# Patient Record
Sex: Male | Born: 1935 | Race: White | Hispanic: No | Smoking: Former smoker
Health system: Southern US, Community
[De-identification: ages and names within clinical notes are randomized; demographics above are authoritative.]

## PROBLEM LIST (undated history)

## (undated) DIAGNOSIS — G43909 Migraine, unspecified, not intractable, without status migrainosus: Secondary | ICD-10-CM

## (undated) DIAGNOSIS — I209 Angina pectoris, unspecified: Secondary | ICD-10-CM

## (undated) DIAGNOSIS — G2581 Restless legs syndrome: Secondary | ICD-10-CM

## (undated) DIAGNOSIS — H8109 Meniere's disease, unspecified ear: Secondary | ICD-10-CM

## (undated) DIAGNOSIS — I10 Essential (primary) hypertension: Secondary | ICD-10-CM

## (undated) DIAGNOSIS — I4819 Other persistent atrial fibrillation: Secondary | ICD-10-CM

## (undated) DIAGNOSIS — E119 Type 2 diabetes mellitus without complications: Secondary | ICD-10-CM

## (undated) DIAGNOSIS — M545 Low back pain, unspecified: Secondary | ICD-10-CM

## (undated) DIAGNOSIS — K279 Peptic ulcer, site unspecified, unspecified as acute or chronic, without hemorrhage or perforation: Secondary | ICD-10-CM

## (undated) DIAGNOSIS — I2510 Atherosclerotic heart disease of native coronary artery without angina pectoris: Secondary | ICD-10-CM

## (undated) DIAGNOSIS — I517 Cardiomegaly: Secondary | ICD-10-CM

## (undated) DIAGNOSIS — E669 Obesity, unspecified: Secondary | ICD-10-CM

## (undated) DIAGNOSIS — Z8701 Personal history of pneumonia (recurrent): Secondary | ICD-10-CM

## (undated) DIAGNOSIS — J45909 Unspecified asthma, uncomplicated: Secondary | ICD-10-CM

## (undated) DIAGNOSIS — G4733 Obstructive sleep apnea (adult) (pediatric): Secondary | ICD-10-CM

## (undated) DIAGNOSIS — E7800 Pure hypercholesterolemia, unspecified: Secondary | ICD-10-CM

## (undated) DIAGNOSIS — K219 Gastro-esophageal reflux disease without esophagitis: Secondary | ICD-10-CM

## (undated) DIAGNOSIS — I509 Heart failure, unspecified: Secondary | ICD-10-CM

## (undated) DIAGNOSIS — G8929 Other chronic pain: Secondary | ICD-10-CM

## (undated) DIAGNOSIS — Z8719 Personal history of other diseases of the digestive system: Secondary | ICD-10-CM

## (undated) DIAGNOSIS — N200 Calculus of kidney: Secondary | ICD-10-CM

## (undated) DIAGNOSIS — M1990 Unspecified osteoarthritis, unspecified site: Secondary | ICD-10-CM

## (undated) HISTORY — DX: Other persistent atrial fibrillation: I48.19

## (undated) HISTORY — DX: Cardiomegaly: I51.7

## (undated) HISTORY — PX: HEMORRHOID SURGERY: SHX153

## (undated) HISTORY — PX: INGUINAL HERNIA REPAIR: SUR1180

## (undated) HISTORY — DX: Obstructive sleep apnea (adult) (pediatric): G47.33

## (undated) HISTORY — DX: Restless legs syndrome: G25.81

## (undated) HISTORY — PX: ABLATION OF DYSRHYTHMIC FOCUS: SHX254

## (undated) HISTORY — DX: Obesity, unspecified: E66.9

## (undated) HISTORY — DX: Migraine, unspecified, not intractable, without status migrainosus: G43.909

---
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
## 1972-01-14 HISTORY — PX: KNEE ARTHROSCOPY WITH MENISCAL REPAIR: SHX5653

## 1981-01-13 HISTORY — PX: NISSEN FUNDOPLICATION: SHX2091

## 1981-01-13 HISTORY — PX: HIATAL HERNIA REPAIR: SHX195

## 1990-01-13 HISTORY — PX: WRIST FRACTURE SURGERY: SHX121

## 1999-08-19 HISTORY — PX: TOTAL KNEE ARTHROPLASTY: SHX125

## 2003-08-30 ENCOUNTER — Ambulatory Visit (HOSPITAL_BASED_OUTPATIENT_CLINIC_OR_DEPARTMENT_OTHER): Admission: RE | Admit: 2003-08-30 | Discharge: 2003-08-30 | Payer: Self-pay | Admitting: Surgery

## 2003-08-30 ENCOUNTER — Ambulatory Visit (HOSPITAL_COMMUNITY): Admission: RE | Admit: 2003-08-30 | Discharge: 2003-08-30 | Payer: Self-pay | Admitting: Surgery

## 2004-01-14 HISTORY — PX: COLONOSCOPY W/ POLYPECTOMY: SHX1380

## 2005-02-21 ENCOUNTER — Inpatient Hospital Stay (HOSPITAL_COMMUNITY): Admission: EM | Admit: 2005-02-21 | Discharge: 2005-03-03 | Payer: Self-pay | Admitting: Emergency Medicine

## 2007-01-14 HISTORY — PX: CATARACT EXTRACTION W/ INTRAOCULAR LENS  IMPLANT, BILATERAL: SHX1307

## 2011-09-29 ENCOUNTER — Other Ambulatory Visit: Payer: Self-pay | Admitting: Interventional Cardiology

## 2011-10-02 ENCOUNTER — Encounter (HOSPITAL_COMMUNITY)
Admission: RE | Disposition: A | Discharge status: Home / Self Care | Payer: Self-pay | Source: Ambulatory Visit | Attending: Interventional Cardiology

## 2011-10-02 ENCOUNTER — Encounter (HOSPITAL_COMMUNITY): Payer: Self-pay

## 2011-10-02 ENCOUNTER — Encounter (HOSPITAL_COMMUNITY): Payer: Self-pay | Admitting: Anesthesiology

## 2011-10-02 ENCOUNTER — Ambulatory Visit (HOSPITAL_COMMUNITY): Payer: Medicare HMO | Admitting: Anesthesiology

## 2011-10-02 ENCOUNTER — Ambulatory Visit (HOSPITAL_COMMUNITY)
Admission: RE | Admit: 2011-10-02 | Discharge: 2011-10-02 | Disposition: A | Discharge status: Home / Self Care | Payer: Medicare HMO | Source: Ambulatory Visit | Attending: Interventional Cardiology | Admitting: Interventional Cardiology

## 2011-10-02 DIAGNOSIS — I4891 Unspecified atrial fibrillation: Secondary | ICD-10-CM | POA: Insufficient documentation

## 2011-10-02 HISTORY — PX: CARDIOVERSION: SHX1299

## 2011-10-02 HISTORY — DX: Essential (primary) hypertension: I10

## 2011-10-02 SURGERY — CARDIOVERSION
Anesthesia: Monitor Anesthesia Care

## 2011-10-02 MED ORDER — SODIUM CHLORIDE 0.9 % IV SOLN
INTRAVENOUS | Status: DC
  Administered 2011-10-02: 11:00:00 via INTRAVENOUS

## 2011-10-02 MED ORDER — SODIUM CHLORIDE 0.9 % IV SOLN
INTRAVENOUS | Status: DC | PRN
  Administered 2011-10-02: 11:00:00 via INTRAVENOUS

## 2011-10-02 MED ORDER — PROPOFOL 10 MG/ML IV BOLUS
INTRAVENOUS | Status: DC | PRN
  Administered 2011-10-02: 40 mg via INTRAVENOUS

## 2011-10-02 MED ORDER — LIDOCAINE HCL (CARDIAC) 20 MG/ML IV SOLN
INTRAVENOUS | Status: DC | PRN
  Administered 2011-10-02: 50 mg via INTRAVENOUS

## 2011-10-02 NOTE — Transfer of Care (Signed)
Immediate Anesthesia Transfer of Care Note  Patient: Tim Short  Procedure(s) Performed: Procedure(s) (LRB) with comments: CARDIOVERSION (N/A) - h/p in file drawer  Patient Location: PACU  Anesthesia Type: General  Level of Consciousness: awake, alert  and patient cooperative  Airway & Oxygen Therapy: Patient Spontanous Breathing and Patient connected to face mask oxygen  Post-op Assessment: Report given to PACU RN, Post -op Vital signs reviewed and stable and Patient moving all extremities  Post vital signs: Reviewed and stable  Complications: No apparent anesthesia complications

## 2011-10-02 NOTE — CV Procedure (Signed)
Electrical Cardioversion Procedure Note Madden Garron [id number] 1934/06/16  Procedure: Electrical Cardioversion Indications:  Atrial Fibrillation  Time Out: Verified patient identification, verified procedure,medications/allergies/relevent history reviewed, required imaging and test results available.  Performed  Procedure Details  The patient was NPO after midnight. Anesthesia was administered at the beside  by Dr. Chaney Malling with 40mg  of propofol.  Cardioversion was done with synchronized biphasic defibrillation with AP pads with 120 J, and then 150 J.  The patient converted to normal sinus rhythm after the second shock. The patient tolerated the procedure well   IMPRESSION:  Successful cardioversion to NSR.  Continue Eliquis for at least a month and likely beyond.  Given sinus bradycardia, will hold propranolol post procedure.  Continue diltiazem.    VARANASI,JAYADEEP S. 10/02/2011, 12:31 PM

## 2011-10-02 NOTE — Anesthesia Preprocedure Evaluation (Addendum)
Anesthesia Evaluation  Patient identified by MRN, date of birth, ID band  Reviewed: Allergy & Precautions, H&P , NPO status , Patient's Chart, lab work & pertinent test results, reviewed documented beta blocker date and time   Airway Mallampati: II TM Distance: >3 FB Neck ROM: Full    Dental  (+) Partial Upper, Partial Lower and Missing   Pulmonary          Cardiovascular hypertension, Pt. on home beta blockers and Pt. on medications + dysrhythmias Atrial Fibrillation     Neuro/Psych    GI/Hepatic   Endo/Other    Renal/GU      Musculoskeletal   Abdominal   Peds  Hematology   Anesthesia Other Findings   Reproductive/Obstetrics                          Anesthesia Physical Anesthesia Plan  ASA: II  Anesthesia Plan: MAC   Post-op Pain Management:    Induction: Intravenous  Airway Management Planned: Mask  Additional Equipment:   Intra-op Plan:   Post-operative Plan:   Informed Consent: I have reviewed the patients History and Physical, chart, labs and discussed the procedure including the risks, benefits and alternatives for the proposed anesthesia with the patient or authorized representative who has indicated his/her understanding and acceptance.     Plan Discussed with: CRNA  Anesthesia Plan Comments:         Anesthesia Quick Evaluation

## 2011-10-02 NOTE — Preoperative (Signed)
Beta Blockers   Reason not to administer Beta Blockers:Inderal 9 a.m.today

## 2011-10-02 NOTE — H&P (Signed)
  Date of Initial H&P: 09/23/11  History reviewed, patient examined, no change in status, stable for cardioversion.

## 2011-10-03 ENCOUNTER — Encounter (HOSPITAL_COMMUNITY): Payer: Self-pay | Admitting: Interventional Cardiology

## 2011-10-03 NOTE — Anesthesia Postprocedure Evaluation (Signed)
Anesthesia Post Note  Patient: Tim Short  Procedure(s) Performed: Procedure(s) (LRB): CARDIOVERSION (N/A)  Anesthesia type: MAC  Patient location: PACU  Post pain: Pain level controlled and Adequate analgesia  Post assessment: Post-op Vital signs reviewed, Patient's Cardiovascular Status Stable and Respiratory Function Stable  Last Vitals:  Filed Vitals:   10/02/11 1410  BP: 103/60  Temp:   Resp: 16    Post vital signs: Reviewed and stable  Level of consciousness: awake, alert  and oriented  Complications: No apparent anesthesia complications

## 2011-11-05 ENCOUNTER — Other Ambulatory Visit: Payer: Self-pay | Admitting: Interventional Cardiology

## 2011-11-07 ENCOUNTER — Ambulatory Visit (HOSPITAL_COMMUNITY)
Admission: RE | Admit: 2011-11-07 | Discharge: 2011-11-07 | Disposition: A | Discharge status: Home / Self Care | Payer: Medicare HMO | Source: Ambulatory Visit | Attending: Interventional Cardiology | Admitting: Interventional Cardiology

## 2011-11-07 ENCOUNTER — Encounter (HOSPITAL_COMMUNITY): Payer: Self-pay | Admitting: *Deleted

## 2011-11-07 ENCOUNTER — Ambulatory Visit (HOSPITAL_COMMUNITY): Payer: Medicare HMO | Admitting: *Deleted

## 2011-11-07 ENCOUNTER — Encounter (HOSPITAL_COMMUNITY)
Admission: RE | Disposition: A | Discharge status: Home / Self Care | Payer: Self-pay | Source: Ambulatory Visit | Attending: Interventional Cardiology

## 2011-11-07 DIAGNOSIS — I10 Essential (primary) hypertension: Secondary | ICD-10-CM | POA: Insufficient documentation

## 2011-11-07 DIAGNOSIS — I4891 Unspecified atrial fibrillation: Secondary | ICD-10-CM

## 2011-11-07 HISTORY — DX: Pure hypercholesterolemia, unspecified: E78.00

## 2011-11-07 HISTORY — DX: Meniere's disease, unspecified ear: H81.09

## 2011-11-07 HISTORY — PX: CARDIOVERSION: SHX1299

## 2011-11-07 HISTORY — DX: Unspecified asthma, uncomplicated: J45.909

## 2011-11-07 SURGERY — CARDIOVERSION
Anesthesia: Monitor Anesthesia Care

## 2011-11-07 MED ORDER — AMIODARONE HCL 400 MG PO TABS: 400.0000 mg | ORAL_TABLET | Freq: Every day | ORAL | Status: DC

## 2011-11-07 MED ORDER — PROPOFOL 10 MG/ML IV BOLUS
INTRAVENOUS | Status: DC | PRN
  Administered 2011-11-07: 140 mg via INTRAVENOUS

## 2011-11-07 MED ORDER — SODIUM CHLORIDE 0.9 % IV SOLN
INTRAVENOUS | Status: DC | PRN
  Administered 2011-11-07: 11:00:00 via INTRAVENOUS

## 2011-11-07 MED ORDER — SODIUM CHLORIDE 0.9 % IV SOLN: INTRAVENOUS | Status: DC

## 2011-11-07 NOTE — Preoperative (Signed)
Beta Blockers   Reason not to administer Beta Blockers:Not Applicable 

## 2011-11-07 NOTE — Transfer of Care (Signed)
Immediate Anesthesia Transfer of Care Note  Patient: Tim Short  Procedure(s) Performed: Procedure(s) (LRB) with comments: CARDIOVERSION (N/A) - h/p from 10/22 in file drawer/dl  Patient Location: PACU and Endoscopy Unit  Anesthesia Type: General  Level of Consciousness: awake, alert  and oriented  Airway & Oxygen Therapy: Patient Spontanous Breathing and Patient connected to nasal cannula oxygen  Post-op Assessment: Report given to PACU RN  Post vital signs: Reviewed and stable  Complications: No apparent anesthesia complications

## 2011-11-07 NOTE — Anesthesia Preprocedure Evaluation (Addendum)
Anesthesia Evaluation  Patient identified by MRN, date of birth, ID band Patient awake    Reviewed: Allergy & Precautions, H&P , NPO status , Patient's Chart, lab work & pertinent test results  Airway Mallampati: II TM Distance: >3 FB Neck ROM: Full    Dental  (+) Teeth Intact,    Pulmonary asthma ,  breath sounds clear to auscultation        Cardiovascular hypertension, + dysrhythmias Atrial Fibrillation Rhythm:Irregular Rate:Abnormal  Pre op EKG reviewed--A fib @ 102; EKG 11/04/11   Neuro/Psych  Headaches, negative psych ROS   GI/Hepatic negative GI ROS, Neg liver ROS,   Endo/Other  negative endocrine ROS  Renal/GU negative Renal ROS     Musculoskeletal negative musculoskeletal ROS (+)   Abdominal Normal abdominal exam  (+)   Peds  Hematology negative hematology ROS (+)   Anesthesia Other Findings Pre op labs--CBC, PT/INR, BMET reviewed.  Samples drawn 11/04/11.  Reproductive/Obstetrics                          Anesthesia Physical Anesthesia Plan  ASA: III  Anesthesia Plan: General   Post-op Pain Management:    Induction: Intravenous  Airway Management Planned: Mask  Additional Equipment:   Intra-op Plan:   Post-operative Plan:   Informed Consent: I have reviewed the patients History and Physical, chart, labs and discussed the procedure including the risks, benefits and alternatives for the proposed anesthesia with the patient or authorized representative who has indicated his/her understanding and acceptance.   Dental advisory given  Plan Discussed with: CRNA, Anesthesiologist and Surgeon  Anesthesia Plan Comments:         Anesthesia Quick Evaluation

## 2011-11-07 NOTE — CV Procedure (Signed)
Electrical Cardioversion Procedure Note Tim Short [id number] 1934-10-22  Procedure: Electrical Cardioversion Indications:  Atrial Fibrillation  Time Out: Verified patient identification, verified procedure,medications/allergies/relevent history reviewed, required imaging and test results available.  Performed  Procedure Details  The patient was NPO after midnight. Anesthesia was administered at the beside  by Dr. Gelene Mink with 140 mg of propofol.  Cardioversion was done with synchronized biphasic defibrillation with AP pads with 120 Joules.  The patient converted to normal sinus rhythm. The patient tolerated the procedure well   IMPRESSION:  Successful cardioversion of atrial fibrillation.  Continue Eliquis for at least a month.    VARANASI,JAYADEEP S. 11/07/2011, 11:24 AM

## 2011-11-07 NOTE — H&P (Signed)
Date of Initial H&P: 11/04/11  History reviewed, patient examined, no change in status, stable for surgery.

## 2011-11-07 NOTE — Anesthesia Postprocedure Evaluation (Signed)
  Anesthesia Post-op Note  Patient: Tim Short  Procedure(s) Performed: Procedure(s) (LRB) with comments: CARDIOVERSION (N/A) - h/p from 10/22 in file drawer/dl  Patient Location: PACU  Anesthesia Type: General  Level of Consciousness: awake, alert  and oriented  Airway and Oxygen Therapy: Patient Spontanous Breathing and Patient connected to nasal cannula oxygen  Post-op Pain: none  Post-op Assessment: Post-op Vital signs reviewed  Post-op Vital Signs: Reviewed and stable  Complications: No apparent anesthesia complications

## 2011-11-07 NOTE — Progress Notes (Signed)
Cardioversion 150 joules at 11:22.  Converted to sinus rhythm.

## 2011-11-10 ENCOUNTER — Encounter (HOSPITAL_COMMUNITY): Payer: Self-pay | Admitting: Interventional Cardiology

## 2012-04-09 ENCOUNTER — Encounter: Payer: Self-pay | Admitting: Internal Medicine

## 2012-04-09 ENCOUNTER — Ambulatory Visit (INDEPENDENT_AMBULATORY_CARE_PROVIDER_SITE_OTHER): Payer: Medicare HMO | Admitting: Internal Medicine

## 2012-04-09 VITALS — BP 121/76 | HR 73 | Ht 70.0 in | Wt 241.2 lb

## 2012-04-09 DIAGNOSIS — G4733 Obstructive sleep apnea (adult) (pediatric): Secondary | ICD-10-CM

## 2012-04-09 DIAGNOSIS — I10 Essential (primary) hypertension: Secondary | ICD-10-CM

## 2012-04-09 DIAGNOSIS — I4891 Unspecified atrial fibrillation: Secondary | ICD-10-CM

## 2012-04-09 NOTE — Progress Notes (Signed)
Primary Care Physician: Daisy Floro, MD Referring Physician:  Dr Philis Kendall Onofre Tim Short is a 78 y.o. male with a h/o persistent atrial fibrillation who presents today for EP consultation.  He reports that he was initially diagnosed with atrial fibrillation in 2013 after presenting with progressive fatigue and weakness.  He underwent cardioversion 9/13.   He reports that after 3 days, he returned to atrial fibrillation.  He was placed on amiodarone and underwent repeat cardioversion 10/13.  He reports that he was back in afib within a week.  He has been on amiodarone since October but remains in afib.    Today, he denies symptoms of palpitations, chest pain, shortness of breath, orthopnea, PND,   presyncope, syncope, or neurologic sequela.  He has stable pedal edema.  He has dizziness which he attributes to meniere's disease.  The patient is tolerating medications without difficulties and is otherwise without complaint today.   Past Medical History  Diagnosis Date  . Hypertension   . Hypercholesteremia   . Asthma   . Meniere disease   . Persistent atrial fibrillation   . Obesity   . Biatrial enlargement     LA size 5.3cm  . Migraines   . Obstructive sleep apnea     uses CPAP   Past Surgical History  Procedure Laterality Date  . Wrist surgery  1992  . Knee surgery  2001  . Cataract extraction  2009  . Hernia repair  2000 and 2005  . Colonoscopy w/ polypectomy  2006  . Total knee arthroplasty  2001  . Cardioversion  10/02/2011    Procedure: CARDIOVERSION;  Surgeon: Corky Crafts, MD;  Location: [facility] ENDOSCOPY;  Service: Cardiovascular;  Laterality: N/A;  h/p in file drawer  . Cardioversion  11/07/2011    Procedure: CARDIOVERSION;  Surgeon: Corky Crafts, MD;  Location: [facility] ENDOSCOPY;  Service: Cardiovascular;  Laterality: N/A;  h/p from 10/22 in file drawer/dl    Current Outpatient Prescriptions  Medication Sig Dispense Refill  . acetaminophen (TYLENOL)  500 MG tablet Take 500 mg by mouth every 6 (six) hours as needed.      Marland Kitchen albuterol (PROVENTIL HFA;VENTOLIN HFA) 108 (90 BASE) MCG/ACT inhaler Inhale 2 puffs into the lungs every 6 (six) hours as needed.      Marland Kitchen amiodarone (PACERONE) 400 MG tablet Take 1 tablet (400 mg total) by mouth daily.      Marland Kitchen amitriptyline (ELAVIL) 50 MG tablet Take 50 mg by mouth at bedtime.      Marland Kitchen apixaban (ELIQUIS) 5 MG TABS tablet Take by mouth 2 (two) times daily.      Marland Kitchen atorvastatin (LIPITOR) 10 MG tablet Take 10 mg by mouth daily.      Marland Kitchen diltiazem (CARDIZEM CD) 180 MG 24 hr capsule Take 180 mg by mouth daily.      . meclizine (ANTIVERT) 25 MG tablet Take 25 mg by mouth 3 (three) times daily as needed.      . montelukast (SINGULAIR) 10 MG tablet Take 10 mg by mouth at bedtime.      . Potassium Chloride Crys CR (POTASSIUM CHLORIDE CRYS ER PO) Take by mouth.      Marland Kitchen rOPINIRole (REQUIP) 1 MG tablet Take 1 mg by mouth 2 times daily at 12 noon and 4 pm.      . losartan-hydrochlorothiazide (HYZAAR) 100-25 MG per tablet Take 1 tablet by mouth daily.       No current facility-administered medications for this  visit.    Allergies  Allergen Reactions  . Corticosteroids     Inhaled Corticosteroids--Hoarseness, dry mouth    History   Social History  . Marital Status: Divorced    Spouse Name: N/A    Number of Children: N/A  . Years of Education: N/A   Occupational History  . Not on file.   Social History Main Topics  . Smoking status: Former Smoker    Types: Pipe  . Smokeless tobacco: Former Neurosurgeon    Quit date: 10/16/1979  . Alcohol Use: No  . Drug Use: No  . Sexually Active:    Other Topics Concern  . Not on file   Social History Narrative   Lives in [place] alone.  Divorced.   Retired Oncologist in [place]    Family History  Problem Relation Age of Onset  . Congestive Heart Failure      ROS- All systems are reviewed and negative except as per the HPI above  Physical Exam: Filed  Vitals:   04/09/12 0857  BP: 121/76  Pulse: 73  Height: 5\' 10"  (1.778 m)  Weight: 241 lb 3.2 oz (109.408 kg)    GEN- The patient is well appearing, alert and oriented x 3 today.   Head- normocephalic, atraumatic Eyes-  Sclera clear, conjunctiva pink Ears- hearing intact Oropharynx- clear Neck- supple, no JVP Lymph- no cervical lymphadenopathy Lungs- Clear to ausculation bilaterally, normal work of breathing Heart- Regular rate and rhythm, no murmurs, rubs or gallops, PMI not laterally displaced GI- soft, NT, ND, + BS Extremities- no clubbing, cyanosis, or edema MS- no significant deformity or atrophy Skin- no rash or lesion Psych- euthymic mood, full affect Neuro- strength and sensation are intact  EKG today reveals afib, V rate 72 bpm, nonspecifc ST/ T changes Echo reveals LA size 53 mm  Assessment and Plan:

## 2012-04-09 NOTE — Patient Instructions (Addendum)
Will refer to [facility] to Dr Hurman Horn ----they will call you for your appointment

## 2012-04-10 DIAGNOSIS — G4733 Obstructive sleep apnea (adult) (pediatric): Secondary | ICD-10-CM | POA: Insufficient documentation

## 2012-04-10 DIAGNOSIS — I10 Essential (primary) hypertension: Secondary | ICD-10-CM | POA: Insufficient documentation

## 2012-04-10 NOTE — Assessment & Plan Note (Signed)
Compliance with CPAP is encouraged   

## 2012-04-10 NOTE — Assessment & Plan Note (Signed)
The patient has persistent atrial fibrillation with symptoms refractory to medical therapy.  He has severe LA enlargement.  Therapeutic strategies for afib including medicine and ablation were discussed in detail with the patient today. Given severe LA enlargement, I think that anticipated success rates from an endocardial ablation are low.  I would favor a more aggressive approach with either a surgical maze or convergent procedure.   After long discussion, he is willing to go to [facility] for consultation of the convergent procedure. I will therefore assist with this.

## 2012-04-10 NOTE — Assessment & Plan Note (Signed)
Stable No change required today  

## 2012-04-21 ENCOUNTER — Institutional Professional Consult (permissible substitution): Payer: Medicare HMO | Admitting: Internal Medicine

## 2012-05-13 ENCOUNTER — Telehealth: Payer: Self-pay | Admitting: Internal Medicine

## 2012-05-13 NOTE — Telephone Encounter (Signed)
lmom for T[facility] Endo to return my call

## 2012-05-13 NOTE — Telephone Encounter (Signed)
New Problem:    Called in requesting that Dr. Johney Frame perform a left heart Cath.  Please call back.

## 2012-05-13 NOTE — Telephone Encounter (Signed)
Dr Sidney Ace office will do the cath  [place] spoke with them

## 2012-05-17 ENCOUNTER — Other Ambulatory Visit: Payer: Self-pay | Admitting: Interventional Cardiology

## 2012-05-21 ENCOUNTER — Other Ambulatory Visit: Payer: Self-pay | Admitting: Cardiology

## 2012-05-21 NOTE — H&P (Signed)
JV/PRE CATH WORK-UP/SEE AMY.      HPI:     General:           Tim Tim Short is Tim Short 78 y/o male followed by Dr Eldridge Dace with Tim Short hx of two cardioversions , the second on Amiodarone with return of his Atrial fibrillation.He also has Tim Short hx of OSA and Hypertension. Dr Johney Frame has referred pt to Dr Prentice Docker at [facility]. Dr Hurman Horn plans at ths time to proceed with Tim Short transthoracic ablation and would like Dr Eldridge Dace to perform Tim Short cardiac catheterization prior to procedure. He states his Tim Short fib comes and goes and he does not have palpitations but can tell he is in the Tim Short fib by his pulse. Currently he feels regular..         Patient denies chest pain, palpitations, syncope, swelling, nor PND. He has chronic dizziness due to vertigo for many years. SOB occurs with exertion and any change in activity. He has hx of asthma that he feels is controlled currently,.     ROS:      as noted in HPI, no GI complaints, no signs of bleeding, no fever, chills nor congestion, no neurological changes.     Medical History: HTN, High Cholesterol, Migraines, severe OSA with AHI 108/hr now on CPAP at 12cm H2O.      Surgical History: R TKR 2001, Nissen 1983, Cataract surgery, B 2009, L hernia 2000, R hernia 2005, colonoscopy with polyp resection 12/2004.      Hospitalization/Major Diagnostic Procedure: nissen fundoplication 1983, left wrist fracture 1994, left inguinal herniorrhaphy 2000, right total knee replacement 2001, pneumonia 2002, right inguinal herniorrhaphy 2005, gastroenteritis with [place] virus 2007, Jan & Feb for cataract extractions 2009.      Family History:  Father: deceased, EmphysemaMother: deceasedBrother 1: CHF, CVABrother2: MI, CABG, CVABrother 3: StentSister 1: CHFSiblings: 5 brotherrs - one died of melanoma, one died of heart failure, 3 sisters - one died of breast cancer no early CAD. Brother with CAD. Negative GI Family History.     Social History:      General: History of smoking  cigarettes: Former smoker  Quit in 1983.  no Smoking, quit pipe . no Alcohol. no Recreational drug use. Diet: fruits and vegetables. Exercise: minimal, some walking. Occupation: retired. Marital Status: Divorced. Children: 1 son.     Gsonx1.     Medications: Taking Irbesartan 300 MG Tablet 1 tablet Once Tim Short day, Taking Diltiazem HCl CR 180 MG Capsule Extended Release 24 Hour 1 capsule every morning on an empty stomach Once Tim Short day, Taking Propranolol HCl CR 60 MG Capsule Extended Release 24 Hour 1 capsule Twice daily, Taking Tylenol Extra Strength 500 MG Tablet 1 tablet as needed every 6 hrs, Taking Ropinirole HCl 1 MG Tablet 1 tablet twice Tim Short day, Taking Atorvastatin Calcium 10 MG Tablet 1 tablet Once Tim Short day, Taking Albuterol 90 MCG/ACT Aerosol Solution 2 puffs every 4 hrs prn, Taking Meclizine HCl 25 MG Tablet 1 tablet three times Tim Short day as needed, Taking Amitriptyline HCl 50MG  Tablet 1 tablet at bedtime daily, Taking Klor-Con M20 20 MEQ Tablet Extended Release 1 tablet daily, Taking Furosemide 40 MG Tablet 1 tablet Once Tim Short day, Taking Eliquis 5 mg . Tablet 1 tablet twice Tim Short day, Taking Singulair 10 MG Tablet 1 tablet in the evening Once Tim Short day, Taking CPAP , Medication List reviewed and reconciled with the patient     Allergies: Inhaled Steroids: Hoarseness.        Vitals: Wt 245, Wt  change 5 lb, Ht 68.25, BMI 36.98, Pulse sitting 60, BP sitting 110/64.     Examination:     General Examination:         GENERAL APPEARANCE alert, oriented, NAD, pleasant. SKIN: normal, no rash. NECK: supple, no bruits, no JVD. LUNGS: clear to auscultation bilaterally, no wheezes, rhonchi, rales. HEART: regular, normal S1S2, 1/6 systolic murmur. ABDOMEN: soft and not tender, non-distended, + bowel sounds. EXTREMITIES: trace pretibial edema bilaterally. NEUROLOGIC EXAM: non-focal exam. PSYCH appropriate mood and affect .               Assessment:  1. Atrial fibrillation - 427.31 (Primary)   2. Essential hypertension, benign - 401.1   3. Obstructive  sleep apnea (adult) (pediatric) - 327.23       1. Atrial fibrillation  Continue Diltiazem HCl CR Capsule Extended Release 24 Hour, 180 MG, 1 capsule every morning on an empty stomach, Orally, Once Tim Short day ;  Continue Propranolol HCl CR Capsule Extended Release 24 Hour, 60 MG, 1 capsule, Orally, Twice daily ;  Continue Eliquis 5 mg Tablet, ., 1 tablet, orally, twice Tim Short day .         LAB: Basic Metabolic      LAB: CBC with Diff      LAB: PT and PTT ([id number]) Notes: with pt's plans for upcoming Atrial fibrillation ablation with Dr Hurman Horn, we will proceed with cardiac catheterization with Dr Eldridge Dace at the [facility] lab possilby by radial approach. Pt will hold his Eliquiss for 2 full days prior to procedure , Risks and benefits of cardiac catheterization have been reviewed including risk of stroke, heart attack, death, bleeding, renal impariment and arterial damage. There was ample oppurtuny to answer questions. Alternatives were discussed. Patient understands and wishes to proceed.       2. Essential hypertension, benign  Continue Klor-Con M20 Tablet Extended Release, 20 MEQ, 1 tablet, Orally, daily ;  Continue Furosemide Tablet, 40 MG, 1 tablet, Orally, Once Tim Short day .    Notes: with weight jup 5 lbs and slight increase swelling in ankels, for 1 day only pt will take an extra Lasix 40 mg in pm otherwise no change in therapy.          Procedures:      Venipuncture:          Venipuncture: Tim Short,Tim 05/20/2012 11:19:21 AM > , performed in right arm.             Labs:        Lab: Basic Metabolic       eGFR (AFRICAN AMERICAN) 73  >60 - calc       GLUCOSE 108 H 70-99 - mg/dL       BUN 22  0-45 - mg/dL       CREATININE 4.09  0.60-1.30 - mg/dl       eGFR (NON-AFRICAN AMERICAN) 61  >60 - calc       SODIUM 138  136-145 - mmol/L       POTASSIUM 4.2  3.5-5.5 - mmol/L       CHLORIDE 103  98-107 - mmol/L       C02 33 H 22-32 - mmol/L       ANION GAP 6.3  6.0-20.0 - mmol/L       CALCIUM 9.5  8.6-10.3 - mg/dL                  Tim Tim Short 05/20/2012 12:38:05 PM > ok for cath  Lab: CBC with Diff       WBC 10.1  4.0-11.0 - K/ul       RBC 4.47  4.20-5.80 - M/uL       HGB 14.1  13.0-17.0 - g/dL       HCT 16.1  09.6-04.5 - %       MCH 31.5  27.0-33.0 - pg       MPV 9.3  7.5-10.7 - fL       MCV 95.8 H 80.0-94.0 - fL       MCHC 32.9  32.0-36.0 - g/dL       RDW 40.9  81.1-91.4 - %       NRBC# 0.00  -        PLT 149 L 150-400 - K/uL       NEUT % 61.8  43.3-71.9 - %       NRBC% 0.00  - %       LYMPH% 24.8  16.8-43.5 - %       MONO % 9.8  4.6-12.4 - %       EOS % 2.6  0.0-7.8 - %       BASO % 1.0  0.0-1.0 - %       NEUT # 6.2  1.9-7.2 - K/uL       LYMPH# 2.50  1.10-2.70 - K/uL       MONO # 1.0 H 0.3-0.8 - K/uL       EOS # 0.3  0.0-0.6 - K/uL       BASO # 0.1  0.0-0.1 - K/uL                 Tim Tim Short 05/20/2012 10:39:48 AM > ok for cardiac cath               Lab: PT and PTT ([id number])       aPTT 28  24-33 - SEC       INR 1.1  0.8-1.2 -        Prothrombin Time 11.2  9.1-12.0 - SEC                 Tim Tim Short 05/21/2012 08:18:23 AM > ok           Procedure Codes: 21308 ECL BMP, 85025 ECL CBC PLATELET DIFF, 65784 BLOOD COLLECTION ROUTINE VENIPUNCTURE     Follow Up: JV pending procedure (Reason: S/P cardiac cath)           Provider: Michaell Cowing. Emelda Fear, NP  Patient: Tim, Tim Short  DOB: 1934/10/13  Date: 05/20/2012

## 2012-05-26 ENCOUNTER — Encounter (HOSPITAL_BASED_OUTPATIENT_CLINIC_OR_DEPARTMENT_OTHER): Payer: Self-pay | Admitting: Interventional Cardiology

## 2012-05-26 ENCOUNTER — Inpatient Hospital Stay (HOSPITAL_BASED_OUTPATIENT_CLINIC_OR_DEPARTMENT_OTHER)
Admission: RE | Admit: 2012-05-26 | Discharge: 2012-05-26 | Disposition: A | Discharge status: Home / Self Care | Payer: Medicare HMO | Source: Ambulatory Visit | Attending: Interventional Cardiology | Admitting: Interventional Cardiology

## 2012-05-26 ENCOUNTER — Encounter (HOSPITAL_BASED_OUTPATIENT_CLINIC_OR_DEPARTMENT_OTHER)
Admission: RE | Disposition: A | Discharge status: Home / Self Care | Payer: Self-pay | Source: Ambulatory Visit | Attending: Interventional Cardiology

## 2012-05-26 DIAGNOSIS — R0609 Other forms of dyspnea: Secondary | ICD-10-CM | POA: Insufficient documentation

## 2012-05-26 DIAGNOSIS — I2510 Atherosclerotic heart disease of native coronary artery without angina pectoris: Secondary | ICD-10-CM | POA: Insufficient documentation

## 2012-05-26 DIAGNOSIS — I4891 Unspecified atrial fibrillation: Secondary | ICD-10-CM

## 2012-05-26 DIAGNOSIS — R079 Chest pain, unspecified: Secondary | ICD-10-CM | POA: Insufficient documentation

## 2012-05-26 DIAGNOSIS — R0602 Shortness of breath: Secondary | ICD-10-CM

## 2012-05-26 DIAGNOSIS — R0989 Other specified symptoms and signs involving the circulatory and respiratory systems: Secondary | ICD-10-CM | POA: Insufficient documentation

## 2012-05-26 HISTORY — PX: CARDIAC CATHETERIZATION: SHX172

## 2012-05-26 SURGERY — JV LEFT HEART CATHETERIZATION WITH CORONARY ANGIOGRAM

## 2012-05-26 MED ORDER — SODIUM CHLORIDE 0.9 % IV SOLN: INTRAVENOUS | Status: DC

## 2012-05-26 MED ORDER — SODIUM CHLORIDE 0.9 % IV SOLN: 1.0000 mL/kg/h | INTRAVENOUS | Status: DC

## 2012-05-26 MED ORDER — APIXABAN 5 MG PO TABS: 5.0000 mg | ORAL_TABLET | Freq: Two times a day (BID) | ORAL | Status: DC

## 2012-05-26 MED ORDER — SODIUM CHLORIDE 0.9 % IJ SOLN: 3.0000 mL | INTRAMUSCULAR | Status: DC | PRN

## 2012-05-26 MED ORDER — ACETAMINOPHEN 325 MG PO TABS: 650.0000 mg | ORAL_TABLET | ORAL | Status: DC | PRN

## 2012-05-26 MED ORDER — SODIUM CHLORIDE 0.9 % IV SOLN: 250.0000 mL | INTRAVENOUS | Status: DC | PRN

## 2012-05-26 MED ORDER — ONDANSETRON HCL 4 MG/2ML IJ SOLN: 4.0000 mg | Freq: Four times a day (QID) | INTRAMUSCULAR | Status: DC | PRN

## 2012-05-26 MED ORDER — DIAZEPAM 5 MG PO TABS
5.0000 mg | ORAL_TABLET | ORAL | Status: AC
  Administered 2012-05-26: 5 mg via ORAL

## 2012-05-26 MED ORDER — ASPIRIN 81 MG PO CHEW
324.0000 mg | CHEWABLE_TABLET | ORAL | Status: AC
  Administered 2012-05-26: 324 mg via ORAL

## 2012-05-26 MED ORDER — SODIUM CHLORIDE 0.9 % IJ SOLN: 3.0000 mL | Freq: Two times a day (BID) | INTRAMUSCULAR | Status: DC

## 2012-05-26 NOTE — OR Nursing (Signed)
Meal served 

## 2012-05-26 NOTE — H&P (Signed)
Date of Initial H&P: 05/20/12  History reviewed, patient examined, no change in status, stable for surgery.

## 2012-05-26 NOTE — CV Procedure (Signed)
PROCEDURE:  Left heart catheterization with selective coronary angiography, left ventriculogram.  INDICATIONS:  Preprocedure for convergent procedure for atrial fibrillation, dyspnea on exertion, chest pain  The risks, benefits, and details of the procedure were explained to the patient.  The patient verbalized understanding and wanted to proceed.  Informed written consent was obtained.  PROCEDURE TECHNIQUE:  After Xylocaine anesthesia a 77F sheath was placed in the right radial artery with a single anterior needle wall stick.   Left coronary angiography was done using a Judkins L3.5 guide catheter.  Right coronary angiography was done using a Judkins R4 guide catheter.  Left ventriculography was done using a pigtail catheter.    CONTRAST:  Total of 80 cc.  COMPLICATIONS:  None.    HEMODYNAMICS:  Aortic pressure was 105/57; LV pressure was 102/12; LVEDP 16.  There was no gradient between the left ventricle and aorta.    ANGIOGRAPHIC DATA:   The left main coronary artery is widely patent.  The left anterior descending artery is a large vessel which reaches the apex.  The proximal to midportion, there is mild atherosclerosis.  There is a small first diagonal which is patent.  The left circumflex artery is a large vessel.  Just after the origin of the first marginal, there is a focal 80% stenosis.  There is an atrial branch which comes off in the middle of the stenosis.  A large first obtuse marginal has a moderate, ostial stenosis.  There is a second obtuse marginal which is small and patent.  The third obtuse marginal is large and appears free of disease.  The right coronary artery is a medium size, dominant vessel.  There is moderate atherosclerosis throughout the RCA.  The posterior lateral artery is small with mild diffuse disease.  The posterior descending artery is medium sized and patent.  LEFT VENTRICULOGRAM:  Left ventricular angiogram was done in the 30 RAO projection and revealed  normal left ventricular wall motion and systolic function with an estimated ejection fraction of 50 %.  LVEDP was 16 mmHg.  IMPRESSIONS:  1. Normal left main coronary artery. 2. No significant disease in the left anterior descending artery and its branches.  Mild mid vessel disease. 3. 80% lesion in the mid left circumflex artery, at the origin of the first obtuse marginal. 4. Widely patent right coronary artery. 5. Normal left ventricular systolic function.  LVEDP 16 mmHg.  Ejection fraction 50 %.  RECOMMENDATION:  Will discuss with the patient's electrophysiologist regarding intervention.  He was on two antianginal medications at the time.  Last night, he noted some chest discomfort.  We'll try to adjust medications to see if symptoms can be controlled.  Certainly his exertional shortness of breath, which has been a chronic problem, could be in part related to coronary artery disease.  In my prior conversation with Dr. Hurman Horn, he mentioned that a bare metal stent was preferable prior to convergent procedure, to avoid prolonged need for dual antiplatelet therapy.  I believe, the circumflex would be amenable to bare metal stenting.  It is relatively short lesion.  The vessel diameter appears to be more than 3.0 mm.

## 2012-05-26 NOTE — OR Nursing (Signed)
+  Allen's test right hand 

## 2012-05-26 NOTE — OR Nursing (Signed)
TR band removed, site level 0, pressure dressing and velcro splint applied, distal circulation intact, discharge instructions reviewed and signed, pt stated understanding, ambulated in hall without difficulty, transported to son's car via wheelchair

## 2012-06-02 ENCOUNTER — Other Ambulatory Visit: Payer: Self-pay | Admitting: Interventional Cardiology

## 2012-06-02 ENCOUNTER — Encounter (HOSPITAL_COMMUNITY): Payer: Self-pay | Admitting: Pharmacy Technician

## 2012-06-04 ENCOUNTER — Encounter (HOSPITAL_COMMUNITY)
Admission: RE | Disposition: A | Discharge status: Home / Self Care | Payer: Self-pay | Source: Ambulatory Visit | Attending: Interventional Cardiology

## 2012-06-04 ENCOUNTER — Ambulatory Visit (HOSPITAL_COMMUNITY)
Admission: RE | Admit: 2012-06-04 | Discharge: 2012-06-05 | Disposition: A | Discharge status: Home / Self Care | Payer: Medicare HMO | Source: Ambulatory Visit | Attending: Interventional Cardiology | Admitting: Interventional Cardiology

## 2012-06-04 ENCOUNTER — Encounter (HOSPITAL_COMMUNITY): Payer: Self-pay | Admitting: General Practice

## 2012-06-04 DIAGNOSIS — I2510 Atherosclerotic heart disease of native coronary artery without angina pectoris: Secondary | ICD-10-CM | POA: Insufficient documentation

## 2012-06-04 DIAGNOSIS — I209 Angina pectoris, unspecified: Secondary | ICD-10-CM | POA: Insufficient documentation

## 2012-06-04 DIAGNOSIS — R0602 Shortness of breath: Secondary | ICD-10-CM

## 2012-06-04 DIAGNOSIS — I10 Essential (primary) hypertension: Secondary | ICD-10-CM

## 2012-06-04 DIAGNOSIS — Z7901 Long term (current) use of anticoagulants: Secondary | ICD-10-CM | POA: Insufficient documentation

## 2012-06-04 DIAGNOSIS — I4891 Unspecified atrial fibrillation: Secondary | ICD-10-CM | POA: Insufficient documentation

## 2012-06-04 DIAGNOSIS — Z79899 Other long term (current) drug therapy: Secondary | ICD-10-CM | POA: Insufficient documentation

## 2012-06-04 HISTORY — PX: PERCUTANEOUS CORONARY STENT INTERVENTION (PCI-S): SHX5485

## 2012-06-04 HISTORY — DX: Low back pain, unspecified: M54.50

## 2012-06-04 HISTORY — DX: Gastro-esophageal reflux disease without esophagitis: K21.9

## 2012-06-04 HISTORY — DX: Angina pectoris, unspecified: I20.9

## 2012-06-04 HISTORY — DX: Unspecified osteoarthritis, unspecified site: M19.90

## 2012-06-04 HISTORY — DX: Other chronic pain: G89.29

## 2012-06-04 HISTORY — DX: Low back pain: M54.5

## 2012-06-04 HISTORY — PX: CORONARY ANGIOPLASTY WITH STENT PLACEMENT: SHX49

## 2012-06-04 HISTORY — DX: Personal history of other diseases of the digestive system: Z87.19

## 2012-06-04 HISTORY — DX: Peptic ulcer, site unspecified, unspecified as acute or chronic, without hemorrhage or perforation: K27.9

## 2012-06-04 SURGERY — PERCUTANEOUS CORONARY STENT INTERVENTION (PCI-S)
Anesthesia: LOCAL

## 2012-06-04 MED ORDER — LIDOCAINE HCL (PF) 1 % IJ SOLN
INTRAMUSCULAR | Status: AC
  Filled 2012-06-04: qty 30

## 2012-06-04 MED ORDER — MECLIZINE HCL 25 MG PO TABS
25.0000 mg | ORAL_TABLET | Freq: Three times a day (TID) | ORAL | Status: DC | PRN
  Filled 2012-06-04: qty 1

## 2012-06-04 MED ORDER — SODIUM CHLORIDE 0.9 % IV SOLN: 0.2500 mg/kg/h | INTRAVENOUS | Status: AC

## 2012-06-04 MED ORDER — ALBUTEROL SULFATE HFA 108 (90 BASE) MCG/ACT IN AERS
2.0000 | INHALATION_SPRAY | Freq: Four times a day (QID) | RESPIRATORY_TRACT | Status: DC | PRN
  Filled 2012-06-04: qty 6.7

## 2012-06-04 MED ORDER — AMITRIPTYLINE HCL 50 MG PO TABS
50.0000 mg | ORAL_TABLET | Freq: Every day | ORAL | Status: DC
  Administered 2012-06-04: 23:00:00 50 mg via ORAL
  Filled 2012-06-04 (×2): qty 1

## 2012-06-04 MED ORDER — ROPINIROLE HCL 1 MG PO TABS
1.0000 mg | ORAL_TABLET | Freq: Two times a day (BID) | ORAL | Status: DC
  Administered 2012-06-04 – 2012-06-05 (×2): 1 mg via ORAL
  Filled 2012-06-04 (×4): qty 1

## 2012-06-04 MED ORDER — TICAGRELOR 90 MG PO TABS
ORAL_TABLET | ORAL | Status: AC
  Filled 2012-06-04: qty 1

## 2012-06-04 MED ORDER — SODIUM CHLORIDE 0.9 % IJ SOLN: 3.0000 mL | INTRAMUSCULAR | Status: DC | PRN

## 2012-06-04 MED ORDER — SODIUM CHLORIDE 0.9 % IV SOLN
1.0000 mL/kg/h | INTRAVENOUS | Status: AC
  Administered 2012-06-04: 14:00:00 1 mL/kg/h via INTRAVENOUS

## 2012-06-04 MED ORDER — DIAZEPAM 5 MG PO TABS
5.0000 mg | ORAL_TABLET | ORAL | Status: AC
  Administered 2012-06-04: 5 mg via ORAL
  Filled 2012-06-04: qty 1

## 2012-06-04 MED ORDER — AMIODARONE HCL 200 MG PO TABS
400.0000 mg | ORAL_TABLET | Freq: Every day | ORAL | Status: DC
  Filled 2012-06-04: qty 2

## 2012-06-04 MED ORDER — FENTANYL CITRATE 0.05 MG/ML IJ SOLN
INTRAMUSCULAR | Status: AC
  Filled 2012-06-04: qty 2

## 2012-06-04 MED ORDER — DILTIAZEM HCL ER COATED BEADS 180 MG PO CP24
180.0000 mg | ORAL_CAPSULE | Freq: Every day | ORAL | Status: DC
  Administered 2012-06-05: 180 mg via ORAL
  Filled 2012-06-04: qty 1

## 2012-06-04 MED ORDER — VERAPAMIL HCL 2.5 MG/ML IV SOLN
INTRAVENOUS | Status: AC
  Filled 2012-06-04: qty 2

## 2012-06-04 MED ORDER — HEPARIN SODIUM (PORCINE) 1000 UNIT/ML IJ SOLN
INTRAMUSCULAR | Status: AC
  Filled 2012-06-04: qty 1

## 2012-06-04 MED ORDER — CLOPIDOGREL BISULFATE 75 MG PO TABS
75.0000 mg | ORAL_TABLET | Freq: Every day | ORAL | Status: DC
  Administered 2012-06-05: 10:00:00 75 mg via ORAL

## 2012-06-04 MED ORDER — MIDAZOLAM HCL 2 MG/2ML IJ SOLN
INTRAMUSCULAR | Status: AC
  Filled 2012-06-04: qty 2

## 2012-06-04 MED ORDER — SODIUM CHLORIDE 0.9 % IJ SOLN: 3.0000 mL | Freq: Two times a day (BID) | INTRAMUSCULAR | Status: DC

## 2012-06-04 MED ORDER — ACETAMINOPHEN 325 MG PO TABS: 650.0000 mg | ORAL_TABLET | ORAL | Status: DC | PRN

## 2012-06-04 MED ORDER — SODIUM CHLORIDE 0.9 % IV SOLN
INTRAVENOUS | Status: DC
  Administered 2012-06-04: 10:00:00 via INTRAVENOUS

## 2012-06-04 MED ORDER — MONTELUKAST SODIUM 10 MG PO TABS
10.0000 mg | ORAL_TABLET | Freq: Every day | ORAL | Status: DC
  Administered 2012-06-04: 10 mg via ORAL
  Filled 2012-06-04 (×2): qty 1

## 2012-06-04 MED ORDER — SODIUM CHLORIDE 0.9 % IV SOLN: 250.0000 mL | INTRAVENOUS | Status: DC | PRN

## 2012-06-04 MED ORDER — HEPARIN (PORCINE) IN NACL 2-0.9 UNIT/ML-% IJ SOLN
INTRAMUSCULAR | Status: AC
  Filled 2012-06-04: qty 1000

## 2012-06-04 MED ORDER — ONDANSETRON HCL 4 MG/2ML IJ SOLN: 4.0000 mg | Freq: Four times a day (QID) | INTRAMUSCULAR | Status: DC | PRN

## 2012-06-04 MED ORDER — PROPRANOLOL HCL 60 MG PO TABS
60.0000 mg | ORAL_TABLET | Freq: Two times a day (BID) | ORAL | Status: DC
  Administered 2012-06-04 – 2012-06-05 (×2): 60 mg via ORAL
  Filled 2012-06-04 (×4): qty 1

## 2012-06-04 MED ORDER — ASPIRIN 81 MG PO CHEW
324.0000 mg | CHEWABLE_TABLET | ORAL | Status: AC
  Administered 2012-06-04: 324 mg via ORAL
  Filled 2012-06-04: qty 4

## 2012-06-04 MED ORDER — ASPIRIN 81 MG PO CHEW
81.0000 mg | CHEWABLE_TABLET | Freq: Every day | ORAL | Status: DC
  Administered 2012-06-05: 10:00:00 81 mg via ORAL
  Filled 2012-06-04: qty 1

## 2012-06-04 MED ORDER — POTASSIUM CHLORIDE CRYS ER 20 MEQ PO TBCR
20.0000 meq | EXTENDED_RELEASE_TABLET | Freq: Every day | ORAL | Status: DC
  Administered 2012-06-04 – 2012-06-05 (×2): 20 meq via ORAL
  Filled 2012-06-04: qty 1

## 2012-06-04 MED ORDER — BIVALIRUDIN 250 MG IV SOLR
INTRAVENOUS | Status: AC
  Filled 2012-06-04: qty 250

## 2012-06-04 MED ORDER — SODIUM CHLORIDE 0.9 % IV SOLN
0.2500 mg/kg/h | INTRAVENOUS | Status: DC
  Filled 2012-06-04: qty 250

## 2012-06-04 MED ORDER — NITROGLYCERIN 0.4 MG SL SUBL: 0.4000 mg | SUBLINGUAL_TABLET | SUBLINGUAL | Status: DC | PRN

## 2012-06-04 MED ORDER — ATORVASTATIN CALCIUM 10 MG PO TABS
10.0000 mg | ORAL_TABLET | Freq: Every day | ORAL | Status: DC
  Administered 2012-06-04: 10 mg via ORAL
  Filled 2012-06-04 (×2): qty 1

## 2012-06-04 NOTE — Progress Notes (Signed)
TR BAND REMOVAL  LOCATION:    right radial  DEFLATED PER PROTOCOL:    yes  TIME BAND OFF / DRESSING APPLIED:    1900   SITE UPON ARRIVAL:    Level 0  SITE AFTER BAND REMOVAL:    Level 0  REVERSE ALLEN'S TEST:     positive  CIRCULATION SENSATION AND MOVEMENT:    Within Normal Limits   yes  COMMENTS:   Tolerated procedure procedure

## 2012-06-04 NOTE — Progress Notes (Signed)
Utilization Review Completed Rankin Coolman J. Lirio Bach, RN, BSN, NCM [phone]  

## 2012-06-04 NOTE — H&P (Signed)
Date of Initial H&P: 05/20/12  History reviewed, patient examined, no change in status, stable for PCI.  He has been on 2 antianginal meds and has continued to have chest tightness, with exertion and sometimes at rest.  In addition, he will be having a convergent procedure at [facility] for AFib.  Due to the risk of hypotension during that procedure, they would like him to have angioplasty of his circumflex to prevent ischemia during the hypotension.  Will plan bare metal stent so they can perform procedure in 6 weeks.  He and his son are aware of the plan.

## 2012-06-04 NOTE — CV Procedure (Signed)
PROCEDURE:  PCI Left circumflex  INDICATIONS:  Persistent angina despite 2  Antianginal meds  The risks, benefits, and details of the procedure were explained to the patient.  The patient verbalized understanding and wanted to proceed.  Informed written consent was obtained.  PROCEDURE TECHNIQUE:  After Xylocaine anesthesia a 35F sheath was placed in the right radial artery with a single anterior needle wall stick.   Left coronary angiography was done using a CLS 3.5 guiding catheter.  Angiomax was used for anticoagulation.  ACT was used to check that the Angiomax is therapeutic.  A TR band was placed for hemostasis.   CONTRAST:  Total of 150 cc.  COMPLICATIONS:  None.        ANGIOGRAPHIC DATA:   The left main coronary artery is widely patent.  The left circumflex artery has a focal 90% stenosis after the origin of a large OM1.Marland Kitchen  PCI NARRATIVE: A CLS 3.0 guiding catheter was initially used to engage the left main.  A pro-water wire was placed across the area of disease in the circumflex.  A 2.5 x 10 cutting balloon was used to predilate the disease in the circumflex.  There was some stenosis in the OM1 noted.  A BMW wire was placed into the OM1.  A 3.0 x 15 integrity, bare metal stent was deployed.  The BMW wire was then replaced into the OM through the stent struts.  Multiple attempts were made to get a 2.0 x 12 balloon into the OM.  This was unsuccessful.  The guide did not seem to be very supportive.  The guide was switched out for a CLS 3.5 guiding catheter.  A 2.0 x 12 balloon still would not advance across the stent struts.  The wire was replaced several times but there was no success getting the balloon to the ostium of the OM1.  Finally, a 1.5 x 12 sprinter balloon was successfully advanced.  This was inflated to 14 atmospheres.  A 2.0 x 12 lm was advanced into the circumflex stent.  The balloons were inflated simultaneously.  There is an excellent angiographic result.  There was TIMI-3  flow noted in both the OM 1 and circumflex.  Nitroglycerin was administered.   IMPRESSIONS:  1. Normal left main coronary artery. 2. Normal left anterior descending artery and its branches. 3. Successful PCI of the mid left circumflex artery and OM1.  3.0 x 15 Integrity stent post dilated to 3.5 mm in diameter.  1.5 balloon to the ostium of the jailed OM1.  Final kissing balloons.   RECOMMENDATION:  Dual antiplatelet therapy for at least a month.  Convergent procedure at [facility] in about 6 weeks.  Will start Coumadin in a few days. Will likely have him on Coumadin and Plavix without aspirin for the next few weeks.  He will resume Eliquis after coming off of Plavix.

## 2012-06-05 LAB — BASIC METABOLIC PANEL
BUN: 13 mg/dL (ref 6–23)
Calcium: 9.1 mg/dL (ref 8.4–10.5)
GFR calc Af Amer: >90 mL/min (ref >90)
GFR calc non Af Amer: 81 mL/min — ABNORMAL LOW (ref >90)
Glucose, Bld: 131 mg/dL — ABNORMAL HIGH (ref 70–99)
Potassium: 3.5 mEq/L (ref 3.5–5.1)
Sodium: 140 mEq/L (ref 135–145)

## 2012-06-05 LAB — CBC
HCT: 45.6 % (ref 39.0–52.0)
Hemoglobin: 15.1 g/dL (ref 13.0–17.0)
MCH: 31.9 pg (ref 26.0–34.0)
MCHC: 33.1 g/dL (ref 30.0–36.0)
RDW: 14.1 % (ref 11.5–15.5)

## 2012-06-05 MED ORDER — CLOPIDOGREL BISULFATE 75 MG PO TABS: 75.0000 mg | ORAL_TABLET | Freq: Every day | ORAL | Status: DC

## 2012-06-05 MED ORDER — WARFARIN SODIUM 5 MG PO TABS: 5.0000 mg | ORAL_TABLET | Freq: Every day | ORAL | Status: DC

## 2012-06-05 MED ORDER — WARFARIN - PHYSICIAN DOSING INPATIENT: Freq: Every day | Status: DC

## 2012-06-05 MED ORDER — ASPIRIN 81 MG PO CHEW: 81.0000 mg | CHEWABLE_TABLET | Freq: Every day | ORAL | Status: AC

## 2012-06-05 NOTE — Progress Notes (Signed)
CARDIAC REHAB PHASE I   PRE:  Rate/Rhythm: 73 afib  BP:  Supine: 124/91     SaO2: 96 2L  94 RA  MODE:  Ambulation: 500 ft   POST:  Rate/Rhythem: 91 afib  BP:  Sitting: 145/110   SaO2: 95 RA  Order received and appreciated.  Chart reviewed.  Pt ambulated 500 ft with assist x1.  Pt took one standing rest break and tolerated walk without complaint. Reviewed d/c education: stent/Plavix, chest pain, calling MD/911, restrictions, diet and exercise.  Also discussed Cardiac Rehab Phase II with pt request to have info sent here to [facility].  Pt voiced understanding. Fabio Pierce, MA, ACSM RCEP [phone]   Hazle Nordmann

## 2012-06-05 NOTE — Discharge Summary (Signed)
Patient ID: Tim Short MRN: [id number] DOB/AGE: 1934-02-10 78 y.o.  Admit date: 06/04/2012 Discharge date: 06/05/2012  Primary Discharge Diagnosis coronary artery disease Secondary Discharge Diagnosis atrial fibrillation  Significant Diagnostic Studies: angiography: Cardiac cath with a bare metal stent placement to the mid circumflex, 3.0 x 15 integrity, postdilated to 3.5 mm.  Consults: None  Hospital Course: 78 y/o man who had a successful angioplasty.  It involved a bifurcationa nd a final kissing balloon was performed.  He had no further chest pain.  He had some SHOB with Brilinta but this resolved.  Due to his AFib, he was to start COumadin and stop aspirin for stroke prevention.  IN the future, he will go back on Eliquis.  He will f/u at [facility] with Dr. Hurman Horn as well.  No bleeding problems.  He did well with walking  With cardiac rehab.   Discharge Exam: Blood pressure 138/79, pulse 84, temperature 97.9 F (36.6 C), temperature source Oral, resp. rate 18, height 5\' 10"  (1.778 m), weight 114.3 kg (251 lb 15.8 oz), SpO2 95.00%.   [place]/AT Irregulary irregular, rate controlled. Obese 2+ right radial pulse, no hematoma No edema Labs:   Lab Results  Component Value Date   WBC 11.3* 06/05/2012   HGB 15.1 06/05/2012   HCT 45.6 06/05/2012   MCV 96.2 06/05/2012   PLT 160 06/05/2012    Recent Labs Lab 06/05/12 0532  NA 140  K 3.5  CL 101  CO2 26  BUN 13  CREATININE 0.89  CALCIUM 9.1  GLUCOSE 131*   No results found for this basename: CKTOTAL, CKMB, CKMBINDEX, TROPONINI    No results found for this basename: CHOL   No results found for this basename: HDL   No results found for this basename: LDLCALC   No results found for this basename: TRIG   No results found for this basename: CHOLHDL   No results found for this basename: LDLDIRECT      Radiology: EKG: AFib, rate controlled, NSST  FOLLOW UP PLANS AND APPOINTMENTS    Medication List    STOP taking these  medications       amiodarone 400 MG tablet  Commonly known as:  PACERONE     apixaban 5 MG Tabs tablet  Commonly known as:  ELIQUIS      TAKE these medications       acetaminophen 500 MG tablet  Commonly known as:  TYLENOL  Take 500 mg by mouth every 6 (six) hours as needed for pain or fever.     albuterol 108 (90 BASE) MCG/ACT inhaler  Commonly known as:  PROVENTIL HFA;VENTOLIN HFA  Inhale 2 puffs into the lungs every 6 (six) hours as needed for wheezing or shortness of breath.     amitriptyline 50 MG tablet  Commonly known as:  ELAVIL  Take 50 mg by mouth at bedtime.     aspirin 81 MG chewable tablet  Chew 1 tablet (81 mg total) by mouth daily.     atorvastatin 10 MG tablet  Commonly known as:  LIPITOR  Take 10 mg by mouth daily.     clopidogrel 75 MG tablet  Commonly known as:  PLAVIX  Take 1 tablet (75 mg total) by mouth daily with breakfast.     diltiazem 180 MG 24 hr capsule  Commonly known as:  CARDIZEM CD  Take 180 mg by mouth daily.     furosemide 40 MG tablet  Commonly known as:  LASIX  Take 40 mg  by mouth daily.     meclizine 25 MG tablet  Commonly known as:  ANTIVERT  Take 25 mg by mouth 3 (three) times daily as needed for dizziness.     montelukast 10 MG tablet  Commonly known as:  SINGULAIR  Take 10 mg by mouth at bedtime.     nitroGLYCERIN 0.4 MG SL tablet  Commonly known as:  NITROSTAT  Place 0.4 mg under the tongue every 5 (five) minutes as needed for chest pain.     potassium chloride SA 20 MEQ tablet  Commonly known as:  K-DUR,KLOR-CON  Take 20 mEq by mouth daily.     propranolol 60 MG tablet  Commonly known as:  INDERAL  Take 60 mg by mouth 2 (two) times daily.     rOPINIRole 1 MG tablet  Commonly known as:  REQUIP  Take 1 mg by mouth 2 times daily at 12 noon and 4 pm.     warfarin 5 MG tablet  Commonly known as:  COUMADIN  Take 1 tablet (5 mg total) by mouth daily at 6 PM.  Start taking on:  06/06/2012           Follow-up  Information   Follow up with Corky Crafts., MD. (Office to call next Tuesday for coumadin check)    Contact information:   [address] [place] [address] [phone]       BRING ALL MEDICATIONS WITH YOU TO FOLLOW UP APPOINTMENTS  Time spent with patient to include physician time:25 minutes Signed: Brigg Cape S. 06/05/2012, 7:55 AM

## 2012-06-08 MED FILL — Sodium Chloride IV Soln 0.9%: INTRAVENOUS | Qty: 50 | Status: AC

## 2012-06-24 ENCOUNTER — Encounter (HOSPITAL_COMMUNITY)
Admission: RE | Admit: 2012-06-24 | Discharge: 2012-06-24 | Disposition: A | Discharge status: Home / Self Care | Payer: Medicare HMO | Source: Ambulatory Visit | Attending: Interventional Cardiology | Admitting: Interventional Cardiology

## 2012-06-24 DIAGNOSIS — I4891 Unspecified atrial fibrillation: Secondary | ICD-10-CM | POA: Insufficient documentation

## 2012-06-24 DIAGNOSIS — Z5189 Encounter for other specified aftercare: Secondary | ICD-10-CM | POA: Insufficient documentation

## 2012-06-24 DIAGNOSIS — I2510 Atherosclerotic heart disease of native coronary artery without angina pectoris: Secondary | ICD-10-CM | POA: Insufficient documentation

## 2012-06-24 DIAGNOSIS — Z7901 Long term (current) use of anticoagulants: Secondary | ICD-10-CM | POA: Insufficient documentation

## 2012-06-24 NOTE — Progress Notes (Signed)
Cardiac Rehab Medication Review by a Pharmacist  Does the patient  feel that his/her medications are working for him/her?  yes  Has the patient been experiencing any side effects to the medications prescribed?  no  Does the patient measure his/her own blood pressure or blood glucose at home?  yes , daily  Does the patient have any problems obtaining medications due to transportation or finances?   no  Understanding of regimen: good Understanding of indications: good Potential of compliance: good  Pharmacist comments: Patients medication list was reviewed for accuracy, indications, adverse effects, and compliance. Any questions were addressed at this time. No current issues per patient.   Abran Duke, PharmD Clinical Pharmacist Phone: [phone] Pager: [phone] 06/24/2012 8:54 AM

## 2012-06-28 ENCOUNTER — Encounter (HOSPITAL_COMMUNITY)
Admission: RE | Admit: 2012-06-28 | Discharge: 2012-06-28 | Disposition: A | Discharge status: Home / Self Care | Payer: Medicare HMO | Source: Ambulatory Visit | Attending: Interventional Cardiology | Admitting: Interventional Cardiology

## 2012-06-28 ENCOUNTER — Encounter (HOSPITAL_COMMUNITY): Payer: Self-pay

## 2012-06-28 NOTE — Progress Notes (Signed)
Pt started cardiac rehab today.  Pt tolerated light exercise without difficulty.  VSS, asymptomatic.  Atrial fibrillation.  Pt admits to frequent SL NTG use.  Pt states he felt this "anxious" this am and used NTG x1 with immediate relief.  Pt denies pain with light exercise today.  Dr. Eldridge Dace made aware of pt frequent NTG use.  Pt instructed when to call MD and when to call 911.  Pt oriented to exercise equipment and routine.  Understanding verbalized.  PHQ-zero.

## 2012-06-30 ENCOUNTER — Encounter (HOSPITAL_COMMUNITY)
Admission: RE | Admit: 2012-06-30 | Discharge: 2012-06-30 | Disposition: A | Discharge status: Home / Self Care | Payer: Medicare HMO | Source: Ambulatory Visit | Attending: Interventional Cardiology | Admitting: Interventional Cardiology

## 2012-07-02 ENCOUNTER — Encounter (HOSPITAL_COMMUNITY)
Admission: RE | Admit: 2012-07-02 | Discharge: 2012-07-02 | Disposition: A | Discharge status: Home / Self Care | Payer: Medicare HMO | Source: Ambulatory Visit | Attending: Interventional Cardiology | Admitting: Interventional Cardiology

## 2012-07-05 ENCOUNTER — Encounter (HOSPITAL_COMMUNITY)
Admission: RE | Admit: 2012-07-05 | Discharge: 2012-07-05 | Disposition: A | Discharge status: Home / Self Care | Payer: Medicare HMO | Source: Ambulatory Visit | Attending: Interventional Cardiology | Admitting: Interventional Cardiology

## 2012-07-05 NOTE — Progress Notes (Signed)
Reviewed home exercise with pt today.  Pt plans to walk and use stationary bike at home for exercise.  Reviewed THR, pulse, RPE, sign and symptoms, NTG use, and when to call 911 or MD.  Pt voiced understanding. [place] Welcome, MA, ACSM RCEP  

## 2012-07-07 ENCOUNTER — Encounter (HOSPITAL_COMMUNITY)
Admission: RE | Admit: 2012-07-07 | Discharge: 2012-07-07 | Disposition: A | Discharge status: Home / Self Care | Payer: Medicare HMO | Source: Ambulatory Visit | Attending: Interventional Cardiology | Admitting: Interventional Cardiology

## 2012-07-09 ENCOUNTER — Encounter (HOSPITAL_COMMUNITY)
Admission: RE | Admit: 2012-07-09 | Discharge: 2012-07-09 | Disposition: A | Discharge status: Home / Self Care | Payer: Medicare HMO | Source: Ambulatory Visit | Attending: Interventional Cardiology | Admitting: Interventional Cardiology

## 2012-07-09 NOTE — Progress Notes (Addendum)
Pt arrived at cardiac rehab c/o vertigo symptoms.  Pt states he awoke with symptoms today.  Pt describes as room spinning. BP-134/64. Pt did not exercise.   Pt states his current meclizine rx is expired.  Phone call to pt PCP Dr. Tenny Craw to request refill to [facility].  Pt  Called friend to drive him home.

## 2012-07-12 ENCOUNTER — Encounter (HOSPITAL_COMMUNITY)
Admission: RE | Admit: 2012-07-12 | Discharge: 2012-07-12 | Disposition: A | Discharge status: Home / Self Care | Payer: Medicare HMO | Source: Ambulatory Visit | Attending: Interventional Cardiology | Admitting: Interventional Cardiology

## 2012-07-14 ENCOUNTER — Encounter (HOSPITAL_COMMUNITY)
Admission: RE | Admit: 2012-07-14 | Discharge: 2012-07-14 | Disposition: A | Discharge status: Home / Self Care | Payer: Medicare HMO | Source: Ambulatory Visit | Attending: Interventional Cardiology | Admitting: Interventional Cardiology

## 2012-07-14 DIAGNOSIS — I2510 Atherosclerotic heart disease of native coronary artery without angina pectoris: Secondary | ICD-10-CM | POA: Insufficient documentation

## 2012-07-14 DIAGNOSIS — I4891 Unspecified atrial fibrillation: Secondary | ICD-10-CM | POA: Insufficient documentation

## 2012-07-14 DIAGNOSIS — Z7901 Long term (current) use of anticoagulants: Secondary | ICD-10-CM | POA: Insufficient documentation

## 2012-07-14 DIAGNOSIS — Z5189 Encounter for other specified aftercare: Secondary | ICD-10-CM | POA: Insufficient documentation

## 2012-07-16 ENCOUNTER — Encounter (HOSPITAL_COMMUNITY): Payer: Medicare HMO

## 2012-07-19 ENCOUNTER — Encounter (HOSPITAL_COMMUNITY)
Admission: RE | Admit: 2012-07-19 | Discharge: 2012-07-19 | Disposition: A | Discharge status: Home / Self Care | Payer: Medicare HMO | Source: Ambulatory Visit | Attending: Interventional Cardiology | Admitting: Interventional Cardiology

## 2012-07-21 ENCOUNTER — Encounter (HOSPITAL_COMMUNITY)
Admission: RE | Admit: 2012-07-21 | Discharge: 2012-07-21 | Disposition: A | Discharge status: Home / Self Care | Payer: Medicare HMO | Source: Ambulatory Visit | Attending: Interventional Cardiology | Admitting: Interventional Cardiology

## 2012-07-23 ENCOUNTER — Encounter (HOSPITAL_COMMUNITY)
Admission: RE | Admit: 2012-07-23 | Discharge: 2012-07-23 | Disposition: A | Discharge status: Home / Self Care | Payer: Medicare HMO | Source: Ambulatory Visit | Attending: Interventional Cardiology | Admitting: Interventional Cardiology

## 2012-07-26 ENCOUNTER — Encounter (HOSPITAL_COMMUNITY)
Admission: RE | Admit: 2012-07-26 | Discharge: 2012-07-26 | Disposition: A | Discharge status: Home / Self Care | Payer: Medicare HMO | Source: Ambulatory Visit | Attending: Interventional Cardiology | Admitting: Interventional Cardiology

## 2012-07-26 NOTE — Progress Notes (Signed)
Tim Short 78 y.o. male Nutrition Note Spoke with pt.  Nutrition Plan and Nutrition Survey goals reviewed with pt. Pt is following Step 1  of the Therapeutic Lifestyle Changes diet. Pt wants to lose wt. Pt has been trying to lose wt by "exercising at rehab and trying to eat less." Pt lives by himself and cooks most meals. Pt states, "I try not to make too much at meals because I don't eat leftovers." Wt loss tips reviewed. Pt reports he is off of Coumadin now. Per nutrition screen, pt said he had financial difficulty buying food. After discussion with pt, pt states, "I'm just tight (with my money)." Pt expressed understanding of the information reviewed. Pt aware of nutrition education classes offered and is unable to attend nutrition classes.  Nutrition Diagnosis   Food-and nutrition-related knowledge deficit related to lack of exposure to information as related to diagnosis of: ? CVD    Obesity related to excessive energy intake as evidenced by a BMI of 36.2 Nutrition RX/ Estimated Daily Nutrition Needs for: wt loss  1600-2100 Kcal, 40-55 gm fat, 10-14 gm sat fat, 1.5-2.1 gm trans-fat, <1500 mg sodium  Nutrition Intervention   Pt's individual nutrition plan including cholesterol goals reviewed with pt.   Benefits of adopting Therapeutic Lifestyle Changes discussed when Medficts reviewed.   Pt to attend the Portion Distortion class   Pt given handouts for: ? Nutrition I class ? Nutrition II class ? 5-day, 1800 kcal menu ideas   Continue client-centered nutrition education by RD, as part of interdisciplinary care.  Goal(s)   Pt to identify and limit food sources of saturated fat, trans fat, and cholesterol   Pt to identify food quantities necessary to achieve: ? wt loss to a goal wt of 220-238 lb (100.1-108.3 kg) at graduation from cardiac rehab.   Monitor and Evaluate progress toward nutrition goal with team. Nutrition Risk:  Low   [name], M.Ed, RD, LDN, CDE 07/26/2012 9:18 AM

## 2012-07-28 ENCOUNTER — Encounter (HOSPITAL_COMMUNITY)
Admission: RE | Admit: 2012-07-28 | Discharge: 2012-07-28 | Disposition: A | Discharge status: Home / Self Care | Payer: Medicare HMO | Source: Ambulatory Visit | Attending: Interventional Cardiology | Admitting: Interventional Cardiology

## 2012-07-30 ENCOUNTER — Encounter (HOSPITAL_COMMUNITY)
Admission: RE | Admit: 2012-07-30 | Discharge: 2012-07-30 | Disposition: A | Discharge status: Home / Self Care | Payer: Medicare HMO | Source: Ambulatory Visit | Attending: Interventional Cardiology | Admitting: Interventional Cardiology

## 2012-08-02 ENCOUNTER — Encounter (HOSPITAL_COMMUNITY)
Admission: RE | Admit: 2012-08-02 | Discharge: 2012-08-02 | Disposition: A | Discharge status: Home / Self Care | Payer: Medicare HMO | Source: Ambulatory Visit | Attending: Interventional Cardiology | Admitting: Interventional Cardiology

## 2012-08-04 ENCOUNTER — Encounter (HOSPITAL_COMMUNITY)
Admission: RE | Admit: 2012-08-04 | Discharge: 2012-08-04 | Disposition: A | Discharge status: Home / Self Care | Payer: Medicare HMO | Source: Ambulatory Visit | Attending: Interventional Cardiology | Admitting: Interventional Cardiology

## 2012-08-06 ENCOUNTER — Encounter (HOSPITAL_COMMUNITY)
Admission: RE | Admit: 2012-08-06 | Discharge: 2012-08-06 | Disposition: A | Discharge status: Home / Self Care | Payer: Medicare HMO | Source: Ambulatory Visit | Attending: Interventional Cardiology | Admitting: Interventional Cardiology

## 2012-08-09 ENCOUNTER — Encounter (HOSPITAL_COMMUNITY)
Admission: RE | Admit: 2012-08-09 | Discharge: 2012-08-09 | Disposition: A | Discharge status: Home / Self Care | Payer: Medicare HMO | Source: Ambulatory Visit | Attending: Interventional Cardiology | Admitting: Interventional Cardiology

## 2012-08-09 NOTE — Progress Notes (Signed)
Pt weight up at cardiac rehab 2.2 lb today at cardiac rehab. Pt reports mild shortness of breath usual for him, however denies edema.  Pt lungs clear, O2 sat-94% on room air. Pt reports he did not take diuretic this am. Pt advised to take diuretic upon arrival home. Will continue to monitor

## 2012-08-11 ENCOUNTER — Encounter (HOSPITAL_COMMUNITY)
Admission: RE | Admit: 2012-08-11 | Discharge: 2012-08-11 | Disposition: A | Discharge status: Home / Self Care | Payer: Medicare HMO | Source: Ambulatory Visit | Attending: Interventional Cardiology | Admitting: Interventional Cardiology

## 2012-08-13 ENCOUNTER — Encounter (HOSPITAL_COMMUNITY)
Admission: RE | Admit: 2012-08-13 | Discharge: 2012-08-13 | Disposition: A | Discharge status: Home / Self Care | Payer: Medicare HMO | Source: Ambulatory Visit | Attending: Interventional Cardiology | Admitting: Interventional Cardiology

## 2012-08-13 DIAGNOSIS — I2510 Atherosclerotic heart disease of native coronary artery without angina pectoris: Secondary | ICD-10-CM | POA: Insufficient documentation

## 2012-08-13 DIAGNOSIS — Z7901 Long term (current) use of anticoagulants: Secondary | ICD-10-CM | POA: Insufficient documentation

## 2012-08-13 DIAGNOSIS — I4891 Unspecified atrial fibrillation: Secondary | ICD-10-CM | POA: Insufficient documentation

## 2012-08-13 DIAGNOSIS — Z5189 Encounter for other specified aftercare: Secondary | ICD-10-CM | POA: Insufficient documentation

## 2012-08-16 ENCOUNTER — Encounter (HOSPITAL_COMMUNITY)
Admission: RE | Admit: 2012-08-16 | Discharge: 2012-08-16 | Disposition: A | Discharge status: Home / Self Care | Payer: Medicare HMO | Source: Ambulatory Visit | Attending: Interventional Cardiology | Admitting: Interventional Cardiology

## 2012-08-18 ENCOUNTER — Encounter (HOSPITAL_COMMUNITY)
Admission: RE | Admit: 2012-08-18 | Discharge: 2012-08-18 | Disposition: A | Discharge status: Home / Self Care | Payer: Medicare HMO | Source: Ambulatory Visit | Attending: Interventional Cardiology | Admitting: Interventional Cardiology

## 2012-08-20 ENCOUNTER — Encounter (HOSPITAL_COMMUNITY)
Admission: RE | Admit: 2012-08-20 | Discharge: 2012-08-20 | Disposition: A | Discharge status: Home / Self Care | Payer: Medicare HMO | Source: Ambulatory Visit | Attending: Interventional Cardiology | Admitting: Interventional Cardiology

## 2012-08-23 ENCOUNTER — Encounter (HOSPITAL_COMMUNITY)
Admission: RE | Admit: 2012-08-23 | Discharge: 2012-08-23 | Disposition: A | Discharge status: Home / Self Care | Payer: Medicare HMO | Source: Ambulatory Visit | Attending: Interventional Cardiology | Admitting: Interventional Cardiology

## 2012-08-25 ENCOUNTER — Encounter (HOSPITAL_COMMUNITY): Payer: Medicare HMO

## 2012-08-27 ENCOUNTER — Encounter (HOSPITAL_COMMUNITY)
Admission: RE | Admit: 2012-08-27 | Discharge: 2012-08-27 | Disposition: A | Discharge status: Home / Self Care | Payer: Medicare HMO | Source: Ambulatory Visit | Attending: Interventional Cardiology | Admitting: Interventional Cardiology

## 2012-08-30 ENCOUNTER — Encounter (HOSPITAL_COMMUNITY): Payer: Medicare HMO

## 2012-09-01 ENCOUNTER — Encounter (HOSPITAL_COMMUNITY)
Admission: RE | Admit: 2012-09-01 | Discharge: 2012-09-01 | Disposition: A | Discharge status: Home / Self Care | Payer: Medicare HMO | Source: Ambulatory Visit | Attending: Interventional Cardiology | Admitting: Interventional Cardiology

## 2012-09-03 ENCOUNTER — Encounter (HOSPITAL_COMMUNITY)
Admission: RE | Admit: 2012-09-03 | Discharge: 2012-09-03 | Disposition: A | Discharge status: Home / Self Care | Payer: Medicare HMO | Source: Ambulatory Visit | Attending: Interventional Cardiology | Admitting: Interventional Cardiology

## 2012-09-06 ENCOUNTER — Encounter (HOSPITAL_COMMUNITY)
Admission: RE | Admit: 2012-09-06 | Discharge: 2012-09-06 | Disposition: A | Discharge status: Home / Self Care | Payer: Medicare HMO | Source: Ambulatory Visit | Attending: Interventional Cardiology | Admitting: Interventional Cardiology

## 2012-09-08 ENCOUNTER — Encounter (HOSPITAL_COMMUNITY): Payer: Medicare HMO

## 2012-09-10 ENCOUNTER — Encounter (HOSPITAL_COMMUNITY): Payer: Medicare HMO

## 2012-09-13 ENCOUNTER — Encounter (HOSPITAL_COMMUNITY): Payer: Medicare HMO

## 2012-09-15 ENCOUNTER — Encounter (HOSPITAL_COMMUNITY): Payer: Medicare HMO

## 2012-09-15 ENCOUNTER — Encounter (HOSPITAL_COMMUNITY)
Admission: RE | Admit: 2012-09-15 | Discharge: 2012-09-15 | Disposition: A | Discharge status: Home / Self Care | Payer: Medicare HMO | Source: Ambulatory Visit | Attending: Interventional Cardiology | Admitting: Interventional Cardiology

## 2012-09-15 DIAGNOSIS — I2510 Atherosclerotic heart disease of native coronary artery without angina pectoris: Secondary | ICD-10-CM | POA: Insufficient documentation

## 2012-09-15 DIAGNOSIS — Z7901 Long term (current) use of anticoagulants: Secondary | ICD-10-CM | POA: Insufficient documentation

## 2012-09-15 DIAGNOSIS — I4891 Unspecified atrial fibrillation: Secondary | ICD-10-CM | POA: Insufficient documentation

## 2012-09-15 DIAGNOSIS — Z5189 Encounter for other specified aftercare: Secondary | ICD-10-CM | POA: Insufficient documentation

## 2012-09-15 NOTE — Progress Notes (Signed)
At cardiac rehab 8:15am class Patient reported he did not feel well. He said he had been feeling fine until he got out of car to walk into building. He shared that the heat bothers him.  Discussed not exercising today and he agreed.  BP 132/60 and HR 69 a-fib.

## 2012-09-17 ENCOUNTER — Encounter (HOSPITAL_COMMUNITY)
Admission: RE | Admit: 2012-09-17 | Discharge: 2012-09-17 | Disposition: A | Discharge status: Home / Self Care | Payer: Medicare HMO | Source: Ambulatory Visit | Attending: Interventional Cardiology | Admitting: Interventional Cardiology

## 2012-09-17 ENCOUNTER — Encounter (HOSPITAL_COMMUNITY): Payer: Medicare HMO

## 2012-09-20 ENCOUNTER — Encounter (HOSPITAL_COMMUNITY): Payer: Medicare HMO

## 2012-09-22 ENCOUNTER — Encounter (HOSPITAL_COMMUNITY)
Admission: RE | Admit: 2012-09-22 | Discharge: 2012-09-22 | Disposition: A | Discharge status: Home / Self Care | Payer: Medicare HMO | Source: Ambulatory Visit | Attending: Interventional Cardiology | Admitting: Interventional Cardiology

## 2012-09-24 ENCOUNTER — Encounter (HOSPITAL_COMMUNITY)
Admission: RE | Admit: 2012-09-24 | Discharge: 2012-09-24 | Disposition: A | Discharge status: Home / Self Care | Payer: Medicare HMO | Source: Ambulatory Visit | Attending: Interventional Cardiology | Admitting: Interventional Cardiology

## 2012-09-27 ENCOUNTER — Encounter (HOSPITAL_COMMUNITY)
Admission: RE | Admit: 2012-09-27 | Discharge: 2012-09-27 | Disposition: A | Discharge status: Home / Self Care | Payer: Medicare HMO | Source: Ambulatory Visit | Attending: Interventional Cardiology | Admitting: Interventional Cardiology

## 2012-09-29 ENCOUNTER — Encounter (HOSPITAL_COMMUNITY)
Admission: RE | Admit: 2012-09-29 | Discharge: 2012-09-29 | Disposition: A | Discharge status: Home / Self Care | Payer: Medicare HMO | Source: Ambulatory Visit | Attending: Interventional Cardiology | Admitting: Interventional Cardiology

## 2012-09-29 ENCOUNTER — Encounter (HOSPITAL_COMMUNITY): Payer: Self-pay

## 2012-09-29 NOTE — Progress Notes (Signed)
Pt graduated from cardiac rehab program today.  Medication list reconciled.  PHQ9 score- 0 .  Pt reports he has had significant increase in quality of life with his only concern at this time continued angina.  Pt reports he has discussed with cardiologist.  The angina does not occur with exercise. Pt reports he has used NTG SL 3-4 times over past 5 months. Pt states usually angina relieved with position change or rest.   Pt has made significant lifestyle changes and should be commended for his success. Pt plans to continue exercise in [employer] program.

## 2012-10-01 ENCOUNTER — Encounter (HOSPITAL_COMMUNITY): Payer: Medicare HMO

## 2012-10-04 ENCOUNTER — Encounter (HOSPITAL_COMMUNITY): Payer: Medicare HMO

## 2012-10-06 ENCOUNTER — Encounter (HOSPITAL_COMMUNITY): Payer: Medicare HMO

## 2012-10-08 ENCOUNTER — Encounter (HOSPITAL_COMMUNITY): Payer: Medicare HMO

## 2012-10-08 HISTORY — PX: COLON SURGERY: SHX602

## 2012-10-11 ENCOUNTER — Encounter (HOSPITAL_COMMUNITY): Payer: Medicare HMO

## 2012-10-13 ENCOUNTER — Encounter (HOSPITAL_COMMUNITY): Payer: Medicare HMO

## 2012-10-13 HISTORY — PX: TRACHEOSTOMY: SUR1362

## 2012-10-15 ENCOUNTER — Encounter (HOSPITAL_COMMUNITY): Payer: Medicare HMO

## 2012-10-18 ENCOUNTER — Encounter (HOSPITAL_COMMUNITY): Payer: Medicare HMO

## 2012-10-20 ENCOUNTER — Encounter (HOSPITAL_COMMUNITY): Payer: Medicare HMO

## 2012-10-22 ENCOUNTER — Encounter (HOSPITAL_COMMUNITY): Payer: Medicare HMO

## 2012-10-25 ENCOUNTER — Encounter (HOSPITAL_COMMUNITY): Payer: Medicare HMO

## 2012-10-27 ENCOUNTER — Encounter (HOSPITAL_COMMUNITY): Payer: Medicare HMO

## 2012-10-27 ENCOUNTER — Encounter: Payer: Self-pay | Admitting: *Deleted

## 2012-10-27 ENCOUNTER — Encounter: Payer: Self-pay | Admitting: Cardiology

## 2012-10-29 ENCOUNTER — Encounter (HOSPITAL_COMMUNITY): Payer: Medicare HMO

## 2012-10-31 ENCOUNTER — Encounter: Payer: Self-pay | Admitting: Cardiology

## 2012-10-31 DIAGNOSIS — I2510 Atherosclerotic heart disease of native coronary artery without angina pectoris: Secondary | ICD-10-CM | POA: Insufficient documentation

## 2012-10-31 DIAGNOSIS — E785 Hyperlipidemia, unspecified: Secondary | ICD-10-CM | POA: Insufficient documentation

## 2012-11-01 ENCOUNTER — Encounter: Payer: Medicare HMO | Admitting: Cardiology

## 2012-11-02 NOTE — Progress Notes (Signed)
This encounter was created in error - please disregard.

## 2012-12-07 ENCOUNTER — Ambulatory Visit: Payer: Medicare HMO | Admitting: Interventional Cardiology

## 2012-12-10 ENCOUNTER — Encounter: Payer: Self-pay | Admitting: Interventional Cardiology

## 2012-12-14 ENCOUNTER — Inpatient Hospital Stay
Admission: AD | Admit: 2012-12-14 | Discharge: 2013-02-02 | Disposition: A | Discharge status: Skilled Nursing Facility | Payer: Medicare HMO | Source: Ambulatory Visit | Attending: Internal Medicine | Admitting: Internal Medicine

## 2012-12-14 ENCOUNTER — Other Ambulatory Visit (HOSPITAL_COMMUNITY): Payer: Self-pay

## 2012-12-14 DIAGNOSIS — R633 Feeding difficulties, unspecified: Secondary | ICD-10-CM

## 2012-12-14 DIAGNOSIS — R079 Chest pain, unspecified: Secondary | ICD-10-CM

## 2012-12-14 DIAGNOSIS — Z930 Tracheostomy status: Secondary | ICD-10-CM

## 2012-12-14 DIAGNOSIS — E785 Hyperlipidemia, unspecified: Secondary | ICD-10-CM

## 2012-12-14 DIAGNOSIS — I4891 Unspecified atrial fibrillation: Secondary | ICD-10-CM

## 2012-12-14 DIAGNOSIS — G4733 Obstructive sleep apnea (adult) (pediatric): Secondary | ICD-10-CM

## 2012-12-14 DIAGNOSIS — J9620 Acute and chronic respiratory failure, unspecified whether with hypoxia or hypercapnia: Secondary | ICD-10-CM

## 2012-12-14 DIAGNOSIS — I2510 Atherosclerotic heart disease of native coronary artery without angina pectoris: Secondary | ICD-10-CM

## 2012-12-14 HISTORY — DX: Type 2 diabetes mellitus without complications: E11.9

## 2012-12-15 ENCOUNTER — Encounter: Payer: Self-pay | Admitting: Radiology

## 2012-12-15 ENCOUNTER — Institutional Professional Consult (permissible substitution) (HOSPITAL_COMMUNITY): Payer: Self-pay

## 2012-12-15 ENCOUNTER — Other Ambulatory Visit (HOSPITAL_COMMUNITY): Payer: Self-pay

## 2012-12-15 DIAGNOSIS — G4733 Obstructive sleep apnea (adult) (pediatric): Secondary | ICD-10-CM

## 2012-12-15 DIAGNOSIS — Z930 Tracheostomy status: Secondary | ICD-10-CM

## 2012-12-15 DIAGNOSIS — J9620 Acute and chronic respiratory failure, unspecified whether with hypoxia or hypercapnia: Secondary | ICD-10-CM

## 2012-12-15 LAB — BLOOD GAS, ARTERIAL
Acid-Base Excess: 11.6 mmol/L — ABNORMAL HIGH (ref 0.0–2.0)
TCO2: 37.5 mmol/L (ref 0–100)
pCO2 arterial: 50.1 mmHg — ABNORMAL HIGH (ref 35.0–45.0)

## 2012-12-15 LAB — CBC WITH DIFFERENTIAL/PLATELET
Basophils Relative: 1 % (ref 0–1)
Eosinophils Absolute: 0.1 10*3/uL (ref 0.0–0.7)
HCT: 28.8 % — ABNORMAL LOW (ref 39.0–52.0)
Hemoglobin: 8.6 g/dL — ABNORMAL LOW (ref 13.0–17.0)
MCH: 30.3 pg (ref 26.0–34.0)
MCHC: 29.9 g/dL — ABNORMAL LOW (ref 30.0–36.0)
Monocytes Absolute: 0.7 10*3/uL (ref 0.1–1.0)
Monocytes Relative: 7 % (ref 3–12)
Neutrophils Relative %: 64 % (ref 43–77)

## 2012-12-15 LAB — PREALBUMIN: Prealbumin: 10.6 mg/dL — ABNORMAL LOW (ref 17.0–34.0)

## 2012-12-15 LAB — COMPREHENSIVE METABOLIC PANEL
Albumin: 2.3 g/dL — ABNORMAL LOW (ref 3.5–5.2)
BUN: 27 mg/dL — ABNORMAL HIGH (ref 6–23)
Creatinine, Ser: 0.71 mg/dL (ref 0.50–1.35)
Total Protein: 5.6 g/dL — ABNORMAL LOW (ref 6.0–8.3)

## 2012-12-15 LAB — PROCALCITONIN: Procalcitonin: <0.1 ng/mL

## 2012-12-15 MED ORDER — IOHEXOL 300 MG/ML  SOLN: 25.0000 mL | INTRAMUSCULAR | Status: AC

## 2012-12-15 MED ORDER — IOHEXOL 300 MG/ML  SOLN
80.0000 mL | Freq: Once | INTRAMUSCULAR | Status: AC | PRN
  Administered 2012-12-15: 80 mL via INTRAVENOUS

## 2012-12-15 NOTE — Consult Note (Signed)
Patient seen and examined, agree with above note.  I dictated the care and orders written for this patient under my direction.  Wesam G Yacoub, MD [phone] 

## 2012-12-15 NOTE — Consult Note (Addendum)
PULMONARY  / CRITICAL CARE MEDICINE  Name: Tim Short MRN: [id number] DOB: [birth year]    ADMISSION DATE:  12/14/2012 CONSULTATION DATE:  12/2  REFERRING MD :  Sharyon Medicus PRIMARY SERVICE:  ssh  CHIEF COMPLAINT:  Vent weaning    BRIEF PATIENT DESCRIPTION:  This is a 78 year old male who was admitted to [facility] initially back in sept for elective, but unsuccessful MAZE procedure. 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 His respiratory status was complicated by MSSA PNA and recurrent hypercarbic respiratory failure (required re-intubation X 2) and eventually trach placement. He was transferred to [facility] for vent weaning.    SIGNIFICANT EVENTS / STUDIES:   LINES / TUBES: Trach 10/17>>> RUE picc (at [facility])>>>  CULTURES: MSSA sputum (at [facility])   ANTIBIOTICS: naf (started at [facility])>>>stop date planned for 12/13  HISTORY OF PRESENT ILLNESS: see abpve   PAST MEDICAL HISTORY :  Past Medical History  Diagnosis Date  . Hypertension   . Hypercholesteremia   . Asthma   . Meniere disease   . Persistent atrial fibrillation   . Obesity   . Biatrial enlargement     LA size 5.3cm  . Obstructive sleep apnea     AHI 108/hr now on CPAP at 12cm H2O  . Chest pain, unspecified     "just recently" (07-04-12)  . H/O hiatal hernia   . Shortness of breath     "associated w/asthma & AF; mostly w/exertion" (07-04-12)  . GERD (gastroesophageal reflux disease)     "associated w/hiatal hernia" (07-04-2012)  . Peptic ulcer 1950's  . Migraines     "haven't had one for about 15 years" (2012-07-04)  . Arthritis     "joints" (2012/07/04)  . Chronic lower back pain   . Kidney stones ~ 1960's    "passed on their own" (07-04-12)  . Pneumonia 2002; 2006    "spent 8 days in isolation; Norovirus" (04-Jul-2012)  . Other and unspecified angina pectoris   . RLS  (restless legs syndrome)    Past Surgical History  Procedure Laterality Date  . Wrist fracture surgery  1992    "repaired w/left hip bone graft" (2012-07-04)  . Total knee arthroplasty  08/19/1999  . Colonoscopy w/ polypectomy  2006  . Knee arthroscopy with meniscal repair Right 1974    "medial meniscus repaired" (07/04/2012)  . Cardioversion  10/02/2011    Procedure: CARDIOVERSION;  Surgeon: Corky Crafts, MD;  Location: [facility] ENDOSCOPY;  Service: Cardiovascular;  Laterality: N/A;  h/p in file drawer  . Cardioversion  11/07/2011    Procedure: CARDIOVERSION;  Surgeon: Corky Crafts, MD;  Location: [facility] ENDOSCOPY;  Service: Cardiovascular;  Laterality: N/A;  h/p from 10/22 in file drawer/dl  . Cardiac catheterization  05/26/2012  . Coronary angioplasty with stent placement  07/04/12    "1" (2012/07/04)  . Inguinal hernia repair Bilateral 2000 and 2005    "one done at a time" (2012-07-04)  . Cataract extraction w/ intraocular lens  implant, bilateral  2009  . Hemorrhoid surgery  ~ 2003  . Hiatal hernia repair  1983  . Nissen fundoplication  1983   Prior to Admission medications   Medication Sig Start Date End Date Taking? Authorizing Provider  acetaminophen (TYLENOL) 500 MG tablet Take 500 mg by mouth every 6 (six) hours as needed for pain or fever.     Historical Provider, MD  albuterol (PROVENTIL HFA;VENTOLIN HFA) 108 (90 BASE) MCG/ACT inhaler Inhale 2 puffs into the lungs every 6 (six) hours as needed for wheezing or shortness of breath.     Historical Provider, MD  amitriptyline (ELAVIL) 50 MG tablet Take 50 mg by mouth at bedtime.    Historical Provider, MD  apixaban (ELIQUIS) 5 MG TABS tablet Take 5 mg by mouth 2 (two) times daily.    Historical Provider, MD  atorvastatin (LIPITOR) 10 MG tablet Take 10 mg by mouth daily.    Historical Provider, MD  clopidogrel (PLAVIX) 75 MG tablet Take 1 tablet (75 mg total) by mouth daily with breakfast. 06/05/12   Everette Rank, MD  diltiazem  (CARDIZEM CD) 180 MG 24 hr capsule Take 180 mg by mouth daily.    Historical Provider, MD  furosemide (LASIX) 40 MG tablet Take 40 mg by mouth daily.    Historical Provider, MD  irbesartan (AVAPRO) 300 MG tablet Take 300 mg by mouth at bedtime.    Historical Provider, MD  meclizine (ANTIVERT) 25 MG tablet Take 25 mg by mouth 3 (three) times daily as needed for dizziness.     Historical Provider, MD  montelukast (SINGULAIR) 10 MG tablet Take 10 mg by mouth at bedtime.    Historical Provider, MD  nitroGLYCERIN (NITROSTAT) 0.4 MG SL tablet Place 0.4 mg under the tongue every 5 (five) minutes as needed for chest pain.    Historical Provider, MD  potassium chloride SA (K-DUR,KLOR-CON) 20 MEQ tablet Take 20 mEq by mouth daily.    Historical Provider, MD  propranolol (INDERAL) 60 MG tablet Take 60 mg by mouth 2 (two) times daily.    Historical Provider, MD  rOPINIRole (REQUIP) 1 MG tablet Take 1 mg by mouth every 12 (twelve) hours.     Historical Provider, MD   Allergies  Allergen Reactions  . Corticosteroids     Inhaled Corticosteroids--Hoarseness, dry mouth    FAMILY HISTORY:  Family History  Problem Relation Age of Onset  . Congestive Heart Failure    . COPD Father   . Heart failure Sister   . Heart failure Brother   . CVA Brother   . Heart attack Brother   . Heart disease Brother   . CVA Brother   . Heart disease Brother    SOCIAL HISTORY:  reports that he quit smoking about 31 years ago. His smoking use included Pipe. He has never used smokeless tobacco. He reports that he does not drink alcohol or use illicit drugs.  REVIEW OF SYSTEMS:   Unable   SUBJECTIVE:  Appears comfortable  VITAL SIGNS:    PHYSICAL EXAMINATION: General:  Awake, alert, follows commands Neuro:  No focal def  HEENT:  Trach unremarkable Cardiovascular:  rrr Lungs:  Clear, vt 400s on PSV of 12  Abdomen:  Soft, drain intact, ileostomy fistula draining.  Musculoskeletal:  Intact  Skin:  Mild  edema   Recent Labs Lab 12/15/12 0500  NA 144  K 3.1*  CL 101  CO2 36*  BUN 27*  CREATININE 0.71  GLUCOSE 134*    Recent Labs Lab 12/15/12 0500  HGB 8.6*  HCT 28.8*  WBC 10.5  PLT 217   Dg Chest Port 1 View  12/15/2012   CLINICAL DATA:  Respiratory failure  EXAM: PORTABLE CHEST - 1 VIEW  COMPARISON:  None.  FINDINGS: Tracheostomy is well seated. Central catheter tip is in the superior vena cava region. Nasogastric tube tip and side port extend below the diaphragm.  There is a tubular structure either in or overlying the left lung base. No pneumothorax.  There is cardiomegaly with normal pulmonary vascularity. No adenopathy. There are small pleural effusions bilaterally.  IMPRESSION: Tube and catheter positions as described without pneumothorax. Findings felt to represent a degree of congestive heart failure. No airspace consolidation appreciable.   Electronically Signed   By: Bretta Bang M.D.   On: 12/15/2012 07:57   Dg Abd Portable 1v  12/14/2012   CLINICAL DATA:  Nasogastric tube placement.  EXAM: PORTABLE ABDOMEN - 1 VIEW  COMPARISON:  Chest film of 02/23/2005  FINDINGS: Nasogastric terminates at the body of the stomach. Cardiomegaly with probable small bilateral pleural effusions. No gross bowel obstruction. Gas within colon and upper normal sized mid small bowel.  IMPRESSION: Nasogastric tube terminating at the body of the stomach.   Electronically Signed   By: Jeronimo Greaves M.D.   On: 12/14/2012 22:06    ASSESSMENT / PLAN:  Acute respiratory failure  Failure to wean Tracheostomy status Pulmonary edema.  MSSA PNA H/o OSA He looks comfortable on PSV of 12. Has element of volume overload. Not sure how much narcotics and decreased abd compliance playing a role here.   Recommendation -continue diuresis, would add lasix for a day or two to achieve negative fluid balance as BP and BUN/creatine allow -Begin weaning trials -limit narcotics to least effective dose.  - f/u  CXR 12/5 - oxacillin until 12/13  H/o paroxysmal  afib. s/p unsuccessful ablation/MAZE procedure .  He appears to be in NSR on amiodarone.  Plan: Cont current Eloquis and amiodarone per [facility] recs  S/p emergent exploratory lap w/ right hemicolectomy and appendectomy due to volvulus 9/29 Controlled colonic fistula.  S/p perc drain for persistent ileocolonic anastomotic leak  Plan: Cont tube feeds as tolerated Agree w/ primary service to be mindful of residuals given respiratory status Careful watch of K w/ risk of ileus   Mild anemia of critical illness Plan Per primary service   DM  Plan Per primary service   Alyson Reedy, M.D. Pulmonary and Critical Care Medicine [facility] Pager: [phone]  12/15/2012, 11:07 AM

## 2012-12-16 DIAGNOSIS — R633 Feeding difficulties, unspecified: Secondary | ICD-10-CM

## 2012-12-16 LAB — BASIC METABOLIC PANEL
BUN: 22 mg/dL (ref 6–23)
Calcium: 8.1 mg/dL — ABNORMAL LOW (ref 8.4–10.5)
Creatinine, Ser: 0.7 mg/dL (ref 0.50–1.35)
GFR calc non Af Amer: 89 mL/min — ABNORMAL LOW (ref >90)
Glucose, Bld: 152 mg/dL — ABNORMAL HIGH (ref 70–99)
Sodium: 140 mEq/L (ref 135–145)

## 2012-12-16 LAB — CBC
Hemoglobin: 8.6 g/dL — ABNORMAL LOW (ref 13.0–17.0)
MCH: 29.9 pg (ref 26.0–34.0)
MCHC: 30.2 g/dL (ref 30.0–36.0)
MCV: 99 fL (ref 78.0–100.0)
RBC: 2.88 MIL/uL — ABNORMAL LOW (ref 4.22–5.81)

## 2012-12-16 NOTE — Progress Notes (Signed)
Dr Sharyon Medicus seeking advice re; mgt of this pt's SB fistulas.   Pt's local GI MD is Dr Wandalee Ferdinand at [facility].  Unfortunately he does not have privileges at [facility]. PT had a colonoscopy with polypectomy 02/11/2010 by Dr Evette Cristal.  S/p Maze procedure "hybrid ablation" of A fib 9/22 at [facility].  This was not succesful.  He went back into A fib.  He is on Eliquis.  Pt s/p 9/26 right hemi-colectomy ( ileocolostomy with removal of terminal ileum) for cecal volvulus at [facility]. Post op developed multiple abcesses and enteric fistulas.   Per [facility] surgeons note: pt has "controlled colonic fistula with intrabdominal abscess. He underwent VIR drainage with subsequent readjustments. VIR (11/1) displayed a well drained abdominal fluid collection with no further drainable abscesses. CT abd/pelvis 11/19 with no new fluid collections, decrease in size of perihepatic fluid collection with percutaneous drainage catheter in place, Persistent ileocolonic anastomotic leak with perihepatic fistula". [facility] Surgeons defined tube feeding and residual gastric volume parameters:  "continue tube feeds with peptamen, advance to goal rate of 90cc/hr by 20cc/hr/day if residuals not > 500. Maintain colonic fistula drain to gravity"   Ultimately unable to wean from vent and trach was placed 10/17. Has a staph PNA. Transferred to [facility] for vent wean.   Per CT of 12/15/12 at [facility] there is "right upper quadrant small bowel fistulas. One appears to extend into the colon in the right upper quadrant. The other ends in a intraperitoneal collection which is being drained by the right upper quadrant catheter"  This patient has a surgical problem and questions as to management of the fistulas ought be directed to his surgeons.  They will certainly need to follow up this post operative complication. Per discharge summary, the surgeon is Roland Rack MD in the trauma clinic, [phone].  Recommendation is fro follow up in 2 to 3  weeks.  For now stay with the feeding program they outlined. Unfortunately [facility] does not maintain privileges at [facility].    His pre-albumin is low at 10.6, this can be followed by clinical dietician to assure that malnutrtion improves. Once he is able to take po's these could be started in addition to tube feeds.   Case was d/w Dr Juanda Chance.  In future, when outpt or admitted to the [facility] health system, he should be seen by Dr Evette Cristal for GI needs.   Jennye Moccasin, PA-C Attending MD note:   I have  reviewed the chart. I agree with the Advanced Practitioner's impression and recommendations.   Willa [facility] Pager # [phone]

## 2012-12-17 LAB — DIGOXIN LEVEL: Digoxin Level: 1.3 ng/mL (ref 0.8–2.0)

## 2012-12-17 NOTE — Progress Notes (Signed)
PULMONARY  / CRITICAL CARE MEDICINE  Name: MICCO BOURBEAU MRN: [id number] DOB: January 21, 1934    ADMISSION DATE:  12/14/2012 CONSULTATION DATE:  12/2  REFERRING MD :  Sharyon Medicus PRIMARY SERVICE:  ssh  CHIEF COMPLAINT:  Vent weaning    BRIEF PATIENT DESCRIPTION:  This is a 78 year old male who was admitted to [facility] initially back in sept for elective, but unsuccessful MAZE procedure. 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 His respiratory status was complicated by MSSA PNA and recurrent hypercarbic respiratory failure (required re-intubation X 2) and eventually trach placement. He was transferred to [facility] for vent weaning.    SIGNIFICANT EVENTS / STUDIES:   LINES / TUBES: Trach 10/17>>> RUE picc (at [facility])>>>  CULTURES: MSSA sputum (at [facility])   ANTIBIOTICS: naf (started at [facility])>>>stop date planned for 12/13  SUBJECTIVE:  Appears comfortable on PS wean.  VITAL SIGNS:  Reviewed.  PHYSICAL EXAMINATION: General:  Awake, alert, follows commands Neuro:  No focal def  HEENT:  Trach unremarkable Cardiovascular:  rrr Lungs:  Clear, vt 400s on PSV of 12  Abdomen:  Soft, drain intact, ileostomy fistula draining.  Musculoskeletal:  Intact  Skin:  Mild edema  Recent Labs Lab 12/15/12 0500 12/16/12 0500 12/17/12 0500  NA 144 140  --   K 3.1* 3.1* 4.4  CL 101 96  --   CO2 36* 38*  --   BUN 27* 22  --   CREATININE 0.71 0.70  --   GLUCOSE 134* 152*  --    Recent Labs Lab 12/15/12 0500 12/16/12 0500  HGB 8.6* 8.6*  HCT 28.8* 28.5*  WBC 10.5 8.9  PLT 217 193   Ct Abdomen Pelvis W Contrast  12/15/2012   CLINICAL DATA:  History of a cecal volvulus, right hemicolectomy and ileal colonic fistula.  EXAM: CT ABDOMEN AND PELVIS WITH CONTRAST  TECHNIQUE: Multidetector CT imaging of the abdomen and pelvis was performed using the  standard protocol following bolus administration of intravenous contrast.  CONTRAST:  80mL OMNIPAQUE IOHEXOL 300 MG/ML  SOLN  COMPARISON:  Not available.  FINDINGS: The lung bases demonstrate moderate-sized bilateral pleural effusions, right greater than left with significant bilateral lower lobe atelectasis and or infiltrates. There is not NG tube coursing down the esophagus and into the stomach.  The liver is unremarkable. No focal hepatic lesions or intrahepatic biliary dilatation. The gallbladder is distended with hyperdense material. This could be like tears extrusion of contrast, hemorrhage or sludge. No significant wall thickening or pericholecystic inflammation. Ultrasound may be helpful if there are any clinical findings suspicious for cholestasis.  The pancreas is unremarkable. The spleen is normal in size. Surgical clips are noted near the splenic hilum. The adrenal glands and kidneys are unremarkable except for a slightly hyperdense cyst associated with the left kidney and a small cyst associated with the right kidney. Probable scarring change involving the posterior aspect of the left kidney.  The stomach contains and NG tube. The duodenum, small bowel and colon are grossly normal. No findings for obstruction or acute inflammation. There are surgical changes related to a prior right hemicolectomy. There is a surgical drainage catheter in the right upper quadrant adjacent to the liver. There are 2 small bowel fistula with 1 extending to the colon in the right upper quadrant and the other ending to a collection of contrast and air adjacent to the drainage  catheter.  No mesenteric or retroperitoneal mass or adenopathy. Small scattered lymph nodes are noted. The aorta is normal in caliber. The major branch vessels are patent. There is a small amount of nonocclusive clot noted in a branch of the superior mesenteric vein. No portal vein thrombosis. The splenic vein is patent.  The bladder is unremarkable. The  prostate gland and seminal vesicles are normal. There is presacral edema and fluid but no discrete abscess. The rectum and sigmoid colon are unremarkable. No pelvic mass or adenopathy. No inguinal mass or adenopathy.  The bony structures are unremarkable. There is advanced degenerative disc disease at L5-S1 with bilateral pars defects noted at L5.  IMPRESSION: 1. Right upper quadrant small bowel fistulas. One appears to extend into the colon in the right upper quadrant. The other ends in a intraperitoneal collection which is being drained by the right upper quadrant catheter. 2. Moderate-sized bilateral pleural effusions, right greater than left with significant bilateral lower lobe atelectasis or infiltrates. 3. Distended gallbladder with hyperdense fluid. This may represent vicarious excretion of contrast, hemorrhage or sludge. No CT findings to suggest acute cholecystitis. 4. Nonocclusive clot in a branch of the SMV.   Electronically Signed   By: Loralie Champagne M.D.   On: 12/15/2012 14:07    ASSESSMENT / PLAN:  Acute respiratory failure  Failure to wean Tracheostomy status Pulmonary edema.  MSSA PNA H/o OSA - He looks comfortable on PSV of 12/5, goal is 6 hours today.  - Has element of volume overload, could benefit from gentle diureses.  - Not sure how much narcotics and decreased abd compliance playing a role here.   Recommendation - Continue diuresis as BP and BUN/creatine allow. - Continue weaning trials as progressing. - Limit narcotics to least effective dose.  - f/u CXR 12/5 - oxacillin until 12/13  H/o paroxysmal  afib. s/p unsuccessful ablation/MAZE procedure .  He appears to be in NSR on amiodarone.  Plan: - Cont current Eloquis and amiodarone per [employer].  S/p emergent exploratory lap w/ right hemicolectomy and appendectomy due to volvulus 9/29 Controlled colonic fistula.  S/p perc drain for persistent ileocolonic anastomotic leak  Plan: - Cont tube feeds as  tolerated. - Agree w/ primary service to be mindful of residuals given respiratory status. - Careful watch of K w/ risk of ileus.   Mild anemia of critical illness Plan - Per primary service.  DM  Plan - Per primary service.  Alyson Reedy, M.D. [facility] Pulmonary/Critical Care Medicine. Pager: [phone]. After hours pager: [phone].

## 2012-12-18 LAB — CBC
HCT: 28.7 % — ABNORMAL LOW (ref 39.0–52.0)
MCH: 29.4 pg (ref 26.0–34.0)
MCHC: 29.3 g/dL — ABNORMAL LOW (ref 30.0–36.0)
MCV: 100.3 fL — ABNORMAL HIGH (ref 78.0–100.0)
Platelets: 195 10*3/uL (ref 150–400)
RDW: 18.6 % — ABNORMAL HIGH (ref 11.5–15.5)
WBC: 9.4 10*3/uL (ref 4.0–10.5)

## 2012-12-18 LAB — BASIC METABOLIC PANEL
BUN: 19 mg/dL (ref 6–23)
Calcium: 8.1 mg/dL — ABNORMAL LOW (ref 8.4–10.5)
Creatinine, Ser: 0.73 mg/dL (ref 0.50–1.35)
GFR calc Af Amer: >90 mL/min (ref >90)
GFR calc non Af Amer: 88 mL/min — ABNORMAL LOW (ref >90)
Glucose, Bld: 103 mg/dL — ABNORMAL HIGH (ref 70–99)
Potassium: 3.9 mEq/L (ref 3.5–5.1)

## 2012-12-19 ENCOUNTER — Other Ambulatory Visit (HOSPITAL_COMMUNITY): Payer: Self-pay

## 2012-12-20 ENCOUNTER — Other Ambulatory Visit (HOSPITAL_COMMUNITY): Payer: Self-pay

## 2012-12-20 DIAGNOSIS — I4891 Unspecified atrial fibrillation: Secondary | ICD-10-CM

## 2012-12-20 DIAGNOSIS — R079 Chest pain, unspecified: Secondary | ICD-10-CM

## 2012-12-20 DIAGNOSIS — I2510 Atherosclerotic heart disease of native coronary artery without angina pectoris: Secondary | ICD-10-CM

## 2012-12-20 LAB — BASIC METABOLIC PANEL
Calcium: 8.3 mg/dL — ABNORMAL LOW (ref 8.4–10.5)
GFR calc Af Amer: >90 mL/min (ref >90)
GFR calc non Af Amer: 90 mL/min — ABNORMAL LOW (ref >90)
Potassium: 3.4 mEq/L — ABNORMAL LOW (ref 3.5–5.1)
Sodium: 136 mEq/L (ref 135–145)

## 2012-12-20 LAB — CBC
Hemoglobin: 9.1 g/dL — ABNORMAL LOW (ref 13.0–17.0)
MCH: 29.7 pg (ref 26.0–34.0)
MCHC: 30 g/dL (ref 30.0–36.0)
RDW: 18.3 % — ABNORMAL HIGH (ref 11.5–15.5)
WBC: 9 10*3/uL (ref 4.0–10.5)

## 2012-12-20 NOTE — Progress Notes (Signed)
PULMONARY  / CRITICAL CARE MEDICINE  Name: Tim Short MRN: [id number] DOB: [birth year]    ADMISSION DATE:  12/14/2012 CONSULTATION DATE:  12/2  REFERRING MD :  Sharyon Medicus PRIMARY SERVICE:  ssh  CHIEF COMPLAINT:  Vent weaning    BRIEF PATIENT DESCRIPTION:  78 year old male who was admitted to [facility] initially back in sept for elective, but unsuccessful MAZE procedure. 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 His respiratory status was complicated by MSSA PNA and recurrent hypercarbic respiratory failure (required re-intubation X 2) and eventually trach placement. He was transferred to [facility] for vent weaning.    SIGNIFICANT EVENTS / STUDIES:   LINES / TUBES: Trach 10/17>>> RUE picc (at [facility])>>>  CULTURES: MSSA sputum (at [facility])   ANTIBIOTICS: naf (started at [facility])>>>stop date planned for 12/13  SUBJECTIVE: Tolerating PSV wean.  Goal 8-12 hours  VITAL SIGNS: Reviewed at bedside.   PHYSICAL EXAMINATION: General:  Awake, alert, follows commands Neuro:  No focal def  HEENT:  Trach unremarkable Cardiovascular:  rrr Lungs:  Clear, vt 400s on PSV of 12  Abdomen:  Soft, drain intact, ileostomy fistula draining.  Musculoskeletal:  Intact  Skin:  Mild edema  Recent Labs Lab 12/16/12 0500 12/17/12 0500 12/18/12 0545 12/20/12 0528  NA 140  --  137 136  K 3.1* 4.4 3.9 3.4*  CL 96  --  98 97  CO2 38*  --  33* 32  BUN 22  --  19 10  CREATININE 0.70  --  0.73 0.69  GLUCOSE 152*  --  103* 102*    Recent Labs Lab 12/16/12 0500 12/18/12 0545 12/20/12 0528  HGB 8.6* 8.4* 9.1*  HCT 28.5* 28.7* 30.3*  WBC 8.9 9.4 9.0  PLT 193 195 203   Dg Chest Port 1 View  12/20/2012   CLINICAL DATA:  Respiratory failure  EXAM: PORTABLE CHEST - 1 VIEW  COMPARISON:  Portable exam 0527 hr compared to 12/15/2012  FINDINGS: Endotracheal tube,  nasogastric tube, and right arm PICC line unchanged.  Enlargement of cardiac silhouette.  Left atrial appendage clip noted.  Atherosclerotic calcification aorta.  Decreased pulmonary vascular congestion.  Probable small left pleural effusion with left lower lobe opacity question atelectasis versus consolidation.  Remaining lungs clear.  No pneumothorax.  Calcified granuloma left apex.  IMPRESSION: Small left pleural effusion with persistent mild atelectasis versus consolidation in left lower lobe.   Electronically Signed   By: Ulyses Southward M.D.   On: 12/20/2012 07:12   Dg Abd Portable 1v  12/19/2012   CLINICAL DATA:  Nasogastric tube placement.  EXAM: PORTABLE ABDOMEN - 1 VIEW  COMPARISON:  12/19/2012  FINDINGS: Nasogastric tube is in place, tip overlying the level of the stomach. Surgical clips are identified in the region of the gastroesophageal junction. Visualized bowel gas pattern is nonobstructive. A right upper quadrant pigtail type catheter is partially imaged.  IMPRESSION: 1. Placement of nasogastric tube tip overlying the level the distal stomach. 2. Nonobstructive bowel gas pattern.   Electronically Signed   By: Rosalie Gums M.D.   On: 12/19/2012 22:49   Dg Abd Portable 1v  12/19/2012   CLINICAL DATA:  NG tube placement.  EXAM: PORTABLE ABDOMEN - 1 VIEW  COMPARISON:  CT scan 12/15/2012.  FINDINGS: The NG tube is coiled back in the region of the GE junction and the tip is in the distal  esophagus.  IMPRESSION: NG tube coiled back on itself with the tip in the distal esophagus.   Electronically Signed   By: Loralie Champagne M.D.   On: 12/19/2012 20:21    ASSESSMENT / PLAN:  Acute respiratory failure  Failure to wean Tracheostomy status Pulmonary edema.  MSSA PNA Hx OSA  Plan: - He looks comfortable on trach collar of 12/5, goal is 8-12 hours today, will NOT follow traditional protocol - Continue diuresis as BP and BUN/creatine allow. - Continue weaning trials as progressing., goal 24 hrs  tues, will need vent tonight - Limit narcotics to least effective dose.  - oxacillin until 12/13 - trend CXR  H/o paroxysmal  afib. s/p unsuccessful ablation/MAZE procedure .  He appears to be in NSR on amiodarone.   Plan: - Cont current Eloquis and amiodarone per [employer].  S/p emergent exploratory lap w/ right hemicolectomy and appendectomy due to volvulus 9/29 Controlled colonic fistula.  S/p perc drain for persistent ileocolonic anastomotic leak  Plan: - Careful watch of K w/ risk of ileus.   Mild anemia of critical illness Plan - Per primary service.  DM  Plan - Per primary service.   Canary Brim, NP-C [place] [facility] Pgr: [phone] or [phone]   I have fully examined this patient and agree with above findings.    And edited in full  Mcarthur Rossetti. Tyson Alias, MD, FACP Pgr: [phone] [place] [facility]

## 2012-12-21 LAB — CBC
HCT: 30 % — ABNORMAL LOW (ref 39.0–52.0)
MCHC: 30.3 g/dL (ref 30.0–36.0)
Platelets: 219 10*3/uL (ref 150–400)
RDW: 18 % — ABNORMAL HIGH (ref 11.5–15.5)

## 2012-12-21 LAB — DIFFERENTIAL
Basophils Absolute: 0 10*3/uL (ref 0.0–0.1)
Basophils Relative: 1 % (ref 0–1)
Eosinophils Absolute: 0 10*3/uL (ref 0.0–0.7)
Eosinophils Relative: 1 % (ref 0–5)
Monocytes Absolute: 0.7 10*3/uL (ref 0.1–1.0)
Neutro Abs: 5.6 10*3/uL (ref 1.7–7.7)

## 2012-12-21 LAB — TRIGLYCERIDES: Triglycerides: 118 mg/dL (ref <150)

## 2012-12-21 LAB — PREALBUMIN: Prealbumin: 11.1 mg/dL — ABNORMAL LOW (ref 17.0–34.0)

## 2012-12-21 LAB — COMPREHENSIVE METABOLIC PANEL
Alkaline Phosphatase: 65 U/L (ref 39–117)
BUN: 12 mg/dL (ref 6–23)
Calcium: 8.2 mg/dL — ABNORMAL LOW (ref 8.4–10.5)
Creatinine, Ser: 0.7 mg/dL (ref 0.50–1.35)
GFR calc Af Amer: >90 mL/min (ref >90)
Glucose, Bld: 141 mg/dL — ABNORMAL HIGH (ref 70–99)
Potassium: 3.4 mEq/L — ABNORMAL LOW (ref 3.5–5.1)
Total Protein: 6 g/dL (ref 6.0–8.3)

## 2012-12-21 LAB — PHOSPHORUS: Phosphorus: 2.9 mg/dL (ref 2.3–4.6)

## 2012-12-21 LAB — MAGNESIUM: Magnesium: 2.2 mg/dL (ref 1.5–2.5)

## 2012-12-22 LAB — BASIC METABOLIC PANEL
BUN: 13 mg/dL (ref 6–23)
CO2: 30 mEq/L (ref 19–32)
Creatinine, Ser: 0.7 mg/dL (ref 0.50–1.35)
GFR calc Af Amer: >90 mL/min (ref >90)
GFR calc non Af Amer: 89 mL/min — ABNORMAL LOW (ref >90)
Potassium: 3.3 mEq/L — ABNORMAL LOW (ref 3.5–5.1)

## 2012-12-23 ENCOUNTER — Other Ambulatory Visit (HOSPITAL_COMMUNITY): Payer: Self-pay

## 2012-12-23 DIAGNOSIS — E785 Hyperlipidemia, unspecified: Secondary | ICD-10-CM

## 2012-12-23 LAB — MAGNESIUM: Magnesium: 2.2 mg/dL (ref 1.5–2.5)

## 2012-12-23 LAB — BLOOD GAS, ARTERIAL
Acid-Base Excess: 3.3 mmol/L — ABNORMAL HIGH (ref 0.0–2.0)
Bicarbonate: 27.3 mEq/L — ABNORMAL HIGH (ref 20.0–24.0)
O2 Saturation: 98.5 %
Patient temperature: 98.6
TCO2: 28.5 mmol/L (ref 0–100)
pH, Arterial: 7.438 (ref 7.350–7.450)
pO2, Arterial: 91.8 mmHg (ref 80.0–100.0)

## 2012-12-23 LAB — CK TOTAL AND CKMB (NOT AT ARMC)
CK, MB: 1.7 ng/mL (ref 0.3–4.0)
CK, MB: 2.6 ng/mL (ref 0.3–4.0)
Relative Index: INVALID (ref 0.0–2.5)
Total CK: 14 U/L (ref 7–232)
Total CK: 27 U/L (ref 7–232)

## 2012-12-23 LAB — COMPREHENSIVE METABOLIC PANEL
ALT: 11 U/L (ref 0–53)
Albumin: 2.4 g/dL — ABNORMAL LOW (ref 3.5–5.2)
Alkaline Phosphatase: 63 U/L (ref 39–117)
BUN: 11 mg/dL (ref 6–23)
Calcium: 8.6 mg/dL (ref 8.4–10.5)
GFR calc non Af Amer: >90 mL/min (ref >90)
Potassium: 3.2 mEq/L — ABNORMAL LOW (ref 3.5–5.1)
Sodium: 139 mEq/L (ref 135–145)
Total Protein: 6.3 g/dL (ref 6.0–8.3)

## 2012-12-23 LAB — CBC
Hemoglobin: 10.1 g/dL — ABNORMAL LOW (ref 13.0–17.0)
MCH: 30.2 pg (ref 26.0–34.0)
RBC: 3.34 MIL/uL — ABNORMAL LOW (ref 4.22–5.81)
RDW: 18.5 % — ABNORMAL HIGH (ref 11.5–15.5)
WBC: 9.6 10*3/uL (ref 4.0–10.5)

## 2012-12-23 LAB — PHOSPHORUS: Phosphorus: 2.7 mg/dL (ref 2.3–4.6)

## 2012-12-23 MED ORDER — IOHEXOL 300 MG/ML  SOLN
80.0000 mL | Freq: Once | INTRAMUSCULAR | Status: AC | PRN
  Administered 2012-12-23: 80 mL via INTRAVENOUS

## 2012-12-23 NOTE — Progress Notes (Signed)
PULMONARY  / CRITICAL CARE MEDICINE  Name: Tim Short MRN: [id number] DOB: 05-20-1934    ADMISSION DATE:  12/14/2012 CONSULTATION DATE:  12/2  REFERRING MD :  Sharyon Medicus PRIMARY SERVICE:  ssh  CHIEF COMPLAINT:  Vent weaning    BRIEF PATIENT DESCRIPTION:  77 year old male who was admitted to [facility] initially back in sept for elective, but unsuccessful MAZE procedure. 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 His respiratory status was complicated by MSSA PNA and recurrent hypercarbic respiratory failure (required re-intubation X 2) and eventually trach placement. He was transferred to [facility] for vent weaning.    SIGNIFICANT EVENTS / STUDIES:   LINES / TUBES: Trach 10/17>>> RUE picc (at [facility])>>>  CULTURES: MSSA sputum (at [facility])   ANTIBIOTICS: per im  SUBJECTIVE: on TC 24 hrs  VITAL SIGNS: Vital signs reviewed. Abnormal values will appear under impression plan section.    PHYSICAL EXAMINATION: General:  Awake, alert, follows commands Neuro:  No focal def  HEENT:  Trach unremarkable Cardiovascular:  rrr Lungs:  Clear Abdomen:  Soft, drain intact, ileostomy fistula draining. CT abd results pending Musculoskeletal:  Intact  Skin:  Mild edema  Recent Labs Lab 12/21/12 0620 12/22/12 0651 12/23/12 0545  NA 135 138 139  K 3.4* 3.3* 3.2*  CL 97 102 100  CO2 27 30 28   BUN 12 13 11   CREATININE 0.70 0.70 0.59  GLUCOSE 141* 58* 100*    Recent Labs Lab 12/20/12 0528 12/21/12 0620 12/23/12 0545  HGB 9.1* 9.1* 10.1*  HCT 30.3* 30.0* 32.8*  WBC 9.0 8.5 9.6  PLT 203 219 265   No results found.   ASSESSMENT / PLAN:  Acute respiratory failure  Failure to wean Tracheostomy status Pulmonary edema.  MSSA PNA Hx OSA  Plan: - did well off protocol to 24 hrs TC, continue this, drop cuff, PMV addition - Continue  diuresis as BP and BUN/creatine allow. - Limit narcotics to least effective dose.  - oxacillin until 12/13 - trend CXR -CT done abdo to follow  H/o paroxysmal  afib. s/p unsuccessful ablation/MAZE procedure .  He appears to be in NSR on amiodarone.   Plan: - Cont current Eloquis and amiodarone per [employer].  S/p emergent exploratory lap w/ right hemicolectomy and appendectomy due to volvulus 9/29 Controlled colonic fistula.  S/p perc drain for persistent ileocolonic anastomotic leak  Plan:  Recent Labs Lab 12/21/12 0620 12/22/12 0651 12/23/12 0545  K 3.4* 3.3* 3.2*     - Careful watch of K w/ risk of ileus.   Mild anemia of critical illness  Recent Labs  12/21/12 0620 12/23/12 0545  HGB 9.1* 10.1*    Plan - Per primary service.  DM  Plan - Per primary service.  Brett Canales Minor ACNP Adolph Pollack PCCM Pager [phone] till 3 pm If no answer page [phone] 12/23/2012, 10:32 AM  I have fully examined this patient and agree with above findings.      Mcarthur Rossetti. Tyson Alias, MD, FACP Pgr: [phone] [facility]

## 2012-12-24 LAB — BASIC METABOLIC PANEL
BUN: 12 mg/dL (ref 6–23)
CO2: 27 mEq/L (ref 19–32)
Calcium: 8.5 mg/dL (ref 8.4–10.5)
Chloride: 99 mEq/L (ref 96–112)
Creatinine, Ser: 0.63 mg/dL (ref 0.50–1.35)
GFR calc Af Amer: >90 mL/min (ref >90)
Glucose, Bld: 183 mg/dL — ABNORMAL HIGH (ref 70–99)
Potassium: 3.6 mEq/L (ref 3.5–5.1)

## 2012-12-24 LAB — CBC
HCT: 35 % — ABNORMAL LOW (ref 39.0–52.0)
Hemoglobin: 10.5 g/dL — ABNORMAL LOW (ref 13.0–17.0)
MCH: 29.7 pg (ref 26.0–34.0)
MCHC: 30 g/dL (ref 30.0–36.0)
MCV: 98.9 fL (ref 78.0–100.0)
RDW: 18.3 % — ABNORMAL HIGH (ref 11.5–15.5)

## 2012-12-24 LAB — PROTIME-INR
INR: 1.17 (ref 0.00–1.49)
Prothrombin Time: 14.7 seconds (ref 11.6–15.2)

## 2012-12-25 ENCOUNTER — Other Ambulatory Visit (HOSPITAL_COMMUNITY): Payer: Self-pay

## 2012-12-26 LAB — CBC
HCT: 33.8 % — ABNORMAL LOW (ref 39.0–52.0)
Hemoglobin: 10 g/dL — ABNORMAL LOW (ref 13.0–17.0)
MCH: 29.5 pg (ref 26.0–34.0)
MCHC: 29.6 g/dL — ABNORMAL LOW (ref 30.0–36.0)
MCV: 99.7 fL (ref 78.0–100.0)

## 2012-12-26 LAB — BASIC METABOLIC PANEL
BUN: 18 mg/dL (ref 6–23)
CO2: 30 mEq/L (ref 19–32)
Chloride: 97 mEq/L (ref 96–112)
GFR calc Af Amer: >90 mL/min (ref >90)
GFR calc non Af Amer: 88 mL/min — ABNORMAL LOW (ref >90)
Glucose, Bld: 214 mg/dL — ABNORMAL HIGH (ref 70–99)
Potassium: 3.6 mEq/L (ref 3.5–5.1)
Sodium: 136 mEq/L (ref 135–145)

## 2012-12-27 ENCOUNTER — Other Ambulatory Visit: Payer: Self-pay | Admitting: Interventional Cardiology

## 2012-12-27 ENCOUNTER — Other Ambulatory Visit (HOSPITAL_COMMUNITY): Payer: Self-pay

## 2012-12-27 LAB — CBC
Hemoglobin: 10.4 g/dL — ABNORMAL LOW (ref 13.0–17.0)
MCH: 31 pg (ref 26.0–34.0)
MCV: 97.6 fL (ref 78.0–100.0)
Platelets: 250 10*3/uL (ref 150–400)
RDW: 18.5 % — ABNORMAL HIGH (ref 11.5–15.5)
WBC: 12.2 10*3/uL — ABNORMAL HIGH (ref 4.0–10.5)

## 2012-12-27 LAB — COMPREHENSIVE METABOLIC PANEL
ALT: 10 U/L (ref 0–53)
BUN: 16 mg/dL (ref 6–23)
CO2: 26 mEq/L (ref 19–32)
Calcium: 8.8 mg/dL (ref 8.4–10.5)
Chloride: 97 mEq/L (ref 96–112)
Creatinine, Ser: 0.63 mg/dL (ref 0.50–1.35)
GFR calc Af Amer: >90 mL/min (ref >90)
GFR calc non Af Amer: >90 mL/min (ref >90)
Glucose, Bld: 96 mg/dL (ref 70–99)
Sodium: 137 mEq/L (ref 135–145)

## 2012-12-27 LAB — DIFFERENTIAL
Basophils Absolute: 0 10*3/uL (ref 0.0–0.1)
Eosinophils Relative: 1 % (ref 0–5)
Lymphocytes Relative: 22 % (ref 12–46)
Lymphs Abs: 2.7 10*3/uL (ref 0.7–4.0)
Monocytes Absolute: 1 10*3/uL (ref 0.1–1.0)
Monocytes Relative: 8 % (ref 3–12)
Neutro Abs: 8.4 10*3/uL — ABNORMAL HIGH (ref 1.7–7.7)
Neutrophils Relative %: 69 % (ref 43–77)

## 2012-12-27 LAB — MAGNESIUM: Magnesium: 2.1 mg/dL (ref 1.5–2.5)

## 2012-12-27 NOTE — Progress Notes (Signed)
PULMONARY  / CRITICAL CARE MEDICINE  Name: Tim Short MRN: [id number] DOB: 11/28/1934    ADMISSION DATE:  12/14/2012 CONSULTATION DATE:  12/2  REFERRING MD :  Sharyon Medicus PRIMARY SERVICE:  ssh  CHIEF COMPLAINT:  Vent weaning    BRIEF PATIENT DESCRIPTION:  78 year old male who was admitted to [facility] initially back in sept for elective, but unsuccessful MAZE procedure. 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 His respiratory status was complicated by MSSA PNA and recurrent hypercarbic respiratory failure (required re-intubation X 2) and eventually trach placement. He was transferred to [facility] for vent weaning.    SIGNIFICANT EVENTS / STUDIES:  12/11 CT abdomen No residual small bowel fistula is identified, 1.5 cm complex cyst on  left kidney    LINES / TUBES: Trach 10/17>>> RUE picc (at [facility])>>>  CULTURES: MSSA sputum (at [facility])   ANTIBIOTICS: per im  SUBJECTIVE: on TC 24 hrs Denies pain Increased secretions  VITAL SIGNS: Vital signs reviewed. Abnormal values will appear under impression plan section.    PHYSICAL EXAMINATION: General:  Awake, alert, follows commands Neuro:  No focal def  HEENT:  Trach unremarkable Cardiovascular:  rrr Lungs:  Clear Abdomen:  Soft, drain intact, ileostomy fistula draining. CT abd results pending Musculoskeletal:  Intact  Skin:  Mild edema  Recent Labs Lab 12/24/12 0800 12/26/12 0600 12/27/12 0540  NA 135 136 137  K 3.6 3.6 4.4  CL 99 97 97  CO2 27 30 26   BUN 12 18 16   CREATININE 0.63 0.73 0.63  GLUCOSE 183* 214* 96    Recent Labs Lab 12/24/12 0800 12/26/12 0600 12/27/12 0540  HGB 10.5* 10.0* 10.4*  HCT 35.0* 33.8* 32.7*  WBC 9.4 10.7* 12.2*  PLT 220 255 250   Dg Chest Port 1 View  12/27/2012   CLINICAL DATA:  Tracheostomy tube.  EXAM: PORTABLE CHEST - 1 VIEW   COMPARISON:  12/25/2012  FINDINGS: 0657 hrs. The cardio pericardial silhouette is enlarged. Mediastinal anatomy remains prominent likely secondary to AP technique and rotation. Vascular congestion persists with slight improvement in interstitial opacity at the right midlung. Probable small bilateral pleural effusions persist.  Tracheostomy tube is unchanged. The NG tube passes into the stomach although the distal tip position is not included on the film. Right-sided PICC line tip projects over the expected location of the innominate vein confluence. Left atrial appendage excluder device again noted. Telemetry leads overlie the chest.  IMPRESSION: Slight improvement in aeration in the right mid lung. Otherwise no substantial interval change exam with cardiomegaly, vascular congestion and bibasilar atelectasis/ edema. Probable small bilateral pleural effusions.   Electronically Signed   By: Kennith Center M.D.   On: 12/27/2012 08:05   Dg Chest Port 1 View  12/25/2012   CLINICAL DATA:  Chest pain, NG tube placement  EXAM: PORTABLE CHEST - 1 VIEW  COMPARISON:  None.  FINDINGS: Pulmonary vascular congestion. Very mild interstitial edema is possible. Mild patchy bilateral lower lobe opacities, likely atelectasis. Suspected small left pleural effusion. No pneumothorax.  Cardiomegaly.  Tracheostomy in satisfactory position. Enteric tube courses into the stomach. Pigtail drain in the right upper abdomen.  IMPRESSION: Cardiomegaly with pulmonary vascular congestion and possible mild interstitial edema. Suspected small left pleural effusion.  Associated bilateral lower lobe opacities, likely atelectasis.  Enteric tube courses into the stomach.   Electronically Signed   By: Lurlean Horns  Rito Ehrlich M.D.   On: 12/25/2012 17:14   Dg Abd Portable 1v  12/25/2012   CLINICAL DATA:  NG tube placement  EXAM: PORTABLE ABDOMEN - 1 VIEW  COMPARISON:  CT abdomen pelvis dated 12/23/2012  FINDINGS: Enteric tube terminates in the distal  gastric antrum.  Gaseous distention of multiple loops of small bowel and possibly colon, favored to reflect adynamic ileus.  Residual contrast in the left colon/rectum.  Known right upper quadrant pigtail drain is excluded from this single image.  IMPRESSION: Enteric tube terminates in the distal gastric antrum.  Gaseous distention of bowel, favored to reflect adynamic ileus.   Electronically Signed   By: Charline Bills M.D.   On: 12/25/2012 17:16     ASSESSMENT / PLAN:  Acute respiratory failure  Failure to wean Tracheostomy status Pulmonary edema.  MSSA PNA -treated with oxacillin Hx OSA  Plan: - ATC as tolerated,advance with PMV when secretions decreased - Continue diuresis as BP and BUN/creatine allow. - Limit narcotics to least effective dose.  -Would leave cuffed trach in for now, can chane to cuffless around discharge if clear that no surgery required   H/o paroxysmal  afib. s/p unsuccessful ablation/MAZE procedure .  He appears to be in NSR on amiodarone.   Plan: - Cont lovenox and amiodarone per [employer].  S/p emergent exploratory lap w/ right hemicolectomy and appendectomy due to volvulus 9/29 Controlled colonic fistula.  S/p perc drain for persistent ileocolonic anastomotic leak  Plan:  Recent Labs Lab 12/24/12 0800 12/26/12 0600 12/27/12 0540  K 3.6 3.6 4.4     - Careful watch of K w/ risk of ileus.  Discussed on multidisciplinary rounds with select team  Cyril Mourning MD. FCCP. [facility] Pager [phone] If no response call [phone]    12/27/2012, 11:23 AM

## 2012-12-29 LAB — CBC
HCT: 35.9 % — ABNORMAL LOW (ref 39.0–52.0)
Hemoglobin: 11.1 g/dL — ABNORMAL LOW (ref 13.0–17.0)
MCH: 30.2 pg (ref 26.0–34.0)
MCV: 97.8 fL (ref 78.0–100.0)
RBC: 3.67 MIL/uL — ABNORMAL LOW (ref 4.22–5.81)
WBC: 10.5 10*3/uL (ref 4.0–10.5)

## 2012-12-29 LAB — BASIC METABOLIC PANEL
BUN: 14 mg/dL (ref 6–23)
CO2: 34 mEq/L — ABNORMAL HIGH (ref 19–32)
Calcium: 9.1 mg/dL (ref 8.4–10.5)
Chloride: 92 mEq/L — ABNORMAL LOW (ref 96–112)
Creatinine, Ser: 0.63 mg/dL (ref 0.50–1.35)
GFR calc non Af Amer: >90 mL/min (ref >90)
Glucose, Bld: 146 mg/dL — ABNORMAL HIGH (ref 70–99)
Sodium: 134 mEq/L — ABNORMAL LOW (ref 135–145)

## 2012-12-29 NOTE — Progress Notes (Signed)
PULMONARY  / CRITICAL CARE MEDICINE  Name: DECARI DUGGAR MRN: [id number] DOB: 09/13/1934    ADMISSION DATE:  12/14/2012 CONSULTATION DATE:  12/2  REFERRING MD :  Sharyon Medicus PRIMARY SERVICE:  ssh  CHIEF COMPLAINT:  Vent weaning    BRIEF PATIENT DESCRIPTION:  78 year old male who was admitted to [facility] initially back in sept for elective, but unsuccessful MAZE procedure. 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 His respiratory status was complicated by MSSA PNA and recurrent hypercarbic respiratory failure (required re-intubation X 2) and eventually trach placement. He was transferred to [facility] for vent weaning.    SIGNIFICANT EVENTS / STUDIES:  12/11 CT abdomen No residual small bowel fistula is identified, 1.5 cm complex cyst on  left kidney   LINES / TUBES: Trach 10/17>>> RUE picc (at [facility])>>>  CULTURES: MSSA sputum (at [facility])   ANTIBIOTICS: per im  SUBJECTIVE:  Tol ATC at all times.  Denies CP, dyspnea  VITAL SIGNS: Vital signs reviewed. Abnormal values will appear under impression plan section.    PHYSICAL EXAMINATION: General:  Awake, alert, follows commands sitting OOB in chair  Neuro:  No focal def  HEENT:  Trach unremarkable Cardiovascular:  rrr Lungs:  Clear Abdomen:  Soft, drain intact, ileostomy fistula minimal drainage.  Musculoskeletal:  Intact  Skin:  Mild edema  Recent Labs Lab 12/26/12 0600 12/27/12 0540 12/29/12 0600  NA 136 137 134*  K 3.6 4.4 3.6  CL 97 97 92*  CO2 30 26 34*  BUN 18 16 14   CREATININE 0.73 0.63 0.63  GLUCOSE 214* 96 146*    Recent Labs Lab 12/26/12 0600 12/27/12 0540 12/29/12 0600  HGB 10.0* 10.4* 11.1*  HCT 33.8* 32.7* 35.9*  WBC 10.7* 12.2* 10.5  PLT 255 250 246   No results found.   ASSESSMENT / PLAN:  Acute respiratory failure  Failure to wean Tracheostomy  status Pulmonary edema.  MSSA PNA -treated with oxacillin Hx OSA  Plan: - ATC as tolerated - continue work on PMV as secretions allow  - Continue diuresis as BP and BUN/creatine allow - Limit narcotics  - Would leave cuffed trach in for now, can change to cuffless around discharge if clear that no surgery required   H/o paroxysmal  afib. s/p unsuccessful ablation/MAZE procedure .  Per primary   S/p emergent exploratory lap w/ right hemicolectomy and appendectomy due to volvulus 9/29 Controlled colonic fistula.  S/p perc drain for persistent ileocolonic anastomotic leak  Plan:  - Careful watch of K w/ risk of ileus. - pending f/u CT abd  Discussed on multidisciplinary rounds with select team   PCCM signing off, please call back if needed.   Danford Bad, NP 12/29/2012  10:38 AM Pager: [phone] or [phone]  *Care during the described time interval was provided by me and/or other providers on the critical care team. I have reviewed this patient's available data, including medical history, events of note, physical examination and test results as part of my evaluation.  Laverda Stribling V.

## 2012-12-30 ENCOUNTER — Other Ambulatory Visit (HOSPITAL_COMMUNITY): Payer: Self-pay

## 2013-01-03 ENCOUNTER — Encounter: Payer: Self-pay | Admitting: Radiology

## 2013-01-03 ENCOUNTER — Other Ambulatory Visit (HOSPITAL_COMMUNITY): Payer: Self-pay

## 2013-01-03 LAB — BASIC METABOLIC PANEL
Calcium: 8.7 mg/dL (ref 8.4–10.5)
Chloride: 88 mEq/L — ABNORMAL LOW (ref 96–112)
GFR calc non Af Amer: >90 mL/min (ref >90)
Potassium: 3.9 mEq/L (ref 3.5–5.1)

## 2013-01-03 LAB — CBC
HCT: 35.6 % — ABNORMAL LOW (ref 39.0–52.0)
Hemoglobin: 11.2 g/dL — ABNORMAL LOW (ref 13.0–17.0)
MCH: 30.4 pg (ref 26.0–34.0)
MCHC: 31.5 g/dL (ref 30.0–36.0)
MCV: 96.7 fL (ref 78.0–100.0)
RDW: 17.7 % — ABNORMAL HIGH (ref 11.5–15.5)

## 2013-01-03 MED ORDER — IOHEXOL 300 MG/ML  SOLN
100.0000 mL | Freq: Once | INTRAMUSCULAR | Status: AC | PRN
  Administered 2013-01-03: 100 mL via INTRAVENOUS

## 2013-01-06 LAB — CBC WITH DIFFERENTIAL/PLATELET
Basophils Absolute: 0 10*3/uL (ref 0.0–0.1)
Eosinophils Relative: 1 % (ref 0–5)
HCT: 35.1 % — ABNORMAL LOW (ref 39.0–52.0)
Hemoglobin: 11.1 g/dL — ABNORMAL LOW (ref 13.0–17.0)
Lymphocytes Relative: 28 % (ref 12–46)
Lymphs Abs: 1.6 10*3/uL (ref 0.7–4.0)
MCH: 30.4 pg (ref 26.0–34.0)
MCV: 96.2 fL (ref 78.0–100.0)
Monocytes Absolute: 0.5 10*3/uL (ref 0.1–1.0)
Neutro Abs: 3.5 10*3/uL (ref 1.7–7.7)
RBC: 3.65 MIL/uL — ABNORMAL LOW (ref 4.22–5.81)
RDW: 17.4 % — ABNORMAL HIGH (ref 11.5–15.5)
WBC: 5.8 10*3/uL (ref 4.0–10.5)

## 2013-01-06 LAB — MAGNESIUM: Magnesium: 2 mg/dL (ref 1.5–2.5)

## 2013-01-06 LAB — BASIC METABOLIC PANEL
Chloride: 90 mEq/L — ABNORMAL LOW (ref 96–112)
Creatinine, Ser: 0.59 mg/dL (ref 0.50–1.35)
GFR calc Af Amer: >90 mL/min (ref >90)
Glucose, Bld: 196 mg/dL — ABNORMAL HIGH (ref 70–99)
Potassium: 3.1 mEq/L — ABNORMAL LOW (ref 3.5–5.1)
Sodium: 131 mEq/L — ABNORMAL LOW (ref 135–145)

## 2013-01-07 LAB — BASIC METABOLIC PANEL
BUN: 12 mg/dL (ref 6–23)
Chloride: 93 mEq/L — ABNORMAL LOW (ref 96–112)
GFR calc Af Amer: >90 mL/min (ref >90)
Potassium: 4.5 mEq/L (ref 3.5–5.1)

## 2013-01-08 LAB — BASIC METABOLIC PANEL
CO2: 29 mEq/L (ref 19–32)
Chloride: 88 mEq/L — ABNORMAL LOW (ref 96–112)
GFR calc non Af Amer: >90 mL/min (ref >90)
Sodium: 128 mEq/L — ABNORMAL LOW (ref 135–145)

## 2013-01-09 LAB — BASIC METABOLIC PANEL
CO2: 30 mEq/L (ref 19–32)
Creatinine, Ser: 0.69 mg/dL (ref 0.50–1.35)
GFR calc Af Amer: >90 mL/min (ref >90)
Glucose, Bld: 170 mg/dL — ABNORMAL HIGH (ref 70–99)
Potassium: 3.4 mEq/L — ABNORMAL LOW (ref 3.5–5.1)
Sodium: 131 mEq/L — ABNORMAL LOW (ref 135–145)

## 2013-01-11 ENCOUNTER — Other Ambulatory Visit (HOSPITAL_COMMUNITY): Payer: Self-pay

## 2013-01-11 ENCOUNTER — Telehealth (INDEPENDENT_AMBULATORY_CARE_PROVIDER_SITE_OTHER): Payer: Self-pay

## 2013-01-11 ENCOUNTER — Encounter: Payer: Self-pay | Admitting: Radiology

## 2013-01-11 LAB — BASIC METABOLIC PANEL
BUN: 32 mg/dL — ABNORMAL HIGH (ref 6–23)
Calcium: 9.3 mg/dL (ref 8.4–10.5)
Chloride: 82 mEq/L — ABNORMAL LOW (ref 96–112)
Creatinine, Ser: 0.9 mg/dL (ref 0.50–1.35)
Glucose, Bld: 128 mg/dL — ABNORMAL HIGH (ref 70–99)
Potassium: 3.5 mEq/L — ABNORMAL LOW (ref 3.7–5.3)

## 2013-01-11 LAB — CBC
HCT: 42.1 % (ref 39.0–52.0)
Hemoglobin: 13.5 g/dL (ref 13.0–17.0)
MCH: 30.3 pg (ref 26.0–34.0)
MCHC: 32.1 g/dL (ref 30.0–36.0)

## 2013-01-11 MED ORDER — IOHEXOL 300 MG/ML  SOLN
100.0000 mL | Freq: Once | INTRAMUSCULAR | Status: AC | PRN
  Administered 2013-01-11: 100 mL via INTRAVENOUS

## 2013-01-11 NOTE — Telephone Encounter (Signed)
Patient's son Tim Short called in asking to get a call from Dr Janee Morn or a surgeon to explain why they won't do surgery on his dad and they want him to go back to [facility] for surgery.  The pt is managed by Dr Arnette Norris.  I told the pt's son there is no record on the chart of a surgeon seeing his dad and he states they haven't seen him but talked to Dr Arnette Norris.  I advised he may need to ask Dr Arnette Norris to call for a surgical consult so his dad can be seen and he can ask questions.  He states he will be out of town Thursday through Sunday.  I told him I will let Dr Janee Morn know.

## 2013-01-12 LAB — CBC
MCH: 29.8 pg (ref 26.0–34.0)
MCHC: 31.3 g/dL (ref 30.0–36.0)
MCV: 95.3 fL (ref 78.0–100.0)
Platelets: 236 10*3/uL (ref 150–400)
RBC: 3.82 MIL/uL — ABNORMAL LOW (ref 4.22–5.81)
RDW: 16.4 % — ABNORMAL HIGH (ref 11.5–15.5)

## 2013-01-12 LAB — HEPATIC FUNCTION PANEL
ALT: 16 U/L (ref 0–53)
AST: 25 U/L (ref 0–37)
Albumin: 2.9 g/dL — ABNORMAL LOW (ref 3.5–5.2)
Bilirubin, Direct: <0.2 mg/dL (ref 0.0–0.3)
Total Bilirubin: 0.4 mg/dL (ref 0.3–1.2)
Total Protein: 6.7 g/dL (ref 6.0–8.3)

## 2013-01-12 LAB — BASIC METABOLIC PANEL
CO2: 39 mEq/L — ABNORMAL HIGH (ref 19–32)
Calcium: 8.5 mg/dL (ref 8.4–10.5)
Creatinine, Ser: 0.97 mg/dL (ref 0.50–1.35)
GFR calc non Af Amer: 78 mL/min — ABNORMAL LOW (ref >90)
Glucose, Bld: 155 mg/dL — ABNORMAL HIGH (ref 70–99)
Sodium: 141 mEq/L (ref 137–147)

## 2013-01-13 LAB — COMPREHENSIVE METABOLIC PANEL
ALT: 17 U/L (ref 0–53)
AST: 28 U/L (ref 0–37)
Albumin: 3 g/dL — ABNORMAL LOW (ref 3.5–5.2)
Alkaline Phosphatase: 67 U/L (ref 39–117)
BUN: 36 mg/dL — ABNORMAL HIGH (ref 6–23)
CO2: 36 mEq/L — ABNORMAL HIGH (ref 19–32)
Calcium: 9 mg/dL (ref 8.4–10.5)
Chloride: 91 mEq/L — ABNORMAL LOW (ref 96–112)
Creatinine, Ser: 0.88 mg/dL (ref 0.50–1.35)
GFR calc Af Amer: >90 mL/min (ref >90)
GFR calc non Af Amer: 81 mL/min — ABNORMAL LOW (ref >90)
Glucose, Bld: 183 mg/dL — ABNORMAL HIGH (ref 70–99)
Potassium: 3.5 mEq/L — ABNORMAL LOW (ref 3.7–5.3)
Sodium: 138 mEq/L (ref 137–147)
Total Bilirubin: 0.5 mg/dL (ref 0.3–1.2)
Total Protein: 6.8 g/dL (ref 6.0–8.3)

## 2013-01-13 LAB — CK TOTAL AND CKMB (NOT AT ARMC)
CK, MB: 0.8 ng/mL (ref 0.3–4.0)
RELATIVE INDEX: INVALID (ref 0.0–2.5)
Total CK: 11 U/L (ref 7–232)

## 2013-01-14 LAB — CBC
HCT: 41.6 % (ref 39.0–52.0)
Hemoglobin: 13.1 g/dL (ref 13.0–17.0)
MCH: 29.6 pg (ref 26.0–34.0)
MCHC: 31.5 g/dL (ref 30.0–36.0)
MCV: 94.1 fL (ref 78.0–100.0)
Platelets: 276 10*3/uL (ref 150–400)
RBC: 4.42 MIL/uL (ref 4.22–5.81)
RDW: 16 % — ABNORMAL HIGH (ref 11.5–15.5)
WBC: 9 10*3/uL (ref 4.0–10.5)

## 2013-01-14 LAB — BASIC METABOLIC PANEL
BUN: 35 mg/dL — ABNORMAL HIGH (ref 6–23)
CO2: 36 mEq/L — ABNORMAL HIGH (ref 19–32)
CREATININE: 0.9 mg/dL (ref 0.50–1.35)
Calcium: 9.1 mg/dL (ref 8.4–10.5)
Chloride: 93 mEq/L — ABNORMAL LOW (ref 96–112)
GFR calc Af Amer: >90 mL/min (ref >90)
GFR, EST NON AFRICAN AMERICAN: 81 mL/min — AB (ref >90)
GLUCOSE: 199 mg/dL — AB (ref 70–99)
Potassium: 3.4 mEq/L — ABNORMAL LOW (ref 3.7–5.3)
SODIUM: 143 meq/L (ref 137–147)

## 2013-01-14 LAB — DIGOXIN LEVEL: DIGOXIN LVL: 0.8 ng/mL (ref 0.8–2.0)

## 2013-01-14 LAB — PRO B NATRIURETIC PEPTIDE: Pro B Natriuretic peptide (BNP): 150.5 pg/mL (ref 0–450)

## 2013-01-16 LAB — BASIC METABOLIC PANEL
BUN: 32 mg/dL — AB (ref 6–23)
CALCIUM: 9 mg/dL (ref 8.4–10.5)
CO2: 31 mEq/L (ref 19–32)
CREATININE: 0.88 mg/dL (ref 0.50–1.35)
Chloride: 96 mEq/L (ref 96–112)
GFR, EST NON AFRICAN AMERICAN: 81 mL/min — AB (ref >90)
Glucose, Bld: 208 mg/dL — ABNORMAL HIGH (ref 70–99)
POTASSIUM: 3.4 meq/L — AB (ref 3.7–5.3)
Sodium: 141 mEq/L (ref 137–147)

## 2013-01-17 ENCOUNTER — Other Ambulatory Visit (HOSPITAL_COMMUNITY): Payer: Self-pay

## 2013-01-17 LAB — COMPREHENSIVE METABOLIC PANEL
ALBUMIN: 3 g/dL — AB (ref 3.5–5.2)
ALT: 12 U/L (ref 0–53)
AST: 18 U/L (ref 0–37)
Alkaline Phosphatase: 68 U/L (ref 39–117)
BUN: 33 mg/dL — ABNORMAL HIGH (ref 6–23)
CALCIUM: 9.2 mg/dL (ref 8.4–10.5)
CHLORIDE: 98 meq/L (ref 96–112)
CO2: 30 mEq/L (ref 19–32)
CREATININE: 0.91 mg/dL (ref 0.50–1.35)
GFR calc Af Amer: >90 mL/min (ref >90)
GFR calc non Af Amer: 80 mL/min — ABNORMAL LOW (ref >90)
Glucose, Bld: 150 mg/dL — ABNORMAL HIGH (ref 70–99)
Potassium: 3.6 mEq/L — ABNORMAL LOW (ref 3.7–5.3)
Sodium: 144 mEq/L (ref 137–147)
Total Bilirubin: 0.3 mg/dL (ref 0.3–1.2)
Total Protein: 7.4 g/dL (ref 6.0–8.3)

## 2013-01-17 LAB — CBC
HCT: 43.8 % (ref 39.0–52.0)
HEMOGLOBIN: 13.7 g/dL (ref 13.0–17.0)
MCH: 30.2 pg (ref 26.0–34.0)
MCHC: 31.3 g/dL (ref 30.0–36.0)
MCV: 96.5 fL (ref 78.0–100.0)
PLATELETS: 243 10*3/uL (ref 150–400)
RBC: 4.54 MIL/uL (ref 4.22–5.81)
RDW: 15.9 % — ABNORMAL HIGH (ref 11.5–15.5)
WBC: 13.1 10*3/uL — ABNORMAL HIGH (ref 4.0–10.5)

## 2013-01-17 LAB — DIFFERENTIAL
BASOS PCT: 1 % (ref 0–1)
Basophils Absolute: 0.1 10*3/uL (ref 0.0–0.1)
EOS ABS: 0.1 10*3/uL (ref 0.0–0.7)
EOS PCT: 1 % (ref 0–5)
Lymphocytes Relative: 36 % (ref 12–46)
Lymphs Abs: 4.7 10*3/uL — ABNORMAL HIGH (ref 0.7–4.0)
MONO ABS: 1.5 10*3/uL — AB (ref 0.1–1.0)
MONOS PCT: 11 % (ref 3–12)
Neutro Abs: 6.7 10*3/uL (ref 1.7–7.7)
Neutrophils Relative %: 51 % (ref 43–77)

## 2013-01-17 LAB — PREALBUMIN: PREALBUMIN: 21 mg/dL (ref 17.0–34.0)

## 2013-01-17 LAB — TRIGLYCERIDES: Triglycerides: 310 mg/dL — ABNORMAL HIGH (ref <150)

## 2013-01-17 LAB — PHOSPHORUS: PHOSPHORUS: 3.7 mg/dL (ref 2.3–4.6)

## 2013-01-17 LAB — MAGNESIUM: MAGNESIUM: 2.4 mg/dL (ref 1.5–2.5)

## 2013-01-18 ENCOUNTER — Other Ambulatory Visit (HOSPITAL_COMMUNITY): Payer: Self-pay

## 2013-01-18 LAB — BASIC METABOLIC PANEL
BUN: 33 mg/dL — ABNORMAL HIGH (ref 6–23)
CALCIUM: 9.4 mg/dL (ref 8.4–10.5)
CO2: 33 mEq/L — ABNORMAL HIGH (ref 19–32)
CREATININE: 0.87 mg/dL (ref 0.50–1.35)
Chloride: 98 mEq/L (ref 96–112)
GFR calc non Af Amer: 82 mL/min — ABNORMAL LOW (ref >90)
Glucose, Bld: 223 mg/dL — ABNORMAL HIGH (ref 70–99)
Potassium: 3.9 mEq/L (ref 3.7–5.3)
Sodium: 144 mEq/L (ref 137–147)

## 2013-01-18 LAB — CBC
HCT: 43 % (ref 39.0–52.0)
Hemoglobin: 13.6 g/dL (ref 13.0–17.0)
MCH: 30.6 pg (ref 26.0–34.0)
MCHC: 31.6 g/dL (ref 30.0–36.0)
MCV: 96.6 fL (ref 78.0–100.0)
Platelets: 203 10*3/uL (ref 150–400)
RBC: 4.45 MIL/uL (ref 4.22–5.81)
RDW: 15.8 % — AB (ref 11.5–15.5)
WBC: 18.3 10*3/uL — ABNORMAL HIGH (ref 4.0–10.5)

## 2013-01-18 LAB — PROCALCITONIN: Procalcitonin: 0.22 ng/mL

## 2013-01-18 MED ORDER — IOHEXOL 300 MG/ML  SOLN
100.0000 mL | Freq: Once | INTRAMUSCULAR | Status: AC | PRN
  Administered 2013-01-18: 100 mL via INTRAVENOUS

## 2013-01-19 LAB — CLOSTRIDIUM DIFFICILE BY PCR: Toxigenic C. Difficile by PCR: NEGATIVE

## 2013-01-19 LAB — EXPECTORATED SPUTUM ASSESSMENT W REFEX TO RESP CULTURE

## 2013-01-19 LAB — EXPECTORATED SPUTUM ASSESSMENT W GRAM STAIN, RFLX TO RESP C

## 2013-01-20 LAB — CBC
HCT: 36.2 % — ABNORMAL LOW (ref 39.0–52.0)
HEMOGLOBIN: 11.6 g/dL — AB (ref 13.0–17.0)
MCH: 30.4 pg (ref 26.0–34.0)
MCHC: 32 g/dL (ref 30.0–36.0)
MCV: 95 fL (ref 78.0–100.0)
PLATELETS: 137 10*3/uL — AB (ref 150–400)
RBC: 3.81 MIL/uL — ABNORMAL LOW (ref 4.22–5.81)
RDW: 16 % — ABNORMAL HIGH (ref 11.5–15.5)
WBC: 12.1 10*3/uL — ABNORMAL HIGH (ref 4.0–10.5)

## 2013-01-20 LAB — BASIC METABOLIC PANEL
BUN: 31 mg/dL — ABNORMAL HIGH (ref 6–23)
CALCIUM: 8.2 mg/dL — AB (ref 8.4–10.5)
CO2: 30 mEq/L (ref 19–32)
CREATININE: 0.84 mg/dL (ref 0.50–1.35)
Chloride: 102 mEq/L (ref 96–112)
GFR calc Af Amer: >90 mL/min (ref >90)
GFR, EST NON AFRICAN AMERICAN: 83 mL/min — AB (ref >90)
GLUCOSE: 247 mg/dL — AB (ref 70–99)
Potassium: 3.5 mEq/L — ABNORMAL LOW (ref 3.7–5.3)
SODIUM: 144 meq/L (ref 137–147)

## 2013-01-21 LAB — VANCOMYCIN, TROUGH: Vancomycin Tr: 11.3 ug/mL (ref 10.0–20.0)

## 2013-01-22 LAB — BASIC METABOLIC PANEL
BUN: 30 mg/dL — ABNORMAL HIGH (ref 6–23)
CO2: 36 mEq/L — ABNORMAL HIGH (ref 19–32)
Calcium: 8.6 mg/dL (ref 8.4–10.5)
Chloride: 104 mEq/L (ref 96–112)
Creatinine, Ser: 0.78 mg/dL (ref 0.50–1.35)
GFR calc Af Amer: >90 mL/min (ref >90)
GFR, EST NON AFRICAN AMERICAN: 85 mL/min — AB (ref >90)
Glucose, Bld: 231 mg/dL — ABNORMAL HIGH (ref 70–99)
Potassium: 3 mEq/L — ABNORMAL LOW (ref 3.7–5.3)
Sodium: 152 mEq/L — ABNORMAL HIGH (ref 137–147)

## 2013-01-22 LAB — CULTURE, RESPIRATORY W GRAM STAIN

## 2013-01-22 LAB — CBC
HCT: 40.7 % (ref 39.0–52.0)
HEMOGLOBIN: 12.4 g/dL — AB (ref 13.0–17.0)
MCH: 29.5 pg (ref 26.0–34.0)
MCHC: 30.5 g/dL (ref 30.0–36.0)
MCV: 96.7 fL (ref 78.0–100.0)
Platelets: 145 10*3/uL — ABNORMAL LOW (ref 150–400)
RBC: 4.21 MIL/uL — ABNORMAL LOW (ref 4.22–5.81)
RDW: 15.8 % — ABNORMAL HIGH (ref 11.5–15.5)
WBC: 11 10*3/uL — ABNORMAL HIGH (ref 4.0–10.5)

## 2013-01-22 LAB — CULTURE, RESPIRATORY: Culture: NO GROWTH

## 2013-01-23 LAB — BASIC METABOLIC PANEL
BUN: 29 mg/dL — AB (ref 6–23)
CHLORIDE: 104 meq/L (ref 96–112)
CO2: 38 mEq/L — ABNORMAL HIGH (ref 19–32)
CREATININE: 0.77 mg/dL (ref 0.50–1.35)
Calcium: 8.7 mg/dL (ref 8.4–10.5)
GFR calc non Af Amer: 86 mL/min — ABNORMAL LOW (ref >90)
GLUCOSE: 196 mg/dL — AB (ref 70–99)
Potassium: 3.3 mEq/L — ABNORMAL LOW (ref 3.7–5.3)
Sodium: 151 mEq/L — ABNORMAL HIGH (ref 137–147)

## 2013-01-24 ENCOUNTER — Other Ambulatory Visit: Payer: Self-pay | Admitting: Interventional Cardiology

## 2013-01-24 ENCOUNTER — Other Ambulatory Visit (HOSPITAL_COMMUNITY): Payer: Self-pay

## 2013-01-24 LAB — CBC
HCT: 38.1 % — ABNORMAL LOW (ref 39.0–52.0)
HEMOGLOBIN: 11.7 g/dL — AB (ref 13.0–17.0)
MCH: 29.3 pg (ref 26.0–34.0)
MCHC: 30.7 g/dL (ref 30.0–36.0)
MCV: 95.5 fL (ref 78.0–100.0)
PLATELETS: 152 10*3/uL (ref 150–400)
RBC: 3.99 MIL/uL — ABNORMAL LOW (ref 4.22–5.81)
RDW: 15.7 % — AB (ref 11.5–15.5)
WBC: 12.7 10*3/uL — ABNORMAL HIGH (ref 4.0–10.5)

## 2013-01-24 LAB — COMPREHENSIVE METABOLIC PANEL
ALT: 18 U/L (ref 0–53)
AST: 39 U/L — AB (ref 0–37)
Albumin: 2.4 g/dL — ABNORMAL LOW (ref 3.5–5.2)
Alkaline Phosphatase: 54 U/L (ref 39–117)
BUN: 25 mg/dL — AB (ref 6–23)
CO2: 37 meq/L — AB (ref 19–32)
CREATININE: 0.75 mg/dL (ref 0.50–1.35)
Calcium: 8.5 mg/dL (ref 8.4–10.5)
Chloride: 101 mEq/L (ref 96–112)
GFR calc Af Amer: >90 mL/min (ref >90)
GFR, EST NON AFRICAN AMERICAN: 87 mL/min — AB (ref >90)
Glucose, Bld: 210 mg/dL — ABNORMAL HIGH (ref 70–99)
Potassium: 3.2 mEq/L — ABNORMAL LOW (ref 3.7–5.3)
Sodium: 146 mEq/L (ref 137–147)
Total Bilirubin: 0.3 mg/dL (ref 0.3–1.2)
Total Protein: 6.2 g/dL (ref 6.0–8.3)

## 2013-01-24 LAB — DIFFERENTIAL
BASOS ABS: 0.1 10*3/uL (ref 0.0–0.1)
Basophils Relative: 1 % (ref 0–1)
EOS ABS: 0.3 10*3/uL (ref 0.0–0.7)
Eosinophils Relative: 3 % (ref 0–5)
Lymphocytes Relative: 29 % (ref 12–46)
Lymphs Abs: 3.7 10*3/uL (ref 0.7–4.0)
MONOS PCT: 6 % (ref 3–12)
Monocytes Absolute: 0.8 10*3/uL (ref 0.1–1.0)
NEUTROS ABS: 7.8 10*3/uL — AB (ref 1.7–7.7)
NEUTROS PCT: 62 % (ref 43–77)

## 2013-01-24 LAB — TRIGLYCERIDES: Triglycerides: 244 mg/dL — ABNORMAL HIGH (ref <150)

## 2013-01-24 LAB — PREALBUMIN: PREALBUMIN: 21.1 mg/dL (ref 17.0–34.0)

## 2013-01-24 LAB — MAGNESIUM: MAGNESIUM: 2.5 mg/dL (ref 1.5–2.5)

## 2013-01-24 LAB — PHOSPHORUS: PHOSPHORUS: 2.6 mg/dL (ref 2.3–4.6)

## 2013-01-24 LAB — VANCOMYCIN, TROUGH: VANCOMYCIN TR: 13.3 ug/mL (ref 10.0–20.0)

## 2013-01-25 ENCOUNTER — Other Ambulatory Visit (HOSPITAL_COMMUNITY): Payer: Self-pay

## 2013-01-25 LAB — BLOOD GAS, ARTERIAL
Acid-Base Excess: 9.8 mmol/L — ABNORMAL HIGH (ref 0.0–2.0)
BICARBONATE: 34 meq/L — AB (ref 20.0–24.0)
FIO2: 0.28 %
O2 SAT: 95.1 %
PATIENT TEMPERATURE: 98.6
PH ART: 7.47 — AB (ref 7.350–7.450)
TCO2: 35.4 mmol/L (ref 0–100)
pCO2 arterial: 47.2 mmHg — ABNORMAL HIGH (ref 35.0–45.0)
pO2, Arterial: 68.8 mmHg — ABNORMAL LOW (ref 80.0–100.0)

## 2013-01-26 LAB — CULTURE, BLOOD (ROUTINE X 2)
CULTURE: NO GROWTH
Culture: NO GROWTH

## 2013-01-26 LAB — CK TOTAL AND CKMB (NOT AT ARMC)
CK TOTAL: 19 U/L (ref 7–232)
CK, MB: 1.2 ng/mL (ref 0.3–4.0)
CK, MB: 1.2 ng/mL (ref 0.3–4.0)
Relative Index: INVALID (ref 0.0–2.5)
Relative Index: INVALID (ref 0.0–2.5)
Total CK: 25 U/L (ref 7–232)

## 2013-01-27 LAB — BASIC METABOLIC PANEL
BUN: 16 mg/dL (ref 6–23)
CO2: 29 mEq/L (ref 19–32)
Calcium: 8.1 mg/dL — ABNORMAL LOW (ref 8.4–10.5)
Chloride: 95 mEq/L — ABNORMAL LOW (ref 96–112)
Creatinine, Ser: 0.62 mg/dL (ref 0.50–1.35)
GLUCOSE: 222 mg/dL — AB (ref 70–99)
POTASSIUM: 3.3 meq/L — AB (ref 3.7–5.3)
SODIUM: 137 meq/L (ref 137–147)

## 2013-01-27 LAB — CBC WITH DIFFERENTIAL/PLATELET
BASOS ABS: 0 10*3/uL (ref 0.0–0.1)
Basophils Relative: 0 % (ref 0–1)
EOS ABS: 0.2 10*3/uL (ref 0.0–0.7)
Eosinophils Relative: 2 % (ref 0–5)
HCT: 37.2 % — ABNORMAL LOW (ref 39.0–52.0)
Hemoglobin: 12 g/dL — ABNORMAL LOW (ref 13.0–17.0)
Lymphocytes Relative: 30 % (ref 12–46)
Lymphs Abs: 3.2 10*3/uL (ref 0.7–4.0)
MCH: 29.9 pg (ref 26.0–34.0)
MCHC: 32.3 g/dL (ref 30.0–36.0)
MCV: 92.5 fL (ref 78.0–100.0)
Monocytes Absolute: 0.6 10*3/uL (ref 0.1–1.0)
Monocytes Relative: 6 % (ref 3–12)
NEUTROS PCT: 62 % (ref 43–77)
Neutro Abs: 6.6 10*3/uL (ref 1.7–7.7)
PLATELETS: 180 10*3/uL (ref 150–400)
RBC: 4.02 MIL/uL — ABNORMAL LOW (ref 4.22–5.81)
RDW: 15.8 % — ABNORMAL HIGH (ref 11.5–15.5)
WBC: 10.7 10*3/uL — ABNORMAL HIGH (ref 4.0–10.5)

## 2013-01-27 LAB — CULTURE, RESPIRATORY W GRAM STAIN: Culture: NO GROWTH

## 2013-01-27 LAB — CK TOTAL AND CKMB (NOT AT ARMC)
CK TOTAL: 20 U/L (ref 7–232)
CK, MB: 1.2 ng/mL (ref 0.3–4.0)
Relative Index: INVALID (ref 0.0–2.5)

## 2013-01-27 LAB — CULTURE, RESPIRATORY

## 2013-01-27 LAB — DIGOXIN LEVEL: DIGOXIN LVL: 0.4 ng/mL — AB (ref 0.8–2.0)

## 2013-01-27 LAB — MAGNESIUM: MAGNESIUM: 2 mg/dL (ref 1.5–2.5)

## 2013-01-27 LAB — POTASSIUM: Potassium: 3.8 mEq/L (ref 3.7–5.3)

## 2013-01-29 LAB — BASIC METABOLIC PANEL
BUN: 23 mg/dL (ref 6–23)
CHLORIDE: 101 meq/L (ref 96–112)
CO2: 33 mEq/L — ABNORMAL HIGH (ref 19–32)
CREATININE: 0.74 mg/dL (ref 0.50–1.35)
Calcium: 8.6 mg/dL (ref 8.4–10.5)
GFR calc non Af Amer: 87 mL/min — ABNORMAL LOW (ref >90)
Glucose, Bld: 88 mg/dL (ref 70–99)
Potassium: 3.9 mEq/L (ref 3.7–5.3)
Sodium: 144 mEq/L (ref 137–147)

## 2013-01-29 LAB — CBC
HEMATOCRIT: 38.5 % — AB (ref 39.0–52.0)
HEMOGLOBIN: 12.2 g/dL — AB (ref 13.0–17.0)
MCH: 29.8 pg (ref 26.0–34.0)
MCHC: 31.7 g/dL (ref 30.0–36.0)
MCV: 94.1 fL (ref 78.0–100.0)
Platelets: 185 10*3/uL (ref 150–400)
RBC: 4.09 MIL/uL — ABNORMAL LOW (ref 4.22–5.81)
RDW: 16.5 % — ABNORMAL HIGH (ref 11.5–15.5)
WBC: 9.5 10*3/uL (ref 4.0–10.5)

## 2013-01-30 ENCOUNTER — Other Ambulatory Visit (HOSPITAL_COMMUNITY): Payer: Self-pay

## 2013-01-30 ENCOUNTER — Encounter: Payer: Self-pay | Admitting: Radiology

## 2013-01-30 MED ORDER — IOHEXOL 300 MG/ML  SOLN
100.0000 mL | Freq: Once | INTRAMUSCULAR | Status: AC | PRN
  Administered 2013-01-30: 100 mL via INTRAVENOUS

## 2013-01-31 LAB — PHOSPHORUS: Phosphorus: 4.3 mg/dL (ref 2.3–4.6)

## 2013-01-31 LAB — CBC
HEMATOCRIT: 39.4 % (ref 39.0–52.0)
HEMOGLOBIN: 12.5 g/dL — AB (ref 13.0–17.0)
MCH: 29.7 pg (ref 26.0–34.0)
MCHC: 31.7 g/dL (ref 30.0–36.0)
MCV: 93.6 fL (ref 78.0–100.0)
Platelets: 184 10*3/uL (ref 150–400)
RBC: 4.21 MIL/uL — AB (ref 4.22–5.81)
RDW: 16.7 % — ABNORMAL HIGH (ref 11.5–15.5)
WBC: 9.3 10*3/uL (ref 4.0–10.5)

## 2013-01-31 LAB — DIFFERENTIAL
BASOS ABS: 0.1 10*3/uL (ref 0.0–0.1)
Basophils Relative: 1 % (ref 0–1)
Eosinophils Absolute: 0.2 10*3/uL (ref 0.0–0.7)
Eosinophils Relative: 2 % (ref 0–5)
LYMPHS PCT: 27 % (ref 12–46)
Lymphs Abs: 2.5 10*3/uL (ref 0.7–4.0)
Monocytes Absolute: 0.8 10*3/uL (ref 0.1–1.0)
Monocytes Relative: 8 % (ref 3–12)
NEUTROS ABS: 5.8 10*3/uL (ref 1.7–7.7)
NEUTROS PCT: 63 % (ref 43–77)

## 2013-01-31 LAB — TRIGLYCERIDES: TRIGLYCERIDES: 245 mg/dL — AB (ref <150)

## 2013-01-31 LAB — COMPREHENSIVE METABOLIC PANEL
ALT: 15 U/L (ref 0–53)
AST: 28 U/L (ref 0–37)
Albumin: 2.4 g/dL — ABNORMAL LOW (ref 3.5–5.2)
Alkaline Phosphatase: 58 U/L (ref 39–117)
BILIRUBIN TOTAL: 0.3 mg/dL (ref 0.3–1.2)
BUN: 27 mg/dL — AB (ref 6–23)
CALCIUM: 8.7 mg/dL (ref 8.4–10.5)
CHLORIDE: 100 meq/L (ref 96–112)
CO2: 28 meq/L (ref 19–32)
CREATININE: 0.79 mg/dL (ref 0.50–1.35)
GFR, EST NON AFRICAN AMERICAN: 85 mL/min — AB (ref >90)
GLUCOSE: 236 mg/dL — AB (ref 70–99)
Potassium: 3.8 mEq/L (ref 3.7–5.3)
Sodium: 142 mEq/L (ref 137–147)
Total Protein: 6.2 g/dL (ref 6.0–8.3)

## 2013-01-31 LAB — VANCOMYCIN, TROUGH: Vancomycin Tr: <5 ug/mL — ABNORMAL LOW (ref 10.0–20.0)

## 2013-01-31 LAB — MAGNESIUM: MAGNESIUM: 2.4 mg/dL (ref 1.5–2.5)

## 2013-01-31 LAB — PREALBUMIN: PREALBUMIN: 21.3 mg/dL (ref 17.0–34.0)

## 2013-02-01 NOTE — Telephone Encounter (Signed)
Currently admitted, awating dc instructions

## 2013-02-04 ENCOUNTER — Other Ambulatory Visit: Payer: Self-pay | Admitting: Cardiology

## 2013-02-04 MED ORDER — IRBESARTAN 300 MG PO TABS: ORAL_TABLET | ORAL | Status: DC

## 2013-02-04 MED ORDER — DILTIAZEM HCL ER 180 MG PO CP24: ORAL_CAPSULE | ORAL | Status: DC

## 2013-02-04 MED ORDER — APIXABAN 5 MG PO TABS: ORAL_TABLET | ORAL | Status: DC

## 2013-02-04 MED ORDER — FUROSEMIDE 40 MG PO TABS: ORAL_TABLET | ORAL | Status: DC

## 2013-03-28 ENCOUNTER — Encounter (HOSPITAL_COMMUNITY): Payer: Self-pay | Admitting: Emergency Medicine

## 2013-03-28 ENCOUNTER — Inpatient Hospital Stay (HOSPITAL_COMMUNITY)
Admission: EM | Admit: 2013-03-28 | Discharge: 2013-05-04 | DRG: 388 | Disposition: A | Discharge status: Skilled Nursing Facility | Payer: Medicare HMO | Attending: Critical Care Medicine | Admitting: Critical Care Medicine

## 2013-03-28 ENCOUNTER — Emergency Department (HOSPITAL_COMMUNITY): Payer: Medicare HMO

## 2013-03-28 DIAGNOSIS — E119 Type 2 diabetes mellitus without complications: Secondary | ICD-10-CM | POA: Diagnosis present

## 2013-03-28 DIAGNOSIS — K567 Ileus, unspecified: Secondary | ICD-10-CM

## 2013-03-28 DIAGNOSIS — Z794 Long term (current) use of insulin: Secondary | ICD-10-CM

## 2013-03-28 DIAGNOSIS — J9620 Acute and chronic respiratory failure, unspecified whether with hypoxia or hypercapnia: Secondary | ICD-10-CM

## 2013-03-28 DIAGNOSIS — D638 Anemia in other chronic diseases classified elsewhere: Secondary | ICD-10-CM | POA: Diagnosis present

## 2013-03-28 DIAGNOSIS — G9340 Encephalopathy, unspecified: Secondary | ICD-10-CM | POA: Diagnosis present

## 2013-03-28 DIAGNOSIS — I4891 Unspecified atrial fibrillation: Secondary | ICD-10-CM

## 2013-03-28 DIAGNOSIS — Z8249 Family history of ischemic heart disease and other diseases of the circulatory system: Secondary | ICD-10-CM

## 2013-03-28 DIAGNOSIS — Z87891 Personal history of nicotine dependence: Secondary | ICD-10-CM

## 2013-03-28 DIAGNOSIS — D696 Thrombocytopenia, unspecified: Secondary | ICD-10-CM | POA: Diagnosis present

## 2013-03-28 DIAGNOSIS — Z96659 Presence of unspecified artificial knee joint: Secondary | ICD-10-CM

## 2013-03-28 DIAGNOSIS — Z515 Encounter for palliative care: Secondary | ICD-10-CM

## 2013-03-28 DIAGNOSIS — L02219 Cutaneous abscess of trunk, unspecified: Secondary | ICD-10-CM | POA: Diagnosis present

## 2013-03-28 DIAGNOSIS — E876 Hypokalemia: Secondary | ICD-10-CM | POA: Diagnosis present

## 2013-03-28 DIAGNOSIS — R633 Feeding difficulties, unspecified: Secondary | ICD-10-CM

## 2013-03-28 DIAGNOSIS — I209 Angina pectoris, unspecified: Secondary | ICD-10-CM

## 2013-03-28 DIAGNOSIS — L03319 Cellulitis of trunk, unspecified: Secondary | ICD-10-CM

## 2013-03-28 DIAGNOSIS — Z930 Tracheostomy status: Secondary | ICD-10-CM

## 2013-03-28 DIAGNOSIS — H9190 Unspecified hearing loss, unspecified ear: Secondary | ICD-10-CM | POA: Diagnosis present

## 2013-03-28 DIAGNOSIS — E870 Hyperosmolality and hypernatremia: Secondary | ICD-10-CM

## 2013-03-28 DIAGNOSIS — G4733 Obstructive sleep apnea (adult) (pediatric): Secondary | ICD-10-CM

## 2013-03-28 DIAGNOSIS — K219 Gastro-esophageal reflux disease without esophagitis: Secondary | ICD-10-CM | POA: Diagnosis present

## 2013-03-28 DIAGNOSIS — J45909 Unspecified asthma, uncomplicated: Secondary | ICD-10-CM | POA: Diagnosis present

## 2013-03-28 DIAGNOSIS — J189 Pneumonia, unspecified organism: Secondary | ICD-10-CM | POA: Diagnosis present

## 2013-03-28 DIAGNOSIS — A0472 Enterocolitis due to Clostridium difficile, not specified as recurrent: Secondary | ICD-10-CM

## 2013-03-28 DIAGNOSIS — M129 Arthropathy, unspecified: Secondary | ICD-10-CM | POA: Diagnosis present

## 2013-03-28 DIAGNOSIS — R109 Unspecified abdominal pain: Secondary | ICD-10-CM

## 2013-03-28 DIAGNOSIS — I4892 Unspecified atrial flutter: Secondary | ICD-10-CM | POA: Diagnosis not present

## 2013-03-28 DIAGNOSIS — K560 Paralytic ileus: Principal | ICD-10-CM | POA: Diagnosis present

## 2013-03-28 DIAGNOSIS — E46 Unspecified protein-calorie malnutrition: Secondary | ICD-10-CM

## 2013-03-28 DIAGNOSIS — E669 Obesity, unspecified: Secondary | ICD-10-CM | POA: Diagnosis present

## 2013-03-28 DIAGNOSIS — I10 Essential (primary) hypertension: Secondary | ICD-10-CM

## 2013-03-28 DIAGNOSIS — E440 Moderate protein-calorie malnutrition: Secondary | ICD-10-CM | POA: Diagnosis present

## 2013-03-28 DIAGNOSIS — Z9861 Coronary angioplasty status: Secondary | ICD-10-CM

## 2013-03-28 DIAGNOSIS — J9610 Chronic respiratory failure, unspecified whether with hypoxia or hypercapnia: Secondary | ICD-10-CM

## 2013-03-28 DIAGNOSIS — R531 Weakness: Secondary | ICD-10-CM

## 2013-03-28 DIAGNOSIS — R627 Adult failure to thrive: Secondary | ICD-10-CM | POA: Diagnosis present

## 2013-03-28 DIAGNOSIS — I2510 Atherosclerotic heart disease of native coronary artery without angina pectoris: Secondary | ICD-10-CM

## 2013-03-28 DIAGNOSIS — IMO0002 Reserved for concepts with insufficient information to code with codable children: Secondary | ICD-10-CM

## 2013-03-28 DIAGNOSIS — R601 Generalized edema: Secondary | ICD-10-CM

## 2013-03-28 DIAGNOSIS — R000 Tachycardia, unspecified: Secondary | ICD-10-CM | POA: Diagnosis present

## 2013-03-28 DIAGNOSIS — E785 Hyperlipidemia, unspecified: Secondary | ICD-10-CM

## 2013-03-28 DIAGNOSIS — R079 Chest pain, unspecified: Secondary | ICD-10-CM

## 2013-03-28 HISTORY — DX: Atherosclerotic heart disease of native coronary artery without angina pectoris: I25.10

## 2013-03-28 HISTORY — DX: Calculus of kidney: N20.0

## 2013-03-28 HISTORY — DX: Personal history of pneumonia (recurrent): Z87.01

## 2013-03-28 LAB — CBC WITH DIFFERENTIAL/PLATELET
BASOS ABS: 0 10*3/uL (ref 0.0–0.1)
BASOS PCT: 0 % (ref 0–1)
Eosinophils Absolute: 0 10*3/uL (ref 0.0–0.7)
Eosinophils Relative: 0 % (ref 0–5)
HEMATOCRIT: 30.1 % — AB (ref 39.0–52.0)
HEMOGLOBIN: 9.3 g/dL — AB (ref 13.0–17.0)
LYMPHS ABS: 1.4 10*3/uL (ref 0.7–4.0)
Lymphocytes Relative: 9 % — ABNORMAL LOW (ref 12–46)
MCH: 29.4 pg (ref 26.0–34.0)
MCHC: 30.9 g/dL (ref 30.0–36.0)
MCV: 95.3 fL (ref 78.0–100.0)
MONOS PCT: 6 % (ref 3–12)
Monocytes Absolute: 0.9 10*3/uL (ref 0.1–1.0)
NEUTROS ABS: 13.5 10*3/uL — AB (ref 1.7–7.7)
Neutrophils Relative %: 85 % — ABNORMAL HIGH (ref 43–77)
Platelets: 265 10*3/uL (ref 150–400)
RBC: 3.16 MIL/uL — ABNORMAL LOW (ref 4.22–5.81)
RDW: 20.4 % — ABNORMAL HIGH (ref 11.5–15.5)
WBC: 15.8 10*3/uL — AB (ref 4.0–10.5)

## 2013-03-28 LAB — COMPREHENSIVE METABOLIC PANEL
ALBUMIN: 2.3 g/dL — AB (ref 3.5–5.2)
ALK PHOS: 67 U/L (ref 39–117)
ALT: 30 U/L (ref 0–53)
AST: 26 U/L (ref 0–37)
BUN: 22 mg/dL (ref 6–23)
CHLORIDE: 116 meq/L — AB (ref 96–112)
CO2: 25 meq/L (ref 19–32)
CREATININE: 1.09 mg/dL (ref 0.50–1.35)
Calcium: 8.9 mg/dL (ref 8.4–10.5)
GFR, EST AFRICAN AMERICAN: 74 mL/min — AB (ref >90)
GFR, EST NON AFRICAN AMERICAN: 63 mL/min — AB (ref >90)
GLUCOSE: 221 mg/dL — AB (ref 70–99)
POTASSIUM: 3.3 meq/L — AB (ref 3.7–5.3)
Sodium: 153 mEq/L — ABNORMAL HIGH (ref 137–147)
Total Bilirubin: 0.4 mg/dL (ref 0.3–1.2)
Total Protein: 5.6 g/dL — ABNORMAL LOW (ref 6.0–8.3)

## 2013-03-28 LAB — LIPASE, BLOOD: Lipase: 30 U/L (ref 11–59)

## 2013-03-28 LAB — PRO B NATRIURETIC PEPTIDE: PRO B NATRI PEPTIDE: 2988 pg/mL — AB (ref 0–450)

## 2013-03-28 LAB — I-STAT CG4 LACTIC ACID, ED: LACTIC ACID, VENOUS: 1.64 mmol/L (ref 0.5–2.2)

## 2013-03-28 MED ORDER — IOHEXOL 300 MG/ML  SOLN: 25.0000 mL | INTRAMUSCULAR | Status: DC

## 2013-03-28 MED ORDER — IOHEXOL 300 MG/ML  SOLN
100.0000 mL | Freq: Once | INTRAMUSCULAR | Status: AC | PRN
  Administered 2013-03-28: 100 mL via INTRAVENOUS

## 2013-03-28 MED ORDER — SODIUM CHLORIDE 0.9 % IV BOLUS (SEPSIS)
500.0000 mL | Freq: Once | INTRAVENOUS | Status: AC
  Administered 2013-03-28: 500 mL via INTRAVENOUS

## 2013-03-28 MED ORDER — ONDANSETRON HCL 4 MG/2ML IJ SOLN
4.0000 mg | Freq: Once | INTRAMUSCULAR | Status: AC
  Administered 2013-03-28: 4 mg via INTRAVENOUS
  Filled 2013-03-28: qty 2

## 2013-03-28 MED ORDER — DILTIAZEM HCL 25 MG/5ML IV SOLN
10.0000 mg | Freq: Once | INTRAVENOUS | Status: AC
  Administered 2013-03-28: 10 mg via INTRAVENOUS
  Filled 2013-03-28: qty 5

## 2013-03-28 MED ORDER — FENTANYL CITRATE 0.05 MG/ML IJ SOLN
50.0000 ug | Freq: Once | INTRAMUSCULAR | Status: AC
  Administered 2013-03-28: 50 ug via INTRAVENOUS
  Filled 2013-03-28: qty 2

## 2013-03-28 MED ORDER — IOHEXOL 300 MG/ML  SOLN
25.0000 mL | INTRAMUSCULAR | Status: AC
  Administered 2013-03-28: 25 mL via ORAL

## 2013-03-28 MED ORDER — SODIUM CHLORIDE 0.9 % IV BOLUS (SEPSIS)
1000.0000 mL | Freq: Once | INTRAVENOUS | Status: AC
  Administered 2013-03-28: 1000 mL via INTRAVENOUS

## 2013-03-28 MED ORDER — DILTIAZEM HCL 100 MG IV SOLR
5.0000 mg/h | INTRAVENOUS | Status: DC
  Administered 2013-03-28: 5 mg/h via INTRAVENOUS

## 2013-03-28 NOTE — ED Notes (Signed)
NOTIFIED PHYSICIAN IN POD-D OF PATIENTS NORMAL LABS RESULTS OF CG4+ LACTIC ACID, 19:15 PM ,03/28/2013.

## 2013-03-28 NOTE — ED Notes (Signed)
Pt from [facility], c/o abd distention. Pt had recent removal of abd fistula. Per EMS pt became more distended this afternoon, care takers notice some drainage from abd at drain site.

## 2013-03-28 NOTE — ED Provider Notes (Signed)
CSN: [id number]     Arrival date & time 03/28/13  1654 History   First MD Initiated Contact with Patient 03/28/13 1735     Chief Complaint  Patient presents with  . Abdominal Pain     (Consider location/radiation/quality/duration/timing/severity/associated sxs/prior Treatment) Patient is a 79 y.o. male presenting with abdominal pain. The history is provided by the patient and the EMS personnel.  Abdominal Pain Pain location:  Generalized Pain quality: bloating, sharp and shooting   Pain radiates to:  Does not radiate Pain severity:  Moderate Onset quality:  Gradual Duration:  4 days Timing:  Constant Progression:  Worsening Chronicity:  New Context comment:  Pt in a long term facility for resp failure and trach with recent abd fistula repair.  he states eating without vomiting.  denies pain with eating.  admits to passing gas but unknown when last bowel movement Relieved by:  Nothing Worsened by:  Nothing tried Ineffective treatments:  None tried Associated symptoms: dysuria   Associated symptoms: no chest pain, no chills, no cough, no fever, no nausea and no vomiting   Risk factors: being elderly and multiple surgeries   Risk factors: no alcohol abuse   Risk factors comment:  Hx of volvulus and recent abd fistula repair   Past Medical History  Diagnosis Date  . Hypertension   . Hypercholesteremia   . Asthma   . Meniere disease   . Persistent atrial fibrillation   . Obesity   . Biatrial enlargement     LA size 5.3cm  . Obstructive sleep apnea     AHI 108/hr now on CPAP at 12cm H2O  . Chest pain, unspecified     "just recently" (06/05/12)  . H/O hiatal hernia   . Shortness of breath     "associated w/asthma & AF; mostly w/exertion" (Jun 05, 2012)  . GERD (gastroesophageal reflux disease)     "associated w/hiatal hernia" (Jun 05, 2012)  . Peptic ulcer 1950's  . Migraines     "haven't had one for about 15 years" (Jun 05, 2012)  . Arthritis     "joints" (Jun 05, 2012)  .  Chronic lower back pain   . Kidney stones ~ 1960's    "passed on their own" (06/05/2012)  . Pneumonia 2002; 2006    "spent 8 days in isolation; Norovirus" (06-05-12)  . Other and unspecified angina pectoris   . RLS (restless legs syndrome)   . Diabetes mellitus without complication    Past Surgical History  Procedure Laterality Date  . Wrist fracture surgery  1992    "repaired w/left hip bone graft" (Jun 05, 2012)  . Total knee arthroplasty  08/19/1999  . Colonoscopy w/ polypectomy  2006  . Knee arthroscopy with meniscal repair Right 1974    "medial meniscus repaired" (Jun 05, 2012)  . Cardioversion  10/02/2011    Procedure: CARDIOVERSION;  Surgeon: Corky Crafts, MD;  Location[facility] END[facility]COPY;  Service: Cardiovascular;  Laterality: N/A;  h/p in file drawer  . Cardioversion  11/07/2011    Procedure: CARDIOVERSION;  Surgeon: Corky Crafts, MD;  Location: [facility] ENDOSCOPY;  Service: Cardiovascular;  Laterality: N/A;  h/p from 10/22 in file drawer/dl  . Cardiac catheterization  05/26/2012  . Coronary angioplasty with stent placement  05-Jun-2012    "1" (Jun 05, 2012)  . Inguinal hernia repair Bilateral 2000 and 2005    "one done at a time" (Jun 05, 2012)  . Cataract extraction w/ intraocular lens  implant, bilateral  2009  . Hemorrhoid surgery  ~ 2003  . Hiatal hernia repair  1983  .  Nissen fundoplication  1983   Family History  Problem Relation Age of Onset  . Congestive Heart Failure    . COPD Father   . Heart failure Sister   . Heart failure Brother   . CVA Brother   . Heart attack Brother   . Heart disease Brother   . CVA Brother   . Heart disease Brother    History  Substance Use Topics  . Smoking status: Former Smoker -- 25 years    Types: Pipe    Quit date: 10/15/1981  . Smokeless tobacco: Never Used  . Alcohol Use: No    Review of Systems  Constitutional: Negative for fever and chills.  Respiratory: Negative for cough.   Cardiovascular: Negative for chest pain.   Gastrointestinal: Positive for abdominal pain. Negative for nausea and vomiting.  Genitourinary: Positive for dysuria.  All other systems reviewed and are negative.      Allergies  Corticosteroids and Furosemide  Home Medications   Current Outpatient Rx  Name  Route  Sig  Dispense  Refill  . acetaminophen (TYLENOL) 500 MG tablet   Oral   Take 500 mg by mouth every 6 (six) hours as needed for pain or fever.          Marland Kitchen albuterol (PROVENTIL HFA;VENTOLIN HFA) 108 (90 BASE) MCG/ACT inhaler   Inhalation   Inhale 2 puffs into the lungs every 6 (six) hours as needed for wheezing or shortness of breath.          Marland Kitchen amitriptyline (ELAVIL) 50 MG tablet   Oral   Take 50 mg by mouth at bedtime.         Marland Kitchen apixaban (ELIQUIS) 5 MG TABS tablet      TAKE 1 TABLET TWICE A DAY   180 tablet   0   . atorvastatin (LIPITOR) 10 MG tablet   Oral   Take 10 mg by mouth daily.         . clopidogrel (PLAVIX) 75 MG tablet   Oral   Take 1 tablet (75 mg total) by mouth daily with breakfast.   30 tablet   1   . diltiazem (CARDIZEM CD) 180 MG 24 hr capsule   Oral   Take 180 mg by mouth daily.         Marland Kitchen diltiazem (DILT-XR) 180 MG 24 hr capsule      TAKE 1 CAPSULE EVERY MORNING ON AN EMPTY STOMACH ONCE A DAY   90 capsule   0   . furosemide (LASIX) 40 MG tablet      TAKE 1 TABLET DAILY   90 tablet   0   . irbesartan (AVAPRO) 300 MG tablet      TAKE 1 TABLET DAILY   90 tablet   0   . meclizine (ANTIVERT) 25 MG tablet   Oral   Take 25 mg by mouth 3 (three) times daily as needed for dizziness.          . montelukast (SINGULAIR) 10 MG tablet   Oral   Take 10 mg by mouth at bedtime.         . nitroGLYCERIN (NITROSTAT) 0.4 MG SL tablet   Sublingual   Place 0.4 mg under the tongue every 5 (five) minutes as needed for chest pain.         . potassium chloride SA (K-DUR,KLOR-CON) 20 MEQ tablet   Oral   Take 20 mEq by mouth daily.         Marland Kitchen  propranolol (INDERAL) 60  MG tablet   Oral   Take 60 mg by mouth 2 (two) times daily.         Marland Kitchen rOPINIRole (REQUIP) 1 MG tablet   Oral   Take 1 mg by mouth every 12 (twelve) hours.           BP 90/62  Temp(Src) 98.4 F (36.9 C) (Oral)  Resp 20  SpO2 98% Physical Exam  Nursing note and vitals reviewed. Constitutional: He is oriented to person, place, and time. He appears well-developed and well-nourished. No distress.  HENT:  Head: Normocephalic and atraumatic.  Mouth/Throat: Oropharynx is clear and moist.  Eyes: Conjunctivae and EOM are normal. Pupils are equal, round, and reactive to light.  Neck: Normal range of motion. Neck supple.  Trach in place with mild secretions.    Cardiovascular: Normal rate, regular rhythm and intact distal pulses.   No murmur heard. Pulmonary/Chest: Effort normal and breath sounds normal. No respiratory distress. He has no wheezes. He has no rales.  Abdominal: Soft. He exhibits distension. Bowel sounds are decreased. There is tenderness. There is no rebound and no guarding.  Small open wound on the abd with serous drainage.  Musculoskeletal: Normal range of motion. He exhibits no edema and no tenderness.  Neurological: He is alert and oriented to person, place, and time.  Skin: Skin is warm and dry. No rash noted. No erythema.  Psychiatric: He has a normal mood and affect. His behavior is normal.    ED Course  Procedures (including critical care time) Labs Review Labs Reviewed  CBC WITH DIFFERENTIAL - Abnormal; Notable for the following:    WBC 15.8 (*)    RBC 3.16 (*)    Hemoglobin 9.3 (*)    HCT 30.1 (*)    RDW 20.4 (*)    Neutrophils Relative % 85 (*)    Lymphocytes Relative 9 (*)    Neutro Abs 13.5 (*)    All other components within normal limits  COMPREHENSIVE METABOLIC PANEL - Abnormal; Notable for the following:    Sodium 153 (*)    Potassium 3.3 (*)    Chloride 116 (*)    Glucose, Bld 221 (*)    Total Protein 5.6 (*)    Albumin 2.3 (*)    GFR  calc non Af Amer 63 (*)    GFR calc Af Amer 74 (*)    All other components within normal limits  PRO B NATRIURETIC PEPTIDE - Abnormal; Notable for the following:    Pro B Natriuretic peptide (BNP) 2988.0 (*)    All other components within normal limits  URINE CULTURE  LIPASE, BLOOD  URINALYSIS, ROUTINE W REFLEX MICROSCOPIC  I-STAT CG4 LACTIC ACID, ED   Imaging Review Ct Abdomen Pelvis W Contrast  03/28/2013   CLINICAL DATA:  Abdominal distention.  EXAM: CT ABDOMEN AND PELVIS WITH CONTRAST  TECHNIQUE: Multidetector CT imaging of the abdomen and pelvis was performed using the standard protocol following bolus administration of intravenous contrast.  CONTRAST:  OMNIPAQUE IOHEXOL 300 MG/ML  SOLN  COMPARISON:  DG ABD ACUTE W/CHEST dated 03/28/2013; CT ABD-PELV W/O CM dated 03/11/2013; CT ABD/PELVIS W CM dated 01/30/2013  FINDINGS: There is a new lucency is again noted in the liver. This remains unchanged in appearance and may be infectious in etiology. No clearcut fluid collection in or air collection is noted in the liver to suggest a definite abscess. These changes may be related to phlegmon. Close follow up to evaluate  for developing abscess suggested. Hepatic veins patent. Splenic and portal veins patent. No focal significant splenic abnormality. Pancreas normal. Cholecystectomy. No biliary distention.  Adrenals normal. Stable approximate 13 mm low-density lesion in the left kidney, not definite simple cyst. As noted on prior study, MRI of the kidneys suggest for further evaluation. No hydronephrosis. The bladder is nondistended. No free pelvic fluid. Prostate is not enlarged. Calcifications within the prostate. No significant adenopathy. Abdominal aorta is atherosclerotic. No aneurysm. Visceral vessels are patent. Inferior vena caval filter noted in the IVC with tip just below the renal veins.  Previously identified drainage catheter in the right upper quadrant has been removed. Inflammatory changes  are noted in the right mid abdomen, no abscess noted. The patient has had a prior hemicolectomy. Reference is made to CT report of 01/31/2012 which discusses fistula changes within the abdomen. Patient status post right hemicolectomy. The colon is moderately distended and contains fluid levels. These changes have improved from prior exam suggesting improving ileus. These changes may be related to a diarrheal illness. Follow-up abdominal series suggested. No small bowel distention. The stomach is nondistended. No free air noted.  Atelectasis and/or infiltrates in the lung bases. Bilateral pleural effusions. Cardiomegaly. Coronary artery disease. Midline abdominal scar noted. Fluid noted in the region of the scar with associated small amount of air. Developing abscess should be considered. These findings have progressed slightly from prior study. Multiple anterior abdominal wall subcutaneous nodules most likely injection granulomas.  IMPRESSION: 1. Findings consistent with persistent phlegmon in the liver. 2. Interim removal of right upper quadrant drainage catheter. 3. Colonic distention with air-fluid levels. These findings have improved from prior study suggesting improving adynamic ileus or diarrheal illness. Followup abdominal series suggested. 4. Slight progression of soft tissue fluid in the anterior abdomen in the region the patient's scarring. This could represent developing phlegmon/abscess. Small amount of air is noted within this fluid collection. 5. 13 mm low-density lesion left kidney, not definite simple cyst. As noted on prior study nonenhanced and enhanced MRI of the kidneys suggested for further evaluation.   Electronically Signed   By: Maisie Fus  Register   On: 03/28/2013 23:29   Dg Abd Acute W/chest  03/28/2013   CLINICAL DATA:  Evaluate for a bowel obstruction.  EXAM: ACUTE ABDOMEN SERIES (ABDOMEN 2 VIEW & CHEST 1 VIEW)  COMPARISON:  None.  FINDINGS: Tracheostomy tube tip is above the carina.  Heart size is mildly enlarged. The lung volumes are low. There is asymmetric elevation of the left hemidiaphragm. Left pleural effusion is noted.  Patient has an IVC filter. There are persistent abnormally dilated loops of small bowel which measure up to 5.5 cm. Gas is noted within the colon and rectum. There is no evidence of dilated bowel loops or free intraperitoneal air. No radiopaque calculi or other significant radiographic abnormality is seen. Heart size and mediastinal contours are within normal limits. Both lungs are clear.  IMPRESSION: 1. Persistent small bowel dilatation compatible with either partial small bowel obstruction or ileus. 2. No change in aeration to the lungs compared with previous exam   Electronically Signed   By: Signa Kell M.D.   On: 03/28/2013 18:56     EKG Interpretation   Date/Time:  Monday March 28 2013 20:17:00 EDT Ventricular Rate:  133 PR Interval:  108 QRS Duration: 90 QT Interval:  339 QTC Calculation: 504 R Axis:   2 Text Interpretation:  Sinus tachycardia Repolarization abnormality, prob  rate related Prolonged QT interval Baseline wander  in lead(s) V2 V4 No  significant change since last tracing Confirmed by [facility]KETT  MD, Alphonzo LemmingsWHITNEY  [phone]([id number]) on 03/28/2013 8:30:50 PM      MDM   Final diagnoses:  None    Patient with multiple medical problems including cardiac disease, CHF, volvulus with multiple abdominal surgeries who recently had a fistula repair and is now having abdominal pain for the last 4 days.  Patient denies any vomiting and does admit to having a bowel movement but cannot tell me when. He is in a long-term medical facility due to trach and respiratory failure.  Patient has decreased bowel sounds and diffuse tenderness upon palpation with distention. He is tachycardic in the 130s with systolic blood pressure of 90/58. He is awake and alert and able to answer questions appropriately and denies any respiratory symptoms at this time.  Concern  for bowel obstruction versus other abdominal etiology.  he has no signs of CHF today and we'll give small boluses of IV fluids to see if that improves tachycardia and mild hypotension. CBC, CMP, lipase, UA, BMP, lactate, acute abdominal series pending.  Patient a leukocytosis of 15,000, hypernatremia of 153 within normal cr.  AAS with concern for partial vs full obstruction.  Last surgery 3 weeks ago and last week TPN and NGT were d/ced.    CT to eval further pending.  EKG with sinus tachy.  Currently getting IVF and BP remains around 100.  After IVF no change in HR and repeat evaluation at bedside and of rhythm strip concern for a.fib rvr.  Will given dose of cardizem and place on gtt. CT showed possible evolving phlegmon without abscess in the abd wall but improving adynamic ileus.  However due to presistent tachy do not feel pt is stable to return to [facility]. After cardizem pt HR now controlled in the 70's.    CRITICAL CARE Performed by: Gwyneth SproutPLUNKETT,[employer] Total critical care time: 45 Critical care time was exclusive of separately billable procedures and treating other patients. Critical care was necessary to treat or prevent imminent or life-threatening deterioration. Critical care was time spent personally by me on the following activities: development of treatment plan with patient and/or surrogate as well as nursing, discussions with consultants, evaluation of patient's response to treatment, examination of patient, obtaining history from patient or surrogate, ordering and performing treatments and interventions, ordering and review of laboratory studies, ordering and review of radiographic studies, pulse oximetry and re-evaluation of patient's condition.   Gwyneth SproutWhitney Daune Divirgilio, MD 03/28/13 2342

## 2013-03-28 NOTE — ED Notes (Signed)
Central line placement verified by Dr. Anitra LauthPlunkett

## 2013-03-28 NOTE — ED Notes (Signed)
Tim PortsDavid Short (pt's son)  Cell #[phone]

## 2013-03-28 NOTE — ED Notes (Signed)
Pt drinking contrast for CT.  Son remains at bedside.

## 2013-03-29 ENCOUNTER — Encounter (HOSPITAL_COMMUNITY): Payer: Self-pay | Admitting: General Practice

## 2013-03-29 ENCOUNTER — Inpatient Hospital Stay (HOSPITAL_COMMUNITY): Payer: Medicare HMO

## 2013-03-29 DIAGNOSIS — I4891 Unspecified atrial fibrillation: Secondary | ICD-10-CM

## 2013-03-29 DIAGNOSIS — I2510 Atherosclerotic heart disease of native coronary artery without angina pectoris: Secondary | ICD-10-CM

## 2013-03-29 DIAGNOSIS — K929 Disease of digestive system, unspecified: Secondary | ICD-10-CM

## 2013-03-29 DIAGNOSIS — K567 Ileus, unspecified: Secondary | ICD-10-CM | POA: Diagnosis present

## 2013-03-29 DIAGNOSIS — E870 Hyperosmolality and hypernatremia: Secondary | ICD-10-CM | POA: Diagnosis present

## 2013-03-29 DIAGNOSIS — R109 Unspecified abdominal pain: Secondary | ICD-10-CM | POA: Diagnosis present

## 2013-03-29 DIAGNOSIS — I10 Essential (primary) hypertension: Secondary | ICD-10-CM

## 2013-03-29 DIAGNOSIS — K560 Paralytic ileus: Principal | ICD-10-CM

## 2013-03-29 DIAGNOSIS — Z930 Tracheostomy status: Secondary | ICD-10-CM

## 2013-03-29 LAB — GLUCOSE, CAPILLARY
GLUCOSE-CAPILLARY: 145 mg/dL — AB (ref 70–99)
GLUCOSE-CAPILLARY: 171 mg/dL — AB (ref 70–99)
Glucose-Capillary: 129 mg/dL — ABNORMAL HIGH (ref 70–99)
Glucose-Capillary: 136 mg/dL — ABNORMAL HIGH (ref 70–99)
Glucose-Capillary: 145 mg/dL — ABNORMAL HIGH (ref 70–99)

## 2013-03-29 LAB — MAGNESIUM: Magnesium: 2.2 mg/dL (ref 1.5–2.5)

## 2013-03-29 LAB — URINE MICROSCOPIC-ADD ON

## 2013-03-29 LAB — URINALYSIS, ROUTINE W REFLEX MICROSCOPIC
BILIRUBIN URINE: NEGATIVE
Glucose, UA: NEGATIVE mg/dL
KETONES UR: NEGATIVE mg/dL
LEUKOCYTES UA: NEGATIVE
NITRITE: NEGATIVE
PROTEIN: NEGATIVE mg/dL
Specific Gravity, Urine: 1.041 — ABNORMAL HIGH (ref 1.005–1.030)
UROBILINOGEN UA: 0.2 mg/dL (ref 0.0–1.0)
pH: 5 (ref 5.0–8.0)

## 2013-03-29 LAB — BASIC METABOLIC PANEL
BUN: 20 mg/dL (ref 6–23)
CO2: 23 mEq/L (ref 19–32)
Calcium: 8.4 mg/dL (ref 8.4–10.5)
Chloride: 121 mEq/L — ABNORMAL HIGH (ref 96–112)
Creatinine, Ser: 1.05 mg/dL (ref 0.50–1.35)
GFR calc Af Amer: 77 mL/min — ABNORMAL LOW (ref >90)
GFR calc non Af Amer: 66 mL/min — ABNORMAL LOW (ref >90)
GLUCOSE: 154 mg/dL — AB (ref 70–99)
POTASSIUM: 3.3 meq/L — AB (ref 3.7–5.3)
Sodium: 157 mEq/L — ABNORMAL HIGH (ref 137–147)

## 2013-03-29 LAB — VANCOMYCIN, RANDOM: Vancomycin Rm: 20.9 ug/mL

## 2013-03-29 LAB — MRSA PCR SCREENING: MRSA by PCR: NEGATIVE

## 2013-03-29 MED ORDER — PANTOPRAZOLE SODIUM 40 MG PO PACK
40.0000 mg | PACK | Freq: Two times a day (BID) | ORAL | Status: DC
  Administered 2013-03-29 (×2): 40 mg
  Filled 2013-03-29 (×5): qty 20

## 2013-03-29 MED ORDER — ATORVASTATIN CALCIUM 40 MG PO TABS
40.0000 mg | ORAL_TABLET | Freq: Every day | ORAL | Status: DC
  Administered 2013-03-29 – 2013-05-04 (×36): 40 mg via ORAL
  Filled 2013-03-29 (×39): qty 1

## 2013-03-29 MED ORDER — POTASSIUM CHLORIDE 10 MEQ/100ML IV SOLN
10.0000 meq | INTRAVENOUS | Status: AC
  Administered 2013-03-29 (×4): 10 meq via INTRAVENOUS
  Filled 2013-03-29 (×4): qty 100

## 2013-03-29 MED ORDER — NITROGLYCERIN 0.4 MG SL SUBL: 0.4000 mg | SUBLINGUAL_TABLET | SUBLINGUAL | Status: DC | PRN

## 2013-03-29 MED ORDER — INSULIN ASPART 100 UNIT/ML ~~LOC~~ SOLN
0.0000 [IU] | Freq: Four times a day (QID) | SUBCUTANEOUS | Status: DC
Start: 2013-03-29 — End: 2013-03-30
  Administered 2013-03-29: 1 [IU] via SUBCUTANEOUS
  Administered 2013-03-30: 2 [IU] via SUBCUTANEOUS
  Administered 2013-03-30: 1 [IU] via SUBCUTANEOUS

## 2013-03-29 MED ORDER — CHLORHEXIDINE GLUCONATE 0.12 % MT SOLN
15.0000 mL | Freq: Two times a day (BID) | OROMUCOSAL | Status: DC
  Administered 2013-03-29 – 2013-04-20 (×44): 15 mL via OROMUCOSAL
  Filled 2013-03-29 (×48): qty 15

## 2013-03-29 MED ORDER — OXYCODONE HCL 5 MG PO TABS
5.0000 mg | ORAL_TABLET | Freq: Four times a day (QID) | ORAL | Status: DC | PRN
  Administered 2013-03-31 – 2013-04-20 (×32): 5 mg via ORAL
  Filled 2013-03-29 (×34): qty 1

## 2013-03-29 MED ORDER — DILTIAZEM HCL ER 240 MG PO CP24
240.0000 mg | ORAL_CAPSULE | Freq: Every day | ORAL | Status: DC
  Administered 2013-03-29 – 2013-04-03 (×6): 240 mg via ORAL
  Filled 2013-03-29 (×7): qty 1

## 2013-03-29 MED ORDER — POTASSIUM CHLORIDE CRYS ER 20 MEQ PO TBCR
40.0000 meq | EXTENDED_RELEASE_TABLET | Freq: Once | ORAL | Status: AC
  Administered 2013-03-29: 40 meq via ORAL
  Filled 2013-03-29: qty 2

## 2013-03-29 MED ORDER — LISINOPRIL 5 MG PO TABS
5.0000 mg | ORAL_TABLET | Freq: Every day | ORAL | Status: DC
  Administered 2013-03-29 – 2013-03-30 (×2): 5 mg via ORAL
  Filled 2013-03-29 (×3): qty 1

## 2013-03-29 MED ORDER — ALBUTEROL SULFATE (2.5 MG/3ML) 0.083% IN NEBU: 3.0000 mL | INHALATION_SOLUTION | Freq: Four times a day (QID) | RESPIRATORY_TRACT | Status: DC | PRN

## 2013-03-29 MED ORDER — ROFLUMILAST 500 MCG PO TABS
500.0000 ug | ORAL_TABLET | Freq: Every day | ORAL | Status: DC
Start: 2013-03-29 — End: 2013-04-20
  Administered 2013-03-29 – 2013-04-19 (×22): 500 ug via ORAL
  Filled 2013-03-29 (×23): qty 1

## 2013-03-29 MED ORDER — ONDANSETRON HCL 4 MG PO TABS: 4.0000 mg | ORAL_TABLET | Freq: Four times a day (QID) | ORAL | Status: DC | PRN

## 2013-03-29 MED ORDER — INSULIN NPH (HUMAN) (ISOPHANE) 100 UNIT/ML ~~LOC~~ SUSP: 0.0000 [IU] | Freq: Four times a day (QID) | SUBCUTANEOUS | Status: DC

## 2013-03-29 MED ORDER — POTASSIUM CHLORIDE CRYS ER 20 MEQ PO TBCR
20.0000 meq | EXTENDED_RELEASE_TABLET | Freq: Every day | ORAL | Status: DC
  Administered 2013-03-29 – 2013-03-30 (×2): 20 meq via ORAL
  Filled 2013-03-29 (×2): qty 1

## 2013-03-29 MED ORDER — PIPERACILLIN SOD-TAZOBACTAM SO 3.375 (3-0.375) G IV SOLR: 3.3750 g | Freq: Four times a day (QID) | INTRAVENOUS | Status: DC

## 2013-03-29 MED ORDER — PREDNISONE 10 MG PO TABS
10.0000 mg | ORAL_TABLET | Freq: Every day | ORAL | Status: DC
  Administered 2013-03-29 – 2013-04-02 (×5): 10 mg via ORAL
  Filled 2013-03-29 (×7): qty 1

## 2013-03-29 MED ORDER — PIPERACILLIN-TAZOBACTAM 3.375 G IVPB
3.3750 g | Freq: Three times a day (TID) | INTRAVENOUS | Status: DC
  Administered 2013-03-29: 3.375 g via INTRAVENOUS
  Filled 2013-03-29 (×3): qty 50

## 2013-03-29 MED ORDER — VITAMINS A & D EX OINT
1.0000 "application " | TOPICAL_OINTMENT | CUTANEOUS | Status: DC | PRN
  Filled 2013-03-29: qty 5

## 2013-03-29 MED ORDER — NAPHAZOLINE HCL 0.1 % OP SOLN
1.0000 [drp] | Freq: Four times a day (QID) | OPHTHALMIC | Status: DC | PRN
  Administered 2013-03-29 – 2013-05-03 (×13): 1 [drp] via OPHTHALMIC
  Filled 2013-03-29 (×3): qty 15

## 2013-03-29 MED ORDER — ALUM & MAG HYDROXIDE-SIMETH 200-200-20 MG/5ML PO SUSP
30.0000 mL | Freq: Four times a day (QID) | ORAL | Status: DC | PRN
  Administered 2013-04-17: 30 mL via ORAL
  Filled 2013-03-29: qty 30

## 2013-03-29 MED ORDER — VANCOMYCIN HCL 10 G IV SOLR
1500.0000 mg | Freq: Every day | INTRAVENOUS | Status: DC
  Filled 2013-03-29: qty 1500

## 2013-03-29 MED ORDER — POTASSIUM CHLORIDE 2 MEQ/ML IV SOLN
INTRAVENOUS | Status: DC
  Administered 2013-03-29 – 2013-04-01 (×5): via INTRAVENOUS
  Filled 2013-03-29 (×8): qty 1000

## 2013-03-29 MED ORDER — ONDANSETRON HCL 4 MG/2ML IJ SOLN
4.0000 mg | Freq: Four times a day (QID) | INTRAMUSCULAR | Status: DC | PRN
  Administered 2013-03-31 – 2013-04-25 (×4): 4 mg via INTRAVENOUS
  Filled 2013-03-29 (×5): qty 2

## 2013-03-29 MED ORDER — DABIGATRAN ETEXILATE MESYLATE 150 MG PO CAPS
150.0000 mg | ORAL_CAPSULE | Freq: Two times a day (BID) | ORAL | Status: DC
  Administered 2013-03-29 – 2013-04-03 (×10): 150 mg via ORAL
  Filled 2013-03-29 (×14): qty 1

## 2013-03-29 MED ORDER — MECLIZINE HCL 25 MG PO TABS
25.0000 mg | ORAL_TABLET | Freq: Three times a day (TID) | ORAL | Status: DC | PRN
  Administered 2013-04-04: 25 mg via ORAL
  Filled 2013-03-29: qty 1

## 2013-03-29 MED ORDER — SODIUM CHLORIDE 0.9 % IV SOLN
INTRAVENOUS | Status: DC
  Administered 2013-03-29: 04:00:00 via INTRAVENOUS

## 2013-03-29 MED ORDER — SODIUM CHLORIDE 0.9 % IJ SOLN
3.0000 mL | Freq: Two times a day (BID) | INTRAMUSCULAR | Status: DC
  Administered 2013-03-29 – 2013-04-14 (×9): 3 mL via INTRAVENOUS

## 2013-03-29 NOTE — Progress Notes (Signed)
Pt seen and examined.  See my note

## 2013-03-29 NOTE — Progress Notes (Addendum)
Pt seen and examined, admitted this am per Dr.David. 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 [facility] Unable to wean from vent and trach was placed 10/17.   Transferred to [facility] in December, then to [facility] late January Was getting TNA and Some tube feeds per NGT per Son. Then while in [facility], increasing abd symptoms, was seen by Dr.RIchard Evans(Surgery), [place] underwent gall bladder removal for cholecystitis and resection of part of colon for Fistula as well. And had IVC filter placed. Sent back to [facility] 3/4, was on TPN and Tube feeds then. 3/9 passed swallow eval and started on soft foods and [facility] to abd pain and distension. CT 3/17 : persistent phlegmon in the liver, Colonic distention with air-fluid levels. Surgery following D/w CCS, continue NPO,IVF,  CCS to make decision regarding need for NG decompression. Will need TNA if ileus doesn't resolve soon I have [facility] Discussed with son about [facility] if [facility] indicated if fails conservative mgt, he is opposed to transfer For Hypernatremia and hypokalemia: change IVF, D5 with KCL and IV K, check KUB today Bmet in am  Zannie CovePreetha Rhythm Wigfall, MD [phone]

## 2013-03-29 NOTE — Progress Notes (Signed)
Subjective: Pt complains of abdominal pain.  Looks comfortable.   Objective: Vital signs in last 24 hours: Temp:  [98.3 F (36.8 C)-99.4 F (37.4 C)] 99.4 F (37.4 C) (03/17 0215) Pulse Rate:  [82-130] 96 (03/17 0756) Resp:  [14-24] 18 (03/17 0756) BP: (84-110)/(48-73) 106/65 mmHg (03/17 0215) SpO2:  [95 %-98 %] 96 % (03/17 0756) FiO2 (%):  [28 %-35 %] 35 % (03/17 0756) Weight:  [203 lb 14.8 oz (92.5 kg)] 203 lb 14.8 oz (92.5 kg) (03/17 0215) Last BM Date: 03/29/13  Intake/Output from previous day: 03/16 0701 - 03/17 0700 In: 1000 [I.V.:1000] Out: -  Intake/Output this shift:    Incision/Wound:midline scar noted.  Well healed. Open area inferiorly noted.  Soft no peritonitis  Lab Results:   Recent Labs  03/28/13 1831  WBC 15.8*  HGB 9.3*  HCT 30.1*  PLT 265   BMET  Recent Labs  03/28/13 1831 03/29/13 0446  NA 153* 157*  K 3.3* 3.3*  CL 116* 121*  CO2 25 23  GLUCOSE 221* 154*  BUN 22 20  CREATININE 1.09 1.05  CALCIUM 8.9 8.4   PT/INR No results found for this basename: LABPROT, INR,  in the last 72 hours ABG No results found for this basename: PHART, PCO2, PO2, HCO3,  in the last 72 hours  Studies/Results: Ct Abdomen Pelvis W Contrast  03/28/2013   CLINICAL DATA:  Abdominal distention.  EXAM: CT ABDOMEN AND PELVIS WITH CONTRAST  TECHNIQUE: Multidetector CT imaging of the abdomen and pelvis was performed using the standard protocol following bolus administration of intravenous contrast.  CONTRAST:  100mL OMNIPAQUE IOHEXOL 300 MG/ML  SOLN  COMPARISON:  DG ABD ACUTE W/CHEST dated 03/28/2013; CT ABD-PELV W/O CM dated 03/11/2013; CT ABD/PELVIS W CM dated 01/30/2013  FINDINGS: There is a new lucency is again noted in the liver. This remains unchanged in appearance and may be infectious in etiology. No clearcut fluid collection in or air collection is noted in the liver to suggest a definite abscess. These changes may be related to phlegmon. Close follow up to  evaluate for developing abscess suggested. Hepatic veins patent. Splenic and portal veins patent. No focal significant splenic abnormality. Pancreas normal. Cholecystectomy. No biliary distention.  Adrenals normal. Stable approximate 13 mm low-density lesion in the left kidney, not definite simple cyst. As noted on prior study, MRI of the kidneys suggest for further evaluation. No hydronephrosis. The bladder is nondistended. No free pelvic fluid. Prostate is not enlarged. Calcifications within the prostate. No significant adenopathy. Abdominal aorta is atherosclerotic. No aneurysm. Visceral vessels are patent. Inferior vena caval filter noted in the IVC with tip just below the renal veins.  Previously identified drainage catheter in the right upper quadrant has been removed. Inflammatory changes are noted in the right mid abdomen, no abscess noted. The patient has had a prior hemicolectomy. Reference is made to CT report of 01/31/2012 which discusses fistula changes within the abdomen. Patient status post right hemicolectomy. The colon is moderately distended and contains fluid levels. These changes have improved from prior exam suggesting improving ileus. These changes may be related to a diarrheal illness. Follow-up abdominal series suggested. No small bowel distention. The stomach is nondistended. No free air noted.  Atelectasis and/or infiltrates in the lung bases. Bilateral pleural effusions. Cardiomegaly. Coronary artery disease. Midline abdominal scar noted. Fluid noted in the region of the scar with associated small amount of air. Developing abscess should be considered. These findings have progressed slightly from prior study. Multiple  anterior abdominal wall subcutaneous nodules most likely injection granulomas.  IMPRESSION: 1. Findings consistent with persistent phlegmon in the liver. 2. Interim removal of right upper quadrant drainage catheter. 3. Colonic distention with air-fluid levels. These findings  have improved from prior study suggesting improving adynamic ileus or diarrheal illness. Followup abdominal series suggested. 4. Slight progression of soft tissue fluid in the anterior abdomen in the region the patient's scarring. This could represent developing phlegmon/abscess. Small amount of air is noted within this fluid collection. 5. 13 mm low-density lesion left kidney, not definite simple cyst. As noted on prior study nonenhanced and enhanced MRI of the kidneys suggested for further evaluation.   Electronically Signed   By: Maisie Fus  Register   On: 03/28/2013 23:29   Dg Abd Acute W/chest  03/28/2013   CLINICAL DATA:  Evaluate for a bowel obstruction.  EXAM: ACUTE ABDOMEN SERIES (ABDOMEN 2 VIEW & CHEST 1 VIEW)  COMPARISON:  None.  FINDINGS: Tracheostomy tube tip is above the carina. Heart size is mildly enlarged. The lung volumes are low. There is asymmetric elevation of the left hemidiaphragm. Left pleural effusion is noted.  Patient has an IVC filter. There are persistent abnormally dilated loops of small bowel which measure up to 5.5 cm. Gas is noted within the colon and rectum. There is no evidence of dilated bowel loops or free intraperitoneal air. No radiopaque calculi or other significant radiographic abnormality is seen. Heart size and mediastinal contours are within normal limits. Both lungs are clear.  IMPRESSION: 1. Persistent small bowel dilatation compatible with either partial small bowel obstruction or ileus. 2. No change in aeration to the lungs compared with previous exam   Electronically Signed   By: Signa Kell M.D.   On: 03/28/2013 18:56    Anti-infectives: Anti-infectives   Start     Dose/Rate Route Frequency Ordered Stop   03/29/13 1000  vancomycin (VANCOCIN) 1,500 mg in sodium chloride 0.9 % 500 mL IVPB     1,500 mg 250 mL/hr over 120 Minutes Intravenous Daily 03/29/13 0245     03/29/13 0600  piperacillin-tazobactam (ZOSYN) IVPB 3.375 g     3.375 g 12.5 mL/hr over 240  Minutes Intravenous 3 times per day 03/29/13 0301     03/29/13 0245  piperacillin-tazobactam (ZOSYN) injection 3.375 g  Status:  Discontinued     3.375 g Intramuscular Every 6 hours 03/29/13 0245 03/29/13 0300      Assessment/Plan: Post op state from [place] 3 weeks after colonic surgery for fistula? Abdominal pain Need op reports from [place]. CT shows post op changes.   Recommend primary service contact previous health care providers for guidance.  No surgical issues from CCS standpoint. Can apply dry dressing to wound and change daily.   LOS: 1 day    Demosthenes Virnig A. 03/29/2013

## 2013-03-29 NOTE — ED Notes (Addendum)
Report given to 2 west unit nurse , transported in stable condition , respirations unlabored . Central line intact .

## 2013-03-29 NOTE — Progress Notes (Signed)
Subjective: Pt feeling similar to presentation last night.  Denies N/V/D, fever, chills, sweats, worsening abdominal pain.  Has not had a BM since admission and does state he has had flatus.    Objective: Vital signs in last 24 hours: Temp:  [98.3 F (36.8 C)-99.4 F (37.4 C)] 99.4 F (37.4 C) (03/17 0215) Pulse Rate:  [82-130] 96 (03/17 0756) Resp:  [14-24] 18 (03/17 0756) BP: (84-110)/(48-73) 106/65 mmHg (03/17 0215) SpO2:  [95 %-98 %] 96 % (03/17 0756) FiO2 (%):  [28 %-35 %] 35 % (03/17 0756) Weight:  [203 lb 14.8 oz (92.5 kg)] 203 lb 14.8 oz (92.5 kg) (03/17 0215)    Intake/Output from previous day: 03/16 0701 - 03/17 0700 In: 1000 [I.V.:1000] Out: -  Intake/Output this shift:    General appearance: alert and cooperative, trach in place, comfortable  Resp: clear to auscultation bilaterally Chest wall: no tenderness GI: Soft, slightly distended, Non tender, NABS, no hSM, no guarding, rigidity, midline incision healing well Heart: Irregular Irregular, + 2 pan-systolic murmur at apex with radiation towards the L axilla   Lab Results:   Recent Labs  03/28/13 1831  WBC 15.8*  HGB 9.3*  HCT 30.1*  PLT 265   BMET  Recent Labs  03/28/13 1831 03/29/13 0446  NA 153* 157*  K 3.3* 3.3*  CL 116* 121*  CO2 25 23  GLUCOSE 221* 154*  BUN 22 20  CREATININE 1.09 1.05  CALCIUM 8.9 8.4   Studies/Results: Ct Abdomen Pelvis W Contrast  IMPRESSION: 1. Findings consistent with persistent phlegmon in the liver. 2. Interim removal of right upper quadrant drainage catheter. 3. Colonic distention with air-fluid levels. These findings have improved from prior study suggesting improving adynamic ileus or diarrheal illness. Followup abdominal series suggested. 4. Slight progression of soft tissue fluid in the anterior abdomen in the region the patient's scarring. This could represent developing phlegmon/abscess. Small amount of air is noted within this fluid collection. 5. 13 mm  low-density lesion left kidney, not definite simple cyst. As noted on prior study nonenhanced and enhanced MRI of the kidneys suggested for further evaluation.   Electronically Signed   By: Maisie Fus  Register   On: 03/28/2013 23:29   Dg Abd Acute W/chest  IMPRESSION: 1. Persistent small bowel dilatation compatible with either partial small bowel obstruction or ileus. 2. No change in aeration to the lungs compared with previous exam   Electronically Signed   By: Signa Kell M.D.   On: 03/28/2013 18:56    Anti-infectives: Anti-infectives   Start     Dose/Rate Route Frequency Ordered Stop   03/29/13 1000  vancomycin (VANCOCIN) 1,500 mg in sodium chloride 0.9 % 500 mL IVPB     1,500 mg 250 mL/hr over 120 Minutes Intravenous Daily 03/29/13 0245     03/29/13 0600  piperacillin-tazobactam (ZOSYN) IVPB 3.375 g     3.375 g 12.5 mL/hr over 240 Minutes Intravenous 3 times per day 03/29/13 0301     03/29/13 0245  piperacillin-tazobactam (ZOSYN) injection 3.375 g  Status:  Discontinued     3.375 g Intramuscular Every 6 hours 03/29/13 0245 03/29/13 0300      Assessment/Plan: 79 y/o w/ previous 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  Pt had recent fistula revision performed at [facility].    Observation in setting of previous abdominal surgeries.  Will continue to follow along with medicine,  appreciate consult.  2 view KUB to evaluate for any obstruction at this point   LOS: 1 day    Gildardo CrankerHess, Monifa Blanchette 03/29/2013

## 2013-03-29 NOTE — H&P (Signed)
PCP:    Duane Lopeoss, Alan, MD   Chief Complaint:  abd pain  HPI: 79 yo male with recent complications at [facility] after an ablation for afib in oct 2014 he developed a volvulus after the ablation was intubated/trachedm required multiple abd surgeries then developed fistula details unknown.  Has been on tpn, with ngt on/off since.  Is at [facility] sent here for complaints of abd pain.  Pt was taken off tpn and feeding tube was removed last week.  He has been eating by mouth and doing well.  But last 4 days with worsening abd pain, no n/v/d.  No fevers (however is was started on vanc and zosyn for unknown reasons).  Pt na level is elevated, and appears dehydrated.  Pt appears comfortable.  Review of Systems:  Positive and negative as per HPI otherwise all other systems are negative  Past Medical History: Past Medical History  Diagnosis Date  . Hypertension   . Hypercholesteremia   . Asthma   . Meniere disease   . Persistent atrial fibrillation   . Obesity   . Biatrial enlargement     LA size 5.3cm  . Obstructive sleep apnea     AHI 108/hr now on CPAP at 12cm H2O  . Chest pain, unspecified     "just recently" (06/04/2012)  . H/O hiatal hernia   . Shortness of breath     "associated w/asthma & AF; mostly w/exertion" (06/04/2012)  . GERD (gastroesophageal reflux disease)     "associated w/hiatal hernia" (06/04/2012)  . Peptic ulcer 1950's  . Migraines     "haven't had one for about 15 years" (06/04/2012)  . Arthritis     "joints" (06/04/2012)  . Chronic lower back pain   . Kidney stones ~ 1960's    "passed on their own" (06/04/2012)  . Pneumonia 2002; 2006    "spent 8 days in isolation; Norovirus" (06/04/2012)  . Other and unspecified angina pectoris   . RLS (restless legs syndrome)   . Diabetes mellitus without complication    Past Surgical History  Procedure Laterality Date  . Wrist fracture surgery  1992    "repaired w/left hip bone graft" (06/04/2012)  . Total knee arthroplasty  08/19/1999   . Colonoscopy w/ polypectomy  2006  . Knee arthroscopy with meniscal repair Right 1974    "medial meniscus repaired" (06/04/2012)  . Cardioversion  10/02/2011    Procedure: CARDIOVERSION;  Surgeon: Corky CraftsJayadeep S. Varanasi, MD;  [facility];  Service: Cardiovascular;  Laterality: N/A;  h/p in file drawer  . Cardioversion  11/07/2011    Procedure: CARDIOVERSION;  Surgeon: Corky CraftsJayadeep S. Varanasi, MD;  [facility];  Service: Cardiovascular;  Laterality: N/A;  h/p from 10/22 in file drawer/dl  . Cardiac catheterization  05/26/2012  . Coronary angioplasty with stent placement  06/04/2012    "1" (06/04/2012)  . Inguinal hernia repair Bilateral 2000 and 2005    "one done at a time" (06/04/2012)  . Cataract extraction w/ intraocular lens  implant, bilateral  2009  . Hemorrhoid surgery  ~ 2003  . Hiatal hernia repair  1983  . Nissen fundoplication  1983    Medications: Prior to Admission medications   Medication Sig Start Date End Date Taking? Authorizing Provider  acetaminophen (TYLENOL) 500 MG tablet Take 500 mg by mouth every 6 (six) hours as needed for pain or fever.    Yes Historical Provider, MD  albuterol (PROVENTIL HFA;VENTOLIN HFA) 108 (90 BASE) MCG/ACT inhaler Inhale 2 puffs into the  lungs every 6 (six) hours as needed for wheezing or shortness of breath.    Yes Historical Provider, MD  atorvastatin (LIPITOR) 40 MG tablet Take 40 mg by mouth daily.   Yes Historical Provider, MD  chlorhexidine (PERIDEX) 0.12 % solution Use as directed 15 mLs in the mouth or throat 2 (two) times daily.   Yes Historical Provider, MD  dabigatran (PRADAXA) 150 MG CAPS capsule Take 150 mg by mouth 2 (two) times daily.   Yes Historical Provider, MD  diltiazem (DILACOR XR) 240 MG 24 hr capsule Take 240 mg by mouth daily.   Yes Historical Provider, MD  insulin NPH Human (HUMULIN N,NOVOLIN N) 100 UNIT/ML injection Inject 0-5 Units into the skin every 6 (six) hours. 70-150=0 units 151-200=1unit,  201-250=2units, 251-300=3units, 351-400=5units, 401< call pre scriber   Yes Historical Provider, MD  ipratropium-albuterol (DUONEB) 0.5-2.5 (3) MG/3ML SOLN Take 3 mLs by nebulization every 6 (six) hours as needed.   Yes Historical Provider, MD  isosorbide mononitrate (IMDUR) 30 MG 24 hr tablet Take 30 mg by mouth daily.   Yes Historical Provider, MD  lisinopril (PRINIVIL,ZESTRIL) 5 MG tablet Take 5 mg by mouth at bedtime.   Yes Historical Provider, MD  meclizine (ANTIVERT) 25 MG tablet Take 25 mg by mouth 3 (three) times daily as needed for dizziness.    Yes Historical Provider, MD  nitroGLYCERIN (NITROSTAT) 0.4 MG SL tablet Place 0.4 mg under the tongue every 5 (five) minutes as needed for chest pain.   Yes Historical Provider, MD  oxycodone (OXY-IR) 5 MG capsule Give 5 mg by tube 2 (two) times daily as needed for pain.   Yes Historical Provider, MD  pantoprazole sodium (PROTONIX) 40 mg/20 mL PACK Place 40 mg into feeding tube 2 (two) times daily.   Yes Historical Provider, MD  piperacillin-tazobactam (ZOSYN) 3-0.375 G injection Inject 3.375 g into the muscle every 6 (six) hours. For 10 days 03/22/13  Yes Historical Provider, MD  potassium chloride SA (K-DUR,KLOR-CON) 20 MEQ tablet Take 20 mEq by mouth daily.   Yes Historical Provider, MD  predniSONE (DELTASONE) 10 MG tablet Take 10 mg by mouth daily with breakfast.   Yes Historical Provider, MD  roflumilast (DALIRESP) 500 MCG TABS tablet Take 500 mcg by mouth daily.   Yes Historical Provider, MD  sitaGLIPtin (JANUVIA) 100 MG tablet Take 100 mg by mouth daily.   Yes Historical Provider, MD  Sodium Chloride (HYPERSAL) 3.5 % NEBU 1 vial by Intratracheal route every 6 (six) hours.   Yes Historical Provider, MD  sodium chloride 0.9 % SOLN 500 mL with vancomycin 10 G SOLR 1,500 mg Inject 1,500 mg into the vein daily.   Yes Historical Provider, MD  Vitamins A & D (VITAMIN A & D) ointment Apply 1 application topically as needed (to sacral area).   Yes  Historical Provider, MD    Allergies:   Allergies  Allergen Reactions  . Corticosteroids     Inhaled Corticosteroids--Hoarseness, dry mouth  . Furosemide Other (See Comments)    Ototoxicity     Social History:  reports that he quit smoking about 31 years ago. His smoking use included Pipe. He has never used smokeless tobacco. He reports that he does not drink alcohol or use illicit drugs.  Family History: Family History  Problem Relation Age of Onset  . Congestive Heart Failure    . COPD Father   . Heart failure Sister   . Heart failure Brother   . CVA Brother   .  Heart attack Brother   . Heart disease Brother   . CVA Brother   . Heart disease Brother     Physical Exam: Filed Vitals:   03/28/13 2332 03/28/13 2357 03/29/13 0025 03/29/13 0044  BP: 102/73 92/54    Pulse: 128 82 85 94  Temp:      TempSrc:      Resp: 14 14 16 14   SpO2: 97% 96% 96% 97%   General appearance: alert, cooperative and no distress trach c/d/i Head: Normocephalic, without obvious abnormality, atraumatic Eyes: negative Nose: Nares normal. Septum midline. Mucosa normal. No drainage or sinus tenderness. Neck: no JVD and supple, symmetrical, trachea midline Lungs: clear to auscultation bilaterally Heart: regular rate and rhythm, S1, S2 normal, no murmur, click, rub or gallop Abdomen: soft, non-tender; bowel sounds normal; no masses,  no organomegaly Extremities: extremities normal, atraumatic, no cyanosis or edema Pulses: 2+ and symmetric Skin: Skin color, texture, turgor normal. No rashes or lesions Neurologic: Grossly normal    Labs on Admission:   Recent Labs  03/28/13 1831  NA 153*  K 3.3*  CL 116*  CO2 25  GLUCOSE 221*  BUN 22  CREATININE 1.09  CALCIUM 8.9    Recent Labs  03/28/13 1831  AST 26  ALT 30  ALKPHOS 67  BILITOT 0.4  PROT 5.6*  ALBUMIN 2.3*    Recent Labs  03/28/13 1831  LIPASE 30    Recent Labs  03/28/13 1831  WBC 15.8*  NEUTROABS 13.5*  HGB  9.3*  HCT 30.1*  MCV 95.3  PLT 265   Radiological Exams on Admission: Ct Abdomen Pelvis W Contrast  03/28/2013   CLINICAL DATA:  Abdominal distention.  EXAM: CT ABDOMEN AND PELVIS WITH CONTRAST  TECHNIQUE: Multidetector CT imaging of the abdomen and pelvis was performed using the standard protocol following bolus administration of intravenous contrast.  CONTRAST:  OMNIPAQUE IOHEXOL 300 MG/ML  SOLN  COMPARISON:  DG ABD ACUTE W/CHEST dated 03/28/2013; CT ABD-PELV W/O CM dated 03/11/2013; CT ABD/PELVIS W CM dated 01/30/2013  FINDINGS: There is a new lucency is again noted in the liver. This remains unchanged in appearance and may be infectious in etiology. No clearcut fluid collection in or air collection is noted in the liver to suggest a definite abscess. These changes may be related to phlegmon. Close follow up to evaluate for developing abscess suggested. Hepatic veins patent. Splenic and portal veins patent. No focal significant splenic abnormality. Pancreas normal. Cholecystectomy. No biliary distention.  Adrenals normal. Stable approximate 13 mm low-density lesion in the left kidney, not definite simple cyst. As noted on prior study, MRI of the kidneys suggest for further evaluation. No hydronephrosis. The bladder is nondistended. No free pelvic fluid. Prostate is not enlarged. Calcifications within the prostate. No significant adenopathy. Abdominal aorta is atherosclerotic. No aneurysm. Visceral vessels are patent. Inferior vena caval filter noted in the IVC with tip just below the renal veins.  Previously identified drainage catheter in the right upper quadrant has been removed. Inflammatory changes are noted in the right mid abdomen, no abscess noted. The patient has had a prior hemicolectomy. Reference is made to CT report of 01/31/2012 which discusses fistula changes within the abdomen. Patient status post right hemicolectomy. The colon is moderately distended and contains fluid levels. These  changes have improved from prior exam suggesting improving ileus. These changes may be related to a diarrheal illness. Follow-up abdominal series suggested. No small bowel distention. The stomach is nondistended. No free air noted.  Atelectasis and/or infiltrates in the lung bases. Bilateral pleural effusions. Cardiomegaly. Coronary artery disease. Midline abdominal scar noted. Fluid noted in the region of the scar with associated small amount of air. Developing abscess should be considered. These findings have progressed slightly from prior study. Multiple anterior abdominal wall subcutaneous nodules most likely injection granulomas.  IMPRESSION: 1. Findings consistent with persistent phlegmon in the liver. 2. Interim removal of right upper quadrant drainage catheter. 3. Colonic distention with air-fluid levels. These findings have improved from prior study suggesting improving adynamic ileus or diarrheal illness. Followup abdominal series suggested. 4. Slight progression of soft tissue fluid in the anterior abdomen in the region the patient's scarring. This could represent developing phlegmon/abscess. Small amount of air is noted within this fluid collection. 5. 13 mm low-density lesion left kidney, not definite simple cyst. As noted on prior study nonenhanced and enhanced MRI of the kidneys suggested for further evaluation.   Electronically Signed   By: Maisie Fus  Register   On: 03/28/2013 23:29   Dg Abd Acute W/chest  03/28/2013   CLINICAL DATA:  Evaluate for a bowel obstruction.  EXAM: ACUTE ABDOMEN SERIES (ABDOMEN 2 VIEW & CHEST 1 VIEW)  COMPARISON:  None.  FINDINGS: Tracheostomy tube tip is above the carina. Heart size is mildly enlarged. The lung volumes are low. There is asymmetric elevation of the left hemidiaphragm. Left pleural effusion is noted.  Patient has an IVC filter. There are persistent abnormally dilated loops of small bowel which measure up to 5.5 cm. Gas is noted within the colon and rectum.  There is no evidence of dilated bowel loops or free intraperitoneal air. No radiopaque calculi or other significant radiographic abnormality is seen. Heart size and mediastinal contours are within normal limits. Both lungs are clear.  IMPRESSION: 1. Persistent small bowel dilatation compatible with either partial small bowel obstruction or ileus. 2. No change in aeration to the lungs compared with previous exam   Electronically Signed   By: Signa Kell M.D.   On: 03/28/2013 18:56    Assessment/Plan  79 yo male with abd pain, hypernatremia, mild dehydration  Principal Problem:   Abdominal pain- abd exam is benign.  gen surgery has been consulted to see in am.    Active Problems:   Atrial fibrillation- had mild rvr, resolved with iv card bolus.  Place on tele.   Essential hypertension   Tracheostomy status   Ileus  Possible, keep npo until seen by surgery   Hypernatremia  Ivf, repeat in am, ck other lytes.  Unclear why sent here from [facility], does not appear he has anything going on that could not be handled at [facility].  obs overnight.    Tim Short A 03/29/2013, 1:07 AM

## 2013-03-29 NOTE — Consult Note (Signed)
Reason for Consult:recent abd surgery Referring Physician: Dr. [name] is an 79 y.o. male.  HPI: The pt is a 79yo wm who is a very poor historian. He apparently was at [facility] in late September for a failed maize procedure. 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 His anastamosis leaked and was controlled with a drain. It is not clear if the leak healed. He had to have a trach. At some point he ended up at [facility] where his family claims he had another surgery to correct the fistula. He now shows up at [facility] about 3 weeks out from some sort of surgery at [facility]. He denies vomiting. He has had flatus. He has had CT's in the last couple months that did show an ileus that looks better on tonight's scan. Mostly tonight's scan shows typical postop changes and ileus.  Past Medical History  Diagnosis Date  . Hypertension   . Hypercholesteremia   . Asthma   . Meniere disease   . Persistent atrial fibrillation   . Obesity   . Biatrial enlargement     LA size 5.3cm  . Obstructive sleep apnea     AHI 108/hr now on CPAP at 12cm H2O  . Chest pain, unspecified     "just recently" (06/05/12)  . H/O hiatal hernia   . Shortness of breath     "associated w/asthma & AF; mostly w/exertion" (06/05/12)  . GERD (gastroesophageal reflux disease)     "associated w/hiatal hernia" (Jun 05, 2012)  . Peptic ulcer 1950's  . Migraines     "haven't had one for about 15 years" (06-05-12)  . Arthritis     "joints" (Jun 05, 2012)  . Chronic lower back pain   . Kidney stones ~ 1960's    "passed on their own" (06-05-12)  . Pneumonia 2002; 2006    "spent 8 days in isolation; Norovirus" (2012-06-05)  . Other and unspecified angina pectoris   . RLS (restless legs syndrome)   . Diabetes mellitus without complication     Past Surgical History  Procedure Laterality Date  . Wrist fracture surgery  1992    "repaired w/left hip bone graft"  (2012-06-05)  . Total knee arthroplasty  08/19/1999  . Colonoscopy w/ polypectomy  2006  . Knee arthroscopy with meniscal repair Right 1974    "medial meniscus repaired" (06-05-12)  . Cardioversion  10/02/2011    Procedure: CARDIOVERSION;  Surgeon: Jettie Booze, MD;  Location: [facility] ENDOSCOPY;  Service: Cardiovascular;  Laterality: N/A;  h/p in file drawer  . Cardioversion  11/07/2011    Procedure: CARDIOVERSION;  Surgeon: Jettie Booze, MD;  Location: [facility] ENDOSCOPY;  Service: Cardiovascular;  Laterality: N/A;  h/p from 10/22 in file drawer/dl  . Cardiac catheterization  05/26/2012  . Coronary angioplasty with stent placement  05-Jun-2012    "1" (06-05-12)  . Inguinal hernia repair Bilateral 2000 and 2005    "one done at a time" (06/05/12)  . Cataract extraction w/ intraocular lens  implant, bilateral  2009  . Hemorrhoid surgery  ~ 2003  . Hiatal hernia repair  1983  . Nissen fundoplication  4235    Family History  Problem Relation Age of Onset  . Congestive Heart Failure    . COPD Father   . Heart failure Sister   . Heart failure Brother   . CVA Brother   . Heart attack Brother   . Heart disease Brother   . CVA  Brother   . Heart disease Brother     Social History:  reports that he quit smoking about 31 years ago. His smoking use included Pipe. He has never used smokeless tobacco. He reports that he does not drink alcohol or use illicit drugs.  Allergies:  Allergies  Allergen Reactions  . Corticosteroids     Inhaled Corticosteroids--Hoarseness, dry mouth  . Furosemide Other (See Comments)    Ototoxicity     Medications: I have reviewed the patient's current medications.  Results for orders placed during the hospital encounter of 03/28/13 (from the past 48 hour(s))  CBC WITH DIFFERENTIAL     Status: Abnormal   Collection Time    03/28/13  6:31 PM      Result Value Ref Range   WBC 15.8 (*) 4.0 - 10.5 K/uL   RBC 3.16 (*) 4.22 - 5.81 MIL/uL   Hemoglobin 9.3  (*) 13.0 - 17.0 g/dL   HCT 30.1 (*) 39.0 - 52.0 %   MCV 95.3  78.0 - 100.0 fL   MCH 29.4  26.0 - 34.0 pg   MCHC 30.9  30.0 - 36.0 g/dL   RDW 20.4 (*) 11.5 - 15.5 %   Platelets 265  150 - 400 K/uL   Neutrophils Relative % 85 (*) 43 - 77 %   Lymphocytes Relative 9 (*) 12 - 46 %   Monocytes Relative 6  3 - 12 %   Eosinophils Relative 0  0 - 5 %   Basophils Relative 0  0 - 1 %   Neutro Abs 13.5 (*) 1.7 - 7.7 K/uL   Lymphs Abs 1.4  0.7 - 4.0 K/uL   Monocytes Absolute 0.9  0.1 - 1.0 K/uL   Eosinophils Absolute 0.0  0.0 - 0.7 K/uL   Basophils Absolute 0.0  0.0 - 0.1 K/uL   WBC Morphology MILD LEFT SHIFT (1-5% METAS, OCC MYELO, OCC BANDS)    COMPREHENSIVE METABOLIC PANEL     Status: Abnormal   Collection Time    03/28/13  6:31 PM      Result Value Ref Range   Sodium 153 (*) 137 - 147 mEq/L   Potassium 3.3 (*) 3.7 - 5.3 mEq/L   Chloride 116 (*) 96 - 112 mEq/L   CO2 25  19 - 32 mEq/L   Glucose, Bld 221 (*) 70 - 99 mg/dL   BUN 22  6 - 23 mg/dL   Creatinine, Ser 1.09  0.50 - 1.35 mg/dL   Calcium 8.9  8.4 - 10.5 mg/dL   Total Protein 5.6 (*) 6.0 - 8.3 g/dL   Albumin 2.3 (*) 3.5 - 5.2 g/dL   AST 26  0 - 37 U/L   ALT 30  0 - 53 U/L   Alkaline Phosphatase 67  39 - 117 U/L   Total Bilirubin 0.4  0.3 - 1.2 mg/dL   GFR calc non Af Amer 63 (*) >90 mL/min   GFR calc Af Amer 74 (*) >90 mL/min   Comment: (NOTE)     The eGFR has been calculated using the CKD EPI equation.     This calculation has not been validated in all clinical situations.     eGFR's persistently <90 mL/min signify possible Chronic Kidney     Disease.  LIPASE, BLOOD     Status: None   Collection Time    03/28/13  6:31 PM      Result Value Ref Range   Lipase 30  11 - 59 U/L  PRO B NATRIURETIC PEPTIDE     Status: Abnormal   Collection Time    03/28/13  6:31 PM      Result Value Ref Range   Pro B Natriuretic peptide (BNP) 2988.0 (*) 0 - 450 pg/mL  I-STAT CG4 LACTIC ACID, ED     Status: None   Collection Time     03/28/13  6:36 PM      Result Value Ref Range   Lactic Acid, Venous 1.64  0.5 - 2.2 mmol/L    Ct Abdomen Pelvis W Contrast  03/28/2013   CLINICAL DATA:  Abdominal distention.  EXAM: CT ABDOMEN AND PELVIS WITH CONTRAST  TECHNIQUE: Multidetector CT imaging of the abdomen and pelvis was performed using the standard protocol following bolus administration of intravenous contrast.  CONTRAST:  121m OMNIPAQUE IOHEXOL 300 MG/ML  SOLN  COMPARISON:  DG ABD ACUTE W/CHEST dated 03/28/2013; CT ABD-PELV W/O CM dated 03/11/2013; CT ABD/PELVIS W CM dated 01/30/2013  FINDINGS: There is a new lucency is again noted in the liver. This remains unchanged in appearance and may be infectious in etiology. No clearcut fluid collection in or air collection is noted in the liver to suggest a definite abscess. These changes may be related to phlegmon. Close follow up to evaluate for developing abscess suggested. Hepatic veins patent. Splenic and portal veins patent. No focal significant splenic abnormality. Pancreas normal. Cholecystectomy. No biliary distention.  Adrenals normal. Stable approximate 13 mm low-density lesion in the left kidney, not definite simple cyst. As noted on prior study, MRI of the kidneys suggest for further evaluation. No hydronephrosis. The bladder is nondistended. No free pelvic fluid. Prostate is not enlarged. Calcifications within the prostate. No significant adenopathy. Abdominal aorta is atherosclerotic. No aneurysm. Visceral vessels are patent. Inferior vena caval filter noted in the IVC with tip just below the renal veins.  Previously identified drainage catheter in the right upper quadrant has been removed. Inflammatory changes are noted in the right mid abdomen, no abscess noted. The patient has had a prior hemicolectomy. Reference is made to CT report of 01/31/2012 which discusses fistula changes within the abdomen. Patient status post right hemicolectomy. The colon is moderately distended and contains  fluid levels. These changes have improved from prior exam suggesting improving ileus. These changes may be related to a diarrheal illness. Follow-up abdominal series suggested. No small bowel distention. The stomach is nondistended. No free air noted.  Atelectasis and/or infiltrates in the lung bases. Bilateral pleural effusions. Cardiomegaly. Coronary artery disease. Midline abdominal scar noted. Fluid noted in the region of the scar with associated small amount of air. Developing abscess should be considered. These findings have progressed slightly from prior study. Multiple anterior abdominal wall subcutaneous nodules most likely injection granulomas.  IMPRESSION: 1. Findings consistent with persistent phlegmon in the liver. 2. Interim removal of right upper quadrant drainage catheter. 3. Colonic distention with air-fluid levels. These findings have improved from prior study suggesting improving adynamic ileus or diarrheal illness. Followup abdominal series suggested. 4. Slight progression of soft tissue fluid in the anterior abdomen in the region the patient's scarring. This could represent developing phlegmon/abscess. Small amount of air is noted within this fluid collection. 5. 13 mm low-density lesion left kidney, not definite simple cyst. As noted on prior study nonenhanced and enhanced MRI of the kidneys suggested for further evaluation.   Electronically Signed   By: [place]  On: 03/28/2013 23:29   Dg Abd Acute W/chest  03/28/2013   CLINICAL  DATA:  Evaluate for a bowel obstruction.  EXAM: ACUTE ABDOMEN SERIES (ABDOMEN 2 VIEW & CHEST 1 VIEW)  COMPARISON:  None.  FINDINGS: Tracheostomy tube tip is above the carina. Heart size is mildly enlarged. The lung volumes are low. There is asymmetric elevation of the left hemidiaphragm. Left pleural effusion is noted.  Patient has an IVC filter. There are persistent abnormally dilated loops of small bowel which measure up to 5.5 cm. Gas is noted within the  colon and rectum. There is no evidence of dilated bowel loops or free intraperitoneal air. No radiopaque calculi or other significant radiographic abnormality is seen. Heart size and mediastinal contours are within normal limits. Both lungs are clear.  IMPRESSION: 1. Persistent small bowel dilatation compatible with either partial small bowel obstruction or ileus. 2. No change in aeration to the lungs compared with previous exam   Electronically Signed   By: Kerby Moors M.D.   On: 03/28/2013 18:56    Review of Systems  Constitutional: Negative.   HENT: Negative.   Eyes: Negative.   Respiratory: Negative.   Cardiovascular: Negative.   Gastrointestinal: Positive for abdominal pain.  Genitourinary: Negative.   Musculoskeletal: Negative.   Skin: Negative.   Neurological: Negative.   Endo/Heme/Allergies: Negative.   Psychiatric/Behavioral: Negative.    Blood pressure 107/66, pulse 96, temperature 98.3 F (36.8 C), temperature source Oral, resp. rate 14, SpO2 96.00%. Physical Exam  Constitutional: He is oriented to person, place, and time. He appears well-developed and well-nourished.  HENT:  Head: Normocephalic and atraumatic.  Eyes: Conjunctivae and EOM are normal. Pupils are equal, round, and reactive to light.  Neck: Normal range of motion. Neck supple.  trached  Cardiovascular:  Irregular rhythm  Respiratory: Effort normal and breath sounds normal.  GI: Soft.  Few bowel sounds. He says he has diffuse tenderness but his abdomen is soft and there is no guarding with palpation. Midline incision is healing well with no redness  Musculoskeletal: Normal range of motion.  Neurological: He is alert and oriented to person, place, and time.  Skin: Skin is warm and dry.  Psychiatric: He has a normal mood and affect. His behavior is normal.    Assessment/Plan: The pt is apparently 3 weeks out from an abdominal surgery from [facility] to fix a colocutaneous fistula from [facility]. He has  some element of ileus and some typical postop changes by CT. He has no obvious indication for any urgent surgery tonight. Agree with medical admission. We will follow  TOTH III,Marguriete Wootan S 03/29/2013, 1:57 AM

## 2013-03-30 ENCOUNTER — Inpatient Hospital Stay (HOSPITAL_COMMUNITY): Payer: Medicare HMO

## 2013-03-30 DIAGNOSIS — R109 Unspecified abdominal pain: Secondary | ICD-10-CM

## 2013-03-30 DIAGNOSIS — J9620 Acute and chronic respiratory failure, unspecified whether with hypoxia or hypercapnia: Secondary | ICD-10-CM

## 2013-03-30 LAB — CBC
HCT: 28.5 % — ABNORMAL LOW (ref 39.0–52.0)
HEMOGLOBIN: 8.7 g/dL — AB (ref 13.0–17.0)
MCH: 29.5 pg (ref 26.0–34.0)
MCHC: 30.5 g/dL (ref 30.0–36.0)
MCV: 96.6 fL (ref 78.0–100.0)
Platelets: 238 10*3/uL (ref 150–400)
RBC: 2.95 MIL/uL — AB (ref 4.22–5.81)
RDW: 21.1 % — ABNORMAL HIGH (ref 11.5–15.5)
WBC: 11.4 10*3/uL — ABNORMAL HIGH (ref 4.0–10.5)

## 2013-03-30 LAB — BASIC METABOLIC PANEL
BUN: 18 mg/dL (ref 6–23)
CO2: 24 meq/L (ref 19–32)
CREATININE: 1 mg/dL (ref 0.50–1.35)
Calcium: 8.4 mg/dL (ref 8.4–10.5)
Chloride: 120 mEq/L — ABNORMAL HIGH (ref 96–112)
GFR calc Af Amer: 82 mL/min — ABNORMAL LOW (ref >90)
GFR calc non Af Amer: 70 mL/min — ABNORMAL LOW (ref >90)
Glucose, Bld: 162 mg/dL — ABNORMAL HIGH (ref 70–99)
Potassium: 3.8 mEq/L (ref 3.7–5.3)
Sodium: 155 mEq/L — ABNORMAL HIGH (ref 137–147)

## 2013-03-30 LAB — URINE CULTURE
CULTURE: NO GROWTH
Colony Count: NO GROWTH

## 2013-03-30 LAB — GLUCOSE, CAPILLARY
GLUCOSE-CAPILLARY: 152 mg/dL — AB (ref 70–99)
GLUCOSE-CAPILLARY: 162 mg/dL — AB (ref 70–99)
Glucose-Capillary: 142 mg/dL — ABNORMAL HIGH (ref 70–99)
Glucose-Capillary: 145 mg/dL — ABNORMAL HIGH (ref 70–99)

## 2013-03-30 MED ORDER — METOPROLOL TARTRATE 1 MG/ML IV SOLN
5.0000 mg | Freq: Four times a day (QID) | INTRAVENOUS | Status: DC | PRN
  Administered 2013-03-30 – 2013-04-01 (×4): 5 mg via INTRAVENOUS
  Filled 2013-03-30 (×4): qty 5

## 2013-03-30 MED ORDER — INSULIN ASPART 100 UNIT/ML ~~LOC~~ SOLN
0.0000 [IU] | Freq: Three times a day (TID) | SUBCUTANEOUS | Status: DC
  Administered 2013-03-31 – 2013-04-01 (×2): 1 [IU] via SUBCUTANEOUS
  Administered 2013-04-01 – 2013-04-04 (×2): 2 [IU] via SUBCUTANEOUS
  Administered 2013-04-05 – 2013-04-06 (×2): 1 [IU] via SUBCUTANEOUS
  Administered 2013-04-08: 2 [IU] via SUBCUTANEOUS
  Administered 2013-04-08: 1 [IU] via SUBCUTANEOUS
  Administered 2013-04-09 – 2013-04-13 (×14): 2 [IU] via SUBCUTANEOUS
  Administered 2013-04-14: 3 [IU] via SUBCUTANEOUS
  Administered 2013-04-14: 2 [IU] via SUBCUTANEOUS
  Administered 2013-04-14: 1 [IU] via SUBCUTANEOUS
  Administered 2013-04-15 (×2): 2 [IU] via SUBCUTANEOUS
  Administered 2013-04-15: 1 [IU] via SUBCUTANEOUS
  Administered 2013-04-16: 2 [IU] via SUBCUTANEOUS
  Administered 2013-04-16: 1 [IU] via SUBCUTANEOUS
  Administered 2013-04-16 – 2013-04-17 (×2): 2 [IU] via SUBCUTANEOUS
  Administered 2013-04-17: 1 [IU] via SUBCUTANEOUS
  Administered 2013-04-17 – 2013-04-19 (×7): 2 [IU] via SUBCUTANEOUS
  Administered 2013-04-20: 3 [IU] via SUBCUTANEOUS

## 2013-03-30 MED ORDER — PANTOPRAZOLE SODIUM 40 MG PO TBEC
40.0000 mg | DELAYED_RELEASE_TABLET | Freq: Two times a day (BID) | ORAL | Status: DC
  Administered 2013-03-30: 40 mg via ORAL
  Filled 2013-03-30: qty 1

## 2013-03-30 MED ORDER — PANTOPRAZOLE SODIUM 40 MG IV SOLR
40.0000 mg | Freq: Two times a day (BID) | INTRAVENOUS | Status: DC
  Administered 2013-03-30 – 2013-04-03 (×7): 40 mg via INTRAVENOUS
  Filled 2013-03-30 (×10): qty 40

## 2013-03-30 NOTE — Progress Notes (Signed)
Pt does not want to be suctioned at this time. Pt states he is fine. Pt vitals are stable and SPO2 100%. Pt has good strong cough.

## 2013-03-30 NOTE — Progress Notes (Signed)
Patient ID: Tim Short, male   DOB: 1934-10-26, 79 y.o.   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 Pt was in prolonged respiratory failure at [facility], unable to wean of the vent and requiring trach placement 10/29/2012. He was transferred to [facility] in December 2014 and later to [facility] in late January 2015. He was on TNA and was doing fairly well initially, but developed more abdominal pain and on 2/23 taken to OR in [place] for gallbladder removal (secondary to cholecystitis), resection for part of the colon for fistula, IVC filter placement. Sent back to [facility] on march 4th, 2015 and started on TPN. Diet advanced on March 9th, 2015 after pt passed swallow evaluation. He was brought to [facility] March 16th, after noticing progressive abdominal distension. In ED, CT abdomen with air- fluid level and findings consistent with early partial SBO, ileus. Surgery consulted and [facility] asked to admit for further evaluation.   Principal Problem:   Abdominal pain - secondary to ileus, partial SBO - appreciate surgery input - continue supportive care with IVF, analgesia, antiemetics as needed for symptom control  - keep NPO for now - no indication for surgical intervention at this time  Active Problems:   Atrial fibrillation - rate in 110 - 120's - will continue to monitor on telemetry, continue Cardizem and Pradaxa - add Metoprolol IV PRN for HR > 115 bpm   Leukocytosis - likely secondary to ileus - WBC trending down, repeat CBC in AM    Anemia of chronic disease - Hg drop over the past 24 hours - no signs of active  bleeding - repeat CBC in AM   Essential hypertension - reasonable inpatient control    Tracheostomy status - suctioning as needed   Hypernatremia - secondary to pre renal etiology, Na trending down slowly  - continue IVF, D5 at 50 cc/hr   Moderate malnutrition - secondary to acute on chronic illness as outlined above - keep NPO for now    Hypokalemia - supplemented and K is WNL this AM - repeat BMP in AM  Consultants:  Surgery   Procedures/Studies: Ct Abdomen Pelvis W Contrast  03/28/2013 Findings consistent with persistent phlegmon in the liver.  Interim removal of right upper quadrant drainage catheter. Colonic distention with air-fluid levels. Slight progression of soft tissue fluid in the anterior abdomen in the region the patient's scarring. 13 mm low-density lesion left kidney, not definite simple cyst.  Dg Abd 2 Views  03/30/2013 Mildly dilated loops of left upper quadrant small bowel with scattered internal air-fluid levels, similar to the prior exam, with abnormal but nonspecific pattern which could be due to could ileus or partial SBO.  Dg Abd 2 View 03/29/2013  Nonspecific bowel gas pattern, as above, suggestive of partial small bowel obstruction. No pneumoperitoneum.    Dg Abd Acute W/chest   03/28/2013 Persistent small bowel dilatation compatible with either partial SBO or ileus. No change in aeration to the lungs compared with previous exam.  Antibiotics:  None  Code Status: Full Family Communication: Son at bedside  Disposition Plan: Remains inpatient   HPI/Subjective: No events overnight.   Objective: Filed Vitals:   03/30/13 0102  03/30/13 0955 03/30/13 1120 03/30/13 1408  BP: 112/69   119/80  Pulse: 131 131 122 130  Temp: 98.9 F (37.2 C)   98.2 F (36.8 C)  TempSrc: Oral   Oral  Resp: 18 18 18 18   Height:    5' 8.9" (1.75 m)  Weight:    92.5 kg (203 lb 14.8 oz)  SpO2: 100% 100% 100% 97%    Intake/Output Summary (Last 24 hours) at 03/30/13  1645 Last data filed at 03/30/13 1438  Gross per 24 hour  Intake  572.5 ml  Output   1801 ml  Net -1228.5 ml    Exam:   General:  Pt is alert, follows commands appropriately, weak and frail  Cardiovascular: Irregular rate and rhythm, no murmurs   Respiratory: Clear to auscultation bilaterally, no wheezing, diminished breath sounds at bases   Abdomen: Soft, non tender, slightly distended, no guarding  Extremities: No edema, pulses DP and PT palpable bilaterally  Data Reviewed: Basic Metabolic Panel:  Recent Labs Lab 03/28/13 1831 03/29/13 0446 03/30/13 0305  NA 153* 157* 155*  K 3.3* 3.3* 3.8  CL 116* 121* 120*  CO2 25 23 24   GLUCOSE 221* 154* 162*  BUN 22 20 18   CREATININE 1.09 1.05 1.00  CALCIUM 8.9 8.4 8.4  MG  --  2.2  --    Liver Function Tests:  Recent Labs Lab 03/28/13 1831  AST 26  ALT 30  ALKPHOS 67  BILITOT 0.4  PROT 5.6*  ALBUMIN 2.3*    Recent Labs Lab 03/28/13 1831  LIPASE 30   CBC:  Recent Labs Lab 03/28/13 1831 03/30/13 0305  WBC 15.8* 11.4*  NEUTROABS 13.5*  --   HGB 9.3* 8.7*  HCT 30.1* 28.5*  MCV 95.3 96.6  PLT 265 238   CBG:  Recent Labs Lab 03/29/13 1124 03/29/13 1656 03/29/13 2220 03/30/13 0613 03/30/13 1140  GLUCAP 136* 171* 145* 142* 152*    Recent Results (from the past 240 hour(s))  MRSA PCR SCREENING     Status: None   Collection Time    03/29/13  3:00 AM      Result Value Ref Range Status   MRSA by PCR NEGATIVE  NEGATIVE Final   Comment:            The GeneXpert MRSA Assay (FDA     approved for NASAL specimens     only), is one component of a     comprehensive MRSA colonization     surveillance program. It is not     intended to diagnose MRSA     infection nor to guide or     monitor treatment for     MRSA infections.  URINE CULTURE     Status: None   Collection Time    03/29/13  6:50 AM      Result Value Ref Range Status   Specimen Description URINE, RANDOM   Final   Special Requests NONE    Final   Culture  Setup Time     Final   Value: 03/29/2013 12:43     Performed at [facility] Count     Final   Value: NO GROWTH     Performed at [facility]   Culture     Final   Value: NO GROWTH     Performed at [facility]   Report Status 03/30/2013 FINAL   Final     Scheduled Meds: . atorvastatin  40 mg Oral Daily  . dabigatran  150 mg Oral BID  . diltiazem  240 mg Oral Daily  . insulin aspart  0-5 Units Subcutaneous 4 times per day  . lisinopril  5 mg Oral QHS  . pantoprazole  40 mg Oral BID  . potassium chloride SA  20 mEq Oral Daily  . predniSONE  10 mg Oral Q breakfast  . roflumilast  500 mcg Oral Daily   Continuous Infusions: . dextrose 5 % 1,000 mL with potassium chloride 40 mEq infusion 75 mL/hr at 03/30/13 1438   Debbora PrestoMAGICK-MYERS, ISKRA, MD  [facility] Pager [phone]  If 7PM-7AM, please contact night-coverage [email or web address] Password [email or web address] 03/30/2013, 4:45 PM   LOS: 2 days

## 2013-03-30 NOTE — Progress Notes (Signed)
[facility]ed op notes from [facility].  No acute surgical issues at this point.  Pt moving bowels.  Will follow along for now but he may have a mild ileus.

## 2013-03-30 NOTE — Progress Notes (Signed)
Pt OTF in x-ray. Will do trach check when pt returns.

## 2013-03-30 NOTE — Progress Notes (Addendum)
INITIAL NUTRITION ASSESSMENT  DOCUMENTATION CODES Per approved criteria  -Obesity Unspecified   INTERVENTION: Advance diet as medically appropriate, add supplements when/as able RD to follow for nutrition care plan  NUTRITION DIAGNOSIS: Inadequate oral intake related to inability to eat as evidenced by NPO status  Goal: Pt to meet >/= 90% of their estimated nutrition needs   Monitor:  PO diet advancement & intake, weight, labs, I/O's  Reason for Assessment: Malnutrition Screening Tool Report  79 y.o. male  Admitting Dx: Abdominal pain  ASSESSMENT: 79 yo male 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   Has been on TPN, with NGT on/off since. Is at [facility] and [facility] of [facility]. Pt was taken off TPN and NGT was removed last week. He has been eating by mouth and doing well. Last 4 days with worsening abd pain, no N/V/D.  ABDOMEN SERIES IMPRESSION 3/16: 1. Persistent small bowel dilatation compatible with either partial small bowel obstruction or ileus.  Patient is on trach collar -- currently in DIAGNOSTIC RADIOLOGY; H&P reviewed -- pt with hx of being on TPN; spoke with pt's son -- reports pt was receiving TPN for several weeks; recently started receiving solid food < 1 week ago and now he's developed "this new abdomen problem;" plan is for bowel rest; if nausea or vomiting persists NGT to LIS to be placed.  Height: Ht Readings from Last 1 Encounters:  06/24/12 5' 8.9" (1.75 m)    Weight: Wt Readings from Last 1 Encounters:  03/29/13 203 lb 14.8 oz (92.5 kg)    Ideal Body Weight: 154 lb  % Ideal Body Weight: 132%  Wt Readings from Last 10 Encounters:  03/29/13 203 lb 14.8 oz (92.5 kg)  06/24/12 244 lb 11.4 oz (111 kg)  06/05/12 251 lb 15.8 oz (114.3 kg)  06/05/12 251 lb 15.8 oz (114.3 kg)   05/26/12 241 lb (109.317 kg)  05/26/12 241 lb (109.317 kg)  04/09/12 241 lb 3.2 oz (109.408 kg)  11/07/11 220 lb (99.791 kg)  11/07/11 220 lb (99.791 kg)  10/02/11 220 lb (99.791 kg)    Usual Body Weight: 244 lb -- June 2014  % Usual Body Weight: 83%  BMI:  Body mass index is 30.2 kg/(m^2).  Estimated Nutritional Needs: Kcal: 2050-2250 Protein: 110-120 gm Fluid: 2.0-2.2 L  Skin: Intact  Diet Order: NPO  EDUCATION NEEDS: -No education needs identified at this time   Intake/Output Summary (Last 24 hours) at 03/30/13 1138 Last data filed at 03/30/13 0800  Gross per 24 hour  Intake     75 ml  Output   1151 ml  Net  -1076 ml    Labs:   Recent Labs Lab 03/28/13 1831 03/29/13 0446 03/30/13 0305  NA 153* 157* 155*  K 3.3* 3.3* 3.8  CL 116* 121* 120*  CO2 25 23 24   BUN 22 20 18   CREATININE 1.09 1.05 1.00  CALCIUM 8.9 8.4 8.4  MG  --  2.2  --   GLUCOSE 221* 154* 162*    CBG (last 3)   Recent Labs  03/29/13 1656 03/29/13 2220 03/30/13 0613  GLUCAP 171* 145* 142*    Scheduled Meds: . atorvastatin  40 mg Oral Daily  . chlorhexidine  15 mL Mouth/Throat BID  . dabigatran  150 mg Oral BID  . diltiazem  240 mg Oral Daily  .  insulin aspart  0-5 Units Subcutaneous 4 times per day  . lisinopril  5 mg Oral QHS  . pantoprazole sodium  40 mg Per Tube BID  . potassium chloride SA  20 mEq Oral Daily  . predniSONE  10 mg Oral Q breakfast  . roflumilast  500 mcg Oral Daily  . sodium chloride  3 mL Intravenous Q12H    Continuous Infusions: . dextrose 5 % 1,000 mL with potassium chloride 40 mEq infusion 75 mL/hr at 03/30/13 0130    Past Medical History  Diagnosis Date  . Hypertension   . Hypercholesteremia   . Asthma   . Meniere disease   . Persistent atrial fibrillation   . Obesity   . Biatrial enlargement     LA size 5.3cm  . Obstructive sleep apnea     AHI 108/hr now on CPAP at 12cm H2O  . Chest pain, unspecified     "just recently" (2012-06-25)   . H/O hiatal hernia   . Shortness of breath     "associated w/asthma & AF; mostly w/exertion" (2012-06-25)  . GERD (gastroesophageal reflux disease)     "associated w/hiatal hernia" (2012-06-25)  . Peptic ulcer 1950's  . Migraines     "haven't had one for about 15 years" (06-25-2012)  . Arthritis     "joints" (2012-06-25)  . Chronic lower back pain   . Kidney stones ~ 1960's    "passed on their own" (Jun 25, 2012)  . Pneumonia 2002; 2006    "spent 8 days in isolation; Norovirus" (06-25-12)  . Other and unspecified angina pectoris   . RLS (restless legs syndrome)   . Coronary artery disease   . Diabetes mellitus without complication     FAMILY STATES PATIENT IS NOT DIABETIC    Past Surgical History  Procedure Laterality Date  . Wrist fracture surgery  1992    "repaired w/left hip bone graft" (25-Jun-2012)  . Total knee arthroplasty  08/19/1999  . Colonoscopy w/ polypectomy  2006  . Knee arthroscopy with meniscal repair Right 1974    "medial meniscus repaired" (06-25-2012)  . Cardioversion  10/02/2011    Procedure: CARDIOVERSION;  Surgeon: Corky Crafts, MD;  [facility] ENDOSCOPY;  Service: Cardiovascular;  Laterality: N/A;  h/p in file drawer  . Cardioversion  11/07/2011    Procedure: CARDIOVERSION;  Surgeon: Corky Crafts, MD;  Location: [facility] ENDOSCOPY;  Service: Cardiovascular;  Laterality: N/A;  h/p from 10/22 in file drawer/dl  . Cardiac catheterization  05/26/2012  . Coronary angioplasty with stent placement  06/25/2012    "1" (06-25-2012)  . Cataract extraction w/ intraocular lens  implant, bilateral  2009  . Hemorrhoid surgery  ~ 2003  . Hiatal hernia repair  1983  . Nissen fundoplication  1983  . Ablation of dysrhythmic focus  SEPT/OCT 2014    ATRIAL FIB  . Inguinal hernia repair Bilateral 2000 and 2005    "one done at a time" (2012/06/25)  . Colon surgery  10/08/2012   . Tracheostomy  OCT 2014    Maureen Chatters, RD, LDN Pager #: [phone] After-Hours Pager #:  [phone]

## 2013-03-30 NOTE — Progress Notes (Signed)
Patient ID: Tim Short, male   DOB: [birth year], 79 y.o.   MRN: [id number]   Subjective: Abdominal pain is about the same, +nausea, no vomiting.  BM this morning.   Appears to be in mild respiratory distress, RN notified  Objective:  Vital signs:  Filed Vitals:   03/29/13 2301 03/30/13 0404 03/30/13 0519 03/30/13 0955  BP:   112/69   Pulse: 127 130 131 131  Temp:   98.9 F (37.2 C)   TempSrc:   Oral   Resp: _0 Weight:      SpO2: 100% 98% 100% 100%    Last BM Date: 03/29/13  Intake/Output   Yesterday:  03/17 0701 - 03/18 0700 In: 0  Out: 1600 [Urine:1600] This shift:  Total I/O In: 0  Out: 1 [Stool:1]  Physical Exam: General: Pt awake/alert/oriented x3 in mild acute distress Abdomen: Soft.  ditended. Healed incision except for inferior aspect, there is yellow drainage at the site  No evidence of peritonitis.  No incarcerated hernias.   Problem List:   Principal Problem:   Abdominal pain Active Problems:   Atrial fibrillation   Essential hypertension   Tracheostomy status   Ileus   Hypernatremia    Results:   Labs: Results for orders placed during the hospital encounter of 03/28/13 (from the past 48 hour(s))  CBC WITH DIFFERENTIAL     Status: Abnormal   Collection Time    03/28/13  6:31 PM      Result Value Ref Range   WBC 15.8 (*) 4.0 - 10.5 K/uL   RBC 3.16 (*) 4.22 - 5.81 MIL/uL   Hemoglobin 9.3 (*) 13.0 - 17.0 g/dL   HCT 30.1 (*) 39.0 - 52.0 %   MCV 95.3  78.0 - 100.0 fL   MCH 29.4  26.0 - 34.0 pg   MCHC 30.9  30.0 - 36.0 g/dL   RDW 20.4 (*) 11.5 - 15.5 %   Platelets 265  150 - 400 K/uL   Neutrophils Relative % 85 (*) 43 - 77 %   Lymphocytes Relative 9 (*) 12 - 46 %   Monocytes Relative 6  3 - 12 %   Eosinophils Relative 0  0 - 5 %   Basophils Relative 0  0 - 1 %   Neutro Abs 13.5 (*) 1.7 - 7.7 K/uL   Lymphs Abs 1.4  0.7 - 4.0 K/uL   Monocytes Absolute 0.9  0.1 - 1.0 K/uL   Eosinophils Absolute 0.0  0.0 - 0.7 K/uL   Basophils  Absolute 0.0  0.0 - 0.1 K/uL   WBC Morphology MILD LEFT SHIFT (1-5% METAS, OCC MYELO, OCC BANDS)    COMPREHENSIVE METABOLIC PANEL     Status: Abnormal   Collection Time    03/28/13  6:31 PM      Result Value Ref Range   Sodium 153 (*) 137 - 147 mEq/L   Potassium 3.3 (*) 3.7 - 5.3 mEq/L   Chloride 116 (*) 96 - 112 mEq/L   CO2 25  19 - 32 mEq/L   Glucose, Bld 221 (*) 70 - 99 mg/dL   BUN 22  6 - 23 mg/dL   Creatinine, Ser 1.09  0.50 - 1.35 mg/dL   Calcium 8.9  8.4 - 10.5 mg/dL   Total Protein 5.6 (*) 6.0 - 8.3 g/dL   Albumin 2.3 (*) 3.5 - 5.2 g/dL   AST 26  0 - 37 U/L   ALT 30  0 - 53  U/L   Alkaline Phosphatase 67  39 - 117 U/L   Total Bilirubin 0.4  0.3 - 1.2 mg/dL   GFR calc non Af Amer 63 (*) >90 mL/min   GFR calc Af Amer 74 (*) >90 mL/min   Comment: (NOTE)     The eGFR has been calculated using the CKD EPI equation.     This calculation has not been validated in all clinical situations.     eGFR's persistently <90 mL/min signify possible Chronic Kidney     Disease.  LIPASE, BLOOD     Status: None   Collection Time    03/28/13  6:31 PM      Result Value Ref Range   Lipase 30  11 - 59 U/L  PRO B NATRIURETIC PEPTIDE     Status: Abnormal   Collection Time    03/28/13  6:31 PM      Result Value Ref Range   Pro B Natriuretic peptide (BNP) 2988.0 (*) 0 - 450 pg/mL  I-STAT CG4 LACTIC ACID, ED     Status: None   Collection Time    03/28/13  6:36 PM      Result Value Ref Range   Lactic Acid, Venous 1.64  0.5 - 2.2 mmol/L  MRSA PCR SCREENING     Status: None   Collection Time    03/29/13  3:00 AM      Result Value Ref Range   MRSA by PCR NEGATIVE  NEGATIVE   Comment:            The GeneXpert MRSA Assay (FDA     approved for NASAL specimens     only), is one component of a     comprehensive MRSA colonization     surveillance program. It is not     intended to diagnose MRSA     infection nor to guide or     monitor treatment for     MRSA infections.  GLUCOSE, CAPILLARY      Status: Abnormal   Collection Time    03/29/13  4:27 AM      Result Value Ref Range   Glucose-Capillary 145 (*) 70 - 99 mg/dL  MAGNESIUM     Status: None   Collection Time    03/29/13  4:46 AM      Result Value Ref Range   Magnesium 2.2  1.5 - 2.5 mg/dL  BASIC METABOLIC PANEL     Status: Abnormal   Collection Time    03/29/13  4:46 AM      Result Value Ref Range   Sodium 157 (*) 137 - 147 mEq/L   Potassium 3.3 (*) 3.7 - 5.3 mEq/L   Chloride 121 (*) 96 - 112 mEq/L   CO2 23  19 - 32 mEq/L   Glucose, Bld 154 (*) 70 - 99 mg/dL   BUN 20  6 - 23 mg/dL   Creatinine, Ser 1.05  0.50 - 1.35 mg/dL   Calcium 8.4  8.4 - 10.5 mg/dL   GFR calc non Af Amer 66 (*) >90 mL/min   GFR calc Af Amer 77 (*) >90 mL/min   Comment: (NOTE)     The eGFR has been calculated using the CKD EPI equation.     This calculation has not been validated in all clinical situations.     eGFR's persistently <90 mL/min signify possible Chronic Kidney     Disease.  VANCOMYCIN, RANDOM     Status: None   Collection Time  03/29/13  4:46 AM      Result Value Ref Range   Vancomycin Rm 20.9     Comment:            Random Vancomycin therapeutic     range is dependent on dosage and     time of specimen collection.     A peak range is 20.0-40.0 ug/mL     A trough range is 5.0-15.0 ug/mL             GLUCOSE, CAPILLARY     Status: Abnormal   Collection Time    03/29/13  6:13 AM      Result Value Ref Range   Glucose-Capillary 129 (*) 70 - 99 mg/dL  URINALYSIS, ROUTINE W REFLEX MICROSCOPIC     Status: Abnormal   Collection Time    03/29/13  6:50 AM      Result Value Ref Range   Color, Urine YELLOW  YELLOW   APPearance CLEAR  CLEAR   Specific Gravity, Urine 1.041 (*) 1.005 - 1.030   pH 5.0  5.0 - 8.0   Glucose, UA NEGATIVE  NEGATIVE mg/dL   Hgb urine dipstick TRACE (*) NEGATIVE   Bilirubin Urine NEGATIVE  NEGATIVE   Ketones, ur NEGATIVE  NEGATIVE mg/dL   Protein, ur NEGATIVE  NEGATIVE mg/dL   Urobilinogen, UA  0.2  0.0 - 1.0 mg/dL   Nitrite NEGATIVE  NEGATIVE   Leukocytes, UA NEGATIVE  NEGATIVE  URINE CULTURE     Status: None   Collection Time    03/29/13  6:50 AM      Result Value Ref Range   Specimen Description URINE, RANDOM     Special Requests NONE     Culture  Setup Time       Value: 03/29/2013 12:43     Performed at [facility]       Value: NO GROWTH     Performed at [facility]   Culture       Value: NO GROWTH     Performed at [facility]   Report Status 03/30/2013 FINAL    URINE MICROSCOPIC-ADD ON     Status: Abnormal   Collection Time    03/29/13  6:50 AM      Result Value Ref Range   WBC, UA 0-2  <3 WBC/hpf   RBC / HPF 3-6  <3 RBC/hpf   Bacteria, UA RARE  RARE   Casts HYALINE CASTS (*) NEGATIVE  GLUCOSE, CAPILLARY     Status: Abnormal   Collection Time    03/29/13 11:24 AM      Result Value Ref Range   Glucose-Capillary 136 (*) 70 - 99 mg/dL  GLUCOSE, CAPILLARY     Status: Abnormal   Collection Time    03/29/13  4:56 PM      Result Value Ref Range   Glucose-Capillary 171 (*) 70 - 99 mg/dL   Comment 1 Documented in Chart     Comment 2 Notify RN    GLUCOSE, CAPILLARY     Status: Abnormal   Collection Time    03/29/13 10:20 PM      Result Value Ref Range   Glucose-Capillary 145 (*) 70 - 99 mg/dL   Comment 1 Documented in Chart     Comment 2 Notify RN    CBC     Status: Abnormal   Collection Time    03/30/13  3:05 AM  Result Value Ref Range   WBC 11.4 (*) 4.0 - 10.5 K/uL   RBC 2.95 (*) 4.22 - 5.81 MIL/uL   Hemoglobin 8.7 (*) 13.0 - 17.0 g/dL   HCT 28.5 (*) 39.0 - 52.0 %   MCV 96.6  78.0 - 100.0 fL   MCH 29.5  26.0 - 34.0 pg   MCHC 30.5  30.0 - 36.0 g/dL   RDW 21.1 (*) 11.5 - 15.5 %   Platelets 238  150 - 400 K/uL  BASIC METABOLIC PANEL     Status: Abnormal   Collection Time    03/30/13  3:05 AM      Result Value Ref Range   Sodium 155 (*) 137 - 147 mEq/L   Potassium 3.8  3.7 - 5.3 mEq/L   Chloride 120 (*)  96 - 112 mEq/L   CO2 24  19 - 32 mEq/L   Glucose, Bld 162 (*) 70 - 99 mg/dL   BUN 18  6 - 23 mg/dL   Creatinine, Ser 1.00  0.50 - 1.35 mg/dL   Calcium 8.4  8.4 - 10.5 mg/dL   GFR calc non Af Amer 70 (*) >90 mL/min   GFR calc Af Amer 82 (*) >90 mL/min   Comment: (NOTE)     The eGFR has been calculated using the CKD EPI equation.     This calculation has not been validated in all clinical situations.     eGFR's persistently <90 mL/min signify possible Chronic Kidney     Disease.  GLUCOSE, CAPILLARY     Status: Abnormal   Collection Time    03/30/13  6:13 AM      Result Value Ref Range   Glucose-Capillary 142 (*) 70 - 99 mg/dL    Imaging / Studies: Ct Abdomen Pelvis W Contrast  03/28/2013   CLINICAL DATA:  Abdominal distention.  EXAM: CT ABDOMEN AND PELVIS WITH CONTRAST  TECHNIQUE: Multidetector CT imaging of the abdomen and pelvis was performed using the standard protocol following bolus administration of intravenous contrast.  CONTRAST:  138m OMNIPAQUE IOHEXOL 300 MG/ML  SOLN  COMPARISON:  DG ABD ACUTE W/CHEST dated 03/28/2013; CT ABD-PELV W/O CM dated 03/11/2013; CT ABD/PELVIS W CM dated 01/30/2013  FINDINGS: There is a new lucency is again noted in the liver. This remains unchanged in appearance and may be infectious in etiology. No clearcut fluid collection in or air collection is noted in the liver to suggest a definite abscess. These changes may be related to phlegmon. Close follow up to evaluate for developing abscess suggested. Hepatic veins patent. Splenic and portal veins patent. No focal significant splenic abnormality. Pancreas normal. Cholecystectomy. No biliary distention.  Adrenals normal. Stable approximate 13 mm low-density lesion in the left kidney, not definite simple cyst. As noted on prior study, MRI of the kidneys suggest for further evaluation. No hydronephrosis. The bladder is nondistended. No free pelvic fluid. Prostate is not enlarged. Calcifications within the prostate.  No significant adenopathy. Abdominal aorta is atherosclerotic. No aneurysm. Visceral vessels are patent. Inferior vena caval filter noted in the IVC with tip just below the renal veins.  Previously identified drainage catheter in the right upper quadrant has been removed. Inflammatory changes are noted in the right mid abdomen, no abscess noted. The patient has had a prior hemicolectomy. Reference is made to CT report of 01/31/2012 which discusses fistula changes within the abdomen. Patient status post right hemicolectomy. The colon is moderately distended and contains fluid levels. These changes have improved from prior  exam suggesting improving ileus. These changes may be related to a diarrheal illness. Follow-up abdominal series suggested. No small bowel distention. The stomach is nondistended. No free air noted.  Atelectasis and/or infiltrates in the lung bases. Bilateral pleural effusions. Cardiomegaly. Coronary artery disease. Midline abdominal scar noted. Fluid noted in the region of the scar with associated small amount of air. Developing abscess should be considered. These findings have progressed slightly from prior study. Multiple anterior abdominal wall subcutaneous nodules most likely injection granulomas.  IMPRESSION: 1. Findings consistent with persistent phlegmon in the liver. 2. Interim removal of right upper quadrant drainage catheter. 3. Colonic distention with air-fluid levels. These findings have improved from prior study suggesting improving adynamic ileus or diarrheal illness. Followup abdominal series suggested. 4. Slight progression of soft tissue fluid in the anterior abdomen in the region the patient's scarring. This could represent developing phlegmon/abscess. Small amount of air is noted within this fluid collection. 5. 13 mm low-density lesion left kidney, not definite simple cyst. As noted on prior study nonenhanced and enhanced MRI of the kidneys suggested for further evaluation.    Electronically Signed   By: Marcello Moores  Register   On: 03/28/2013 23:29   Dg Abd 2 Views  03/30/2013   CLINICAL DATA:  Colonic distention.  Multiple abdominal surgeries.  EXAM: ABDOMEN - 2 VIEW  COMPARISON:  DG ABD 2 VIEWS dated 03/29/2013; CT ABD/PELVIS W CM dated 03/28/2013  FINDINGS: Trace blunting of the right costophrenic angle. No free intraperitoneal gas.  Dilated loops of left upper quadrant small bowel measure up to 4 cm, similar to the prior exam. There are several air-fluid levels within these small bowel loops. Reduced colonic gas compared to the prior exam. IVC filter noted.  IMPRESSION: 1. Mildly dilated loops of left upper quadrant small bowel with scattered internal air-fluid levels, similar to the prior exam, with abnormal but nonspecific pattern which could be due to could ileus or partial small bowel obstruction. Correlate with bowel sounds and bowel function.   Electronically Signed   By: Sherryl Barters M.D.   On: 03/30/2013 10:43   Dg Abd 2 Views  03/29/2013   CLINICAL DATA:  Lower abdominal pain. Nausea. Shortness of breath. Weakness.  EXAM: ABDOMEN - 2 VIEW  COMPARISON:  Abdominal radiograph 03/28/2013.  FINDINGS: Some colonic gas is noted. However, there are multiple borderline dilated of mildly dilated loops of gas-filled small bowel measuring up to 4.2 cm in diameter throughout the central abdomen and left side of the abdomen. No pneumoperitoneum appreciated on the left lateral decubitus view. Several small air-fluid levels are noted. Residual iodinated contrast material is noted within the lumen of the urinary bladder related to yesterday's CT examination. Numerous surgical clips are noted in the right upper quadrant of the abdomen. IVC filter projecting over either the right side at L2-L3.  IMPRESSION: 1. Nonspecific bowel gas pattern, as above, suggestive of partial small bowel obstruction. 2. No pneumoperitoneum.   Electronically Signed   By: Vinnie Langton M.D.   On: 03/29/2013  13:38   Dg Abd Acute W/chest  03/28/2013   CLINICAL DATA:  Evaluate for a bowel obstruction.  EXAM: ACUTE ABDOMEN SERIES (ABDOMEN 2 VIEW & CHEST 1 VIEW)  COMPARISON:  None.  FINDINGS: Tracheostomy tube tip is above the carina. Heart size is mildly enlarged. The lung volumes are low. There is asymmetric elevation of the left hemidiaphragm. Left pleural effusion is noted.  Patient has an IVC filter. There are persistent abnormally dilated loops of  small bowel which measure up to 5.5 cm. Gas is noted within the colon and rectum. There is no evidence of dilated bowel loops or free intraperitoneal air. No radiopaque calculi or other significant radiographic abnormality is seen. Heart size and mediastinal contours are within normal limits. Both lungs are clear.  IMPRESSION: 1. Persistent small bowel dilatation compatible with either partial small bowel obstruction or ileus. 2. No change in aeration to the lungs compared with previous exam   Electronically Signed   By: Kerby Moors M.D.   On: 03/28/2013 18:56    Medications / Allergies: per chart  Antibiotics: Anti-infectives   Start     Dose/Rate Route Frequency Ordered Stop   03/29/13 1000  vancomycin (VANCOCIN) 1,500 mg in sodium chloride 0.9 % 500 mL IVPB  Status:  Discontinued     1,500 mg 250 mL/hr over 120 Minutes Intravenous Daily 03/29/13 0245 03/29/13 1117   03/29/13 0600  piperacillin-tazobactam (ZOSYN) IVPB 3.375 g  Status:  Discontinued     3.375 g 12.5 mL/hr over 240 Minutes Intravenous 3 times per day 03/29/13 0301 03/29/13 1117   03/29/13 0245  piperacillin-tazobactam (ZOSYN) injection 3.375 g  Status:  Discontinued     3.375 g Intramuscular Every 6 hours 03/29/13 0245 03/29/13 0300      Assessment/Plan Hx Rt hemicolectomy secondary to colonic volvulus 09/2012 Abdominal pain AXR with dilated bowel loops Clinically does not appear obstructed, having BMs Dr. Brantley Stage to review operative notes from [facility] Continue bowel rest, NGT  if nausea persists or vomiting develops.   Daily dressing changes to abdomen  Erby Pian, [facility] Pager [phone] Office [phone]  03/30/2013 11:47 AM

## 2013-03-30 NOTE — Progress Notes (Signed)
Trach suctioned at this time; HR up to 130; sats 96%; will administer AM meds; pt with productive cough; will cont. To monitor.

## 2013-03-31 LAB — BASIC METABOLIC PANEL
BUN: 13 mg/dL (ref 6–23)
CALCIUM: 8.5 mg/dL (ref 8.4–10.5)
CO2: 25 meq/L (ref 19–32)
CREATININE: 0.97 mg/dL (ref 0.50–1.35)
Chloride: 116 mEq/L — ABNORMAL HIGH (ref 96–112)
GFR calc non Af Amer: 78 mL/min — ABNORMAL LOW (ref >90)
GFR, EST AFRICAN AMERICAN: 90 mL/min — AB (ref >90)
Glucose, Bld: 136 mg/dL — ABNORMAL HIGH (ref 70–99)
Potassium: 3.7 mEq/L (ref 3.7–5.3)
Sodium: 151 mEq/L — ABNORMAL HIGH (ref 137–147)

## 2013-03-31 LAB — CBC
HCT: 28.8 % — ABNORMAL LOW (ref 39.0–52.0)
Hemoglobin: 8.8 g/dL — ABNORMAL LOW (ref 13.0–17.0)
MCH: 29.4 pg (ref 26.0–34.0)
MCHC: 30.6 g/dL (ref 30.0–36.0)
MCV: 96.3 fL (ref 78.0–100.0)
PLATELETS: 239 10*3/uL (ref 150–400)
RBC: 2.99 MIL/uL — ABNORMAL LOW (ref 4.22–5.81)
RDW: 21 % — AB (ref 11.5–15.5)
WBC: 11.3 10*3/uL — AB (ref 4.0–10.5)

## 2013-03-31 LAB — GLUCOSE, CAPILLARY
Glucose-Capillary: 128 mg/dL — ABNORMAL HIGH (ref 70–99)
Glucose-Capillary: 118 mg/dL — ABNORMAL HIGH (ref 70–99)
Glucose-Capillary: 144 mg/dL — ABNORMAL HIGH (ref 70–99)

## 2013-03-31 NOTE — Progress Notes (Signed)
Patient ID: Tim Short, male   DOB: [birth year], 79 y.o.   MRN: [id number] [facility] PROGRESS NOTE  OLIVERIO CHO LKG:[id number] DOB: [birth year] DOA: 03/28/2013 PCP:  Duane Lope, MD  Brief narrative:  Pt is 79 yo male with fairly recent hospitalization at [facility] in September 2014 for failed maize procedure and during that hospitalization pt developed volvulus and has required right colectomy (10/08/2012), subsequently developed multiple abscesses and enteric fistulas managed percutaneous drains. Pt was in prolonged respiratory failure at [facility], unable to wean of the vent and requiring trach placement 10/29/2012. He was transferred to [facility] in December 2014 and later to [facility] in late January 2015. He was on TNA and was doing fairly well initially, but developed more abdominal pain and on 2/23 taken to OR in [place] for gallbladder removal (secondary to cholecystitis), resection for part of the colon for fistula, IVC filter placement. Sent back to [facility] on march 4th, 2015 and started on TPN. Diet advanced on March 9th, 2015 after pt passed swallow evaluation. He was brought to [facility] March 16th, after noticing progressive abdominal distension. In ED, CT abdomen with air- fluid level and findings consistent with early partial SBO, ileus. Surgery consulted and [facility] asked to admit for further evaluation.   Principal Problem:  Abdominal pain  - secondary to ileus, partial SBO  - appreciate surgery input  - pt clinically stable this AM and appears to be slightly better compared to yesterday  - continue supportive care with IVF, analgesia, antiemetics as needed for symptom control  - keep NPO for now  - no indication for surgical intervention at this time  Active Problems:  Atrial fibrillation  - rate in 120's - will continue to monitor on telemetry, continue Cardizem and Pradaxa  - increase the dose of Cardizem  - add Metoprolol IV PRN for HR > 115 bpm  Leukocytosis  - likely secondary to ileus   - WBC trending down, repeat CBC in AM  Anemia of chronic disease  - Hg stable over the past 24 hours  - no signs of active bleeding  - repeat CBC in AM  Essential hypertension  - reasonable inpatient control but slightly on soft side this AM - close monitoring  Tracheostomy status  - suctioning as needed  Hypernatremia  - secondary to pre renal etiology, Na trending down slowly  - continue IVF, D5 at 50 cc/hr  Moderate malnutrition  - secondary to acute on chronic illness as outlined above  - keep NPO for now  Hypokalemia  - supplemented and K is WNL this AM  - repeat BMP in AM   Consultants:  Surgery  Procedures/Studies:  Ct Abdomen Pelvis W Contrast 03/28/2013 Findings consistent with persistent phlegmon in the liver. Interim removal of right upper quadrant drainage catheter. Colonic distention with air-fluid levels. Slight progression of soft tissue fluid in the anterior abdomen in the region the patient's scarring. 13 mm low-density lesion left kidney, not definite simple cyst. Dg Abd 2 Views 03/30/2013 Mildly dilated loops of left upper quadrant small bowel with scattered internal air-fluid levels, similar to the prior exam, with abnormal but nonspecific pattern which could be due to could ileus or partial SBO. Dg Abd 2 View 03/29/2013 Nonspecific bowel gas pattern, as above, suggestive of partial small bowel obstruction. No pneumoperitoneum.  Dg Abd Acute W/chest 03/28/2013 Persistent small bowel dilatation compatible with either partial SBO or ileus. No change in aeration to the lungs compared with previous exam. Antibiotics:  None  Code Status: Full  Family Communication: Son at bedside  Disposition Plan: Remains inpatient   HPI/Subjective: No events overnight. Pt reports nausea this AM.   Objective: Filed Vitals:   03/31/13 1008 03/31/13 1140 03/31/13 1445 03/31/13 1602  BP: 108/78  109/62   Pulse: 122 131 130 131  Temp:   97 F (36.1 C)   TempSrc:   Oral   Resp:  22 18 19 18   Height:      Weight:      SpO2: 98%  90%     Intake/Output Summary (Last 24 hours) at 03/31/13 1631 Last data filed at 03/31/13 1054  Gross per 24 hour  Intake 819.25 ml  Output   2500 ml  Net -1680.75 ml    Exam:   General:  Pt is alert, follows commands appropriately, not in acute distress  Cardiovascular: Irregular rate and rhythm,, no rubs, no gallops  Respiratory: Clear to auscultation bilaterally, no wheezing, diminished breath sounds at bases   Abdomen: Soft, slightly tender in epigastric area, non distended, bowel sounds faint  Extremities: No edema, pulses DP and PT palpable bilaterally  Data Reviewed: Basic Metabolic Panel:  Recent Labs Lab 03/28/13 1831 03/29/13 0446 03/30/13 0305 03/31/13 0555  NA 153* 157* 155* 151*  K 3.3* 3.3* 3.8 3.7  CL 116* 121* 120* 116*  CO2 25 23 24 25   GLUCOSE 221* 154* 162* 136*  BUN 22 20 18 13   CREATININE 1.09 1.05 1.00 0.97  CALCIUM 8.9 8.4 8.4 8.5  MG  --  2.2  --   --    Liver Function Tests:  Recent Labs Lab 03/28/13 1831  AST 26  ALT 30  ALKPHOS 67  BILITOT 0.4  PROT 5.6*  ALBUMIN 2.3*    Recent Labs Lab 03/28/13 1831  LIPASE 30   CBC:  Recent Labs Lab 03/28/13 1831 03/30/13 0305 03/31/13 0555  WBC 15.8* 11.4* 11.3*  NEUTROABS 13.5*  --   --   HGB 9.3* 8.7* 8.8*  HCT 30.1* 28.5* 28.8*  MCV 95.3 96.6 96.3  PLT 265 238 239   CBG:  Recent Labs Lab 03/30/13 1140 03/30/13 1700 03/30/13 2051 03/31/13 0615 03/31/13 1130  GLUCAP 152* 162* 145* 128* 118*    Recent Results (from the past 240 hour(s))  MRSA PCR SCREENING     Status: None   Collection Time    03/29/13  3:00 AM      Result Value Ref Range Status   MRSA by PCR NEGATIVE  NEGATIVE Final   Comment:            The GeneXpert MRSA Assay (FDA     approved for NASAL specimens     only), is one component of a     comprehensive MRSA colonization     surveillance program. It is not     intended to diagnose MRSA      infection nor to guide or     monitor treatment for     MRSA infections.  URINE CULTURE     Status: None   Collection Time    03/29/13  6:50 AM      Result Value Ref Range Status   Specimen Description URINE, RANDOM   Final   Special Requests NONE   Final   Culture  Setup Time     Final   Value: 03/29/2013 12:43     Performed at [facility]   Colony Count     Final   Value:  NO GROWTH     [facility]formed at [facility]   Culture     Final   Value: NO GROWTH     Performed at [facility]   Report Status 03/30/2013 FINAL   Final     Scheduled Meds: . atorvastatin  40 mg Oral Daily  . chlorhexidine  15 mL Mouth/Throat BID  . dabigatran  150 mg Oral BID  . diltiazem  240 mg Oral Daily  . insulin aspart  0-9 Units Subcutaneous TID WC  . lisinopril  5 mg Oral QHS  . pantoprazole (PROTONIX) IV  40 mg Intravenous Q12H  . predniSONE  10 mg Oral Q breakfast  . roflumilast  500 mcg Oral Daily  . sodium chloride  3 mL Intravenous Q12H   Continuous Infusions: . dextrose 5 % 1,000 mL with potassium chloride 40 mEq infusion 50 mL/hr at 03/31/13 1019   Debbora PrestoMAGICK-MYERS, ISKRA, MD  [facility] Pager [phone]  If 7PM-7AM, please contact night-coverage [email or web address] Password [email or web address] 03/31/2013, 4:31 PM   LOS: 3 days

## 2013-03-31 NOTE — Progress Notes (Signed)
PATIENT'S H.R. 130 'S AND RESTING IN BED. GAVE PRN LOPRESSOR 5MG  I.V. AND H.R. DOWN TO 90-LOW 100'S R.N. AWARE CONT. TO MONITOR H.R. AND PATIENT.

## 2013-03-31 NOTE — Progress Notes (Signed)
Subjective: Much more awake this am.  Looks much better.    Objective: Vital signs in last 24 hours: Temp:  [98.2 F (36.8 C)-98.3 F (36.8 C)] 98.2 F (36.8 C) (03/19 0349) Pulse Rate:  [116-132] 116 (03/19 0751) Resp:  [18-20] 18 (03/19 0751) BP: (104-119)/(76-80) 104/76 mmHg (03/19 0609) SpO2:  [97 %-100 %] 100 % (03/19 0751) FiO2 (%):  [40 %-60 %] 60 % (03/19 0751) Weight:  [203 lb 14.8 oz (92.5 kg)] 203 lb 14.8 oz (92.5 kg) April 01, 2022 1408) Last BM Date: 2013/03/31  Intake/Output from previous day: 01-Apr-2022 0701 - 03/19 0700 In: 1391.8 [I.V.:1391.8] Out: 2351 [Urine:2350; Stool:1] Intake/Output this shift:    Incision/Wound:abdomen soft obese non tender.  Wound open at inferior portion but clean.  No peritonitis.   Lab Results:   Recent Labs  March 31, 2013 0305 03/31/13 0555  WBC 11.4* 11.3*  HGB 8.7* 8.8*  HCT 28.5* 28.8*  PLT 238 239   BMET  Recent Labs  2013-03-31 0305 03/31/13 0555  NA 155* 151*  K 3.8 3.7  CL 120* 116*  CO2 24 25  GLUCOSE 162* 136*  BUN 18 13  CREATININE 1.00 0.97  CALCIUM 8.4 8.5   PT/INR No results found for this basename: LABPROT, INR,  in the last 72 hours ABG No results found for this basename: PHART, PCO2, PO2, HCO3,  in the last 72 hours  Studies/Results: Dg Abd 2 Views  Mar 31, 2013   CLINICAL DATA:  Colonic distention.  Multiple abdominal surgeries.  EXAM: ABDOMEN - 2 VIEW  COMPARISON:  DG ABD 2 VIEWS dated 03/29/2013; CT ABD/PELVIS W CM dated 03/28/2013  FINDINGS: Trace blunting of the right costophrenic angle. No free intraperitoneal gas.  Dilated loops of left upper quadrant small bowel measure up to 4 cm, similar to the prior exam. There are several air-fluid levels within these small bowel loops. Reduced colonic gas compared to the prior exam. IVC filter noted.  IMPRESSION: 1. Mildly dilated loops of left upper quadrant small bowel with scattered internal air-fluid levels, similar to the prior exam, with abnormal but nonspecific  pattern which could be due to could ileus or partial small bowel obstruction. Correlate with bowel sounds and bowel function.   Electronically Signed   By: Herbie Baltimore M.D.   On: March 31, 2013 10:43   Dg Abd 2 Views  03/29/2013   CLINICAL DATA:  Lower abdominal pain. Nausea. Shortness of breath. Weakness.  EXAM: ABDOMEN - 2 VIEW  COMPARISON:  Abdominal radiograph 03/28/2013.  FINDINGS: Some colonic gas is noted. However, there are multiple borderline dilated of mildly dilated loops of gas-filled small bowel measuring up to 4.2 cm in diameter throughout the central abdomen and left side of the abdomen. No pneumoperitoneum appreciated on the left lateral decubitus view. Several small air-fluid levels are noted. Residual iodinated contrast material is noted within the lumen of the urinary bladder related to yesterday's CT examination. Numerous surgical clips are noted in the right upper quadrant of the abdomen. IVC filter projecting over either the right side at L2-L3.  IMPRESSION: 1. Nonspecific bowel gas pattern, as above, suggestive of partial small bowel obstruction. 2. No pneumoperitoneum.   Electronically Signed   By: Trudie Reed M.D.   On: 03/29/2013 13:38    Anti-infectives: Anti-infectives   Start     Dose/Rate Route Frequency Ordered Stop   03/29/13 1000  vancomycin (VANCOCIN) 1,500 mg in sodium chloride 0.9 % 500 mL IVPB  Status:  Discontinued     1,500 mg 250  mL/hr over 120 Minutes Intravenous Daily 03/29/13 0245 03/29/13 1117   03/29/13 0600  piperacillin-tazobactam (ZOSYN) IVPB 3.375 g  Status:  Discontinued     3.375 g 12.5 mL/hr over 240 Minutes Intravenous 3 times per day 03/29/13 0301 03/29/13 1117   03/29/13 0245  piperacillin-tazobactam (ZOSYN) injection 3.375 g  Status:  Discontinued     3.375 g Intramuscular Every 6 hours 03/29/13 0245 03/29/13 0300      Assessment/Plan: Patient Active Problem List   Diagnosis Date Noted  . Ileus 03/29/2013  . Abdominal pain  03/29/2013  . Hypernatremia 03/29/2013  . Feeding difficulties and mismanagement 12/16/2012  . Acute and chronic respiratory failure 12/15/2012  . Tracheostomy status 12/15/2012  . CAD (coronary artery disease) 10/31/2012  . Dyslipidemia, goal LDL below 70 10/31/2012  . Other and unspecified angina pectoris   . Chest pain, unspecified   . Obstructive sleep apnea 04/10/2012  . Essential hypertension 04/10/2012  . Atrial fibrillation   s/p multiple abdomina procedures at other institutions.  Ileus resolving.  Moving bowels.  Films nonspecific.  Exam is benign. No role at this point for surgery.   LOS: 3 days    Mallie Linnemann A. 03/31/2013

## 2013-03-31 NOTE — Progress Notes (Addendum)
Patient h.r. Mainly in the 130's at. Fib. Lopressor 5mg  i.v. Given. H.r. Comes down for a short period of time then back up also right arm is very swollen . Elevated up on pillow.

## 2013-03-31 NOTE — Care Management Note (Signed)
Page 1 of 2   04/22/2013     9:54:55 AM   CARE MANAGEMENT NOTE 04/22/2013  Patient:  Tim Short, Tim Short   Account Number:  [id number]  Date Initiated:  03/31/2013  Documentation initiated by:  AMERSON,JULIE  Subjective/Objective Assessment:   PT ADM ON 3/16 FROM [facility] WITH ILEUS, AFIB.  S/P MULTIPLE ABD SURGERIES AT OTHER FACILITIES; HAS OLD TRACH.     Action/Plan:   WILL FOLLOW FOR DC PLANNING AS PT PROGRESSES. NO PLAN FOR SURGERY, PER [facility], AT THIS POINT.   Anticipated DC Date:  04/15/2013   Anticipated DC Plan:  LONG TERM ACUTE CARE (LTAC)  In-house referral  Clinical Social Worker      DC Planning Services  CM consult      Choice offered to / List presented to:             Status of service:  Completed, signed off Medicare Important Message given?   (If response is "NO", the following Medicare IM given date fields will be blank) Date Medicare IM given:   Date Additional Medicare IM given:    Discharge Disposition:    Per UR Regulation:  Reviewed for med. necessity/level of care/duration of stay  If discussed at Long Length of Stay Meetings, dates discussed:   04/05/2013  04/07/2013  04/12/2013  04/14/2013    Comments:  Bary Richard  PHONE:  [phone] [email or web address]  4/10 0909 debbie Darchelle Nunes rn,bsn trying trach collar today. surg saw pt and not going to place peg at present. pt on tna. will cont to follow. await our md at hosp to call ins md to see if we can get approval for [email or web address] radiol to place peg today.  4/9 1100 debbie Asiana Benninger rn,bsn have left sticky note and have text dr Vanessa Barbara. have spoken w dr Molli Knock. [facility] ins would like peer to peer review at [phone] before they will consider ltac. will need one of md's at [facility] to call ins company and talk w their phy to assist getting pt approved for ltac.  04/20/13 JULIE AMERSON,RN,BSN [phone] RECEIVED CALL FROM MARY ANN AT [employer] REGARDING APPEAL INFO FOR LTAC.  MEDICAL DIRECTOR TO CALL DR L.  O'BANNON AT [phone] FOR APPEAL.  MED DIRECTOR PROVIDED WITH APPEAL INFORMATION.  04/20/13 JULIE AMERSON,RN,BSN [id number] PT TRANSFERRED TO 2H THIS AM; BACK ON VENT.  LEFT MESSAGE FOR DAWN AT [employer] [employer] [phone]) FOR APPEALS PROCESS INFO. UPDATED 2H CASE MGR.  04/19/13 JULIE AMERSON,RN,BSN [phone] PLANNING TO APPEAL DENIAL OF LTAC ADMISSION; MEDICAL DIRECTOR TO CALL INSURANCE CO.  UPDATED SON ON EVENTS; HE STATES HE PLANS TO CALL INSURANCE CO ALSO.  04/18/13 JULIE AMERSON,RN,BSN [phone] HEARD BACK FROM [facility] LATE TODAY; PAYOR HAS DENIED LTAC STAY.  04-15-13 2:10pm Avie Arenas, RNBSN [phone] Ltach referral placed.  [facility] has not heard back on ltach benefits. Physician states can go to Ltach today but not ready to go to SNF today.  04/14/13 JULIE AMERSON,RN,BSN [phone] SPOKE WITH PT'S SON RE: LTAC... PER [facility] LIASION, LTAC NOT COVERED BY INSURANCE--SON STATES PT WAS A PT AT [facility] [place] FROM 12/14/12 TO 02/01/13, AND HAD THE EXACT SAME INSURANCE HE HAS NOW.      WILL HAVE [facility] INVESTIGATE THIS,  AS SON STATES PT'S INSURANCE COVERED COST OF LTAC IN FULL.  SON STATES POLICY IS WITH [employer] [place]. NOTIFIED [facility] LIASION, [facility] WILL HAVE INSURANCE AUTH DEPT INVESTIGATE.  04/11/13 JULIE AMERSON,RN,BSN [phone] FAXED UPDATED CLINICALS TO [name] AT [facility]; [phone], EXT [phone]; FAX # [phone].  LIKELY DC BACK TO [name] 1-2 DAYS.  PER [name], DAILY BMPS WITH CLOSE MONITORING OF NA+ NOT AN ISSUE FOR FACILITY.  SHE WILL FOLLOW UP WITH ME ON BED AVAILABILITY AND INSURANCE AUTHORIZATION, NEEDED PRIOR TO ADMISSION.  04/07/13 JULIE AMERSON,RN,BSN [phone] SPOKE WITH PT'S SON, DAVID B.Brannan, (CELL [phone]), AT BEDSIDE, TO DISCUSS DC PLANNING.  MD PLANS TO RESTART TNA TODAY, AS PRIOR TO ADMISSION, DUE TO DECREASED PO INTAKE. SON STATES PT WAS RECEIVING TNA AT [name], THOUGH HE HAS BEEN A PT AT [facility]LY RECENTLY.  PT MAY BENEFIT FROM LTAC LOC PRIOR TO  RETURNING TO SNF, AND SON AGREEABLE WITH THIS; WILL HAVE [facility] AD[facility]IONS LIASION REVIEW CHART FOR ELIGIBILITY.  WILL FOLLOW UP WITH SON ON UPDATES TO DC PLANS.  04/05/13 JULIE AMERSON,RN,BSN [phone] [facility] NOT IN NETWORK WITH PT'S INSURANCE, AND IS NOT A POSSIBLITY AT DC, PER [facility] LIASION.  [facility] IS IN NETWORK, BUT CURRENTLY PT DOES NOT MEET ELIGIBILITY FOR LTAC.  WILL FOLLOW PROGRESS.  03/31/13 JULIE AMERSON,RN,BSN [phone] LEFT MESSAGE WITH [facility] FOR [name] TO GIVE CLINICAL UPDATE ([phone], EXT [phone]).

## 2013-04-01 ENCOUNTER — Encounter (HOSPITAL_COMMUNITY): Payer: Self-pay | Admitting: Nurse Practitioner

## 2013-04-01 ENCOUNTER — Inpatient Hospital Stay (HOSPITAL_COMMUNITY): Payer: Medicare HMO

## 2013-04-01 LAB — BASIC METABOLIC PANEL
BUN: 12 mg/dL (ref 6–23)
CHLORIDE: 111 meq/L (ref 96–112)
CO2: 25 mEq/L (ref 19–32)
CREATININE: 0.88 mg/dL (ref 0.50–1.35)
Calcium: 8.3 mg/dL — ABNORMAL LOW (ref 8.4–10.5)
GFR calc non Af Amer: 81 mL/min — ABNORMAL LOW (ref >90)
GLUCOSE: 126 mg/dL — AB (ref 70–99)
Potassium: 4.9 mEq/L (ref 3.7–5.3)
Sodium: 148 mEq/L — ABNORMAL HIGH (ref 137–147)

## 2013-04-01 LAB — GLUCOSE, CAPILLARY
GLUCOSE-CAPILLARY: 119 mg/dL — AB (ref 70–99)
Glucose-Capillary: 126 mg/dL — ABNORMAL HIGH (ref 70–99)
Glucose-Capillary: 164 mg/dL — ABNORMAL HIGH (ref 70–99)
Glucose-Capillary: 112 mg/dL — ABNORMAL HIGH (ref 70–99)

## 2013-04-01 LAB — CBC
HEMATOCRIT: 34.3 % — AB (ref 39.0–52.0)
Hemoglobin: 10.4 g/dL — ABNORMAL LOW (ref 13.0–17.0)
MCH: 29.3 pg (ref 26.0–34.0)
MCHC: 30.3 g/dL (ref 30.0–36.0)
MCV: 96.6 fL (ref 78.0–100.0)
Platelets: 160 10*3/uL (ref 150–400)
RBC: 3.55 MIL/uL — ABNORMAL LOW (ref 4.22–5.81)
RDW: 20.8 % — AB (ref 11.5–15.5)
WBC: 15 10*3/uL — ABNORMAL HIGH (ref 4.0–10.5)

## 2013-04-01 MED ORDER — DEXTROSE 5 % IV SOLN
1.0000 g | Freq: Three times a day (TID) | INTRAVENOUS | Status: DC
  Administered 2013-04-01 – 2013-04-05 (×12): 1 g via INTRAVENOUS
  Filled 2013-04-01 (×14): qty 1

## 2013-04-01 MED ORDER — VANCOMYCIN HCL 10 G IV SOLR
1500.0000 mg | INTRAVENOUS | Status: DC
  Administered 2013-04-01 – 2013-04-04 (×4): 1500 mg via INTRAVENOUS
  Filled 2013-04-01 (×5): qty 1500

## 2013-04-01 MED ORDER — METOPROLOL TARTRATE 1 MG/ML IV SOLN
5.0000 mg | INTRAVENOUS | Status: DC | PRN
  Administered 2013-04-01 – 2013-04-03 (×3): 5 mg via INTRAVENOUS
  Filled 2013-04-01 (×3): qty 5

## 2013-04-01 MED ORDER — DIGOXIN 0.25 MG/ML IJ SOLN
0.1250 mg | Freq: Once | INTRAMUSCULAR | Status: AC
  Administered 2013-04-01: 0.125 mg via INTRAVENOUS
  Filled 2013-04-01: qty 0.5

## 2013-04-01 NOTE — Consult Note (Signed)
CARDIOLOGY CONSULT NOTE   Patient ID: Tim Short MRN: [id number], DOB/AGE: [birth year]   Admit date: 03/28/2013 Date of Consult: 04/01/2013  Primary Physician:  Duane Lope, MD Primary Cardiologist: Catalina Gravel, MD / EP - J. Allred, MD, Ubaldo Glassing, MD ([facility])  Pt. Profile  79 y/o male with persistent afib s/p hybrid ablation in 09/2012 complicated by volvulus and resp failure req colectomy and prolonged intubation/trach, who was admitted to [facility] on 3/16 with abd pain and SBO/ileus along with recurrent afib/flutter.  Problem List  Past Medical History  Diagnosis Date  . Hypertension   . Hypercholesteremia   . Asthma   . Meniere disease   . Persistent atrial fibrillation     a. s/p dccv's in 2014;  b. not felt to be AF RFCA canddiate due to LA dil;  c. s/p failed Maze @ [facility] in 09/2012;  d. chronic pradaxa.  . Obesity   . Biatrial enlargement     LA size 5.3cm  . Obstructive sleep apnea     AHI 108/hr now on CPAP at 12cm H2O  . H/O hiatal hernia   . GERD (gastroesophageal reflux disease)     "associated w/hiatal hernia" (Jun 05, 2012)  . Peptic ulcer 1950's  . Migraines     "haven't had one for about 15 years" (2012-06-05)  . Arthritis     "joints" (Jun 05, 2012)  . Chronic lower back pain   . Nephrolithiasis ~ 1960's    "passed on their own" (2012/06/05)  . History of pneumonia 2002; 2006    "spent 8 days in isolation; Norovirus" (2012/06/05)  . Other and unspecified angina pectoris   . RLS (restless legs syndrome)   . Coronary artery disease     a. 05/2012 Cath/PCI: LM nl, LAD nl, LCX 22m (3.0x15 Integrity BMS), PTCA of OM1 through stent struts (kissing balloon).  . Diabetes mellitus without complication     FAMILY STATES PATIENT IS NOT DIABETIC    Past Surgical History  Procedure Laterality Date  . Wrist fracture surgery  1992    "repaired w/left hip bone graft" (06/05/2012)  . Total knee arthroplasty  08/19/1999  . Colonoscopy w/ polypectomy  2006  . Knee arthroscopy with  meniscal repair Right 1974    "medial meniscus repaired" (2012-06-05)  . Cardioversion  10/02/2011    Procedure: CARDIOVERSION;  Surgeon: Corky Crafts, MD;  Location: [facility] ENDOSCOPY;  Service: Cardiovascular;  Laterality: N/A;  h/p in file drawer  . Cardioversion  11/07/2011    Procedure: CARDIOVERSION;  Surgeon: Corky Crafts, MD;  Location: [facility] ENDOSCOPY;  Service: Cardiovascular;  Laterality: N/A;  h/p from 10/22 in file drawer/dl  . Cardiac catheterization  05/26/2012  . Coronary angioplasty with stent placement  06-05-2012    "1" (06/05/12)  . Cataract extraction w/ intraocular lens  implant, bilateral  2009  . Hemorrhoid surgery  ~ 2003  . Hiatal hernia repair  1983  . Nissen fundoplication  1983  . Ablation of dysrhythmic focus  SEPT/OCT 2014    ATRIAL FIB  . Inguinal hernia repair Bilateral 2000 and 2005    "one done at a time" (06-05-12)  . Colon surgery  10/08/2012   . Tracheostomy  OCT 2014    Allergies  Allergies  Allergen Reactions  . Corticosteroids     Inhaled Corticosteroids--Hoarseness, dry mouth  . Furosemide Other (See Comments)    Ototoxicity    HPI   79 y/o male with the above complex problem list.  He was dx  with afib in 03/2012 and underwent DCCV in 09/2011 with initial success but then reversion to afib within 3 days.  He was then placed on amio and underwent repeat dccv in 10/2011 but reverted to afib w/in a week.  He remained on amio but also remained in afib.  He was seen by J. Allred, MD in 03/2012 for consideration of afib rfca/pvi, however given dilated LA, this was felt to be futile, and [facility].  He was seen @ [facility] by Dr. Lennox SoldersMouncy and in 09/2012 underwent a hybrid ablation. He required dccv on POD3.  Unfortunately, his post procedure course was complicated by the development of volvulus requiring emergent R hemicolectomy and later underwent urgent colon resection from the terminal ileum to  hepatic flexure.  He developed recurrent afib with rvr and resp failure req prolonged intubation and trach placement.  [facility] and subsequently to [facility] in late January 2015.  He was maintained on TNA and dev abd pain in Feb and was taken to [facility] on 2/23 and required cholecystectomy in the setting of secondary cholecystitis , as well as resection of part of the colon for fistula, and IVC filter placement.  He was d/c'd back to [facility] on 3/4 adn again placed on TPN.  Diet was advanced on 3/9 after he passed a swallow eval but then dev progressive abd distension.  [facility] 3/16 where CT showed early partial SBO and ileus.  He has been seen by surgery and admitted by IM.  Ileus has since been resolving and there is no role for surgery at this time.  He has been cleared to eat, though he says the food is miserable.  We have been asked to evaluate b/c pt has been tachycardic since admission.  Admission ECG shows afib/flutter @ 133.  Notably, ECG's while the pt was @ [facility] over the winter show RSR.  Pt says that he noted going out of rhythm recently while @ [facility].  He continues to feel palpitations but denies c/p or dyspnea.  He continues to experience abd discomfort - 5/10.    Inpatient Medications  . atorvastatin  40 mg Oral Daily  . ceFEPime (MAXIPIME) IV  1 g Intravenous Q8H  . chlorhexidine  15 mL Mouth/Throat BID  . dabigatran  150 mg Oral BID  . diltiazem  240 mg Oral Daily  . insulin aspart  0-9 Units Subcutaneous TID WC  . pantoprazole (PROTONIX) IV  40 mg Intravenous Q12H  . predniSONE  10 mg Oral Q breakfast  . roflumilast  500 mcg Oral Daily  . sodium chloride  3 mL Intravenous Q12H  . vancomycin  1,500 mg Intravenous Q24H   Family History Family History  Problem Relation Age of Onset  . Congestive Heart Failure    . COPD Father   . Heart failure Sister   . Heart failure Brother   . CVA Brother   . Heart attack Brother     . Heart disease Brother   . CVA Brother   . Heart disease Brother     Social History History   Social History  . Marital Status: Divorced    Spouse Name: N/A    Number of Children: N/A  . Years of Education: N/A   Occupational History  . Not on file.   Social History Main Topics  . Smoking status: Former Smoker --  25 years    Types: Pipe    Quit date: 10/15/1981  . Smokeless tobacco: Never Used  . Alcohol Use: No  . Drug Use: No  . Sexual Activity: Not Currently   Other Topics Concern  . Not on file   Social History Narrative   Lives in [place] alone.  Divorced.   Retired Oncologist in [place]    Review of Systems  General:  No chills, fever, night sweats or weight changes.  Cardiovascular:  No chest pain, dyspnea on exertion, edema, orthopnea, +++ palpitations, no paroxysmal nocturnal dyspnea. Dermatological: No rash, lesions/masses Respiratory: +++ productive cough, no dyspnea Urologic: No hematuria, dysuria Abdominal:   +++ abd pain.  No nausea, vomiting, diarrhea, bright red blood per rectum, melena, or hematemesis Neurologic:  No visual changes, wkns, changes in mental status. All other systems reviewed and are otherwise negative except as noted above.  Physical Exam  Blood pressure 101/74, pulse 123, temperature 98.3 F (36.8 C), temperature source Oral, resp. rate 18, height 5' 8.9" (1.75 m), weight 203 lb 14.8 oz (92.5 kg), SpO2 100.00%.  General: Pleasant, NAD Psych: flat affect. Neuro: Alert and oriented X 3. Moves all extremities spontaneously. HEENT: Normal  Neck: Supple without bruits or JVD. Trach in place with TC. Lungs:  Resp regular and unlabored, coughs frequently (wet). Scatt rhonchi throughout. Heart: RRR tachy, no s3, s4, or murmurs. Abdomen: Soft, diffusely tender, non-distended, hypoactive bs.  Extremities: No clubbing, cyanosis.  Trace bilat LE edema. DP/PT/Radials 1+ and equal bilaterally.  Labs  Lab Results  Component  Value Date   WBC 15.0* 04/01/2013   HGB 10.4* 04/01/2013   HCT 34.3* 04/01/2013   MCV 96.6 04/01/2013   PLT 160 04/01/2013    Recent Labs Lab 03/28/13 1831  04/01/13 0406  NA 153*  < > 148*  K 3.3*  < > 4.9  CL 116*  < > 111  CO2 25  < > 25  BUN 22  < > 12  CREATININE 1.09  < > 0.88  CALCIUM 8.9  < > 8.3*  PROT 5.6*  --   --   BILITOT 0.4  --   --   ALKPHOS 67  --   --   ALT 30  --   --   AST 26  --   --   GLUCOSE 221*  < > 126*  < > = values in this interval not displayed. Lab Results  Component Value Date   TRIG 245* 01/31/2013   Radiology/Studies  Ct Abdomen Pelvis W Contrast  03/28/2013   CLINICAL DATA:  Abdominal distention.  EXAM: CT ABDOMEN AND PELVIS WITH CONTRAST  TECHNIQUE: Multidetector CT imaging of the abdomen and pelvis was performed using the standard protocol following bolus administration of intravenous contrast.  CONTRAST:  OMNIPAQUE IOHEXOL 300 MG/ML  SOLN  COMPARISON:  DG ABD ACUTE W/CHEST dated 03/28/2013; CT ABD-PELV W/O CM dated 03/11/2013; CT ABD/PELVIS W CM dated 01/30/2013   IMPRESSION: 1. Findings consistent with persistent phlegmon in the liver. 2. Interim removal of right upper quadrant drainage catheter. 3. Colonic distention with air-fluid levels. These findings have improved from prior study suggesting improving adynamic ileus or diarrheal illness. Followup abdominal series suggested. 4. Slight progression of soft tissue fluid in the anterior abdomen in the region the patient's scarring. This could represent developing phlegmon/abscess. Small amount of air is noted within this fluid collection. 5. 13 mm low-density lesion left kidney, not definite simple cyst. As noted on prior  study nonenhanced and enhanced MRI of the kidneys suggested for further evaluation.   Electronically Signed   By: Maisie Fus  Register   On: 03/28/2013 23:29   Dg Chest Port 1 View  04/01/2013   CLINICAL DATA:  Leukocytosis  EXAM: PORTABLE CHEST - 1 VIEW   IMPRESSION: Increasing left  basilar infiltrate and effusion.   Electronically Signed   By: Alcide Clever M.D.   On: 04/01/2013 09:10   Dg Abd 2 Views  03/30/2013   CLINICAL DATA:  Colonic distention.  Multiple abdominal surgeries.  EXAM: ABDOMEN - 2 VIEW  IMPRESSION: 1. Mildly dilated loops of left upper quadrant small bowel with scattered internal air-fluid levels, similar to the prior exam, with abnormal but nonspecific pattern which could be due to could ileus or partial small bowel obstruction. Correlate with bowel sounds and bowel function.   Electronically Signed   By: Herbie Baltimore M.D.   On: 03/30/2013 10:43   ECG  Aflutter, 133, somewhat diff st dep  ASSESSMENT AND PLAN  See below.  Signed, Nicolasa Ducking, NP 04/01/2013, 3:50 PM Atrial flutter post COnvergent AFib ablation noted in the context of ileus He is currently anticoagulated  With hx of paroxysmal tachypalpitations, this could either represent episodic awareness of persistent abnormal rhtyhm either 2/2 rate or other things, or recurrent tachycardia  If the latter is true there would be no anticipated benefit from DCCV in the absence of antiarrhtymic drug therapy, ie amio or dofetilide   So for now would watch and we will follow

## 2013-04-01 NOTE — Progress Notes (Signed)
Patient sustaining HR 125-130's. Given metoprolol and rechecked. Patient continues to maintain HR in 125-130's. MD notified. Will continue to monitor. Call bell within reach.  Valinda HoarLexie Daltin Crist RN

## 2013-04-01 NOTE — Progress Notes (Signed)
Subjective: Sleeping.  Awakens easily.  No apparent distress.    Objective: Vital signs in last 24 hours: Temp:  [97 F (36.1 C)-98.3 F (36.8 C)] 98.3 F (36.8 C) (03/20 0403) Pulse Rate:  [110-131] 129 (03/20 0806) Resp:  [18-24] 20 (03/20 0806) BP: (96-109)/(60-78) 105/74 mmHg (03/20 0403) SpO2:  [90 %-100 %] 95 % (03/20 0806) FiO2 (%):  [60 %] 60 % (03/20 0806) Last BM Date: 04/01/13  Intake/Output from previous day: 03/19 0701 - 03/20 0700 In: 0  Out: 1700 [Urine:1350] Intake/Output this shift:    Incision/Wound:small opening at inferior portion noted no pus or succus draining fibrinous in appearance. Soft no rebound or guarding. Incision otherwise healed.    Tracheostomy in place  Some rhonchi.   Lab Results:   Recent Labs  03/31/13 0555 04/01/13 0406  WBC 11.3* 15.0*  HGB 8.8* 10.4*  HCT 28.8* 34.3*  PLT 239 160   BMET  Recent Labs  03/31/13 0555 04/01/13 0406  NA 151* 148*  K 3.7 4.9  CL 116* 111  CO2 25 25  GLUCOSE 136* 126*  BUN 13 12  CREATININE 0.97 0.88  CALCIUM 8.5 8.3*   PT/INR No results found for this basename: LABPROT, INR,  in the last 72 hours ABG No results found for this basename: PHART, PCO2, PO2, HCO3,  in the last 72 hours  Studies/Results: Dg Abd 2 Views  04-02-13   CLINICAL DATA:  Colonic distention.  Multiple abdominal surgeries.  EXAM: ABDOMEN - 2 VIEW  COMPARISON:  DG ABD 2 VIEWS dated 03/29/2013; CT ABD/PELVIS W CM dated 03/28/2013  FINDINGS: Trace blunting of the right costophrenic angle. No free intraperitoneal gas.  Dilated loops of left upper quadrant small bowel measure up to 4 cm, similar to the prior exam. There are several air-fluid levels within these small bowel loops. Reduced colonic gas compared to the prior exam. IVC filter noted.  IMPRESSION: 1. Mildly dilated loops of left upper quadrant small bowel with scattered internal air-fluid levels, similar to the prior exam, with abnormal but nonspecific pattern  which could be due to could ileus or partial small bowel obstruction. Correlate with bowel sounds and bowel function.   Electronically Signed   By: Herbie Baltimore M.D.   On: Apr 02, 2013 10:43    Anti-infectives: Anti-infectives   Start     Dose/Rate Route Frequency Ordered Stop   03/29/13 1000  vancomycin (VANCOCIN) 1,500 mg in sodium chloride 0.9 % 500 mL IVPB  Status:  Discontinued     1,500 mg 250 mL/hr over 120 Minutes Intravenous Daily 03/29/13 0245 03/29/13 1117   03/29/13 0600  piperacillin-tazobactam (ZOSYN) IVPB 3.375 g  Status:  Discontinued     3.375 g 12.5 mL/hr over 240 Minutes Intravenous 3 times per day 03/29/13 0301 03/29/13 1117   03/29/13 0245  piperacillin-tazobactam (ZOSYN) injection 3.375 g  Status:  Discontinued     3.375 g Intramuscular Every 6 hours 03/29/13 0245 03/29/13 0300      Assessment/Plan:  LOS: 4 days  /Plan:  Patient Active Problem List    Diagnosis  Date Noted   .  Ileus  03/29/2013   .  Abdominal pain  03/29/2013   .  Hypernatremia  03/29/2013   .  Feeding difficulties and mismanagement  12/16/2012   .  Acute and chronic respiratory failure  12/15/2012   .  Tracheostomy status  12/15/2012   .  CAD (coronary artery disease)  10/31/2012   .  Dyslipidemia, goal LDL below  70  10/31/2012   .  Other and unspecified angina pectoris    .  Chest pain, unspecified    .  Obstructive sleep apnea  04/10/2012   .  Essential hypertension  04/10/2012   .  Atrial fibrillation    s/p multiple abdomina procedures at other institutions.  Ileus resolving. Moving bowels. Films nonspecific. Exam is benign.  No role at this point for surgery.  Check CXR given bump in WBC OK to feed. Not sure why NPO at this point. ?swallow   Tim Short A. 04/01/2013

## 2013-04-01 NOTE — Progress Notes (Signed)
ANTIBIOTIC CONSULT NOTE - INITIAL  Pharmacy Consult for Vancomycin Indication: pneumonia  Allergies  Allergen Reactions  . Corticosteroids     Inhaled Corticosteroids--Hoarseness, dry mouth  . Furosemide Other (See Comments)    Ototoxicity     Patient Measurements: Height: 5' 8.9" (175 cm) Weight: 203 lb 14.8 oz (92.5 kg) IBW/kg (Calculated) : 70.47 Adjusted Body Weight:   Vital Signs: Temp: 98.3 F (36.8 C) (03/20 0403) Temp src: Oral (03/20 0403) BP: 105/74 mmHg (03/20 0403) Pulse Rate: 131 (03/20 1119) Intake/Output from previous day: 03/19 0701 - 03/20 0700 In: 0  Out: 1700 [Urine:1350] Intake/Output from this shift:    Labs:  Recent Labs  03/30/13 0305 03/31/13 0555 04/01/13 0406  WBC 11.4* 11.3* 15.0*  HGB 8.7* 8.8* 10.4*  PLT 238 239 160  CREATININE 1.00 0.97 0.88   Estimated Creatinine Clearance: 78.8 ml/min (by C-G formula based on Cr of 0.88). No results found for this basename: VANCOTROUGH, Leodis BinetVANCOPEAK, VANCORANDOM, GENTTROUGH, GENTPEAK, GENTRANDOM, TOBRATROUGH, TOBRAPEAK, TOBRARND, AMIKACINPEAK, AMIKACINTROU, AMIKACIN,  in the last 72 hours   Microbiology: Recent Results (from the past 720 hour(s))  MRSA PCR SCREENING     Status: None   Collection Time    03/29/13  3:00 AM      Result Value Ref Range Status   MRSA by PCR NEGATIVE  NEGATIVE Final   Comment:            The GeneXpert MRSA Assay (FDA     approved for NASAL specimens     only), is one component of a     comprehensive MRSA colonization     surveillance program. It is not     intended to diagnose MRSA     infection nor to guide or     monitor treatment for     MRSA infections.  URINE CULTURE     Status: None   Collection Time    03/29/13  6:50 AM      Result Value Ref Range Status   Specimen Description URINE, RANDOM   Final   Special Requests NONE   Final   Culture  Setup Time     Final   Value: 03/29/2013 12:43     Performed at [facility]   Colony Count      Final   Value: NO GROWTH     Performed at [facility]   Culture     Final   Value: NO GROWTH     Performed at [facility]   Report Status 03/30/2013 FINAL   Final    Medical History: Past Medical History  Diagnosis Date  . Hypertension   . Hypercholesteremia   . Asthma   . Meniere disease   . Persistent atrial fibrillation   . Obesity   . Biatrial enlargement     LA size 5.3cm  . Obstructive sleep apnea     AHI 108/hr now on CPAP at 12cm H2O  . Chest pain, unspecified     "just recently" (06/04/2012)  . H/O hiatal hernia   . Shortness of breath     "associated w/asthma & AF; mostly w/exertion" (06/04/2012)  . GERD (gastroesophageal reflux disease)     "associated w/hiatal hernia" (06/04/2012)  . Peptic ulcer 1950's  . Migraines     "haven't had one for about 15 years" (06/04/2012)  . Arthritis     "joints" (06/04/2012)  . Chronic lower back pain   . Kidney stones ~ 1960's    "  passed on their own" (06/04/2012)  . Pneumonia 2002; 2006    "spent 8 days in isolation; Norovirus" (06/04/2012)  . Other and unspecified angina pectoris   . RLS (restless legs syndrome)   . Coronary artery disease   . Diabetes mellitus without complication     FAMILY STATES PATIENT IS NOT DIABETIC    Medications:  Scheduled:  . atorvastatin  40 mg Oral Daily  . ceFEPime (MAXIPIME) IV  1 g Intravenous Q8H  . chlorhexidine  15 mL Mouth/Throat BID  . dabigatran  150 mg Oral BID  . diltiazem  240 mg Oral Daily  . insulin aspart  0-9 Units Subcutaneous TID WC  . pantoprazole (PROTONIX) IV  40 mg Intravenous Q12H  . predniSONE  10 mg Oral Q breakfast  . roflumilast  500 mcg Oral Daily  . sodium chloride  3 mL Intravenous Q12H   Assessment: 79yo man with pneumonia, to start Vancomycin.  He had previously been receiving Vancomycin 1500mg  IV q24 at [facility] with last dose on 3/16 at 6AM and a random level here which was essentially a trough, though a bit early,  on 3/17 = 20.9.     Goal of Therapy:  Vancomycin trough level 15-20 mcg/ml  Plan:  1-  Vancomycin 1500mg  IV q24 2-  Vancomycin trough at steady state 3-  Monitor renal fxn and f/u any culture data 4-  Cefepime dose is appropriate for renal fxn  Marisue Humble, PharmD Clinical Pharmacist [facility]

## 2013-04-01 NOTE — Evaluation (Signed)
Clinical/Bedside Swallow Evaluation Patient Details  Name: Tim Short MRN: [id number] Date of Birth: 11-03-1934  Today's Date: 04/01/2013 Time: 1400-1410 SLP Time Calculation (min): 10 min  Past Medical History:  Past Medical History  Diagnosis Date  . Hypertension   . Hypercholesteremia   . Asthma   . Meniere disease   . Persistent atrial fibrillation   . Obesity   . Biatrial enlargement     LA size 5.3cm  . Obstructive sleep apnea     AHI 108/hr now on CPAP at 12cm H2O  . Chest pain, unspecified     "just recently" (06/29/12)  . H/O hiatal hernia   . Shortness of breath     "associated w/asthma & AF; mostly w/exertion" (2012/06/29)  . GERD (gastroesophageal reflux disease)     "associated w/hiatal hernia" (2012/06/29)  . Peptic ulcer 1950's  . Migraines     "haven't had one for about 15 years" (06/29/12)  . Arthritis     "joints" (06/29/2012)  . Chronic lower back pain   . Kidney stones ~ 1960's    "passed on their own" (06-29-2012)  . Pneumonia 2002; 2006    "spent 8 days in isolation; Norovirus" (06/29/12)  . Other and unspecified angina pectoris   . RLS (restless legs syndrome)   . Coronary artery disease   . Diabetes mellitus without complication     FAMILY STATES PATIENT IS NOT DIABETIC   Past Surgical History:  Past Surgical History  Procedure Laterality Date  . Wrist fracture surgery  1992    "repaired w/left hip bone graft" (29-Jun-2012)  . Total knee arthroplasty  08/19/1999  . Colonoscopy w/ polypectomy  2006  . Knee arthroscopy with meniscal repair Right 1974    "medial meniscus repaired" (06-29-2012)  . Cardioversion  10/02/2011    Procedure: CARDIOVERSION;  Surgeon: Corky Crafts, MD;  Location: [facility] END[facility]COPY;  Service: Cardiovascular;  Laterality: N/A;  h/p in file drawer  . Cardioversion  11/07/2011    Procedure: CARDIOVERSION;  Surgeon: Corky Crafts, MD;  Location: [facility] ENDOSCOPY;  Service: Cardiovascular;  Laterality: N/A;  h/p from  10/22 in file drawer/dl  . Cardiac catheterization  05/26/2012  . Coronary angioplasty with stent placement  06-29-2012    "1" (06-29-12)  . Cataract extraction w/ intraocular lens  implant, bilateral  2009  . Hemorrhoid surgery  ~ 2003  . Hiatal hernia repair  1983  . Nissen fundoplication  1983  . Ablation of dysrhythmic focus  SEPT/OCT 2014    ATRIAL FIB  . Inguinal hernia repair Bilateral 2000 and 2005    "one done at a time" (Jun 29, 2012)  . Colon surgery  10/08/2012   . Tracheostomy  OCT 2014   HPI:  Pt is 79 yo male with fairly recent hospitalization at [facility] in September 2014 for failed maize procedure and during that hospitalization pt developed volvulus and has required right colectomy (10/08/2012), subsequently developed multiple abscesses and enteric fistulas managed percutaneous drains. Pt was in prolonged respiratory failure at [facility], unable to wean of the vent and requiring trach placement 10/29/2012. He was transferred to [facility] in December 2014 and later to [facility] in late January 2015. He was on TNA and was doing fairly well initially, but developed more abdominal pain and on 2/23 taken to OR in [place] for gallbladder removal (secondary to cholecystitis), resection for part of the colon for fistula, IVC filter placement. Sent back to [facility] on march 4th, 2015 and started on TPN. Diet advanced on  March 9th, 2015 after pt passed swallow evaluation. [facility], after noticing progressive abdominal distension. In ED, CT abdomen with air- fluid level and findings consistent with early partial SBO, ileus. Surgery reports pt is ok for PO from their standpoint, SLP ordered for swallow eval.    Assessment / Plan / Recommendation Clinical Impression  Limited bedside given report from [facility] of silent aspiration. Pt will need objective testing prior to initiating diet. He did demosntrate ability to swallow ice chips. Initially wet vocal quality and cough response, likely  from mobilization of secretions. Suspect pt will be able to initiate diet following FEES tomorrow given strong swallow response.     Aspiration Risk  Moderate    Diet Recommendation Ice chips PRN after oral care;NPO        Other  Recommendations Recommended Consults: FEES Oral Care Recommendations: Oral care Q4 per protocol   Follow Up Recommendations  Skilled Nursing facility    Frequency and Duration min 2x/week  2 weeks   Pertinent Vitals/Pain NA    SLP Swallow Goals     Swallow Study Prior Functional Status       General HPI: Pt is 79 yo male with fairly recent [facility] in September 2014 for failed maize procedure and during that hospitalization pt developed volvulus and has required right colectomy (10/08/2012), subsequently developed multiple abscesses and enteric fistulas managed percutaneous drains. Pt was in [facility], unable to wean of the vent and requiring trach placement 10/29/2012. He was transferred to [facility] in December 2014 and later to [facility] in late January 2015. He was on TNA and was doing fairly well initially, but developed more abdominal pain and on 2/23 taken to [place] in [place] for gallbladder removal (secondary to cholecystitis), resection for part of the colon for fistula, IVC filter placement. Sent back to [facility] on march 4th, 2015 and started on TPN. Diet advanced on March 9th, 2015 after pt passed swallow evaluation. [facility], after noticing progressive abdominal distension. In ED, CT abdomen with air- fluid level and findings consistent with early partial SBO, ileus. Surgery reports pt is ok for PO from their standpoint, SLP ordered for swallow eval.  Type of Study: Bedside swallow evaluation Previous Swallow Assessment: [facility] hospital - 3/16 pt "passed" FEES and was started on diet, began to decline and blue dye test was given, pt silently aspirated thin, nectar, honey per report.  Diet  Prior to this Study: NPO Temperature Spikes Noted: No Respiratory Status: Trach collar Trach Size and Type: Cuff;Deflated;With PMSV in place;#6;Other (Comment) (Portex trach) History of Recent Intubation: No Behavior/Cognition: Alert;Cooperative;Pleasant mood Oral Cavity - Dentition: Adequate natural dentition Self-Feeding Abilities: Able to feed self Patient Positioning: Upright in bed Baseline Vocal Quality: Breathy;Low vocal intensity Volitional Cough: Strong Volitional Swallow: Able to elicit    Oral/Motor/Sensory Function Overall Oral Motor/Sensory Function: Appears within functional limits for tasks assessed   Ice Chips Ice chips: Impaired Presentation: Spoon Pharyngeal Phase Impairments: Wet Vocal Quality;Cough - Immediate (multiple swallow)   Thin Liquid Thin Liquid: Not tested    Nectar Thick Nectar Thick Liquid: Not tested   Honey Thick Honey Thick Liquid: Not tested   Puree Puree:  (pt refused)   Solid   GO    Solid: Not tested      Harlon DittyBonnie Marlin Jarrard, MA CCC-SLP [phone]  Quintasia Theroux, Riley NearingBonnie Caroline 04/01/2013,2:39 PM

## 2013-04-01 NOTE — Progress Notes (Signed)
Pt is sustaining a HR of 120s-130s unable to give PRN metoprolol for HR due to low BP; MD notified; MD is consulting with cardiology; will continue to monitor.  Park BreedBARNETT, Mateen Franssen M, RN

## 2013-04-01 NOTE — Evaluation (Signed)
[facility] Speaking Valve - Evaluation Patient Details  Name: Tim Short MRN: [id number] Date of Birth: 12/25/1934  Today's Date: 04/01/2013 Time: 1330-1420 SLP Time Calculation (min): 50 min  Past Medical History:  Past Medical History  Diagnosis Date  . Hypertension   . Hypercholesteremia   . Asthma   . Meniere disease   . Persistent atrial fibrillation   . Obesity   . Biatrial enlargement     LA size 5.3cm  . Obstructive sleep apnea     AHI 108/hr now on CPAP at 12cm H2O  . Chest pain, unspecified     "just recently" (06/04/2012)  . H/O hiatal hernia   . Shortness of breath     "associated w/asthma & AF; mostly w/exertion" (06/04/2012)  . GERD (gastroesophageal reflux disease)     "associated w/hiatal hernia" (06/04/2012)  . Peptic ulcer 1950's  . Migraines     "haven't had one for about 15 years" (06/04/2012)  . Arthritis     "joints" (06/04/2012)  . Chronic lower back pain   . Kidney stones ~ 1960's    "passed on their own" (06/04/2012)  . Pneumonia 2002; 2006    "spent 8 days in isolation; Norovirus" (06/04/2012)  . Other and unspecified angina pectoris   . RLS (restless legs syndrome)   . Coronary artery disease   . Diabetes mellitus without complication     FAMILY STATES PATIENT IS NOT DIABETIC   Past Surgical History:  Past Surgical History  Procedure Laterality Date  . Wrist fracture surgery  1992    "repaired w/left hip bone graft" (06/04/2012)  . Total knee arthroplasty  08/19/1999  . Colonoscopy w/ polypectomy  2006  . Knee arthroscopy with meniscal repair Right 1974    "medial meniscus repaired" (06/04/2012)  . Cardioversion  10/02/2011    Procedure: CARDIOVERSION;  Surgeon: Corky CraftsJayadeep S. Varanasi, MD;  [facility];  Service: Cardiovascular;  Laterality: N/A;  h/p in file drawer  . Cardioversion  11/07/2011    Procedure: CARDIOVERSION;  Surgeon: Corky CraftsJayadeep S. Varanasi, MD;  [facility];  Service: Cardiovascular;  Laterality: N/A;  h/p from  10/22 in file drawer/dl  . Cardiac catheterization  05/26/2012  . Coronary angioplasty with stent placement  06/04/2012    "1" (06/04/2012)  . Cataract extraction w/ intraocular lens  implant, bilateral  2009  . Hemorrhoid surgery  ~ 2003  . Hiatal hernia repair  1983  . Nissen fundoplication  1983  . Ablation of dysrhythmic focus  SEPT/OCT 2014    ATRIAL FIB  . Inguinal hernia repair Bilateral 2000 and 2005    "one done at a time" (06/04/2012)  . Colon surgery  10/08/2012   . Tracheostomy  OCT 2014   HPI:  Pt is 79 yo male with fairly recent [facility] in September 2014 for failed maize procedure and during that hospitalization pt developed volvulus and has required right colectomy (10/08/2012), subsequently developed multiple abscesses and enteric fistulas managed percutaneous drains. Pt was [facility], unable to wean of the vent and requiring trach placement 10/29/2012. He was transferred to [facility] in December 2014 and later to [facility] in late January 2015. He was on TNA and was doing fairly well initially, but developed more abdominal pain and on 2/23 taken to OR in [place] for gallbladder removal (secondary to cholecystitis), resection for part of the colon for fistula, IVC filter placement. Sent back to [facility] on march 4th, 2015 and started on TPN. Diet  advanced on March 9th, 2015 after pt passed swallow evaluation. He was brought to [facility] March 16th, after noticing progressive abdominal distension. In ED, CT abdomen with air- fluid level and findings consistent with early partial SBO, ileus. Surgery reports pt is ok for PO from their standpoint, SLP ordered for swallow eval.    Assessment / Plan / Recommendation Clinical Impression  Pt demonstrates adquate tolerance of cuff deflation and PMSV placement with immediate redirection of air to upper airway and positive phonation. No evidence of retention of CO2, Pts O2 stas remained adequate. Pt requested SLP  assit in calling his son, Pt was able to leave him a voicemail vebalizing requests and call back. Pt may wear PMSV full staff or family supervision, but valve should be removed when pt is alone. Discussed with RN with verbal instruction. Likely pt will toelrate PMSV during all waking hours when he is able to demosntrate placement and removal independently.     SLP Assessment  Patient needs continued Speech Lanaguage Pathology Services    Follow Up Recommendations  Skilled Nursing facility    Frequency and Duration min 2x/week  2 weeks   Pertinent Vitals/Pain NA    SLP Goals Potential to Achieve Goals: Good   PMSV Trial  PMSV was placed for: 10 minutes  Able to redirect subglottic air through upper airway: Yes Able to Attain Phonation: Yes Voice Quality: Breathy Able to Expectorate Secretions: Yes Level of Secretion Expectoration with PMSV: Oral Breath Support for Phonation: Adequate Intelligibility: Intelligibility reduced Word: 75-100% accurate Phrase: 75-100% accurate Sentence: 75-100% accurate Conversation: 75-100% accurate SpO2 During Trial: 95 % Behavior: Alert   Tracheostomy Tube  Additional Tracheostomy Tube Assessment Trach Collar Period: 24 hrs day Secretion Description: frothy, white Level of Secretion Expectoration: Tracheal;Oral    Vent Dependency  Vent Dependent: No FiO2 (%): 60 %    Cuff Deflation Trial Tolerated Cuff Deflation: Yes Length of Time for Cuff Deflation Trial: Behavior: Alert   Harlon Ditty, MA CCC-SLP [phone]  Claudine Mouton 04/01/2013, 2:32 PM

## 2013-04-01 NOTE — Progress Notes (Signed)
Patient ID: Tim Short, male   DOB: 24-Jun-1934, 79 y.o.   MRN: [id number]  [facility] PROGRESS NOTE  SEDRIC GUIA ONG:[id number] DOB: 04/12/1934 DOA: 03/28/2013 PCP:  Duane Lope, MD  Brief narrative:  Pt is 79 yo male with fairly recent hospitalization at [facility] in September 2014 for failed maize procedure and during that hospitalization pt developed volvulus and has required right colectomy (10/08/2012), subsequently developed multiple abscesses and enteric fistulas managed percutaneous drains. Pt was in prolonged respiratory failure at [facility], unable to wean of the vent and requiring trach placement 10/29/2012. He was transferred to [facility] in December 2014 and later to [facility] in late January 2015. He was on TNA and was doing fairly well initially, but developed more abdominal pain and on 2/23 taken to OR in [place] for gallbladder removal (secondary to cholecystitis), resection for part of the colon for fistula, IVC filter placement. Sent back to [facility] on march 4th, 2015 and started on TPN. Diet advanced on March 9th, 2015 after pt passed swallow evaluation. He was brought to [facility] March 16th, after noticing progressive abdominal distension. In ED, CT abdomen with air- fluid level and findings consistent with early partial SBO, ileus. Surgery consulted and [facility] asked to admit for further evaluation.   Principal Problem:  Abdominal pain  - secondary to ileus, partial SBO  - appreciate surgery input  - pt appears to be slightly better this AM, no abd distension and good bowel sounds  - continue supportive care with analgesia, antiemetics as needed for symptom control  - stop IVF as pt more congested on exam  - no indication for surgical intervention at this time  - keep NPO until SLP done  Active Problems:  Atrial fibrillation  - rate in 120's  - will continue to monitor on telemetry, continue Cardizem and Pradaxa  - ask cardiology for additional recommendations  Leukocytosis  - likely  secondary to ileus  - WBC up from yesterday, CXR with pleural effusion, ? Infiltrate ? HCAP - given increase in WBC - will cover broadly today as pt at risk for HCAP - Vancomycin and Maxipime  Pulmonary vascular congestion - bilateral rhonchi and trace bilateral pitting edema - stop IVF, hold Lasix due to low BP and pt has allergy to it - monitor I's/O's, daily weights Anemia of chronic disease  - Hg stable over the past 24 hours  - no signs of active bleeding  - repeat CBC in AM  Essential hypertension  - slightly on soft side this AM  - close monitoring  Tracheostomy status  - suctioning as needed  Hypernatremia  - secondary to pre renal etiology, Na trending down slowly  - continue IVF, D5 at 50 cc/hr  Moderate malnutrition  - secondary to acute on chronic illness as outlined above  - keep NPO for now  - SLP pending  Hypokalemia  - supplemented and K is WNL this AM  - repeat BMP in AM   Consultants:  Surgery  Cardiology  Procedures/Studies:  Ct Abdomen Pelvis W Contrast 03/28/2013 Findings consistent with persistent phlegmon in the liver. Interim removal of right upper quadrant drainage catheter. Colonic distention with air-fluid levels. Slight progression of soft tissue fluid in the anterior abdomen in the region the patient's scarring. 13 mm low-density lesion left kidney, not definite simple cyst.  Dg Abd 2 Views 03/30/2013 Mildly dilated loops of left upper quadrant small bowel with scattered internal air-fluid levels, similar to the prior exam, with abnormal but nonspecific  pattern which could be due to could ileus or partial SBO.  Dg Abd 2 View 03/29/2013 Nonspecific bowel gas pattern, as above, suggestive of partial small bowel obstruction. No pneumoperitoneum.  Dg Abd Acute W/chest 03/28/2013 Persistent small bowel dilatation compatible with either partial SBO or ileus. No change in aeration to the lungs compared with previous exam. Antibiotics:  Vancomycin 3/20  --> Maxipime 3/20 -->  Code Status: Full  Family Communication: Son at bedside  Disposition Plan: Remains inpatient   HPI/Subjective: No events overnight.   Objective: Filed Vitals:   04/01/13 0037 04/01/13 0403 04/01/13 0410 04/01/13 0806  BP:  105/74    Pulse: 130 122 127 129  Temp:  98.3 F (36.8 C)    TempSrc:  Oral    Resp: 24 18 20 20   Height:      Weight:      SpO2: 93% 98% 97% 95%    Intake/Output Summary (Last 24 hours) at 04/01/13 0840 Last data filed at 04/01/13 0405  Gross per 24 hour  Intake      0 ml  Output   1700 ml  Net  -1700 ml    Exam:   General:  Pt is alert, follows commands appropriately, not in acute distress, on trach   Cardiovascular: Regular hythm, tachycardic, S1/S2, no murmurs, no rubs, no gallops  Respiratory: Bilateral rhonchi, diminished air movement at bases   Abdomen: Soft, non tender, non distended, bowel sounds present, no guarding  Extremities: trace bilateral LE pitting edema, pulses DP and PT palpable bilaterally  Neuro: Grossly nonfocal  Data Reviewed: Basic Metabolic Panel:  Recent Labs Lab 03/28/13 1831 03/29/13 0446 03/30/13 0305 03/31/13 0555 04/01/13 0406  NA 153* 157* 155* 151* 148*  K 3.3* 3.3* 3.8 3.7 4.9  CL 116* 121* 120* 116* 111  CO2 25 23 24 25 25   GLUCOSE 221* 154* 162* 136* 126*  BUN 22 20 18 13 12   CREATININE 1.09 1.05 1.00 0.97 0.88  CALCIUM 8.9 8.4 8.4 8.5 8.3*  MG  --  2.2  --   --   --    Liver Function Tests:  Recent Labs Lab 03/28/13 1831  AST 26  ALT 30  ALKPHOS 67  BILITOT 0.4  PROT 5.6*  ALBUMIN 2.3*    Recent Labs Lab 03/28/13 1831  LIPASE 30   CBC:  Recent Labs Lab 03/28/13 1831 03/30/13 0305 03/31/13 0555 04/01/13 0406  WBC 15.8* 11.4* 11.3* 15.0*  NEUTROABS 13.5*  --   --   --   HGB 9.3* 8.7* 8.8* 10.4*  HCT 30.1* 28.5* 28.8* 34.3*  MCV 95.3 96.6 96.3 96.6  PLT 265 238 239 160   CBG:  Recent Labs Lab 03/30/13 2051 03/31/13 0615 03/31/13 1130  03/31/13 1718 04/01/13 0612  GLUCAP 145* 128* 118* 144* 126*    Recent Results (from the past 240 hour(s))  MRSA PCR SCREENING     Status: None   Collection Time    03/29/13  3:00 AM      Result Value Ref Range Status   MRSA by PCR NEGATIVE  NEGATIVE Final   Comment:            The GeneXpert MRSA Assay (FDA     approved for NASAL specimens     only), is one component of a     comprehensive MRSA colonization     surveillance program. It is not     intended to diagnose MRSA     infection nor to  guide or     monitor treatment for     MRSA infections.  URINE CULTURE     Status: None   Collection Time    03/29/13  6:50 AM      Result Value Ref Range Status   Specimen Description URINE, RANDOM   Final   Special Requests NONE   Final   Culture  Setup Time     Final   Value: 03/29/2013 12:43     Performed at [facility] Count     Final   Value: NO GROWTH     Performed at [facility]   Culture     Final   Value: NO GROWTH     Performed at [facility]   Report Status 03/30/2013 FINAL   Final     Scheduled Meds: . atorvastatin  40 mg Oral Daily  . chlorhexidine  15 mL Mouth/Throat BID  . dabigatran  150 mg Oral BID  . diltiazem  240 mg Oral Daily  . insulin aspart  0-9 Units Subcutaneous TID WC  . pantoprazole (PROTONIX) IV  40 mg Intravenous Q12H  . predniSONE  10 mg Oral Q breakfast  . roflumilast  500 mcg Oral Daily  . sodium chloride  3 mL Intravenous Q12H   Continuous Infusions: . dextrose 5 % 1,000 mL with potassium chloride 40 mEq infusion 50 mL/hr at 04/01/13 0529     Debbora Presto, MD  [facility] Pager [phone]  If 7PM-7AM, please contact night-coverage [email or web address] Password [email or web address] 04/01/2013, 8:40 AM   LOS: 4 days

## 2013-04-02 ENCOUNTER — Inpatient Hospital Stay (HOSPITAL_COMMUNITY): Payer: Medicare HMO

## 2013-04-02 ENCOUNTER — Other Ambulatory Visit (HOSPITAL_COMMUNITY): Payer: Medicare HMO

## 2013-04-02 LAB — BASIC METABOLIC PANEL
BUN: 12 mg/dL (ref 6–23)
CALCIUM: 8.3 mg/dL — AB (ref 8.4–10.5)
CHLORIDE: 111 meq/L (ref 96–112)
CO2: 27 mEq/L (ref 19–32)
CREATININE: 0.93 mg/dL (ref 0.50–1.35)
GFR calc non Af Amer: 79 mL/min — ABNORMAL LOW (ref >90)
Glucose, Bld: 116 mg/dL — ABNORMAL HIGH (ref 70–99)
Potassium: 3.2 mEq/L — ABNORMAL LOW (ref 3.7–5.3)
Sodium: 149 mEq/L — ABNORMAL HIGH (ref 137–147)

## 2013-04-02 LAB — GLUCOSE, CAPILLARY
GLUCOSE-CAPILLARY: 106 mg/dL — AB (ref 70–99)
GLUCOSE-CAPILLARY: 123 mg/dL — AB (ref 70–99)
GLUCOSE-CAPILLARY: 121 mg/dL — AB (ref 70–99)
Glucose-Capillary: 85 mg/dL (ref 70–99)

## 2013-04-02 LAB — CBC
HCT: 30.2 % — ABNORMAL LOW (ref 39.0–52.0)
Hemoglobin: 9.3 g/dL — ABNORMAL LOW (ref 13.0–17.0)
MCH: 29.7 pg (ref 26.0–34.0)
MCHC: 30.8 g/dL (ref 30.0–36.0)
MCV: 96.5 fL (ref 78.0–100.0)
PLATELETS: 205 10*3/uL (ref 150–400)
RBC: 3.13 MIL/uL — ABNORMAL LOW (ref 4.22–5.81)
RDW: 20.8 % — AB (ref 11.5–15.5)
WBC: 14.2 10*3/uL — ABNORMAL HIGH (ref 4.0–10.5)

## 2013-04-02 MED ORDER — POTASSIUM CHLORIDE 10 MEQ/100ML IV SOLN
10.0000 meq | INTRAVENOUS | Status: AC
  Administered 2013-04-02 (×3): 10 meq via INTRAVENOUS
  Filled 2013-04-02 (×3): qty 100

## 2013-04-02 MED ORDER — MORPHINE SULFATE 2 MG/ML IJ SOLN
2.0000 mg | INTRAMUSCULAR | Status: DC | PRN
  Administered 2013-04-02 – 2013-05-04 (×41): 2 mg via INTRAVENOUS
  Filled 2013-04-02 (×41): qty 1

## 2013-04-02 MED ORDER — SODIUM CHLORIDE 0.9 % IJ SOLN
10.0000 mL | INTRAMUSCULAR | Status: DC | PRN
  Administered 2013-04-02: 30 mL
  Administered 2013-04-04 – 2013-04-10 (×2): 10 mL

## 2013-04-02 MED ORDER — LEVALBUTEROL HCL 0.63 MG/3ML IN NEBU
0.6300 mg | INHALATION_SOLUTION | Freq: Four times a day (QID) | RESPIRATORY_TRACT | Status: DC | PRN
  Filled 2013-04-02: qty 3

## 2013-04-02 MED ORDER — PREDNISONE 5 MG PO TABS
5.0000 mg | ORAL_TABLET | Freq: Every day | ORAL | Status: DC
  Administered 2013-04-03 – 2013-04-19 (×17): 5 mg via ORAL
  Filled 2013-04-02 (×21): qty 1

## 2013-04-02 NOTE — Procedures (Addendum)
Objective Swallowing Evaluation: Modified Barium Swallowing Study  Patient Details  Name: Tim Short MRN: [id number] Date of Birth: 1934/04/05  Today's Date: 04/02/2013 Time: 1225-1250 SLP Time Calculation (min): 25 min  Past Medical History:  Past Medical History  Diagnosis Date  . Hypertension   . Hypercholesteremia   . Asthma   . Meniere disease   . Persistent atrial fibrillation     a. s/p multiple dccv's;  b. failed amio;  c. not felt to be AF RFCA canddiate due to LA dil;  d. s/p failed hybrid ablation @ [facility] in 09/2012;  e. chronic pradaxa.  . Obesity   . Biatrial enlargement     LA size 5.3cm  . Obstructive sleep apnea     AHI 108/hr now on CPAP at 12cm H2O  . H/O hiatal hernia   . GERD (gastroesophageal reflux disease)     "associated w/hiatal hernia" (20-Jun-2012)  . Peptic ulcer 1950's  . Migraines     "haven't had one for about 15 years" (Jun 20, 2012)  . Arthritis     "joints" (06-20-12)  . Chronic lower back pain   . Nephrolithiasis ~ 1960's    "passed on their own" (06-20-12)  . History of pneumonia 2002; 2006    "spent 8 days in isolation; Norovirus" (20-Jun-2012)  . Other and unspecified angina pectoris   . RLS (restless legs syndrome)   . Coronary artery disease     a. 05/2012 Cath/PCI: LM nl, LAD nl, LCX 12m (3.0x15 Integrity BMS), PTCA of OM1 through stent struts (kissing balloon).  . Diabetes mellitus without complication     FAMILY STATES PATIENT IS NOT DIABETIC   Past Surgical History:  Past Surgical History  Procedure Laterality Date  . Wrist fracture surgery  1992    "repaired w/left hip bone graft" (06-20-2012)  . Total knee arthroplasty  08/19/1999  . Colonoscopy w/ polypectomy  2006  . Knee arthroscopy with meniscal repair Right 1974    "medial meniscus repaired" (2012/06/20)  . Cardioversion  10/02/2011    Procedure: CARDIOVERSION;  Surgeon: Corky Crafts, MD;  Location: [facility] ENDOSCOPY;  Service: Cardiovascular;  Laterality: N/A;  h/p in file  drawer  . Cardioversion  11/07/2011    Procedure: CARDIOVERSION;  Surgeon: Corky Crafts, MD;  Location: [facility] ENDOSCOPY;  Service: Cardiovascular;  Laterality: N/A;  h/p from 10/22 in file drawer/dl  . Cardiac catheterization  05/26/2012  . Coronary angioplasty with stent placement  06-20-2012    "1" (2012-06-20)  . Cataract extraction w/ intraocular lens  implant, bilateral  2009  . Hemorrhoid surgery  ~ 2003  . Hiatal hernia repair  1983  . Nissen fundoplication  1983  . Ablation of dysrhythmic focus  SEPT/OCT 2014    ATRIAL FIB  . Inguinal hernia repair Bilateral 2000 and 2005    "one done at a time" (2012/06/20)  . Colon surgery  10/08/2012   . Tracheostomy  OCT 2014   HPI:  Pt is 79 yo male with fairly recent hospitalization at [facility] in September 2014 for failed maize procedure and during that hospitalization pt developed volvulus and has required right colectomy (10/08/2012), subsequently developed multiple abscesses and enteric fistulas managed percutaneous drains. Pt was in prolonged respiratory failure at [facility], unable to wean of the vent and requiring trach placement 10/29/2012. He was transferred to [facility] in December 2014 and later to [facility] in late January 2015. He was on TNA and was doing fairly well initially, but developed more abdominal pain and  on 2/23 taken to OR in [place] for gallbladder removal (secondary to cholecystitis), resection for part of the colon for fistula, IVC filter placement. Sent back to [facility] on march 4th, 2015 and started on TPN. Diet advanced on March 9th, 2015 after pt passed swallow evaluation. [facility], after noticing progressive abdominal distension. In ED, CT abdomen with air- fluid level and findings consistent with early partial SBO, ileus. Surgery reports pt is ok for PO from their standpoint, SLP ordered for swallow eval.      Assessment / Plan / Recommendation Clinical Impression  Dysphagia Diagnosis: Moderate pharyngeal  phase dysphagia;Severe pharyngeal phase dysphagia Clinical impression: MBS completed with pt's PMSV donned and appears to be congruent with study performed at [facility].  He demonstrated moderate-severe motor based pharyngeal dyspahgia as evidenced by reduced tongue base retraction, decreased laryngeal elevation and decreased pharyngeal contraction.  Impairments led to mild pharyngeal residue in vallecule/pyriform sinuses and posterior pharyngeal wall and laryngeal vestibule consistently silent penetration to vocal cords.  Verbal cues for hard cough briefly cleared vestibule with subsequent penetration from residue.  SLP recommends Dys 1 (puree) only, no liquids (pudding thick liquid) with speaking valve donned, two swallows, volitional coughs, pills crushed in applesauce and full supervision/assist.       Treatment Recommendation  Therapy as outlined in treatment plan below    Diet Recommendation Dysphagia 1 (Puree);Pudding-thick liquid   Medication Administration: Crushed with puree Supervision: Patient able to self feed;Full supervision/cueing for compensatory strategies Compensations: Slow rate;Small sips/bites;Multiple dry swallows after each bite/sip;Hard cough after swallow Postural Changes and/or Swallow Maneuvers: Seated upright 90 degrees;Upright 30-60 min after meal, wear speaking valve during all po's    Other  Recommendations Oral Care Recommendations: Oral care BID Other Recommendations: Order thickener from pharmacy   Follow Up Recommendations  Skilled Nursing facility    Frequency and Duration min 2x/week  2 weeks   Pertinent Vitals/Pain WDL            Reason for Referral Objectively evaluate swallowing function   Oral Phase Oral Preparation/Oral Phase Oral Phase: WFL (WFL for textures assessed)   Pharyngeal Phase Pharyngeal Phase Pharyngeal Phase: Impaired Pharyngeal - Honey Pharyngeal - Honey Teaspoon: Reduced pharyngeal peristalsis;Pharyngeal residue -  valleculae;Reduced tongue base retraction;Penetration/Aspiration during swallow;Pharyngeal residue - pyriform sinuses;Reduced laryngeal elevation Penetration/Aspiration details (honey teaspoon): Material enters airway, CONTACTS cords and not ejected out Pharyngeal - Honey Cup: Penetration/Aspiration during swallow;Pharyngeal residue - valleculae;Pharyngeal residue - pyriform sinuses;Reduced tongue base retraction;Reduced laryngeal elevation Penetration/Aspiration details (honey cup): Material enters airway, remains ABOVE vocal cords then ejected out Pharyngeal - Solids Pharyngeal - Puree: Pharyngeal residue - valleculae;Reduced tongue base retraction;Pharyngeal residue - pyriform sinuses;Reduced laryngeal elevation  Cervical Esophageal Phase        Cervical Esophageal Phase Cervical Esophageal Phase: [facility] Inter[facility] ServicesWFL         Darrow BussingLisa Willis Katelynne Revak M.Ed [employer]-SLP Pager [phone]  04/02/2013

## 2013-04-02 NOTE — Progress Notes (Signed)
Please note that pt unable to get Ct abd with contrast as he has received barium contrast for SLP evaluation. Will attempt in AM.  Debbora PrestoMAGICK-Deah Ottaway, MD  [facility] Pager [phone]  If 7PM-7AM, please contact night-coverage [email or web address] Password [email or web address]

## 2013-04-02 NOTE — Progress Notes (Signed)
Patient ID: Tim Short, male   DOB: 10-22-1934, 79 y.o.   MRN: [id number]  [facility] PROGRESS NOTE  ADIT RIDDLES NWG:[id number] DOB: [birth year] DOA: 03/28/2013 PCP:  Duane Lope, MD  Brief narrative:  Pt is 79 yo male with fairly recent hospitalization at [facility] in September 2014 for failed maize procedure and during that hospitalization pt developed volvulus and has required right colectomy (10/08/2012), subsequently developed multiple abscesses and enteric fistulas managed percutaneous drains. Pt was in prolonged respiratory failure at [facility], unable to wean of the vent and requiring trach placement 10/29/2012. He was transferred to [facility] in December 2014 and later to [facility] in late January 2015. He was on TNA and was doing fairly well initially, but developed more abdominal pain and on 2/23 taken to OR in [place] for gallbladder removal (secondary to cholecystitis), resection for part of the colon for fistula, IVC filter placement. Sent back to [facility] on march 4th, 2015 and started on TPN. Diet advanced on March 9th, 2015 after pt passed swallow evaluation. He was brought to [facility] March 16th, after noticing progressive abdominal distension. In ED, CT abdomen with air- fluid level and findings consistent with early partial SBO, ileus. Surgery consulted and [facility] asked to admit for further evaluation.   Principal Problem:  Abdominal pain  - secondary to ileus, partial SBO  - appreciate surgery input  - pt reports more pain in the abdominal area this AM  - plan for scanning the abd per surgery to ensure no acute etiologies  - continue supportive care with analgesia, antiemetics as needed for symptom control  Active Problems:  Atrial fibrillation  - rate in 120's  - will continue to monitor on telemetry, continue Cardizem and Pradaxa  - appreciate cardiology input  Leukocytosis  - likely secondary to ileus, ? HCAP as suggestive per CXR 3/20 - CT abd/pelvis ordered to make sure no need for  drainage  ? HCAP  - continue vancomycin and Maxipime day #2 to cover for HCAP Pulmonary vascular congestion  - bilateral rhonchi and trace bilateral pitting edema  - IVF stopped 3/20 due to pulmonary vascular congestion  - monitor I's/O's, daily weights  Hypokalemia - will supplement via IV route and repeat BMP in AM Anemia of chronic disease  - Hg stable over the past 24 hours  - no signs of active bleeding  Essential hypertension  - slightly on soft side this AM  - close monitoring  Tracheostomy status  - suctioning as needed  Hypernatremia  - secondary to pre renal etiology, Na trending down slowly  Moderate malnutrition  - secondary to acute on chronic illness as outlined above  - keep NPO for now  - SLP pending   Consultants:  Surgery  Cardiology  Procedures/Studies:  Ct Abdomen Pelvis W Contrast 03/28/2013 Findings consistent with persistent phlegmon in the liver. Interim removal of right upper quadrant drainage catheter. Colonic distention with air-fluid levels. Slight progression of soft tissue fluid in the anterior abdomen in the region the patient's scarring. 13 mm low-density lesion left kidney, not definite simple cyst.  Dg Abd 2 Views 03/30/2013 Mildly dilated loops of left upper quadrant small bowel with scattered internal air-fluid levels, similar to the prior exam, with abnormal but nonspecific pattern which could be due to could ileus or partial SBO.  Dg Abd 2 View 03/29/2013 Nonspecific bowel gas pattern, as above, suggestive of partial small bowel obstruction. No pneumoperitoneum.  Dg Abd Acute W/chest 03/28/2013 Persistent small bowel dilatation compatible with  either partial SBO or ileus. No change in aeration to the lungs compared with previous exam. Antibiotics:  Vancomycin 3/20 -->  Maxipime 3/20 -->  Code Status: Full  Family Communication: Son Onalee Hua over the phone  Disposition Plan: Remains inpatient   HPI/Subjective: No events overnight.    Objective: Filed Vitals:   04/02/13 0504 04/02/13 0829 04/02/13 1148 04/02/13 1154  BP: 111/77   110/77  Pulse: 131 113 126   Temp: 98.5 F (36.9 C)     TempSrc: Oral     Resp: 20 20 18    Height:      Weight:      SpO2: 98% 98% 100%     Intake/Output Summary (Last 24 hours) at 04/02/13 1337 Last data filed at 04/02/13 1208  Gross per 24 hour  Intake      0 ml  Output   1000 ml  Net  -1000 ml    Exam:   General:  Pt is alert, follows commands appropriately, not in acute distress  Cardiovascular: irregular rate and rhythm, no rubs, no gallops  Respiratory: Scattered rhonchi and bibasilar crackles   Abdomen: Soft, slightly tender in epigastric area, non distended, bowel sounds faint   Extremities: Trace bilateral LE pitting edema, pulses DP and PT palpable bilaterally  Data Reviewed: Basic Metabolic Panel:  Recent Labs Lab 03/29/13 0446 03/30/13 0305 03/31/13 0555 04/01/13 0406 04/02/13 0700  NA 157* 155* 151* 148* 149*  K 3.3* 3.8 3.7 4.9 3.2*  CL 121* 120* 116* 111 111  CO2 23 24 25 25 27   GLUCOSE 154* 162* 136* 126* 116*  BUN 20 18 13 12 12   CREATININE 1.05 1.00 0.97 0.88 0.93  CALCIUM 8.4 8.4 8.5 8.3* 8.3*  MG 2.2  --   --   --   --    Liver Function Tests:  Recent Labs Lab 03/28/13 1831  AST 26  ALT 30  ALKPHOS 67  BILITOT 0.4  PROT 5.6*  ALBUMIN 2.3*    Recent Labs Lab 03/28/13 1831  LIPASE 30   CBC:  Recent Labs Lab 03/28/13 1831 03/30/13 0305 03/31/13 0555 04/01/13 0406 04/02/13 0700  WBC 15.8* 11.4* 11.3* 15.0* 14.2*  NEUTROABS 13.5*  --   --   --   --   HGB 9.3* 8.7* 8.8* 10.4* 9.3*  HCT 30.1* 28.5* 28.8* 34.3* 30.2*  MCV 95.3 96.6 96.3 96.6 96.5  PLT 265 238 239 160 205   CBG:  Recent Labs Lab 04/01/13 1126 04/01/13 1654 04/01/13 2028 04/02/13 0650 04/02/13 1112  GLUCAP 164* 112* 119* 106* 123*    Recent Results (from the past 240 hour(s))  MRSA PCR SCREENING     Status: None   Collection Time     03/29/13  3:00 AM      Result Value Ref Range Status   MRSA by PCR NEGATIVE  NEGATIVE Final   Comment:            The GeneXpert MRSA Assay (FDA     approved for NASAL specimens     only), is one component of a     comprehensive MRSA colonization     surveillance program. It is not     intended to diagnose MRSA     infection nor to guide or     monitor treatment for     MRSA infections.  URINE CULTURE     Status: None   Collection Time    03/29/13  6:50 AM  Result Value Ref Range Status   Specimen Description URINE, RANDOM   Final   Special Requests NONE   Final   Culture  Setup Time     Final   Value: 03/29/2013 12:43     Performed at [facility]   Colony Count     Final   Value: NO GROWTH     Performed at [facility]   Culture     Final   Value: NO GROWTH     Performed at [facility]   Report Status 03/30/2013 FINAL   Final  CULTURE, BLOOD (ROUTINE X 2)     Status: None   Collection Time    04/01/13 10:55 AM      Result Value Ref Range Status   Specimen Description BLOOD LEFT HAND   Final   Special Requests BOTTLES DRAWN AEROBIC AND ANAEROBIC 5CC   Final   Culture  Setup Time     Final   Value: 04/01/2013 14:39     Performed at [facility]   Culture     Final   Value:        BLOOD CULTURE RECEIVED NO GROWTH TO DATE CULTURE WILL BE HELD FOR 5 DAYS BEFORE ISSUING A FINAL NEGATIVE REPORT     Performed at [facility]   Report Status PENDING   Incomplete  CULTURE, BLOOD (ROUTINE X 2)     Status: None   Collection Time    04/01/13 11:05 AM      Result Value Ref Range Status   Specimen Description BLOOD RIGHT HAND   Final   Special Requests BOTTLES DRAWN AEROBIC AND ANAEROBIC 5CC   Final   Culture  Setup Time     Final   Value: 04/01/2013 14:39     Performed at [facility]   Culture     Final   Value:        BLOOD CULTURE RECEIVED NO GROWTH TO DATE CULTURE WILL BE HELD FOR 5 DAYS BEFORE ISSUING A FINAL NEGATIVE  REPORT     Performed at [facility]   Report Status PENDING   Incomplete  CULTURE, RESPIRATORY (NON-EXPECTORATED)     Status: None   Collection Time    04/01/13  3:25 PM      Result Value Ref Range Status   Specimen Description TRACHEAL ASPIRATE   Final   Special Requests NONE   Final   Gram Stain     Final   Value: FEW WBC PRESENT,BOTH PMN AND MONONUCLEAR     NO SQUAMOUS EPITHELIAL CELLS SEEN     FEW GRAM POSITIVE RODS     RARE YEAST     Performed at [facility]   Culture     Final   Value: FEW GRAM NEGATIVE RODS     Performed at [facility]   Report Status PENDING   Incomplete     Scheduled Meds: . atorvastatin  40 mg Oral Daily  . ceFEPime (MAXIPIME) IV  1 g Intravenous Q8H  . chlorhexidine  15 mL Mouth/Throat BID  . dabigatran  150 mg Oral BID  . diltiazem  240 mg Oral Daily  . insulin aspart  0-9 Units Subcutaneous TID WC  . pantoprazole (PROTONIX) IV  40 mg Intravenous Q12H  . predniSONE  10 mg Oral Q breakfast  . roflumilast  500 mcg Oral Daily  . sodium chloride  3 mL Intravenous Q12H  . vancomycin  1,500  mg Intravenous Q24H   Continuous Infusions:   Debbora Presto, MD  [facility] Pager [phone]  If 7PM-7AM, please contact night-coverage [email or web address] Password [email or web address] 04/02/2013, 1:37 PM   LOS: 5 days

## 2013-04-02 NOTE — Progress Notes (Signed)
04-02-13    1905     IV Team Note;   Was stopped by an Charity fundraiserN in the hall, inquiring if "IV Team was going to check on the central line on the pt in room 10;"   Informed RN that IV Team had no record of a pt in that room with a central line;   Pt has a right IJ triple lumen central line, apparently placed on 03-28-13;  Unsure where pt had line placed; unable to locate cxr for tip placement from the date the line was placed;  Most recent cxr from 04-01-13 shows tip of central line in innominate vein;   RN reports that "the pt's entire right arm is extremely edematous;"  RN has notified the MD that the tip of the line is not in the SVC, and that the pt's right arm is edematous;   Suggest doppler to the right arm;   RN to stop ivf to central line per her conversation with the MD;  Will continue to assess .    Report to be given to on-coming shift;    Barkley BrunsLisa Bristol Osentoski RN  IV Team

## 2013-04-02 NOTE — Progress Notes (Signed)
MD Note: made aware of HR in 130's and unable to get CT.

## 2013-04-02 NOTE — Progress Notes (Signed)
ELECTROPHYSIOLOGY ROUNDING NOTE    Patient Name: Tim Short Date of Encounter: 04/02/2013    SUBJECTIVE:Patient denies chest pain or shortness of breath.  States he thinks he is still out of rhythm  Was on Amiodarone 10/2011-03/2012 with persistent atrial fibrillation despite repeat DCCV  TELEMETRY: Reviewed telemetry pt in atrial fib/flutter, rates 80-130's Filed Vitals:   04/01/13 2039 04/02/13 0035 04/02/13 0430 04/02/13 0504  BP:    111/77  Pulse: 133 130 129 131  Temp:    98.5 F (36.9 C)  TempSrc:    Oral  Resp: 18 18 18 20   Height:      Weight:      SpO2: 97% 96% 95% 98%    Intake/Output Summary (Last 24 hours) at 04/02/13 0731 Last data filed at 04/01/13 1700  Gross per 24 hour  Intake      0 ml  Output   1000 ml  Net  -1000 ml    CURRENT MEDICATIONS: . atorvastatin  40 mg Oral Daily  . ceFEPime (MAXIPIME) IV  1 g Intravenous Q8H  . chlorhexidine  15 mL Mouth/Throat BID  . dabigatran  150 mg Oral BID  . diltiazem  240 mg Oral Daily  . insulin aspart  0-9 Units Subcutaneous TID WC  . pantoprazole (PROTONIX) IV  40 mg Intravenous Q12H  . predniSONE  10 mg Oral Q breakfast  . roflumilast  500 mcg Oral Daily  . sodium chloride  3 mL Intravenous Q12H  . vancomycin  1,500 mg Intravenous Q24H    LABS: Basic Metabolic Panel:  Recent Labs  86/57/8403/19/15 0555 04/01/13 0406  NA 151* 148*  K 3.7 4.9  CL 116* 111  CO2 25 25  GLUCOSE 136* 126*  BUN 13 12  CREATININE 0.97 0.88  CALCIUM 8.5 8.3*   CBC:  Recent Labs  03/31/13 0555 04/01/13 0406  WBC 11.3* 15.0*  HGB 8.8* 10.4*  HCT 28.8* 34.3*  MCV 96.3 96.6  PLT 239 160     Radiology/Studies:  Ct Abdomen Pelvis W Contrast 03/28/2013   CLINICAL DATA:  Abdominal distention.  EXAM: CT ABDOMEN AND PELVIS WITH CONTRAST  TECHNIQUE: Multidetector CT imaging of the abdomen and pelvis was performed using the standard protocol following bolus administration of intravenous contrast.  CONTRAST:  100mL  OMNIPAQUE IOHEXOL 300 MG/ML  SOLN  COMPARISON:  DG ABD ACUTE W/CHEST dated 03/28/2013; CT ABD-PELV W/O CM dated 03/11/2013; CT ABD/PELVIS W CM dated 01/30/2013  FINDINGS: There is a new lucency is again noted in the liver. This remains unchanged in appearance and may be infectious in etiology. No clearcut fluid collection in or air collection is noted in the liver to suggest a definite abscess. These changes may be related to phlegmon. Close follow up to evaluate for developing abscess suggested. Hepatic veins patent. Splenic and portal veins patent. No focal significant splenic abnormality. Pancreas normal. Cholecystectomy. No biliary distention.  Adrenals normal. Stable approximate 13 mm low-density lesion in the left kidney, not definite simple cyst. As noted on prior study, MRI of the kidneys suggest for further evaluation. No hydronephrosis. The bladder is nondistended. No free pelvic fluid. Prostate is not enlarged. Calcifications within the prostate. No significant adenopathy. Abdominal aorta is atherosclerotic. No aneurysm. Visceral vessels are patent. Inferior vena caval filter noted in the IVC with tip just below the renal veins.  Previously identified drainage catheter in the right upper quadrant has been removed. Inflammatory changes are noted in the right mid abdomen, no abscess  noted. The patient has had a prior hemicolectomy. Reference is made to CT report of 01/31/2012 which discusses fistula changes within the abdomen. Patient status post right hemicolectomy. The colon is moderately distended and contains fluid levels. These changes have improved from prior exam suggesting improving ileus. These changes may be related to a diarrheal illness. Follow-up abdominal series suggested. No small bowel distention. The stomach is nondistended. No free air noted.  Atelectasis and/or infiltrates in the lung bases. Bilateral pleural effusions. Cardiomegaly. Coronary artery disease. Midline abdominal scar noted.  Fluid noted in the region of the scar with associated small amount of air. Developing abscess should be considered. These findings have progressed slightly from prior study. Multiple anterior abdominal wall subcutaneous nodules most likely injection granulomas.  IMPRESSION: 1. Findings consistent with persistent phlegmon in the liver. 2. Interim removal of right upper quadrant drainage catheter. 3. Colonic distention with air-fluid levels. These findings have improved from prior study suggesting improving adynamic ileus or diarrheal illness. Followup abdominal series suggested. 4. Slight progression of soft tissue fluid in the anterior abdomen in the region the patient's scarring. This could represent developing phlegmon/abscess. Small amount of air is noted within this fluid collection. 5. 13 mm low-density lesion left kidney, not definite simple cyst. As noted on prior study nonenhanced and enhanced MRI of the kidneys suggested for further evaluation.   Electronically Signed   By: Maisie Fus  Register   On: 03/28/2013 23:29   Dg Chest Port 1 View 04/01/2013   CLINICAL DATA:  Leukocytosis  EXAM: PORTABLE CHEST - 1 VIEW  COMPARISON:  03/28/2013  FINDINGS: Postsurgical changes are again seen. A right-sided jugular central line is noted in the right innominate vein stable from the prior exam. The cardiac shadow is stable. Left basilar consolidation with increasing effusion is noted.  IMPRESSION: Increasing left basilar infiltrate and effusion.   Electronically Signed   By: Alcide Clever M.D.   On: 04/01/2013 09:10   Dg Abd 2 Views 03/30/2013   CLINICAL DATA:  Colonic distention.  Multiple abdominal surgeries.  EXAM: ABDOMEN - 2 VIEW  COMPARISON:  DG ABD 2 VIEWS dated 03/29/2013; CT ABD/PELVIS W CM dated 03/28/2013  FINDINGS: Trace blunting of the right costophrenic angle. No free intraperitoneal gas.  Dilated loops of left upper quadrant small bowel measure up to 4 cm, similar to the prior exam. There are several air-fluid  levels within these small bowel loops. Reduced colonic gas compared to the prior exam. IVC filter noted.  IMPRESSION: 1. Mildly dilated loops of left upper quadrant small bowel with scattered internal air-fluid levels, similar to the prior exam, with abnormal but nonspecific pattern which could be due to could ileus or partial small bowel obstruction. Correlate with bowel sounds and bowel function.   Electronically Signed   By: Herbie Baltimore M.D.   On: 03/30/2013 10:43    PHYSICAL EXAM trached Coarse breathsounds Fast and irregular Multiple tubes     Principal Problem:   Abdominal pain Active Problems:   Atrial fibrillation   Essential hypertension   Tracheostomy status   Ileus   Hypernatremia  would resume amio when taking po and then cardioversion

## 2013-04-02 NOTE — Progress Notes (Signed)
  Subjective: Hard of hearing, says abdomen hurts more  Objective: Vital signs in last 24 hours: Temp:  [97.7 F (36.5 C)-98.5 F (36.9 C)] 98.5 F (36.9 C) (03/21 0504) Pulse Rate:  [113-134] 113 (03/21 0829) Resp:  [18-20] 20 (03/21 0829) BP: (101-111)/(74-78) 111/77 mmHg (03/21 0504) SpO2:  [92 %-100 %] 98 % (03/21 0829) FiO2 (%):  [60 %] 60 % (03/21 0829) Last BM Date: 04/01/13  Intake/Output from previous day: 03/20 0701 - 03/21 0700 In: 0  Out: 1000 [Urine:1000] Intake/Output this shift:    GI: soft mild tender superficial opening inferior portion of wound no infection, some bs present  Lab Results:   Recent Labs  04/01/13 0406 04/02/13 0700  WBC 15.0* 14.2*  HGB 10.4* 9.3*  HCT 34.3* 30.2*  PLT 160 205   BMET  Recent Labs  04/01/13 0406 04/02/13 0700  NA 148* 149*  K 4.9 3.2*  CL 111 111  CO2 25 27  GLUCOSE 126* 116*  BUN 12 12  CREATININE 0.88 0.93  CALCIUM 8.3* 8.3*   PT/INR No results found for this basename: LABPROT, INR,  in the last 72 hours ABG No results found for this basename: PHART, PCO2, PO2, HCO3,  in the last 72 hours  Studies/Results: Dg Chest Port 1 View  04/01/2013   CLINICAL DATA:  Leukocytosis  EXAM: PORTABLE CHEST - 1 VIEW  COMPARISON:  03/28/2013  FINDINGS: Postsurgical changes are again seen. A right-sided jugular central line is noted in the right innominate vein stable from the prior exam. The cardiac shadow is stable. Left basilar consolidation with increasing effusion is noted.  IMPRESSION: Increasing left basilar infiltrate and effusion.   Electronically Signed   By: Alcide CleverMark  Lukens M.D.   On: 04/01/2013 09:10    Anti-infectives: Anti-infectives   Start     Dose/Rate Route Frequency Ordered Stop   04/01/13 1230  vancomycin (VANCOCIN) 1,500 mg in sodium chloride 0.9 % 500 mL IVPB     1,500 mg 250 mL/hr over 120 Minutes Intravenous Every 24 hours 04/01/13 1150     04/01/13 1030  ceFEPIme (MAXIPIME) 1 g in dextrose 5 %  50 mL IVPB     1 g 100 mL/hr over 30 Minutes Intravenous Every 8 hours 04/01/13 1011 04/09/13 1029   03/29/13 1000  vancomycin (VANCOCIN) 1,500 mg in sodium chloride 0.9 % 500 mL IVPB  Status:  Discontinued     1,500 mg 250 mL/hr over 120 Minutes Intravenous Daily 03/29/13 0245 03/29/13 1117   03/29/13 0600  piperacillin-tazobactam (ZOSYN) IVPB 3.375 g  Status:  Discontinued     3.375 g 12.5 mL/hr over 240 Minutes Intravenous 3 times per day 03/29/13 0301 03/29/13 1117   03/29/13 0245  piperacillin-tazobactam (ZOSYN) injection 3.375 g  Status:  Discontinued     3.375 g Intramuscular Every 6 hours 03/29/13 0245 03/29/13 0300      Assessment/Plan: S/p multiple procedures outside institutions  He says abdomen hurts more, is apparently having bowel function but wbc persistently up and had abnl area on liver several days ago.  Will rescan today to make sure nothing needs to be drained  [facility] LLCWAKEFIELD,Tagan Bartram 04/02/2013

## 2013-04-03 ENCOUNTER — Inpatient Hospital Stay (HOSPITAL_COMMUNITY): Payer: Medicare HMO

## 2013-04-03 DIAGNOSIS — M7989 Other specified soft tissue disorders: Secondary | ICD-10-CM

## 2013-04-03 LAB — GLUCOSE, CAPILLARY
GLUCOSE-CAPILLARY: 102 mg/dL — AB (ref 70–99)
Glucose-Capillary: 103 mg/dL — ABNORMAL HIGH (ref 70–99)
Glucose-Capillary: 110 mg/dL — ABNORMAL HIGH (ref 70–99)
Glucose-Capillary: 113 mg/dL — ABNORMAL HIGH (ref 70–99)

## 2013-04-03 LAB — BASIC METABOLIC PANEL
BUN: 13 mg/dL (ref 6–23)
CO2: 25 mEq/L (ref 19–32)
Calcium: 8.4 mg/dL (ref 8.4–10.5)
Chloride: 110 mEq/L (ref 96–112)
Creatinine, Ser: 0.92 mg/dL (ref 0.50–1.35)
GFR, EST NON AFRICAN AMERICAN: 79 mL/min — AB (ref >90)
GLUCOSE: 105 mg/dL — AB (ref 70–99)
POTASSIUM: 3.3 meq/L — AB (ref 3.7–5.3)
SODIUM: 148 meq/L — AB (ref 137–147)

## 2013-04-03 LAB — CBC
HCT: 32.2 % — ABNORMAL LOW (ref 39.0–52.0)
Hemoglobin: 9.8 g/dL — ABNORMAL LOW (ref 13.0–17.0)
MCH: 29.3 pg (ref 26.0–34.0)
MCHC: 30.4 g/dL (ref 30.0–36.0)
MCV: 96.1 fL (ref 78.0–100.0)
Platelets: 198 10*3/uL (ref 150–400)
RBC: 3.35 MIL/uL — ABNORMAL LOW (ref 4.22–5.81)
RDW: 20.8 % — AB (ref 11.5–15.5)
WBC: 14 10*3/uL — ABNORMAL HIGH (ref 4.0–10.5)

## 2013-04-03 LAB — CULTURE, RESPIRATORY W GRAM STAIN

## 2013-04-03 LAB — CULTURE, RESPIRATORY

## 2013-04-03 MED ORDER — PANTOPRAZOLE SODIUM 40 MG IV SOLR
40.0000 mg | INTRAVENOUS | Status: DC
  Administered 2013-04-04: 40 mg via INTRAVENOUS
  Filled 2013-04-03: qty 40

## 2013-04-03 MED ORDER — SODIUM CHLORIDE 0.9 % IJ SOLN
10.0000 mL | Freq: Two times a day (BID) | INTRAMUSCULAR | Status: DC
  Administered 2013-04-06 – 2013-04-28 (×17): 10 mL
  Administered 2013-04-29: 20 mL
  Administered 2013-04-29 – 2013-05-04 (×9): 10 mL

## 2013-04-03 MED ORDER — SODIUM CHLORIDE 0.9 % IJ SOLN
10.0000 mL | INTRAMUSCULAR | Status: DC | PRN
  Administered 2013-04-03: 20 mL
  Administered 2013-04-04 – 2013-04-28 (×8): 10 mL

## 2013-04-03 MED ORDER — AMIODARONE HCL 200 MG PO TABS
400.0000 mg | ORAL_TABLET | Freq: Two times a day (BID) | ORAL | Status: DC
  Administered 2013-04-03 – 2013-04-13 (×22): 400 mg via ORAL
  Filled 2013-04-03 (×25): qty 2

## 2013-04-03 MED ORDER — METOPROLOL TARTRATE 1 MG/ML IV SOLN
10.0000 mg | INTRAVENOUS | Status: DC | PRN
  Administered 2013-04-04: 4 mg via INTRAVENOUS
  Administered 2013-04-04 – 2013-04-11 (×2): 10 mg via INTRAVENOUS
  Filled 2013-04-03 (×4): qty 10

## 2013-04-03 NOTE — Progress Notes (Signed)
    Subjective:  Denies CP or dyspnea; complains of palpitations; mild abdominal pain   Objective:  Filed Vitals:   04/03/13 0425 04/03/13 0533 04/03/13 0756 04/03/13 1153  BP:  114/62    Pulse:  133 130 135  Temp:  97.9 F (36.6 C)    TempSrc:  Oral    Resp:  17 20 20   Height:      Weight:      SpO2: 94% 96% 98% 96%    Intake/Output from previous day:  Intake/Output Summary (Last 24 hours) at 04/03/13 1201 Last data filed at 04/03/13 0534  Gross per 24 hour  Intake      0 ml  Output   2750 ml  Net  -2750 ml    Physical Exam: Physical exam: Well-developed chronically ill appearing in no acute distress.  Skin is warm and dry.  HEENT is normal.  Neck is supple.  Chest with mildly diminished BS bases Cardiovascular exam is tachycardic Abdominal exam s/p abdominal surgery Extremities show no edema. neuro grossly intact    Lab Results: Basic Metabolic Panel:  Recent Labs  16/10/9601/21/15 0700 04/03/13 0505  NA 149* 148*  K 3.2* 3.3*  CL 111 110  CO2 27 25  GLUCOSE 116* 105*  BUN 12 13  CREATININE 0.93 0.92  CALCIUM 8.3* 8.4   CBC:  Recent Labs  04/02/13 0700 04/03/13 0505  WBC 14.2* 14.0*  HGB 9.3* 9.8*  HCT 30.2* 32.2*  MCV 96.5 96.1  PLT 205 198     Assessment/Plan:  1 atrial flutter-patient remains in atrial flutter. [facility] with [facility]. Plan to continue pradaxa. Add amiodarone. Per SK, plan DCCV once amiodarone initiated. 2 Coronary artery disease-continue statin. 3 ileus-management per general surgery. 4 hypertension-continue present medications.  Olga MillersBrian Crenshaw 04/03/2013, 12:01 PM

## 2013-04-03 NOTE — Progress Notes (Signed)
*  PRELIMINARY RESULTS* Vascular Ultrasound Right upper extremity venous duplex has been completed.  Preliminary findings:   No evidence of DVT or superficial thrombosis.     Farrel DemarkJill Eunice, RDMS, RVT  04/03/2013, 1:58 PM

## 2013-04-03 NOTE — Progress Notes (Signed)
Patient ID: Tim Short, male   DOB: [birth year], 79 y.o.   MRN: [id number]    Subjective: Hard of hearing, denies nausea, states pain is a 5/10.    Objective: Vital signs in last 24 hours: Temp:  [97.9 F (36.6 C)-98.2 F (36.8 C)] 97.9 F (36.6 C) (03/22 0533) Pulse Rate:  [92-133] 130 (03/22 0756) Resp:  [17-20] 20 (03/22 0756) BP: (110-114)/(62-77) 114/62 mmHg (03/22 0533) SpO2:  [94 %-100 %] 98 % (03/22 0756) FiO2 (%):  [28 %-40 %] 28 % (03/22 0756) Last BM Date: 04/01/13  Intake/Output from previous day: 03/21 0701 - 03/22 0700 In: 0  Out: 2750 [Urine:2750] Intake/Output this shift:   Alert, no distress GI: soft mild tender superficial opening inferior portion of wound no infection, some bs present  Lab Results:   Recent Labs  04/02/13 0700 04/03/13 0505  WBC 14.2* 14.0*  HGB 9.3* 9.8*  HCT 30.2* 32.2*  PLT 205 198   BMET  Recent Labs  04/02/13 0700 04/03/13 0505  NA 149* 148*  K 3.2* 3.3*  CL 111 110  CO2 27 25  GLUCOSE 116* 105*  BUN 12 13  CREATININE 0.93 0.92  CALCIUM 8.3* 8.4   PT/INR No results found for this basename: LABPROT, INR,  in the last 72 hours ABG No results found for this basename: PHART, PCO2, PO2, HCO3,  in the last 72 hours  Studies/Results: Dg Chest Port 1 View  04/03/2013   CLINICAL DATA:  Central venous catheter placement  EXAM: PORTABLE CHEST - 1 VIEW  COMPARISON:  DG CHEST 1V PORT dated 04/01/2013  FINDINGS: Left upper extremity PICC is been placed. Tip is in the lower SVC. Tracheostomy tube and left atrial appendage clip stable. Left pleural effusion and basilar consolidation stable. Vascular congestion increased.  IMPRESSION: Left PICC placed with its tip at the lower SVC.  Increased vascular congestion.  Stable left basilar consolidation and left pleural effusion.   Electronically Signed   By: Maryclare Bean M.D.   On: 04/03/2013 09:04   Dg Swallowing Func-speech Pathology  04/02/2013   [name], CCC-SLP      04/02/2013  2:03 PM Objective Swallowing Evaluation: Modified Barium Swallowing Study   Patient Details  Name: Tim Short MRN: [id number] Date of Birth: Feb 23, 1934  Today's Date: 04/02/2013 Time: 1225-1250 SLP Time Calculation (min): 25 min  Past Medical History:  Past Medical History  Diagnosis Date  . Hypertension   . Hypercholesteremia   . Asthma   . Meniere disease   . Persistent atrial fibrillation     a. s/p multiple dccv's;  b. failed amio;  c. not felt to be AF  RFCA canddiate due to LA dil;  d. s/p failed hybrid ablation @  [facility] in 09/2012;  e. chronic pradaxa.  . Obesity   . Biatrial enlargement     LA size 5.3cm  . Obstructive sleep apnea     AHI 108/hr now on CPAP at 12cm H2O  . H/O hiatal hernia   . GERD (gastroesophageal reflux disease)     "associated w/hiatal hernia" (24-Jun-2012)  . Peptic ulcer 1950's  . Migraines     "haven't had one for about 15 years" (06-24-12)  . Arthritis     "joints" (06-24-12)  . Chronic lower back pain   . Nephrolithiasis ~ 1960's    "passed on their own" (2012-06-24)  . History of pneumonia 2002; 2006    "spent 8 days in isolation; Norovirus" (06/24/2012)  .  Other and unspecified angina pectoris   . RLS (restless legs syndrome)   . Coronary artery disease     a. 05/2012 Cath/PCI: LM nl, LAD nl, LCX 66m (3.0x15 Integrity  BMS), PTCA of OM1 through stent struts (kissing balloon).  . Diabetes mellitus without complication     FAMILY STATES PATIENT IS NOT DIABETIC   Past Surgical History:  Past Surgical History  Procedure Laterality Date  . Wrist fracture surgery  1992    "repaired w/left hip bone graft" (06/04/2012)  . Total knee arthroplasty  08/19/1999  . Colonoscopy w/ polypectomy  2006  . Knee arthroscopy with meniscal repair Right 1974    "medial meniscus repaired" (06/04/2012)  . Cardioversion  10/02/2011    Procedure: CARDIOVERSION;  Surgeon: Corky Crafts, MD;   Location: [facility] ENDOSCOPY;  Service: Cardiovascular;  Laterality:  N/A;  h/p in file drawer  . Cardioversion   11/07/2011    Procedure: CARDIOVERSION;  Surgeon: Corky Crafts, MD;   Location: [facility] ENDOSCOPY;  Service: Cardiovascular;  Laterality:  N/A;  h/p from 10/22 in file drawer/dl  . Cardiac catheterization  05/26/2012  . Coronary angioplasty with stent placement  06/04/2012    "1" (06/04/2012)  . Cataract extraction w/ intraocular lens  implant, bilateral   2009  . Hemorrhoid surgery  ~ 2003  . Hiatal hernia repair  1983  . Nissen fundoplication  1983  . Ablation of dysrhythmic focus  SEPT/OCT 2014    ATRIAL FIB  . Inguinal hernia repair Bilateral 2000 and 2005    "one done at a time" (06/04/2012)  . Colon surgery  10/08/2012   . 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 Pt was in  prolonged respiratory failure at [facility], unable to wean of the vent  and requiring trach placement 10/29/2012. He was transferred to  [facility] in December 2014 and later to [facility] in late January  2015. He was on TNA and was doing fairly well initially, but  developed more abdominal pain and on 2/23 taken to OR in [place]  for gallbladder removal (secondary to cholecystitis), resection  for part of the colon for fistula, IVC filter placement. Sent  back to [facility] on march 4th, 2015 and started on TPN. Diet  advanced on March 9th, 2015 after pt passed swallow evaluation.  He was brought to [facility] March 16th, after noticing progressive  abdominal distension. In ED, CT abdomen with air- fluid level and  findings consistent with early partial SBO, ileus. Surgery  reports pt is ok for PO from their standpoint, SLP ordered for  swallow eval.      Assessment / Plan / Recommendation Clinical Impression  Dysphagia Diagnosis: Moderate pharyngeal phase dysphagia;Severe   pharyngeal phase dysphagia Clinical impression: MBS completed with pt's PMSV donned and  appears to be congruent with study performed at [facility].  He  demonstrated moderate-severe motor based pharyngeal dyspahgia as  evidenced by reduced tongue base retraction, decreased laryngeal  elevation and decreased pharyngeal contraction.  Impairments led  to mild pharyngeal residue in vallecule/pyriform sinuses and  posterior pharyngeal wall and laryngeal vestibule consistently  silent penetration to vocal cords.  Verbal cues for hard cough  briefly cleared vestibule with subsequent penetration from  residue.  SLP recommends Dys 1 (puree) only, no liquids (pudding  thick liquid) with speaking valve donned, two swallows,  volitional coughs, pills crushed in applesauce and full  supervision/assist.       Treatment Recommendation  Therapy as outlined in treatment plan below    Diet Recommendation Dysphagia 1 (Puree);Pudding-thick liquid   Medication Administration: Crushed with puree Supervision: Patient able to self feed;Full supervision/cueing  for compensatory strategies Compensations: Slow rate;Small sips/bites;Multiple dry swallows  after each bite/sip;Hard cough after swallow Postural Changes and/or Swallow Maneuvers: Seated upright 90  degrees;Upright 30-60 min after meal, wear speaking valve during  all po's    Other  Recommendations Oral Care Recommendations: Oral care BID Other Recommendations: Order thickener from pharmacy   Follow Up Recommendations  Skilled Nursing facility    Frequency and Duration min 2x/week  2 weeks   Pertinent Vitals/Pain WDL            Reason for Referral Objectively evaluate swallowing function   Oral Phase Oral Preparation/Oral Phase Oral Phase: WFL (WFL for textures assessed)   Pharyngeal Phase Pharyngeal Phase Pharyngeal Phase: Impaired Pharyngeal - Honey Pharyngeal - Honey Teaspoon: Reduced pharyngeal  peristalsis;Pharyngeal residue - valleculae;Reduced tongue base   retraction;Penetration/Aspiration during swallow;Pharyngeal  residue - pyriform sinuses;Reduced laryngeal elevation Penetration/Aspiration details (honey teaspoon): Material enters  airway, CONTACTS cords and not ejected out Pharyngeal - Honey Cup: Penetration/Aspiration during  swallow;Pharyngeal residue - valleculae;Pharyngeal residue -  pyriform sinuses;Reduced tongue base retraction;Reduced laryngeal  elevation Penetration/Aspiration details (honey cup): Material enters  airway, remains ABOVE vocal cords then ejected out Pharyngeal - Solids Pharyngeal - Puree: Pharyngeal residue - valleculae;Reduced  tongue base retraction;Pharyngeal residue - pyriform  sinuses;Reduced laryngeal elevation  Cervical Esophageal Phase        Cervical Esophageal Phase Cervical Esophageal Phase: [facility]         Darrow BussingLisa Willis Litaker M.Ed CCC-SLP Pager [phone]  04/02/2013    Anti-infectives: Anti-infectives   Start     Dose/Rate Route Frequency Ordered Stop   04/01/13 1230  vancomycin (VANCOCIN) 1,500 mg in sodium chloride 0.9 % 500 mL IVPB     1,500 mg 250 mL/hr over 120 Minutes Intravenous Every 24 hours 04/01/13 1150     04/01/13 1030  ceFEPIme (MAXIPIME) 1 g in dextrose 5 % 50 mL IVPB     1 g 100 mL/hr over 30 Minutes Intravenous Every 8 hours 04/01/13 1011 04/09/13 1029   03/29/13 1000  vancomycin (VANCOCIN) 1,500 mg in sodium chloride 0.9 % 500 mL IVPB  Status:  Discontinued     1,500 mg 250 mL/hr over 120 Minutes Intravenous Daily 03/29/13 0245 03/29/13 1117   03/29/13 0600  piperacillin-tazobactam (ZOSYN) IVPB 3.375 g  Status:  Discontinued     3.375 g 12.5 mL/hr over 240 Minutes Intravenous 3 times per day 03/29/13 0301 03/29/13 1117   03/29/13 0245  piperacillin-tazobactam (ZOSYN) injection 3.375 g  Status:  Discontinued     3.375 g Intramuscular Every 6 hours 03/29/13 0245 03/29/13 0300      Assessment/Plan: S/p multiple procedures outside institutions  Scan not done yesterday.  Will get today given  persistent white count.    [facility]ER[facility],Kiyra Slaubaugh 04/03/2013

## 2013-04-03 NOTE — Progress Notes (Signed)
Paged concerning patient's continued high heart rate, and administration of Pradaxa. Per patient and RN notes patient has been getting his Pradaxa by breaking open the capsules. Per pharmacy cannot be administered in this fashion. Will hold tonight's dose, pharmacy and cardiology will need to decide if they are going to use this medication in this manner (off label) and it needs to be charted.   Pradaxa administration -See above  Tachycardia  -Review of patient's cardiology note shows that cardiology aware of patient's high heart rate and plan for cardioversion. Also patient placed on amiodarone per cardiology so we'll not change -will increase metoprolol IV in order to better control patient's HR

## 2013-04-03 NOTE — Progress Notes (Signed)
Patient ID: Tim Short, male   DOB: 11/27/1934, 79 y.o.   MRN: [id number]  [facility] PROGRESS NOTE  Tim Short:[id number] DOB: Apr 24, 1934 DOA: 03/28/2013 PCP:  Duane Lope, MD  Brief narrative:  Pt is 79 yo male with fairly recent hospitalization at [facility] in September 2014 for failed maize procedure and during that hospitalization pt developed volvulus and has required right colectomy (10/08/2012), subsequently developed multiple abscesses and enteric fistulas managed percutaneous drains. Pt was in prolonged respiratory failure at [facility], unable to wean of the vent and requiring trach placement 10/29/2012. He was transferred to [facility] in December 2014 and later to [facility] in late January 2015. He was on TNA and was doing fairly well initially, but developed more abdominal pain and on 2/23 taken to OR in [place] for gallbladder removal (secondary to cholecystitis), resection for part of the colon for fistula, IVC filter placement. Sent back to [facility] on march 4th, 2015 and started on TPN. Diet advanced on March 9th, 2015 after pt passed swallow evaluation. He was brought to [facility] March 16th, after noticing progressive abdominal distension. In ED, CT abdomen with air- fluid level and findings consistent with early partial SBO, ileus. Surgery consulted and [facility] asked to admit for further evaluation.   Principal Problem:  Abdominal pain  - secondary to ileus, partial SBO  - appreciate surgery input  - plan for CT abd today  - continue supportive care with analgesia, antiemetics as needed for symptom control  Active Problems:  Atrial fibrillation  - rate in 120's - 130's - will continue to monitor on telemetry, continue Cardizem and Pradaxa  - appreciate cardiology input  Leukocytosis  - likely secondary to ileus, ? HCAP as suggestive per CXR 3/20  - CT abd/pelvis ordered to make sure no need for drainage and will be done today  ? HCAP  - continue vancomycin and Maxipime day #3 to cover for  HCAP  Pulmonary vascular congestion  - bilateral rhonchi and trace bilateral pitting edema  - IVF stopped 3/20 due to pulmonary vascular congestion  - monitor I's/O's, daily weights (203 lbs this AM) Hypokalemia  - will supplement via IV route and repeat BMP in AM  Anemia of chronic disease  - Hg stable over the past 24 hours  - no signs of active bleeding  Essential hypertension  - slightly on soft side this AM  - close monitoring  Tracheostomy status  - suctioning as needed  Hypernatremia  - secondary to pre renal etiology, Na trending down slowly  Moderate malnutrition  - secondary to acute on chronic illness as outlined above  - per SLP, pudding thick liquids allowed  Consultants:  Surgery  Cardiology  Procedures/Studies:  Ct Abdomen Pelvis W Contrast 03/28/2013 Findings consistent with persistent phlegmon in the liver. Interim removal of right upper quadrant drainage catheter. Colonic distention with air-fluid levels. Slight progression of soft tissue fluid in the anterior abdomen in the region the patient's scarring. 13 mm low-density lesion left kidney, not definite simple cyst.  Dg Abd 2 Views 03/30/2013 Mildly dilated loops of left upper quadrant small bowel with scattered internal air-fluid levels, similar to the prior exam, with abnormal but nonspecific pattern which could be due to could ileus or partial SBO.  Dg Abd 2 View 03/29/2013 Nonspecific bowel gas pattern, as above, suggestive of partial small bowel obstruction. No pneumoperitoneum.  Dg Abd Acute W/chest 03/28/2013 Persistent small bowel dilatation compatible with either partial SBO or ileus. No change in aeration  to the lungs compared with previous exam. Antibiotics:  Vancomycin 3/20 -->  Maxipime 3/20 -->  Code Status: Full  Family Communication: Son Onalee Hua over the phone  Disposition Plan: Remains inpatient   HPI/Subjective: No events overnight.   Objective: Filed Vitals:   04/02/13 2048 04/02/13 2327  04/03/13 0425 04/03/13 0533  BP:    114/62  Pulse: 96 125  133  Temp:    97.9 F (36.6 C)  TempSrc:    Oral  Resp: 17 20  17   Height:      Weight:      SpO2: 97% 97% 94% 96%    Intake/Output Summary (Last 24 hours) at 04/03/13 0743 Last data filed at 04/03/13 0534  Gross per 24 hour  Intake      0 ml  Output   2750 ml  Net  -2750 ml    Exam:   General:  Pt is alert, follows commands appropriately, not in acute distress  Cardiovascular: Irregular rate and rhythm, no murmurs, no rubs, no gallops  Respiratory: Bibasilar rhonchi   Abdomen: Soft, non tender, non distended, bowel sounds present, no guarding  Extremities: Trace bilateral LE pitting edema, pulses DP and PT palpable bilaterally  Neuro: Grossly nonfocal  Data Reviewed: Basic Metabolic Panel:  Recent Labs Lab 03/29/13 0446 03/30/13 0305 03/31/13 0555 04/01/13 0406 04/02/13 0700 04/03/13 0505  NA 157* 155* 151* 148* 149* 148*  K 3.3* 3.8 3.7 4.9 3.2* 3.3*  CL 121* 120* 116* 111 111 110  CO2 23 24 25 25 27 25   GLUCOSE 154* 162* 136* 126* 116* 105*  BUN 20 18 13 12 12 13   CREATININE 1.05 1.00 0.97 0.88 0.93 0.92  CALCIUM 8.4 8.4 8.5 8.3* 8.3* 8.4  MG 2.2  --   --   --   --   --    Liver Function Tests:  Recent Labs Lab 03/28/13 1831  AST 26  ALT 30  ALKPHOS 67  BILITOT 0.4  PROT 5.6*  ALBUMIN 2.3*    Recent Labs Lab 03/28/13 1831  LIPASE 30   CBC:  Recent Labs Lab 03/28/13 1831 03/30/13 0305 03/31/13 0555 04/01/13 0406 04/02/13 0700 04/03/13 0505  WBC 15.8* 11.4* 11.3* 15.0* 14.2* 14.0*  NEUTROABS 13.5*  --   --   --   --   --   HGB 9.3* 8.7* 8.8* 10.4* 9.3* 9.8*  HCT 30.1* 28.5* 28.8* 34.3* 30.2* 32.2*  MCV 95.3 96.6 96.3 96.6 96.5 96.1  PLT 265 238 239 160 205 198   CBG:  Recent Labs Lab 04/02/13 0650 04/02/13 1112 04/02/13 1612 04/02/13 2118 04/03/13 0614  GLUCAP 106* 123* 121* 85 103*    Recent Results (from the past 240 hour(s))  MRSA PCR SCREENING      Status: None   Collection Time    03/29/13  3:00 AM      Result Value Ref Range Status   MRSA by PCR NEGATIVE  NEGATIVE Final   Comment:            The GeneXpert MRSA Assay (FDA     approved for NASAL specimens     only), is one component of a     comprehensive MRSA colonization     surveillance program. It is not     intended to diagnose MRSA     infection nor to guide or     monitor treatment for     MRSA infections.  URINE CULTURE     Status: None  Collection Time    03/29/13  6:50 AM      Result Value Ref Range Status   Specimen Description URINE, RANDOM   Final   Special Requests NONE   Final   Culture  Setup Time     Final   Value: 03/29/2013 12:43     Performed at [facility]   Colony Count     Final   Value: NO GROWTH     Performed at [facility]   Culture     Final   Value: NO GROWTH     Performed at [facility]   Report Status 03/30/2013 FINAL   Final  CULTURE, BLOOD (ROUTINE X 2)     Status: None   Collection Time    04/01/13 10:55 AM      Result Value Ref Range Status   Specimen Description BLOOD LEFT HAND   Final   Special Requests BOTTLES DRAWN AEROBIC AND ANAEROBIC 5CC   Final   Culture  Setup Time     Final   Value: 04/01/2013 14:39     Performed at [facility]   Culture     Final   Value:        BLOOD CULTURE RECEIVED NO GROWTH TO DATE CULTURE WILL BE HELD FOR 5 DAYS BEFORE ISSUING A FINAL NEGATIVE REPORT     Performed at [facility]   Report Status PENDING   Incomplete  CULTURE, BLOOD (ROUTINE X 2)     Status: None   Collection Time    04/01/13 11:05 AM      Result Value Ref Range Status   Specimen Description BLOOD RIGHT HAND   Final   Special Requests BOTTLES DRAWN AEROBIC AND ANAEROBIC 5CC   Final   Culture  Setup Time     Final   Value: 04/01/2013 14:39     Performed at [facility]   Culture     Final   Value:        BLOOD CULTURE RECEIVED NO GROWTH TO DATE CULTURE WILL BE HELD FOR 5  DAYS BEFORE ISSUING A FINAL NEGATIVE REPORT     Performed at [facility]   Report Status PENDING   Incomplete  CULTURE, RESPIRATORY (NON-EXPECTORATED)     Status: None   Collection Time    04/01/13  3:25 PM      Result Value Ref Range Status   Specimen Description TRACHEAL ASPIRATE   Final   Special Requests NONE   Final   Gram Stain     Final   Value: FEW WBC PRESENT,BOTH PMN AND MONONUCLEAR     NO SQUAMOUS EPITHELIAL CELLS SEEN     FEW GRAM POSITIVE RODS     RARE YEAST     Performed at [facility]   Culture     Final   Value: FEW GRAM NEGATIVE RODS     Performed at [facility]   Report Status PENDING   Incomplete  CULTURE, RESPIRATORY (NON-EXPECTORATED)     Status: None   Collection Time    04/02/13  8:34 AM      Result Value Ref Range Status   Specimen Description TRACHEAL ASPIRATE   Final   Special Requests NONE   Final   Gram Stain     Final   Value: ABUNDANT WBC PRESENT, PREDOMINANTLY PMN     MODERATE SQUAMOUS EPITHELIAL CELLS PRESENT     FEW GRAM POSITIVE COCCI IN PAIRS  IN CLUSTERS IN CHAINS FEW GRAM NEGATIVE COCCI     Performed at [facility]   Culture PENDING   Incomplete   Report Status PENDING   Incomplete     Scheduled Meds: . atorvastatin  40 mg Oral Daily  . ceFEPime (MAXIPIME) IV  1 g Intravenous Q8H  . chlorhexidine  15 mL Mouth/Throat BID  . dabigatran  150 mg Oral BID  . diltiazem  240 mg Oral Daily  . insulin aspart  0-9 Units Subcutaneous TID WC  . pantoprazole (PROTONIX) IV  40 mg Intravenous Q12H  . predniSONE  5 mg Oral Q breakfast  . roflumilast  500 mcg Oral Daily  . sodium chloride  3 mL Intravenous Q12H  . vancomycin  1,500 mg Intravenous Q24H   Continuous Infusions:    Debbora Presto, MD  [facility] Pager [phone]  If 7PM-7AM, please contact night-coverage [email or web address] Password [facility] 04/03/2013, 7:43 AM   LOS: 6 days

## 2013-04-03 NOTE — Progress Notes (Signed)
Peripherally Inserted Central Catheter/Midline Placement  The IV Nurse has discussed with the patient and/or persons authorized to consent for the patient, the purpose of this procedure and the potential benefits and risks involved with this procedure.  The benefits include less needle sticks, lab draws from the catheter and patient may be discharged home with the catheter.  Risks include, but not limited to, infection, bleeding, blood clot (thrombus formation), and puncture of an artery; nerve damage and irregular heat beat.  Alternatives to this procedure were also discussed.  PICC/Midline Placement Documentation  PICC / Midline Double Lumen 04/03/13 PICC Left Basilic 45 cm 0 cm (Active)  Indication for Insertion or Continuance of Line Vasoactive infusions 04/03/2013  8:36 AM  Exposed Catheter (cm) 0 cm 04/03/2013  8:36 AM  Lumen #1 Status Flushed;Saline locked;Blood return noted 04/03/2013  8:36 AM  Lumen #2 Status Flushed;Saline locked;Blood return noted 04/03/2013  8:36 AM  Dressing Change Due 04/10/13 04/03/2013  8:36 AM       Ethelda Chickurrie, Ariele Vidrio Robert 04/03/2013, 8:44 AM

## 2013-04-04 ENCOUNTER — Inpatient Hospital Stay (HOSPITAL_COMMUNITY): Payer: Medicare HMO

## 2013-04-04 ENCOUNTER — Encounter (HOSPITAL_COMMUNITY): Payer: Self-pay | Admitting: Radiology

## 2013-04-04 LAB — CBC
HEMATOCRIT: 29.4 % — AB (ref 39.0–52.0)
Hemoglobin: 9.1 g/dL — ABNORMAL LOW (ref 13.0–17.0)
MCH: 29.6 pg (ref 26.0–34.0)
MCHC: 31 g/dL (ref 30.0–36.0)
MCV: 95.8 fL (ref 78.0–100.0)
Platelets: 191 10*3/uL (ref 150–400)
RBC: 3.07 MIL/uL — ABNORMAL LOW (ref 4.22–5.81)
RDW: 20.7 % — ABNORMAL HIGH (ref 11.5–15.5)
WBC: 11.9 10*3/uL — ABNORMAL HIGH (ref 4.0–10.5)

## 2013-04-04 LAB — CULTURE, RESPIRATORY

## 2013-04-04 LAB — GLUCOSE, CAPILLARY
GLUCOSE-CAPILLARY: 113 mg/dL — AB (ref 70–99)
Glucose-Capillary: 102 mg/dL — ABNORMAL HIGH (ref 70–99)
Glucose-Capillary: 158 mg/dL — ABNORMAL HIGH (ref 70–99)
Glucose-Capillary: 94 mg/dL (ref 70–99)

## 2013-04-04 LAB — CULTURE, RESPIRATORY W GRAM STAIN

## 2013-04-04 LAB — BASIC METABOLIC PANEL
BUN: 13 mg/dL (ref 6–23)
BUN: 12 mg/dL (ref 6–23)
CHLORIDE: 117 meq/L — AB (ref 96–112)
CO2: 26 mEq/L (ref 19–32)
CO2: 25 mEq/L (ref 19–32)
CREATININE: 0.83 mg/dL (ref 0.50–1.35)
CREATININE: 0.75 mg/dL (ref 0.50–1.35)
Calcium: 8.3 mg/dL — ABNORMAL LOW (ref 8.4–10.5)
Calcium: 7.6 mg/dL — ABNORMAL LOW (ref 8.4–10.5)
Chloride: 112 mEq/L (ref 96–112)
GFR calc Af Amer: >90 mL/min (ref >90)
GFR calc non Af Amer: 86 mL/min — ABNORMAL LOW (ref >90)
GFR, EST NON AFRICAN AMERICAN: 83 mL/min — AB (ref >90)
GLUCOSE: 120 mg/dL — AB (ref 70–99)
GLUCOSE: 115 mg/dL — AB (ref 70–99)
Potassium: 2.8 mEq/L — CL (ref 3.7–5.3)
Potassium: 2.7 mEq/L — CL (ref 3.7–5.3)
Sodium: 150 mEq/L — ABNORMAL HIGH (ref 137–147)
Sodium: 154 mEq/L — ABNORMAL HIGH (ref 137–147)

## 2013-04-04 MED ORDER — POTASSIUM CHLORIDE 10 MEQ/100ML IV SOLN
10.0000 meq | INTRAVENOUS | Status: AC
  Administered 2013-04-04 (×4): 10 meq via INTRAVENOUS
  Filled 2013-04-04 (×4): qty 100

## 2013-04-04 MED ORDER — IOHEXOL 300 MG/ML  SOLN
25.0000 mL | INTRAMUSCULAR | Status: AC
  Administered 2013-04-04 (×2): 25 mL via ORAL

## 2013-04-04 MED ORDER — IOHEXOL 300 MG/ML  SOLN
90.0000 mL | Freq: Once | INTRAMUSCULAR | Status: AC | PRN
  Administered 2013-04-04: 90 mL via INTRAVENOUS

## 2013-04-04 MED ORDER — DILTIAZEM HCL ER 180 MG PO CP24
360.0000 mg | ORAL_CAPSULE | Freq: Every day | ORAL | Status: DC
  Administered 2013-04-04: 360 mg via ORAL
  Filled 2013-04-04 (×2): qty 2

## 2013-04-04 MED ORDER — STARCH (THICKENING) PO POWD
ORAL | Status: DC | PRN
  Filled 2013-04-04: qty 227

## 2013-04-04 MED ORDER — RIVAROXABAN 20 MG PO TABS
20.0000 mg | ORAL_TABLET | Freq: Every day | ORAL | Status: DC
  Administered 2013-04-04 – 2013-04-15 (×12): 20 mg via ORAL
  Filled 2013-04-04 (×14): qty 1

## 2013-04-04 MED ORDER — PANTOPRAZOLE SODIUM 40 MG PO TBEC
40.0000 mg | DELAYED_RELEASE_TABLET | Freq: Every day | ORAL | Status: DC
  Administered 2013-04-05 – 2013-04-19 (×15): 40 mg via ORAL
  Filled 2013-04-04 (×18): qty 1

## 2013-04-04 NOTE — Progress Notes (Signed)
Speech Language Pathology Treatment: Dysphagia;Passy Muir Speaking valve  Patient Details Name: Tim Short MRN: [id number] DOB: 10/26/1934 Today's Date: 04/04/2013 Time: 1400-1420 SLP Time Calculation (min): 20 min  Assessment / Plan / Recommendation Clinical Impression  Diet precautions and strategies reinforced while pt consuming pudding thick contrast for test. Min verbal cues for hard cough and multiple swallows.He tolerated PMSV placement without any distress or change in vital signs. He was abel to orally expectoration secretions with valve in place.  Pt also instructed in independent removal of PMSV. Pt return demonstrated x3 with verbalization of precautions. Also discussed with RN. Pt may wear PMSV during all waking hours. He must wear it for PO intake. Will continue to follow for tolerance.    HPI HPI: Pt is 79 yo male with fairly [facility] for failed maize procedure and during that hospitalization pt developed volvulus and has required right colectomy (10/08/2012), subsequently developed multiple abscesses and enteric fistulas managed percutaneous drains. [facility], unable to wean of the vent and requiring trach placement 10/29/2012. He was transferred to [facility] in December 2014 and later to [facility] in late January 2015. He was on TNA and was doing fairly well initially, but developed more abdominal pain and on 2/23 [place] gallbladder removal (secondary to cholecystitis), resection for part of the colon for fistula, IVC filter placement. Sent back to [facility] on march 4th, 2015 and started on TPN. Diet advanced on March 9th, 2015 after pt passed swallow evaluation. [facility], after noticing progressive abdominal distension. In ED, CT abdomen with air- fluid level and findings consistent with early partial SBO, ileus. Surgery reports pt is ok for PO from their standpoint, SLP  ordered for swallow eval.    Pertinent Vitals NA  SLP Plan  Continue with current plan of care    Recommendations Diet recommendations: Dysphagia 1 (puree);Pudding-thick liquid Liquids provided via: Cup;Teaspoon Medication Administration: Crushed with puree Supervision: Patient able to self feed;Full supervision/cueing for compensatory strategies Compensations: Slow rate;Small sips/bites;Multiple dry swallows after each bite/sip;Hard cough after swallow Postural Changes and/or Swallow Maneuvers: Seated upright 90 degrees;Upright 30-60 min after meal              Oral Care Recommendations: Oral care BID Follow up Recommendations: Skilled Nursing facility Plan: Continue with current plan of care    GO    [facility] CenterBonnie Ellouise Mcwhirter, MA CCC-SLP [phone]  Claudine MoutonDeBlois, Brynlynn Walko Caroline 04/04/2013, 2:36 PM

## 2013-04-04 NOTE — Progress Notes (Signed)
Paged [facility] on call. Dr. Joseph ArtWoods and spoke with him regarding Pradaxa and Hr 130's Afib. Pt stating he is unable to swallow capsule of Pradaxa. He makes motion that they have been opening capsule to give to him in pudding.Pharmacy states that Pradaxa capsule may not be opened up. MD told me to hold the Pradaxa tonight and have them address this in the morning.  Hr has been in 130's even though I gave him 5mg  IV Lopressor at 2210. Orders to increase Lopressor 10mg  IV Will Continue to assess.

## 2013-04-04 NOTE — Progress Notes (Signed)
Lab called and reported K-2.8. Called Dr. Joseph ArtWoods with [facility]. He gave order to re-draw the potassium to make sure it was not hemolyzed. Order entered and IV team called to re-draw from Picc line.    0640-K-2.7 at this time. 4 runs of 10 meq KCL IV ordered and started through PICC line. Will continue to assess.

## 2013-04-04 NOTE — Progress Notes (Signed)
Subjective: tol diet, still some abd pain  Objective: Vital signs in last 24 hours: Temp:  [98.2 F (36.8 C)-98.4 F (36.9 C)] 98.4 F (36.9 C) (03/23 0627) Pulse Rate:  [85-135] 126 (03/23 0627) Resp:  [19-22] 20 (03/23 0627) BP: (110-129)/(70-84) 115/76 mmHg (03/23 0627) SpO2:  [88 %-99 %] 98 % (03/23 0627) FiO2 (%):  [28 %-35 %] 35 % (03/23 0627) Last BM Date: 04/03/13  Intake/Output from previous day: 03/22 0701 - 03/23 0700 In: 720 [P.O.:120; IV Piggyback:600] Out: 901 [Urine:900; Stool:1] Intake/Output this shift:    Neck: trach Resp: some rhonchi GI: soft, minimal RUQ TTP, midline and R lateral wounds clean with packing  Lab Results:   Recent Labs  04/03/13 0505 04/04/13 0412  WBC 14.0* 11.9*  HGB 9.8* 9.1*  HCT 32.2* 29.4*  PLT 198 191   BMET  Recent Labs  04/04/13 0412 04/04/13 0620  NA 150* 154*  K 2.8* 2.7*  CL 112 117*  CO2 26 25  GLUCOSE 120* 115*  BUN 13 12  CREATININE 0.83 0.75  CALCIUM 8.3* 7.6*   PT/INR No results found for this basename: LABPROT, INR,  in the last 72 hours ABG No results found for this basename: PHART, PCO2, PO2, HCO3,  in the last 72 hours  Studies/Results: Dg Chest Port 1 View  04/03/2013   CLINICAL DATA:  Central venous catheter placement  EXAM: PORTABLE CHEST - 1 VIEW  COMPARISON:  DG CHEST 1V PORT dated 04/01/2013  FINDINGS: Left upper extremity PICC is been placed. Tip is in the lower SVC. Tracheostomy tube and left atrial appendage clip stable. Left pleural effusion and basilar consolidation stable. Vascular congestion increased.  IMPRESSION: Left PICC placed with its tip at the lower SVC.  Increased vascular congestion.  Stable left basilar consolidation and left pleural effusion.   Electronically Signed   By: Maryclare Bean M.D.   On: 04/03/2013 09:04   Dg Abd Portable 1v  04/04/2013   CLINICAL DATA:  Evaluate barium  EXAM: PORTABLE ABDOMEN - 1 VIEW  COMPARISON:  03/30/2013  FINDINGS: Contrast material is noted  within the transverse colon and splenic flexure of the colon. Small bowel loop distention has improved. Slight prominence of left abdominal small bowel loops persists. No free air. No organomegaly. Prior cholecystectomy.  IMPRESSION: Improving small bowel distention. Small amount of oral contrast material within the transverse colon and splenic flexure of the colon.   Electronically Signed   By: Charlett Nose M.D.   On: 04/04/2013 10:18   Dg Swallowing Func-speech Pathology  04/02/2013   Breck Coons [place], CCC-SLP     04/02/2013  2:03 PM Objective Swallowing Evaluation: Modified Barium Swallowing Study   Patient Details  Name: Tim Short MRN: [id number] Date of Birth: 1934/11/06  Today's Date: 04/02/2013 Time: 1225-1250 SLP Time Calculation (min): 25 min  Past Medical History:  Past Medical History  Diagnosis Date  . Hypertension   . Hypercholesteremia   . Asthma   . Meniere disease   . Persistent atrial fibrillation     a. s/p multiple dccv's;  b. failed amio;  c. not felt to be AF  RFCA canddiate due to LA dil;  d. s/p failed hybrid ablation @  [facility] in 09/2012;  e. chronic pradaxa.  . Obesity   . Biatrial enlargement     LA size 5.3cm  . Obstructive sleep apnea     AHI 108/hr now on CPAP at 12cm H2O  . H/O hiatal hernia   .  GERD (gastroesophageal reflux disease)     "associated w/hiatal hernia" (07-01-2012)  . Peptic ulcer 1950's  . Migraines     "haven't had one for about 15 years" (Jul 01, 2012)  . Arthritis     "joints" (2012-07-01)  . Chronic lower back pain   . Nephrolithiasis ~ 1960's    "passed on their own" (01-Jul-2012)  . History of pneumonia 2002; 2006    "spent 8 days in isolation; Norovirus" (07/01/2012)  . Other and unspecified angina pectoris   . RLS (restless legs syndrome)   . Coronary artery disease     a. 05/2012 Cath/PCI: LM nl, LAD nl, LCX 37m (3.0x15 Integrity  BMS), PTCA of OM1 through stent struts (kissing balloon).  . Diabetes mellitus without complication     FAMILY STATES PATIENT IS NOT DIABETIC    Past Surgical History:  Past Surgical History  Procedure Laterality Date  . Wrist fracture surgery  1992    "repaired w/left hip bone graft" (01-Jul-2012)  . Total knee arthroplasty  08/19/1999  . Colonoscopy w/ polypectomy  2006  . Knee arthroscopy with meniscal repair Right 1974    "medial meniscus repaired" (07-01-12)  . Cardioversion  10/02/2011    Procedure: CARDIOVERSION;  Surgeon: Corky Crafts, MD;   Location: [facility] ENDOSCOPY;  Service: Cardiovascular;  Laterality:  N/A;  h/p in file drawer  . Cardioversion  11/07/2011    Procedure: CARDIOVERSION;  Surgeon: Corky Crafts, MD;   Location: [facility] ENDOSCOPY;  Service: Cardiovascular;  Laterality:  N/A;  h/p from 10/22 in file drawer/dl  . Cardiac catheterization  05/26/2012  . Coronary angioplasty with stent placement  07-01-2012    "1" (07/01/12)  . Cataract extraction w/ intraocular lens  implant, bilateral   2009  . Hemorrhoid surgery  ~ 2003  . Hiatal hernia repair  1983  . Nissen fundoplication  1983  . Ablation of dysrhythmic focus  SEPT/OCT 2014    ATRIAL FIB  . Inguinal hernia repair Bilateral 2000 and 2005    "one done at a time" (07/01/2012)  . Colon surgery  10/08/2012   . Tracheostomy  OCT 2014   HPI:  Pt is 79 yo male with fairly recent hospitalization at [facility] in  September 2014 for failed maize procedure and during that  hospitalization pt developed volvulus and has required right  colectomy (10/08/2012), subsequently 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 Pt was in  prolonged respiratory failure at [facility], unable to wean of the vent  and requiring trach placement 10/29/2012. He was transferred to  [facility] in December 2014 and later to [facility] in late January  2015. He was on TNA and was doing fairly well initially, but  developed more abdominal pain and on 2/23 taken to OR in [place]  for gallbladder removal (secondary to cholecystitis), resection  for part of the colon for fistula, IVC filter placement. Sent   back to [facility] on march 4th, 2015 and started on TPN. Diet  advanced on March 9th, 2015 after pt passed swallow evaluation.  He was brought to [facility] March 16th, after noticing progressive  abdominal distension. In ED, CT abdomen with air- fluid level and  findings consistent with early partial SBO, ileus. Surgery  reports pt is ok for PO from their standpoint, SLP ordered for  swallow eval.      Assessment / Plan / Recommendation Clinical Impression  Dysphagia Diagnosis: Moderate pharyngeal phase dysphagia;Severe  pharyngeal phase dysphagia Clinical impression: MBS completed with pt's PMSV  donned and  appears to be congruent with study performed at [facility].  He  demonstrated moderate-severe motor based pharyngeal dyspahgia as  evidenced by reduced tongue base retraction, decreased laryngeal  elevation and decreased pharyngeal contraction.  Impairments led  to mild pharyngeal residue in vallecule/pyriform sinuses and  posterior pharyngeal wall and laryngeal vestibule consistently  silent penetration to vocal cords.  Verbal cues for hard cough  briefly cleared vestibule with subsequent penetration from  residue.  SLP recommends Dys 1 (puree) only, no liquids (pudding  thick liquid) with speaking valve donned, two swallows,  volitional coughs, pills crushed in applesauce and full  supervision/assist.       Treatment Recommendation  Therapy as outlined in treatment plan below    Diet Recommendation Dysphagia 1 (Puree);Pudding-thick liquid   Medication Administration: Crushed with puree Supervision: Patient able to self feed;Full supervision/cueing  for compensatory strategies Compensations: Slow rate;Small sips/bites;Multiple dry swallows  after each bite/sip;Hard cough after swallow Postural Changes and/or Swallow Maneuvers: Seated upright 90  degrees;Upright 30-60 min after meal, wear speaking valve during  all po's    Other  Recommendations Oral Care Recommendations: Oral care BID Other Recommendations: Order  thickener from pharmacy   Follow Up Recommendations  Skilled Nursing facility    Frequency and Duration min 2x/week  2 weeks   Pertinent Vitals/Pain WDL            Reason for Referral Objectively evaluate swallowing function   Oral Phase Oral Preparation/Oral Phase Oral Phase: WFL (WFL for textures assessed)   Pharyngeal Phase Pharyngeal Phase Pharyngeal Phase: Impaired Pharyngeal - Honey Pharyngeal - Honey Teaspoon: Reduced pharyngeal  peristalsis;Pharyngeal residue - valleculae;Reduced tongue base  retraction;Penetration/Aspiration during swallow;Pharyngeal  residue - pyriform sinuses;Reduced laryngeal elevation Penetration/Aspiration details (honey teaspoon): Material enters  airway, CONTACTS cords and not ejected out Pharyngeal - Honey Cup: Penetration/Aspiration during  swallow;Pharyngeal residue - valleculae;Pharyngeal residue -  pyriform sinuses;Reduced tongue base retraction;Reduced laryngeal  elevation Penetration/Aspiration details (honey cup): Material enters  airway, remains ABOVE vocal cords then ejected out Pharyngeal - Solids Pharyngeal - Puree: Pharyngeal residue - valleculae;Reduced  tongue base retraction;Pharyngeal residue - pyriform  sinuses;Reduced laryngeal elevation  Cervical Esophageal Phase        Cervical Esophageal Phase Cervical Esophageal Phase: [facility]         Darrow BussingLisa Willis Litaker M.Ed CCC-SLP Pager [phone]  04/02/2013    Anti-infectives: Anti-infectives   Start     Dose/Rate Route Frequency Ordered Stop   04/01/13 1230  vancomycin (VANCOCIN) 1,500 mg in sodium chloride 0.9 % 500 mL IVPB     1,500 mg 250 mL/hr over 120 Minutes Intravenous Every 24 hours 04/01/13 1150     04/01/13 1030  ceFEPIme (MAXIPIME) 1 g in dextrose 5 % 50 mL IVPB     1 g 100 mL/hr over 30 Minutes Intravenous Every 8 hours 04/01/13 1011 04/09/13 1029   03/29/13 1000  vancomycin (VANCOCIN) 1,500 mg in sodium chloride 0.9 % 500 mL IVPB  Status:  Discontinued     1,500 mg 250 mL/hr over 120 Minutes  Intravenous Daily 03/29/13 0245 03/29/13 1117   03/29/13 0600  piperacillin-tazobactam (ZOSYN) IVPB 3.375 g  Status:  Discontinued     3.375 g 12.5 mL/hr over 240 Minutes Intravenous 3 times per day 03/29/13 0301 03/29/13 1117   03/29/13 0245  piperacillin-tazobactam (ZOSYN) injection 3.375 g  Status:  Discontinued     3.375 g Intramuscular Every 6 hours 03/29/13 0245 03/29/13 0300      Assessment/Plan:  S/P multiple abdominal procedures at outside hospital Leukocytosis improving - CT ordered yesterday still not done FEN - abd x-ray shows less SB distention and there in contrast in colon, dehydration - per primary Dressing changes   LOS: 7 days    Estie Sproule E 04/04/2013

## 2013-04-04 NOTE — Discharge Instructions (Signed)

## 2013-04-04 NOTE — Progress Notes (Signed)
Patient ID: Tim Short Boven, male   DOB: 05/18/1934, 79 y.o.   MRN: [id number]  [facility] PROGRESS NOTE  Tim Short Kintzel WUJ:[id number] DOB: 03/27/1934 DOA: 03/28/2013 PCP:  Duane Lopeoss, Alan, MD  Brief narrative:  Pt is 79 yo male with fairly recent hospitalization at [facility] in September 2014 for failed maize procedure and during that hospitalization pt developed volvulus and has required right colectomy (10/08/2012), subsequently developed multiple abscesses and enteric fistulas managed percutaneous drains. Pt was in prolonged [facility] failure at [facility], unable to wean of the vent and requiring trach placement 10/29/2012. He was transferred to [facility] in December 2014 and later to [facility] in late January 2015. He was on TNA and was doing fairly well initially, but developed more abdominal pain and on 2/23 taken to OR in [place] for gallbladder removal (secondary to cholecystitis), resection for part of the colon for fistula, IVC filter placement. Sent back to [facility] on march 4th, 2015 and started on TPN. Diet advanced on March 9th, 2015 after pt passed swallow evaluation. [facility], after noticing progressive abdominal distension. In ED, CT abdomen with air- fluid level and findings consistent with early partial SBO, ileus. Surgery consulted and [facility] asked to admit for further evaluation.   Principal Problem:  Abdominal pain  - secondary to ileus, partial SBO  - appreciate surgery input  - abd XRAY with improving SBO, pt tolerating pure pudding thick diet  - continue supportive care with analgesia, antiemetics as needed for symptom control  Active Problems:  Atrial fibrillation  - rate in 120's - 130's  - Cardizem dose increased from 240 --> 360 mg PO QD, Pradaxa changed to Xarelto (3/23) - appreciate cardiology input  - will need cardioversion once medically more stable  Leukocytosis  - likely secondary to ileus, ? HCAP as suggestive per CXR 3/20  - CT abd/pelvis ordered to  make sure no need for drainage, ordered again for today - WBC is trending down  ? HCAP  - continue vancomycin and Maxipime day #4/7 to cover for HCAP  - WBC trending down  Pulmonary vascular congestion  - bilateral rhonchi and trace bilateral pitting edema  - IVF stopped 3/20 due to pulmonary vascular congestion  - monitor I's/O's, daily weights (203 lbs this AM)  Hypokalemia  - will supplement via IV route and repeat BMP in AM  Anemia of chronic disease  - Hg stable over the past 24 hours  - no signs of active bleeding  Essential hypertension  - slightly on soft side this AM  - close monitoring  Tracheostomy status  - suctioning as needed  Hypernatremia  - secondary to pre renal etiology, Na trending down slowly  Moderate malnutrition  - secondary to acute on chronic illness as outlined above  - per SLP, pudding thick liquids allowed  - pt tolerating well   Consultants:  Surgery  Cardiology  Procedures/Studies:  Ct Abdomen Pelvis W Contrast 03/28/2013 Findings consistent with persistent phlegmon in the liver. Interim removal of right upper quadrant drainage catheter. Colonic distention with air-fluid levels. Slight progression of soft tissue fluid in the anterior abdomen in the region the patient's scarring. 13 mm low-density lesion left kidney, not definite simple cyst.  Dg Abd 2 Views 03/30/2013 Mildly dilated loops of left upper quadrant small bowel with scattered internal air-fluid levels, similar to the prior exam, with abnormal but nonspecific pattern which could be due to could ileus or partial SBO.  Dg Abd 2 View  03/29/2013 Nonspecific bowel gas pattern, as above, suggestive of partial small bowel obstruction. No pneumoperitoneum.  Dg Abd Acute W/chest 03/28/2013 Persistent small bowel dilatation compatible with either partial SBO or ileus. No change in aeration to the lungs compared with previous exam. Antibiotics:  Vancomycin 3/20 -->  Maxipime 3/20 -->  Code Status: Full   Family Communication: Son Onalee Hua over the phone  Disposition Plan: Remains inpatient   HPI/Subjective: No events overnight.   Objective: Filed Vitals:   04/04/13 0245 04/04/13 0353 04/04/13 0627 04/04/13 1100  BP:   115/76 106/69  Pulse: 85 129 126 129  Temp:   98.4 F (36.9 C) 98.1 F (36.7 C)  TempSrc:   Oral Oral  Resp:  22 20 20   Height:      Weight:      SpO2:  96% 98% 99%    Intake/Output Summary (Last 24 hours) at 04/04/13 1749 Last data filed at 04/04/13 0018  Gross per 24 hour  Intake    170 ml  Output    901 ml  Net   -731 ml    Exam:   General:  Pt is alert, chronically ill and non verbal as he has trach, trach in place   Cardiovascular: irregular rate and rhythm, no rubs, no gallops  Respiratory: Bilateral rhonchi mostly at bases   Abdomen: Soft, non tender, non distended, bowel sounds present  Extremities: No edema, pulses DP and PT palpable bilaterally  Data Reviewed: Basic Metabolic Panel:  Recent Labs Lab 03/29/13 0446  04/01/13 0406 04/02/13 0700 04/03/13 0505 04/04/13 0412 04/04/13 0620  NA 157*  < > 148* 149* 148* 150* 154*  K 3.3*  < > 4.9 3.2* 3.3* 2.8* 2.7*  CL 121*  < > 111 111 110 112 117*  CO2 23  < > 25 27 25 26 25   GLUCOSE 154*  < > 126* 116* 105* 120* 115*  BUN 20  < > 12 12 13 13 12   CREATININE 1.05  < > 0.88 0.93 0.92 0.83 0.75  CALCIUM 8.4  < > 8.3* 8.3* 8.4 8.3* 7.6*  MG 2.2  --   --   --   --   --   --   < > = values in this interval not displayed. Liver Function Tests:  Recent Labs Lab 03/28/13 1831  AST 26  ALT 30  ALKPHOS 67  BILITOT 0.4  PROT 5.6*  ALBUMIN 2.3*    Recent Labs Lab 03/28/13 1831  LIPASE 30   CBC:  Recent Labs Lab 03/28/13 1831  03/31/13 0555 04/01/13 0406 04/02/13 0700 04/03/13 0505 04/04/13 0412  WBC 15.8*  < > 11.3* 15.0* 14.2* 14.0* 11.9*  NEUTROABS 13.5*  --   --   --   --   --   --   HGB 9.3*  < > 8.8* 10.4* 9.3* 9.8* 9.1*  HCT 30.1*  < > 28.8* 34.3* 30.2* 32.2*  29.4*  MCV 95.3  < > 96.3 96.6 96.5 96.1 95.8  PLT 265  < > 239 160 205 198 191  < > = values in this interval not displayed. Cardiac Enzymes: No results found for this basename: CKTOTAL, CKMB, CKMBINDEX, TROPONINI,  in the last 168 hours BNP: No components found with this basename: POCBNP,  CBG:  Recent Labs Lab 04/03/13 1633 04/03/13 2122 04/04/13 0655 04/04/13 1057 04/04/13 1617  GLUCAP 110* 113* 102* 113* 158*    Recent Results (from the past 240 hour(s))  MRSA PCR SCREENING  Status: None   Collection Time    03/29/13  3:00 AM      Result Value Ref Range Status   MRSA by PCR NEGATIVE  NEGATIVE Final   Comment:            The GeneXpert MRSA Assay (FDA     approved for NASAL specimens     only), is one component of a     comprehensive MRSA colonization     surveillance program. It is not     intended to diagnose MRSA     infection nor to guide or     monitor treatment for     MRSA infections.  URINE CULTURE     Status: None   Collection Time    03/29/13  6:50 AM      Result Value Ref Range Status   Specimen Description URINE, RANDOM   Final   Special Requests NONE   Final   Culture  Setup Time     Final   Value: 03/29/2013 12:43     Performed at [facility] Count     Final   Value: NO GROWTH     Performed at [facility]   Culture     Final   Value: NO GROWTH     Performed at [facility]   Report Status 03/30/2013 FINAL   Final  CULTURE, BLOOD (ROUTINE X 2)     Status: None   Collection Time    04/01/13 10:55 AM      Result Value Ref Range Status   Specimen Description BLOOD LEFT HAND   Final   Special Requests BOTTLES DRAWN AEROBIC AND ANAEROBIC 5CC   Final   Culture  Setup Time     Final   Value: 04/01/2013 14:39     Performed at [facility]   Culture     Final   Value:        BLOOD CULTURE RECEIVED NO GROWTH TO DATE CULTURE WILL BE HELD FOR 5 DAYS BEFORE ISSUING A FINAL NEGATIVE REPORT     Performed at  [facility]   Report Status PENDING   Incomplete  CULTURE, BLOOD (ROUTINE X 2)     Status: None   Collection Time    04/01/13 11:05 AM      Result Value Ref Range Status   Specimen Description BLOOD RIGHT HAND   Final   Special Requests BOTTLES DRAWN AEROBIC AND ANAEROBIC 5CC   Final   Culture  Setup Time     Final   Value: 04/01/2013 14:39     Performed at [facility]   Culture     Final   Value:        BLOOD CULTURE RECEIVED NO GROWTH TO DATE CULTURE WILL BE HELD FOR 5 DAYS BEFORE ISSUING A FINAL NEGATIVE REPORT     Performed at [facility]   Report Status PENDING   Incomplete  CULTURE, RESPIRATORY (NON-EXPECTORATED)     Status: None   Collection Time    04/01/13  3:25 PM      Result Value Ref Range Status   Specimen Description TRACHEAL ASPIRATE   Final   Special Requests NONE   Final   Gram Stain     Final   Value: FEW WBC PRESENT,BOTH PMN AND MONONUCLEAR     NO SQUAMOUS EPITHELIAL CELLS SEEN     FEW GRAM POSITIVE RODS     RARE  YEAST     Performed at [facility]   Culture     Final   Value: FEW KLEBSIELLA PNEUMONIAE     Performed at [facility]   Report Status 04/03/2013 FINAL   Final   Organism ID, Bacteria KLEBSIELLA PNEUMONIAE   Final  CULTURE, RESPIRATORY (NON-EXPECTORATED)     Status: None   Collection Time    04/02/13  8:34 AM      Result Value Ref Range Status   Specimen Description TRACHEAL ASPIRATE   Final   Special Requests NONE   Final   Gram Stain     Final   Value: ABUNDANT WBC PRESENT, PREDOMINANTLY PMN     MODERATE SQUAMOUS EPITHELIAL CELLS PRESENT     FEW GRAM POSITIVE COCCI IN PAIRS     IN CLUSTERS IN CHAINS FEW GRAM NEGATIVE COCCI     Performed at [facility]   Culture     Final   Value: Non-Pathogenic Oropharyngeal-type Flora Isolated.     Performed at [facility]   Report Status 04/04/2013 FINAL   Final     Scheduled Meds: . amiodarone  400 mg Oral BID  . atorvastatin  40 mg Oral  Daily  . ceFEPime (MAXIPIME) IV  1 g Intravenous Q8H  . chlorhexidine  15 mL Mouth/Throat BID  . diltiazem  360 mg Oral Daily  . insulin aspart  0-9 Units Subcutaneous TID WC  . [START ON 04/05/2013] pantoprazole  40 mg Oral Daily  . predniSONE  5 mg Oral Q breakfast  . rivaroxaban  20 mg Oral Q supper  . roflumilast  500 mcg Oral Daily  . sodium chloride  10-40 mL Intracatheter Q12H  . sodium chloride  3 mL Intravenous Q12H  . vancomycin  1,500 mg Intravenous Q24H   Continuous Infusions:  Debbora Presto, MD  [facility] Pager [phone]  If 7PM-7AM, please contact night-coverage [email or web address] Password [facility] 04/04/2013, 5:49 PM   LOS: 7 days

## 2013-04-04 NOTE — Progress Notes (Signed)
    Subjective:  Denies CP or dyspnea; complains of palpitations; mild abdominal pain (improved).  He has had some difficulty swallowing his pradaxa due to the size of the pill.   Objective:  Filed Vitals:   04/04/13 0200 04/04/13 0245 04/04/13 0353 04/04/13 0627  BP: 110/70   115/76  Pulse: 128 85 129 126  Temp:    98.4 F (36.9 C)  TempSrc:    Oral  Resp:   22 20  Height:      Weight:      SpO2:   96% 98%    Intake/Output from previous day:  Intake/Output Summary (Last 24 hours) at 04/04/13 0813 Last data filed at 04/04/13 0018  Gross per 24 hour  Intake    720 ml  Output    901 ml  Net   -181 ml    Physical Exam: Physical exam: Well-developed chronically ill appearing in no acute distress.  Skin is warm and dry.  OP) clear Neck is supple.  Chest with mildly diminished BS bases Cardiovascular exam is tachycardic and irregular Abdominal exam s/p abdominal surgery Extremities show no edema. neuro grossly intact    Lab Results: Basic Metabolic Panel:  Recent Labs  56/21/3003/23/15 0412 04/04/13 0620  NA 150* 154*  K 2.8* 2.7*  CL 112 117*  CO2 26 25  GLUCOSE 120* 115*  BUN 13 12  CREATININE 0.83 0.75  CALCIUM 8.3* 7.6*   CBC:  Recent Labs  04/03/13 0505 04/04/13 0412  WBC 14.0* 11.9*  HGB 9.8* 9.1*  HCT 32.2* 29.4*  MCV 96.1 95.8  PLT 198 191     Assessment/Plan:  1 atrial flutter-patient remains in atrial flutter. [facility] with [facility]. Amiodarone has been started by Dr Graciela HusbandsKlein.  He has trouble swallowing pradaxa due to the size of the pill.  Given his multiple GI issues, I think that we should switch him from pradaxa to xarelto which should be better absorbed.  He will start xarelto 20mg  daily today.  Increase diltiazem to 360mg  daily.  Continue amiodarone.  We will consider cardioversion once his other medically issues are more stable.  This could even be done electively as an outpatient if  necessary. 2 Coronary artery disease-continue statin. 3 ileus-management per general surgery. 4 hypertension-continue present medications. 5.hypokalemia- primary team to manage  Hillis RangeJames Danaria Larsen MD 04/04/2013, 8:13 AM

## 2013-04-04 NOTE — Progress Notes (Signed)
ANTIBIOTIC CONSULT NOTE - follow up Pharmacy Consult for Vancomycin/cefepime Indication: pneumonia  Allergies  Allergen Reactions  . Corticosteroids     Inhaled Corticosteroids--Hoarseness, dry mouth  . Furosemide Other (See Comments)    Ototoxicity     Patient Measurements: Height: 5' 8.9" (175 cm) Weight: 203 lb 14.8 oz (92.5 kg) IBW/kg (Calculated) : 70.47   Vital Signs: Temp: 98.1 F (36.7 C) (03/23 1100) Temp src: Oral (03/23 1100) BP: 106/69 mmHg (03/23 1100) Pulse Rate: 129 (03/23 1100) Intake/Output from previous day: 03/22 0701 - 03/23 0700 In: 720 [P.O.:120; IV Piggyback:600] Out: 901 [Urine:900; Stool:1] Intake/Output from this shift:    Labs:  Recent Labs  04/02/13 0700 04/03/13 0505 04/04/13 0412 04/04/13 0620  WBC 14.2* 14.0* 11.9*  --   HGB 9.3* 9.8* 9.1*  --   PLT 205 198 191  --   CREATININE 0.93 0.92 0.83 0.75   Estimated Creatinine Clearance: 86.7 ml/min (by C-G formula based on Cr of 0.75). No results found for this basename: VANCOTROUGH, Leodis Binet, VANCORANDOM, GENTTROUGH, GENTPEAK, GENTRANDOM, TOBRATROUGH, TOBRAPEAK, TOBRARND, AMIKACINPEAK, AMIKACINTROU, AMIKACIN,  in the last 72 hours   Microbiology: Recent Results (from the past 720 hour(s))  MRSA PCR SCREENING     Status: None   Collection Time    03/29/13  3:00 AM      Result Value Ref Range Status   MRSA by PCR NEGATIVE  NEGATIVE Final   Comment:            The GeneXpert MRSA Assay (FDA     approved for NASAL specimens     only), is one component of a     comprehensive MRSA colonization     surveillance program. It is not     intended to diagnose MRSA     infection nor to guide or     monitor treatment for     MRSA infections.  URINE CULTURE     Status: None   Collection Time    03/29/13  6:50 AM      Result Value Ref Range Status   Specimen Description URINE, RANDOM   Final   Special Requests NONE   Final   Culture  Setup Time     Final   Value: 03/29/2013 12:43   Performed at [facility] Count     Final   Value: NO GROWTH     Performed at [facility]   Culture     Final   Value: NO GROWTH     Performed at [facility]   Report Status 03/30/2013 FINAL   Final  CULTURE, BLOOD (ROUTINE X 2)     Status: None   Collection Time    04/01/13 10:55 AM      Result Value Ref Range Status   Specimen Description BLOOD LEFT HAND   Final   Special Requests BOTTLES DRAWN AEROBIC AND ANAEROBIC 5CC   Final   Culture  Setup Time     Final   Value: 04/01/2013 14:39     Performed at [facility]   Culture     Final   Value:        BLOOD CULTURE RECEIVED NO GROWTH TO DATE CULTURE WILL BE HELD FOR 5 DAYS BEFORE ISSUING A FINAL NEGATIVE REPORT     Performed at [facility]   Report Status PENDING   Incomplete  CULTURE, BLOOD (ROUTINE X 2)     Status: None   Collection  Time    04/01/13 11:05 AM      Result Value Ref Range Status   Specimen Description BLOOD RIGHT HAND   Final   Special Requests BOTTLES DRAWN AEROBIC AND ANAEROBIC 5CC   Final   Culture  Setup Time     Final   Value: 04/01/2013 14:39     Performed at [facility]   Culture     Final   Value:        BLOOD CULTURE RECEIVED NO GROWTH TO DATE CULTURE WILL BE HELD FOR 5 DAYS BEFORE ISSUING A FINAL NEGATIVE REPORT     Performed at [facility]   Report Status PENDING   Incomplete  CULTURE, RESPIRATORY (NON-EXPECTORATED)     Status: None   Collection Time    04/01/13  3:25 PM      Result Value Ref Range Status   Specimen Description TRACHEAL ASPIRATE   Final   Special Requests NONE   Final   Gram Stain     Final   Value: FEW WBC PRESENT,BOTH PMN AND MONONUCLEAR     NO SQUAMOUS EPITHELIAL CELLS SEEN     FEW GRAM POSITIVE RODS     RARE YEAST     Performed at [facility]   Culture     Final   Value: FEW KLEBSIELLA PNEUMONIAE     Performed at [facility]   Report Status 04/03/2013 FINAL   Final   Organism ID,  Bacteria KLEBSIELLA PNEUMONIAE   Final  CULTURE, RESPIRATORY (NON-EXPECTORATED)     Status: None   Collection Time    04/02/13  8:34 AM      Result Value Ref Range Status   Specimen Description TRACHEAL ASPIRATE   Final   Special Requests NONE   Final   Gram Stain     Final   Value: ABUNDANT WBC PRESENT, PREDOMINANTLY PMN     MODERATE SQUAMOUS EPITHELIAL CELLS PRESENT     FEW GRAM POSITIVE COCCI IN PAIRS     IN CLUSTERS IN CHAINS FEW GRAM NEGATIVE COCCI     Performed at [facility]   Culture     Final   Value: Non-Pathogenic Oropharyngeal-type Flora Isolated.     Performed at [facility]   Report Status 04/04/2013 FINAL   Final    Medical History: Past Medical History  Diagnosis Date  . Hypertension   . Hypercholesteremia   . Asthma   . Meniere disease   . Persistent atrial fibrillation     a. s/p multiple dccv's;  b. failed amio;  c. not felt to be AF RFCA canddiate due to LA dil;  d. s/p failed hybrid ablation @ [facility] in 09/2012;  e. chronic pradaxa.  . Obesity   . Biatrial enlargement     LA size 5.3cm  . Obstructive sleep apnea     AHI 108/hr now on CPAP at 12cm H2O  . H/O hiatal hernia   . GERD (gastroesophageal reflux disease)     "associated w/hiatal hernia" (2012/07/03)  . Peptic ulcer 1950's  . Migraines     "haven't had one for about 15 years" (Jul 03, 2012)  . Arthritis     "joints" (2012-07-03)  . Chronic lower back pain   . Nephrolithiasis ~ 1960's    "passed on their own" (07-03-2012)  . History of pneumonia 2002; 2006    "spent 8 days in isolation; Norovirus" (July 03, 2012)  . Other and unspecified angina pectoris   . RLS (  restless legs syndrome)   . Coronary artery disease     a. 05/2012 Cath/PCI: LM nl, LAD nl, LCX 3257m (3.0x15 Integrity BMS), PTCA of OM1 through stent struts (kissing balloon).  . Diabetes mellitus without complication     FAMILY STATES PATIENT IS NOT DIABETIC    Assessment: 79yo man with pneumonia. Vancomycin/cefepime day #  4.  He had previously been receiving Vancomycin 1500mg  IV q24 at [facility] with last dose on 3/16 at 6AM and a random level here which was essentially a trough, though a bit early,  on 3/17 = 20.9.  3/20 trach asp with few klebsiella, sensitive to cefepime.   WBC down to 11.9, creat 0.75; afebrile. CT of abd ordered.  CXR 3/22 stable L basilar consolidation and L pleural effusion.   3/20  Trach asp (+)Klebsiella, s: cefepime 3/21  Trach asp (+) non path flora F 3/21  Blood x 2:  NGTD 3/21  Urine:  NTF   Goal of Therapy:  Vancomycin trough level 15-20 mcg/ml  Plan:  1-  Continue Vancomycin 1500mg  IV q24 2-  Vancomycin trough at steady state if vanco to continue > 7 days 3-  Monitor renal fxn and f/u any culture data 4-  Cefepime 1 gm IV q8h, dose is appropriate for renal fxn  Herby AbrahamMichelle T. Johathan Province, Pharm.D. [phone] 04/04/2013 3:42 PM

## 2013-04-05 DIAGNOSIS — D72829 Elevated white blood cell count, unspecified: Secondary | ICD-10-CM

## 2013-04-05 LAB — BASIC METABOLIC PANEL
BUN: 11 mg/dL (ref 6–23)
CALCIUM: 8.2 mg/dL — AB (ref 8.4–10.5)
CO2: 26 meq/L (ref 19–32)
CREATININE: 0.77 mg/dL (ref 0.50–1.35)
Chloride: 115 mEq/L — ABNORMAL HIGH (ref 96–112)
GFR calc non Af Amer: 85 mL/min — ABNORMAL LOW (ref >90)
Glucose, Bld: 109 mg/dL — ABNORMAL HIGH (ref 70–99)
Potassium: 2.9 mEq/L — CL (ref 3.7–5.3)
SODIUM: 152 meq/L — AB (ref 137–147)

## 2013-04-05 LAB — GLUCOSE, CAPILLARY
GLUCOSE-CAPILLARY: 102 mg/dL — AB (ref 70–99)
GLUCOSE-CAPILLARY: 123 mg/dL — AB (ref 70–99)
GLUCOSE-CAPILLARY: 90 mg/dL (ref 70–99)
Glucose-Capillary: 105 mg/dL — ABNORMAL HIGH (ref 70–99)

## 2013-04-05 LAB — CBC
HCT: 29.3 % — ABNORMAL LOW (ref 39.0–52.0)
Hemoglobin: 9.1 g/dL — ABNORMAL LOW (ref 13.0–17.0)
MCH: 30.1 pg (ref 26.0–34.0)
MCHC: 31.1 g/dL (ref 30.0–36.0)
MCV: 97 fL (ref 78.0–100.0)
PLATELETS: 162 10*3/uL (ref 150–400)
RBC: 3.02 MIL/uL — ABNORMAL LOW (ref 4.22–5.81)
RDW: 20.9 % — AB (ref 11.5–15.5)
WBC: 10.5 10*3/uL (ref 4.0–10.5)

## 2013-04-05 LAB — POTASSIUM: Potassium: 3.1 mEq/L — ABNORMAL LOW (ref 3.7–5.3)

## 2013-04-05 MED ORDER — LEVOFLOXACIN 750 MG PO TABS
750.0000 mg | ORAL_TABLET | Freq: Every day | ORAL | Status: DC
  Administered 2013-04-05 – 2013-04-08 (×4): 750 mg via ORAL
  Filled 2013-04-05 (×4): qty 1

## 2013-04-05 MED ORDER — DILTIAZEM HCL ER 240 MG PO CP24
240.0000 mg | ORAL_CAPSULE | Freq: Two times a day (BID) | ORAL | Status: DC
  Administered 2013-04-05 – 2013-04-08 (×7): 240 mg via ORAL
  Filled 2013-04-05 (×9): qty 1

## 2013-04-05 MED ORDER — POTASSIUM CHLORIDE 10 MEQ/100ML IV SOLN
10.0000 meq | INTRAVENOUS | Status: AC
  Administered 2013-04-05 (×4): 10 meq via INTRAVENOUS
  Filled 2013-04-05 (×4): qty 100

## 2013-04-05 NOTE — Progress Notes (Signed)
Late entry. Kirtland BouchardK. Schorr N.P. Was text page with potassium 2.9. Awaiting return call.

## 2013-04-05 NOTE — Progress Notes (Signed)
Subjective: Pt feels okay.  No N/V, tolerating D1 diet.  Having BM's and flatus.  Dressing changes going well.  Feeling stronger.    Objective: Vital signs in last 24 hours: Temp:  [98.1 F (36.7 C)-98.5 F (36.9 C)] 98.5 F (36.9 C) (03/24 0623) Pulse Rate:  [80-129] 94 (03/24 0623) Resp:  [15-20] 15 (03/24 0623) BP: (104-124)/(58-86) 106/58 mmHg (03/24 0623) SpO2:  [96 %-100 %] 98 % (03/24 0623) FiO2 (%):  [35 %] 35 % (03/24 0623) Weight:  [195 lb 15.8 oz (88.9 kg)] 195 lb 15.8 oz (88.9 kg) (03/24 0623) Last BM Date: 04/04/13  Intake/Output from previous day: 03/23 0701 - 03/24 0700 In: 50 [IV Piggyback:50] Out: 1000 [Urine:1000] Intake/Output this shift: Total I/O In: -  Out: 400 [Urine:400]  PE: Gen:  Alert, NAD, pleasant Abd: Soft, NT/ND, +BS, no HSM, midline wound nearly healed with 1.5cm round healing superficial lesion at the inferior aspect of the midline wound, RLQ/flank wound clean   Lab Results:   Recent Labs  04/04/13 0412 04/05/13 0503  WBC 11.9* 10.5  HGB 9.1* 9.1*  HCT 29.4* 29.3*  PLT 191 162   BMET  Recent Labs  04/04/13 0620 04/05/13 0503  NA 154* 152*  K 2.7* 2.9*  CL 117* 115*  CO2 25 26  GLUCOSE 115* 109*  BUN 12 11  CREATININE 0.75 0.77  CALCIUM 7.6* 8.2*   PT/INR No results found for this basename: LABPROT, INR,  in the last 72 hours CMP     Component Value Date/Time   NA 152* 04/05/2013 0503   K 2.9* 04/05/2013 0503   CL 115* 04/05/2013 0503   CO2 26 04/05/2013 0503   GLUCOSE 109* 04/05/2013 0503   BUN 11 04/05/2013 0503   CREATININE 0.77 04/05/2013 0503   CALCIUM 8.2* 04/05/2013 0503   PROT 5.6* 03/28/2013 1831   ALBUMIN 2.3* 03/28/2013 1831   AST 26 03/28/2013 1831   ALT 30 03/28/2013 1831   ALKPHOS 67 03/28/2013 1831   BILITOT 0.4 03/28/2013 1831   GFRNONAA 85* 04/05/2013 0503   GFRAA >90 04/05/2013 0503   Lipase     Component Value Date/Time   LIPASE 30 03/28/2013 1831       Studies/Results: Ct Abdomen Pelvis W  Contrast  04/04/2013   CLINICAL DATA:  Liver flight non.  Leukocytosis.  EXAM: CT ABDOMEN AND PELVIS WITH CONTRAST  TECHNIQUE: Multidetector CT imaging of the abdomen and pelvis was performed using the standard protocol following bolus administration of intravenous contrast.  CONTRAST:  90mL OMNIPAQUE IOHEXOL 300 MG/ML  SOLN  COMPARISON:  DG ABD PORTABLE 1V dated 04/04/2013; CT ABD/PELVIS W CM dated 03/28/2013; CT ABD-PELV W/O CM dated 03/11/2013; CT ABD/PELVIS W CM dated 01/30/2013  FINDINGS: Stable moderate left and small right pleural effusions with associated passive atelectasis. Mild cardiomegaly. Postoperative findings along the gastroesophageal junction.  Slightly reduce marginal definition of the infiltrative hypodense process involving the right hepatic lobe and portions of segment 4 of the liver.  The spleen, adrenal glands, and pancreas unremarkable. Small amount of fluid along the gallbladder fossa and along the inferior edge of the right hepatic lobe could, similar to prior. Gallbladder surgically absent.  There is contrast medium in the renal collecting systems on the initial portal venous phase images, likely due to inadvertent early injection.  Stable renal hypodensities. Infrarenal IVC filter. Continued fluid along the inferior margin of the laparotomy wound. Trace fluid in the lower omentum. Stable trace presacral edema.  Bilateral chronic pars  defects at L5 observed with 9 mm of anterolisthesis.  Orally administered contrast extends through to the rectum. Mild circumferential rectal wall thickening.  The patient seems to have had right hemicolectomy, with reanastomosis to the small bowel on image 39 of series 5. Outpouching from the proximal terminal margin of the remaining colon shown on images 32 through 21 of series 5, potentially some type of postoperative diverticulum or contained leak along the stable site.  IMPRESSION: 1. Similar distribution but reduced conspicuity of the peripheral  infiltrative hepatic hypodensity affecting the right hepatic lobe an adjacent portion of segment 4. Fatty infiltration is the most common cause for and infiltrative hypodensity of this type, although localized parenchymal inflammation is not entirely excluded. MRI could differentiate if clinically warranted. 2. Small amount of perihepatic ascites, similar to prior. 3. Hypodense renal lesions, similar to prior. 4. Patient appears to have had right hemicolectomy with small bowel reanastomosis. There is a collection of gas and contrast extending to the right of the proximal terminal margin of the remaining colon, potentially a small contained leak along the staple line. 5. Continued fluid along the inferior margin of the laparotomy wound, but without internal gas density currently.   Electronically Signed   By: Herbie BaltimoreWalt  Liebkemann M.D.   On: 04/04/2013 18:40   Dg Chest Port 1 View  04/03/2013   CLINICAL DATA:  Central venous catheter placement  EXAM: PORTABLE CHEST - 1 VIEW  COMPARISON:  DG CHEST 1V PORT dated 04/01/2013  FINDINGS: Left upper extremity PICC is been placed. Tip is in the lower SVC. Tracheostomy tube and left atrial appendage clip stable. Left pleural effusion and basilar consolidation stable. Vascular congestion increased.  IMPRESSION: Left PICC placed with its tip at the lower SVC.  Increased vascular congestion.  Stable left basilar consolidation and left pleural effusion.   Electronically Signed   By: Maryclare BeanArt  Hoss M.D.   On: 04/03/2013 09:04   Dg Abd Portable 1v  04/04/2013   CLINICAL DATA:  Evaluate barium  EXAM: PORTABLE ABDOMEN - 1 VIEW  COMPARISON:  03/30/2013  FINDINGS: Contrast material is noted within the transverse colon and splenic flexure of the colon. Small bowel loop distention has improved. Slight prominence of left abdominal small bowel loops persists. No free air. No organomegaly. Prior cholecystectomy.  IMPRESSION: Improving small bowel distention. Small amount of oral contrast  material within the transverse colon and splenic flexure of the colon.   Electronically Signed   By: Charlett NoseKevin  Dover M.D.   On: 04/04/2013 10:18    Anti-infectives: Anti-infectives   Start     Dose/Rate Route Frequency Ordered Stop   04/01/13 1230  vancomycin (VANCOCIN) 1,500 mg in sodium chloride 0.9 % 500 mL IVPB     1,500 mg 250 mL/hr over 120 Minutes Intravenous Every 24 hours 04/01/13 1150     04/01/13 1030  ceFEPIme (MAXIPIME) 1 g in dextrose 5 % 50 mL IVPB     1 g 100 mL/hr over 30 Minutes Intravenous Every 8 hours 04/01/13 1011 04/09/13 1029   03/29/13 1000  vancomycin (VANCOCIN) 1,500 mg in sodium chloride 0.9 % 500 mL IVPB  Status:  Discontinued     1,500 mg 250 mL/hr over 120 Minutes Intravenous Daily 03/29/13 0245 03/29/13 1117   03/29/13 0600  piperacillin-tazobactam (ZOSYN) IVPB 3.375 g  Status:  Discontinued     3.375 g 12.5 mL/hr over 240 Minutes Intravenous 3 times per day 03/29/13 0301 03/29/13 1117   03/29/13 0245  piperacillin-tazobactam (ZOSYN) injection  3.375 g  Status:  Discontinued     3.375 g Intramuscular Every 6 hours 03/29/13 0245 03/29/13 0300       Assessment/Plan 79 y/o with previous 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 Pt had recent fistula takedown and cholecystectomy performed at [facility].  Was recently at [facility]. S/P multiple abdominal procedures at outside hospital - WD dressing changes to RLQ wound, dry dressing to midline wound Ileus - resolved Abdominal pain - resolved Hypokalemia - primary is supplementing Leukocytosis resolved (10.5)- CT shows potentially a small contained leak along staple line, fluid at inferior laparotomy wound FEN - abd x-ray shows less SB distention and there in contrast in colon, dehydration - per primary, D1 diet per SLP VTE - SCD's and not currently on pharm DVT proph       LOS: 8 days    DORT, [facility] 04/05/2013, 8:17 AM Pager: [phone]

## 2013-04-05 NOTE — Progress Notes (Signed)
    Subjective:  Denies CP or dyspnea; complains of palpitations; mild abdominal pain (improved).    Repeat abdominal CT yesterday, pt tolerating pudding thick diet.   Objective:  Filed Vitals:   04/04/13 2142 04/05/13 0025 04/05/13 0323 04/05/13 0623  BP:    106/58  Pulse: 105 101 122 94  Temp:    98.5 F (36.9 C)  TempSrc:    Oral  Resp: 20 18 18 15   Height:      Weight:    195 lb 15.8 oz (88.9 kg)  SpO2: 100% 100% 96% 98%    Intake/Output from previous day:  Intake/Output Summary (Last 24 hours) at 04/05/13 [id number] Last data filed at 04/05/13 0241  Gross per 24 hour  Intake     50 ml  Output   1000 ml  Net   -950 ml    Physical Exam: Physical exam: Well-developed chronically ill appearing in no acute distress.  Skin is warm and dry.  OP) clear Neck is supple.  Chest with mildly diminished BS bases Cardiovascular exam is tachycardic and irregular Abdominal exam s/p abdominal surgery Extremities show no edema. neuro grossly intact    Lab Results: Basic Metabolic Panel:  Recent Labs  11/91/4703/23/15 0412 04/04/13 0620  NA 150* 154*  K 2.8* 2.7*  CL 112 117*  CO2 26 25  GLUCOSE 120* 115*  BUN 13 12  CREATININE 0.83 0.75  CALCIUM 8.3* 7.6*   CBC:  Recent Labs  04/04/13 0412 04/05/13 0503  WBC 11.9* 10.5  HGB 9.1* 9.1*  HCT 29.4* 29.3*  MCV 95.8 97.0  PLT 191 162     Assessment/Plan:  1 atrial flutter- patient remains in atypical atrial flutter. He has [facility] with multiple complications. Amiodarone has been started by Dr Graciela HusbandsKlein.  I have switched pradaxa to xarelto for better absorption and smaller pill size (he was having trouble swallowing pradaxa).  Increase diltiazem to 240mg  BID  Continue amiodarone.  We will consider cardioversion once his other medically issues are more stable.  This could even be done electively as an outpatient if necessary. 2 Coronary artery disease-continue statin. 3 ileus-management per general  surgery. 4 hypertension-continue present medications. 5.hypokalemia- primary team to manage  Hillis RangeJames Tasha Jindra mD

## 2013-04-05 NOTE — Evaluation (Signed)
Physical Therapy Evaluation Patient Details Name: Tim Short MRN: [id number] DOB: April 16, 1934 Today's Date: 04/05/2013   History of Present Illness  79 yo male with recent complications at [facility] after an ablation for afib in oct 2014 he developed a volvulus after the ablation was intubated/trachedm required multiple abd surgeries then developed fistula details unknown.  Has been on tpn, with ngt on/off since. Is at [facility] sent here for complaints of abd pain.  Pt was taken off tpn and feeding tube was removed last week.  He has been eating by mouth and doing well.  But last 4 days with worsening abd pain, no n/v/d.  No fevers (however is was started on vanc and zosyn for unknown reasons).  Pt na level is elevated, and appears dehydrated.  Pt appears comfortable.  Clinical Impression  Pt admitted with abdominal pain, sbo, ileus.  Pt currently limited functionally due to the problems listed. ( See problems list.)   Pt will benefit from PT to maximize function and safety in order to get ready for next venue listed below.     Follow Up Recommendations SNF    Equipment Recommendations  Other (comment) (TBA at final venue before home)    Recommendations for Other Services       Precautions / Restrictions Precautions Precautions: Fall Restrictions Other Position/Activity Restrictions: Got dizzy and nauseated sitting up EOB      Mobility  Bed Mobility Overal bed mobility: Needs Assistance Bed Mobility: Supine to Sit;Sit to Supine     Supine to sit: Mod assist Sit to supine: Mod assist   General bed mobility comments: cues for sequencing , significant truncal assist  Transfers Overall transfer level: Needs assistance   Transfers: Sit to/from Stand Sit to Stand: Max assist         General transfer comment: 2 person transfer for safety.  significant forward and lifting assist  Ambulation/Gait                Stairs            Wheelchair Mobility    Modified  Rankin (Stroke Patients Only)       Balance Overall balance assessment: Needs assistance Sitting-balance support: Feet supported;No upper extremity supported Sitting balance-[name] Scale: Poor Sitting balance - Comments: tendency to fall backward with slope of the mattress                             Pertinent Vitals/Pain  up to 98% on 35% TC at rest or exertion.  EHR up to 137 bpm and sustained in 120/130's     Home Living Family/patient expects to be discharged to:: Unsure Living Arrangements: Alone                    Prior Function Level of Independence: Independent with assistive device(s)               Hand Dominance        Extremity/Trunk Assessment   Upper Extremity Assessment: Generalized weakness           Lower Extremity Assessment: Generalized weakness;RLE deficits/detail RLE Deficits / Details: Knee flexion limit to approx 90* flexion       Communication   Communication: No difficulties;Passy-Muir valve  Cognition Arousal/Alertness: Awake/alert Behavior During Therapy: WFL for tasks assessed/performed Overall Cognitive Status: Within Functional Limits for tasks assessed  General Comments      Exercises        Assessment/Plan    PT Assessment Patient needs continued PT services  PT Diagnosis Generalized weakness   PT Problem List Decreased strength;Decreased activity tolerance;Decreased balance;Decreased mobility;Decreased knowledge of use of DME;Decreased knowledge of precautions;Cardiopulmonary status limiting activity  PT Treatment Interventions Gait training;Functional mobility training;Therapeutic activities;Balance training;Patient/family education   PT Goals (Current goals can be found in the Care Plan section) Acute Rehab PT Goals Patient Stated Goal: To get back home PT Goal Formulation: With patient Time For Goal Achievement: 04/19/13 Potential to Achieve Goals: Good     Frequency Min 3X/week   Barriers to discharge Decreased caregiver support      End of Session Equipment Utilized During Treatment: Oxygen Activity Tolerance: Patient tolerated treatment well;Other (comment) (limited by dizziness like passing out sitting EOB) Patient left: in bed;with call bell/phone within reach         Time: 1246-1316 PT Time Calculation (min): 30 min   Charges:   PT Evaluation $Initial PT Evaluation Tier I: 1 Procedure PT Treatments $Therapeutic Activity: 23-37 mins   PT G Codes:          Tim Short, Eliseo GumKenneth V 04/05/2013, 2:26 PM 04/05/2013  [place] BingKen Sueko Dimichele, PT [phone]  (pager)

## 2013-04-05 NOTE — Progress Notes (Signed)
Per day shift RN, follow up needed regarding K level after receiving IV K runs today.  MD on call notified, potassium level to be drawn.  Will continue to monitor pt closely.  Call bell within reach.

## 2013-04-05 NOTE — Progress Notes (Signed)
Pt received 4 runs of K this am per MD orders; notified MD to see if labs should be ordered to check K level; MD said they would place orders for 2 more runs of K today and recheck labs in the am; no orders were places; paged MD for the order of 2 runs of K; no return call or orders placed; passed on to night shift about K; night shift will follow up.  Park BreedBARNETT, Batsheva Stevick M, RN

## 2013-04-05 NOTE — Progress Notes (Signed)
CT reviewed and noted question about anastamosis. There is no abscess or free air. He is  Not significantly tender there. Will follow clinically. Check CBC in AM. Patient examined and I agree with the assessment and plan  Tim [name] Fabro, MD, MPH, FACS Trauma: [phone] General Surgery: [phone]  04/05/2013 11:51 AM

## 2013-04-05 NOTE — Progress Notes (Signed)
Patient ID: Tim Short, male   DOB: 1934/01/26, 79 y.o.   MRN: [id number]  [facility] PROGRESS NOTE  Tim Short:[id number] DOB: 04-14-1934 DOA: 03/28/2013 PCP:  Duane Lope, MD  Brief narrative:  Pt is 79 yo male with fairly recent hospitalization at [facility] in September 2014 for failed maize procedure and during that hospitalization pt developed volvulus and has required right colectomy (10/08/2012), subsequently developed multiple abscesses and enteric fistulas managed percutaneous drains. Pt was in prolonged respiratory failure at [facility], unable to wean of the vent and requiring trach placement 10/29/2012. He was transferred to [facility] in December 2014 and later to [facility] in late January 2015. He was on TNA and was doing fairly well initially, but developed more abdominal pain and on 2/23 taken to OR in [place] for gallbladder removal (secondary to cholecystitis), resection for part of the colon for fistula, IVC filter placement. Sent back to [facility] on march 4th, 2015 and started on TPN. Diet advanced on March 9th, 2015 after pt passed swallow evaluation. He was brought to [facility] March 16th, after noticing progressive abdominal distension. In ED, CT abdomen with air- fluid level and findings consistent with early partial SBO, ileus. Surgery consulted and [facility] asked to admit for further evaluation.   Principal Problem:  Abdominal pain  - secondary to ileus, partial SBO --> now resolved  - appreciate surgery input  - abd XRAY with improving SBO, pt tolerating pure pudding thick diet  - continue supportive care with analgesia, antiemetics as needed for symptom control  Active Problems:  Atrial fibrillation  - rate in 120's - 130's  - Cardizem dose increased from 240 --> 360 mg PO QD, Pradaxa changed to Xarelto (3/23)  - appreciate cardiology input  - will need cardioversion once medically more stable  Leukocytosis  - likely secondary to ileus, ? HCAP as suggestive per CXR 3/20  - CT  abd/pelvis with mild leak around anastomosis but no other acute events  - WBC is trending down and will repeat CBC in AM ? HCAP  - continue vancomycin and Maxipime day #5/7 to cover for HCAP and will transition to Levaquin today for 2 more days to complete 7 days therapy  - WBC trending down  Pulmonary vascular congestion  - bilateral rhonchi and trace bilateral pitting edema  - IVF stopped 3/20 due to pulmonary vascular congestion  - monitor I's/O's, daily weights  - weight trend: 203 lbs --> 195 this AM) Hypokalemia  - will continue to supplement via IV route and repeat BMP in AM  Anemia of chronic disease  - Hg stable over the past 24 hours  - no signs of active bleeding  Essential hypertension  - slightly on soft side this AM  - close monitoring  Tracheostomy status  - suctioning as needed  Hypernatremia  - secondary to pre renal etiology, Na trending down slowly  Moderate malnutrition  - secondary to acute on chronic illness as outlined above  - per SLP, pudding thick liquids allowed  - pt tolerating well   Consultants:  Surgery  Cardiology  Procedures/Studies:  Ct Abdomen Pelvis W Contrast 03/28/2013 Findings consistent with persistent phlegmon in the liver. Interim removal of right upper quadrant drainage catheter. Colonic distention with air-fluid levels. Slight progression of soft tissue fluid in the anterior abdomen in the region the patient's scarring. 13 mm low-density lesion left kidney, not definite simple cyst.  Dg Abd 2 Views 03/30/2013 Mildly dilated loops of left upper quadrant small bowel  with scattered internal air-fluid levels, similar to the prior exam, with abnormal but nonspecific pattern which could be due to could ileus or partial SBO.  Dg Abd 2 View 03/29/2013 Nonspecific bowel gas pattern, as above, suggestive of partial small bowel obstruction. No pneumoperitoneum.  Dg Abd Acute W/chest 03/28/2013 Persistent small bowel dilatation compatible with either  partial SBO or ileus. No change in aeration to the lungs compared with previous exam. Antibiotics:  Vancomycin 3/20 --> 3/24 Maxipime 3/20 --> 3/24 Levaquin 3/24 -->   Code Status: Full  Family Communication: Son Onalee HuaDavid over the phone  Disposition Plan: Remains inpatient, SNF once pt medically cleared, ? Can pt go to SNF with trach   HPI/Subjective: No events overnight.   Objective: Filed Vitals:   04/05/13 0858 04/05/13 1148 04/05/13 1546 04/05/13 1617  BP:   113/81   Pulse: 121 116 125 125  Temp:   98 F (36.7 C)   TempSrc:   Oral   Resp: 20 20 20 20   Height:      Weight:      SpO2: 99% 98% 98% 100%    Intake/Output Summary (Last 24 hours) at 04/05/13 1741 Last data filed at 04/05/13 0730  Gross per 24 hour  Intake    170 ml  Output   1400 ml  Net  -1230 ml    Exam:   General:  Pt is alert, follows commands appropriately, not in acute distress, trach in place   Cardiovascular: Irregular rhythm, tachycardic, S1/S2, no murmurs, no rubs, no gallops  Respiratory: Clear to auscultation bilaterally, no wheezing, mild bibasilar rhonchi  Abdomen: Soft, non tender, non distended, bowel sounds present, no guarding  Extremities: No edema, pulses DP and PT palpable bilaterally  Data Reviewed: Basic Metabolic Panel:  Recent Labs Lab 04/02/13 0700 04/03/13 0505 04/04/13 0412 04/04/13 0620 04/05/13 0503  NA 149* 148* 150* 154* 152*  K 3.2* 3.3* 2.8* 2.7* 2.9*  CL 111 110 112 117* 115*  CO2 27 25 26 25 26   GLUCOSE 116* 105* 120* 115* 109*  BUN 12 13 13 12 11   CREATININE 0.93 0.92 0.83 0.75 0.77  CALCIUM 8.3* 8.4 8.3* 7.6* 8.2*   CBC:  Recent Labs Lab 04/01/13 0406 04/02/13 0700 04/03/13 0505 04/04/13 0412 04/05/13 0503  WBC 15.0* 14.2* 14.0* 11.9* 10.5  HGB 10.4* 9.3* 9.8* 9.1* 9.1*  HCT 34.3* 30.2* 32.2* 29.4* 29.3*  MCV 96.6 96.5 96.1 95.8 97.0  PLT 160 205 198 191 162   CBG:  Recent Labs Lab 04/04/13 1617 04/04/13 2121 04/05/13 0634  04/05/13 1114 04/05/13 1634  GLUCAP 158* 94 102* 123* 90    Recent Results (from the past 240 hour(s))  MRSA PCR SCREENING     Status: None   Collection Time    03/29/13  3:00 AM      Result Value Ref Range Status   MRSA by PCR NEGATIVE  NEGATIVE Final   Comment:            The GeneXpert MRSA Assay (FDA     approved for NASAL specimens     only), is one component of a     comprehensive MRSA colonization     surveillance program. It is not     intended to diagnose MRSA     infection nor to guide or     monitor treatment for     MRSA infections.  URINE CULTURE     Status: None   Collection Time    03/29/13  6:50 AM      Result Value Ref Range Status   Specimen Description URINE, RANDOM   Final   Special Requests NONE   Final   Culture  Setup Time     Final   Value: 03/29/2013 12:43     Performed at [facility]   Colony Count     Final   Value: NO GROWTH     Performed at [facility]   Culture     Final   Value: NO GROWTH     Performed at [facility]   Report Status 03/30/2013 FINAL   Final  CULTURE, BLOOD (ROUTINE X 2)     Status: None   Collection Time    04/01/13 10:55 AM      Result Value Ref Range Status   Specimen Description BLOOD LEFT HAND   Final   Special Requests BOTTLES DRAWN AEROBIC AND ANAEROBIC 5CC   Final   Culture  Setup Time     Final   Value: 04/01/2013 14:39     Performed at [facility]   Culture     Final   Value:        BLOOD CULTURE RECEIVED NO GROWTH TO DATE CULTURE WILL BE HELD FOR 5 DAYS BEFORE ISSUING A FINAL NEGATIVE REPORT     Performed at [facility]   Report Status PENDING   Incomplete  CULTURE, BLOOD (ROUTINE X 2)     Status: None   Collection Time    04/01/13 11:05 AM      Result Value Ref Range Status   Specimen Description BLOOD RIGHT HAND   Final   Special Requests BOTTLES DRAWN AEROBIC AND ANAEROBIC 5CC   Final   Culture  Setup Time     Final   Value: 04/01/2013 14:39     Performed  at [facility]   Culture     Final   Value:        BLOOD CULTURE RECEIVED NO GROWTH TO DATE CULTURE WILL BE HELD FOR 5 DAYS BEFORE ISSUING A FINAL NEGATIVE REPORT     Performed at [facility]   Report Status PENDING   Incomplete  CULTURE, RESPIRATORY (NON-EXPECTORATED)     Status: None   Collection Time    04/01/13  3:25 PM      Result Value Ref Range Status   Specimen Description TRACHEAL ASPIRATE   Final   Special Requests NONE   Final   Gram Stain     Final   Value: FEW WBC PRESENT,BOTH PMN AND MONONUCLEAR     NO SQUAMOUS EPITHELIAL CELLS SEEN     FEW GRAM POSITIVE RODS     RARE YEAST     Performed at [facility]   Culture     Final   Value: FEW KLEBSIELLA PNEUMONIAE     Performed at [facility]   Report Status 04/03/2013 FINAL   Final   Organism ID, Bacteria KLEBSIELLA PNEUMONIAE   Final  CULTURE, RESPIRATORY (NON-EXPECTORATED)     Status: None   Collection Time    04/02/13  8:34 AM      Result Value Ref Range Status   Specimen Description TRACHEAL ASPIRATE   Final   Special Requests NONE   Final   Gram Stain     Final   Value: ABUNDANT WBC PRESENT, PREDOMINANTLY PMN     MODERATE SQUAMOUS EPITHELIAL CELLS PRESENT     FEW GRAM POSITIVE  COCCI IN PAIRS     IN CLUSTERS IN CHAINS FEW GRAM NEGATIVE COCCI     Performed at [facility]   Culture     Final   Value: Non-Pathogenic Oropharyngeal-type Flora Isolated.     Performed at [facility]   Report Status 04/04/2013 FINAL   Final     Scheduled Meds: . amiodarone  400 mg Oral BID  . atorvastatin  40 mg Oral Daily  . chlorhexidine  15 mL Mouth/Throat BID  . diltiazem  240 mg Oral BID  . insulin aspart  0-9 Units Subcutaneous TID WC  . levofloxacin  750 mg Oral Daily  . pantoprazole  40 mg Oral Daily  . predniSONE  5 mg Oral Q breakfast  . rivaroxaban  20 mg Oral Q supper  . roflumilast  500 mcg Oral Daily  . sodium chloride  10-40 mL Intracatheter Q12H  . sodium  chloride  3 mL Intravenous Q12H   Continuous Infusions:    Debbora Presto, MD  [facility] Pager [phone]  If 7PM-7AM, please contact night-coverage [email or web address] Password [facility] 04/05/2013, 5:41 PM   LOS: 8 days

## 2013-04-06 LAB — BASIC METABOLIC PANEL
BUN: 11 mg/dL (ref 6–23)
CALCIUM: 8.3 mg/dL — AB (ref 8.4–10.5)
CO2: 26 mEq/L (ref 19–32)
CREATININE: 0.78 mg/dL (ref 0.50–1.35)
Chloride: 112 mEq/L (ref 96–112)
GFR, EST NON AFRICAN AMERICAN: 85 mL/min — AB (ref >90)
Glucose, Bld: 114 mg/dL — ABNORMAL HIGH (ref 70–99)
Potassium: 2.9 mEq/L — CL (ref 3.7–5.3)
Sodium: 152 mEq/L — ABNORMAL HIGH (ref 137–147)

## 2013-04-06 LAB — GLUCOSE, CAPILLARY
GLUCOSE-CAPILLARY: 130 mg/dL — AB (ref 70–99)
GLUCOSE-CAPILLARY: 92 mg/dL (ref 70–99)
Glucose-Capillary: 114 mg/dL — ABNORMAL HIGH (ref 70–99)
Glucose-Capillary: 119 mg/dL — ABNORMAL HIGH (ref 70–99)

## 2013-04-06 LAB — CBC
HCT: 28.9 % — ABNORMAL LOW (ref 39.0–52.0)
Hemoglobin: 8.9 g/dL — ABNORMAL LOW (ref 13.0–17.0)
MCH: 30 pg (ref 26.0–34.0)
MCHC: 30.8 g/dL (ref 30.0–36.0)
MCV: 97.3 fL (ref 78.0–100.0)
Platelets: 162 K/uL (ref 150–400)
RBC: 2.97 MIL/uL — ABNORMAL LOW (ref 4.22–5.81)
RDW: 20.9 % — ABNORMAL HIGH (ref 11.5–15.5)
WBC: 10.4 K/uL (ref 4.0–10.5)

## 2013-04-06 LAB — POTASSIUM: Potassium: 3.3 mEq/L — ABNORMAL LOW (ref 3.7–5.3)

## 2013-04-06 LAB — MAGNESIUM: Magnesium: 2.2 mg/dL (ref 1.5–2.5)

## 2013-04-06 MED ORDER — POTASSIUM CHLORIDE 10 MEQ/100ML IV SOLN
10.0000 meq | INTRAVENOUS | Status: AC
  Administered 2013-04-06 (×4): 10 meq via INTRAVENOUS
  Filled 2013-04-06 (×5): qty 100

## 2013-04-06 MED ORDER — POTASSIUM CHLORIDE 20 MEQ/15ML (10%) PO LIQD
40.0000 meq | Freq: Once | ORAL | Status: DC
  Filled 2013-04-06: qty 30

## 2013-04-06 MED ORDER — POTASSIUM CHLORIDE CRYS ER 20 MEQ PO TBCR
40.0000 meq | EXTENDED_RELEASE_TABLET | Freq: Once | ORAL | Status: AC
  Administered 2013-04-06: 40 meq via ORAL
  Filled 2013-04-06: qty 2

## 2013-04-06 MED ORDER — ENSURE PUDDING PO PUDG
1.0000 | Freq: Three times a day (TID) | ORAL | Status: DC
  Administered 2013-04-06 – 2013-04-16 (×23): 1 via ORAL
  Filled 2013-04-06 (×3): qty 1

## 2013-04-06 NOTE — Progress Notes (Signed)
NUTRITION FOLLOW UP  INTERVENTION:  Consider initiation of short-term enteral nutrition support  Ensure Pudding po TID, each supplement provides 170 kcal and 4 grams of protein RD to follow for nutrition care plan  NUTRITION DIAGNOSIS: Inadequate oral intake now related to dysphagia, poor appetite as evidenced by 0-20%, ongoing  Goal: Pt to meet >/= 90% of their estimated nutrition needs, unmet   Monitor:  Nutrition support initiation, PO intake, weight, labs, I/O's  ASSESSMENT: 79 yo male with recent complications at [facility] after an ablation for afib in Oct 2014 he developed a volvulus after the ablation was intubated/trached; required multiple abd surgeries then developed fistula.   Has been on TPN, with NGT on/off since. Is at [facility] and sent to [facility] for complaints of abd pain. Pt was taken off TPN and NGT was removed last week. He has been eating by mouth and doing well. Last 4 days with worsening abd pain, no N/V/D.  Patient s/p MBSS 3/21.  SLP recommending Dys 1-pudding thick liquid diet.  PO intake very poor; mostly 0% per flowsheet records.  Ate some of his Magic Cup this AM at breakfast (tray on his tray table upon RD visit).    Surgery notes reviewed.  Patient's ileus resolved.  Some nausea.  Having BM's and flatus.  RD spoke with Dr. Elvera Lennox regarding consideration of short-term nutrition support.  Height: Ht Readings from Last 1 Encounters:  03/30/13 5' 8.9" (1.75 m)    Weight: Wt Readings from Last 1 Encounters:  04/05/13 195 lb 15.8 oz (88.9 kg)    BMI:  Body mass index is 29.03 kg/(m^2).  Re-estimated needs: Kcal: 2050-2250 Protein: 110-120 gm Fluid: 2.0-2.2 L  Skin: Intact  Diet Order: Dysphagia 1, pudding thick liquids   Intake/Output Summary (Last 24 hours) at 04/06/13 1054 Last data filed at 04/05/13 2058  Gross per 24 hour  Intake    120 ml  Output    650 ml  Net   -530 ml    Labs:   Recent Labs Lab 04/04/13 0620 04/05/13 0503  04/05/13 2225 04/06/13 0440  NA 154* 152*  --  152*  K 2.7* 2.9* 3.1* 2.9*  CL 117* 115*  --  112  CO2 25 26  --  26  BUN 12 11  --  11  CREATININE 0.75 0.77  --  0.78  CALCIUM 7.6* 8.2*  --  8.3*  GLUCOSE 115* 109*  --  114*    CBG (last 3)   Recent Labs  04/05/13 1634 04/05/13 2049 04/06/13 0615  GLUCAP 90 105* 114*    Scheduled Meds: . amiodarone  400 mg Oral BID  . atorvastatin  40 mg Oral Daily  . chlorhexidine  15 mL Mouth/Throat BID  . diltiazem  240 mg Oral BID  . insulin aspart  0-9 Units Subcutaneous TID WC  . levofloxacin  750 mg Oral Daily  . pantoprazole  40 mg Oral Daily  . potassium chloride  10 mEq Intravenous Q1 Hr x 4  . predniSONE  5 mg Oral Q breakfast  . rivaroxaban  20 mg Oral Q supper  . roflumilast  500 mcg Oral Daily  . sodium chloride  10-40 mL Intracatheter Q12H  . sodium chloride  3 mL Intravenous Q12H    Continuous Infusions:    Past Medical History  Diagnosis Date  . Hypertension   . Hypercholesteremia   . Asthma   . Meniere disease   . Persistent atrial fibrillation  a. s/p multiple dccv's;  b. failed amio;  c. not felt to be AF RFCA canddiate due to LA dil;  d. s/p failed hybrid ablation @ [facility] in 09/2012;  e. chronic pradaxa.  . Obesity   . Biatrial enlargement     LA size 5.3cm  . Obstructive sleep apnea     AHI 108/hr now on CPAP at 12cm H2O  . H/O hiatal hernia   . GERD (gastroesophageal reflux disease)     "associated w/hiatal hernia" (06/04/2012)  . Peptic ulcer 1950's  . Migraines     "haven't had one for about 15 years" (06/04/2012)  . Arthritis     "joints" (06/04/2012)  . Chronic lower back pain   . Nephrolithiasis ~ 1960's    "passed on their own" (06/04/2012)  . History of pneumonia 2002; 2006    "spent 8 days in isolation; Norovirus" (06/04/2012)  . Other and unspecified angina pectoris   . RLS (restless legs syndrome)   . Coronary artery disease     a. 05/2012 Cath/PCI: LM nl, LAD nl, LCX 1539m (3.0x15  Integrity BMS), PTCA of OM1 through stent struts (kissing balloon).  . Diabetes mellitus without complication     FAMILY STATES PATIENT IS NOT DIABETIC    Past Surgical History  Procedure Laterality Date  . Wrist fracture surgery  1992    "repaired w/left hip bone graft" (06/04/2012)  . Total knee arthroplasty  08/19/1999  . Colonoscopy w/ polypectomy  2006  . Knee arthroscopy with meniscal repair Right 1974    "medial meniscus repaired" (06/04/2012)  . Cardioversion  10/02/2011    Procedure: CARDIOVERSION;  Surgeon: Corky CraftsJayadeep S. Varanasi, MD;  [facility];  Service: Cardiovascular;  Laterality: N/A;  h/p in file drawer  . Cardioversion  11/07/2011    Procedure: CARDIOVERSION;  Surgeon: Corky CraftsJayadeep S. Varanasi, MD;  [facility];  Service: Cardiovascular;  Laterality: N/A;  h/p from 10/22 in file drawer/dl  . Cardiac catheterization  05/26/2012  . Coronary angioplasty with stent placement  06/04/2012    "1" (06/04/2012)  . Cataract extraction w/ intraocular lens  implant, bilateral  2009  . Hemorrhoid surgery  ~ 2003  . Hiatal hernia repair  1983  . Nissen fundoplication  1983  . Ablation of dysrhythmic focus  SEPT/OCT 2014    ATRIAL FIB  . Inguinal hernia repair Bilateral 2000 and 2005    "one done at a time" (06/04/2012)  . Colon surgery  10/08/2012   . Tracheostomy  OCT 2014    Maureen ChattersKatie Emrick Hensch, RD, LDN Pager #: [phone] After-Hours Pager #: [phone]

## 2013-04-06 NOTE — Progress Notes (Signed)
Patient examined and I agree with the assessment and plan  Violeta GelinasBurke Wenzel Backlund, MD, MPH, FACS Trauma: [phone] General Surgery: [phone]  04/06/2013 5:48 PM

## 2013-04-06 NOTE — Progress Notes (Signed)
Clinical Social Work Department BRIEF PSYCHOSOCIAL ASSESSMENT 04/06/2013  Patient:  Tim Short,Tim Short     Account Number:  [id number]     Admit date:  03/28/2013  Clinical Social Worker:  Carren RangPURITZ,Thang Flett, LCSWA  Date/Time:  04/06/2013 10:57 AM  Referred by:  Care Management  Date Referred:  04/06/2013 Referred for  SNF Placement   Other Referral:   Interview type:  Patient Other interview type:    PSYCHOSOCIAL DATA Living Status:  FACILITY Admitted from facility:  OTHER Level of care:  Skilled Nursing Facility Primary support name:  Adela PortsDavid Short Primary support relationship to patient:  CHILD, ADULT Degree of support available:   Good    CURRENT CONCERNS Current Concerns  Post-Acute Placement   Other Concerns:   Patient from [name]    SOCIAL WORK ASSESSMENT / PLAN Per CM, patient is from [name] and Care Management has been speaking with Misty StanleyStacey from SNF about patient's care. Per CM, patient may be a canidate for LTACH. CSW went into room and introduced self and explained reason for visit. Patient had visitors by bedside. CSW was there to provide emotional support throughout visit. [facility] CSW explained that social worker will help at discharge once patient is medically ready. Patient thanked Child psychotherapistsocial worker and proceeded on with his visitors.   Assessment/plan status:  Psychosocial Support/Ongoing Assessment of Needs Other assessment/ plan:   Information/referral to community resources:   CSW information    PATIENT'S/FAMILY'S RESPONSE TO PLAN OF CARE: Patient appeared in good spirits and states he is from [name].        Maree KrabbeLindsay Yama Nielson, MSW, Theresia MajorsLCSWA [phone]

## 2013-04-06 NOTE — Progress Notes (Signed)
Speech Language Pathology Treatment: Dysphagia;Passy Muir Speaking valve  Patient Details Name: Tim Short MRN: [id number] DOB: 11/17/1934 Today's Date: 04/06/2013 Time: [id number] SLP Time Calculation (min): 18 min  Assessment / Plan / Recommendation Clinical Impression  Pt demonstrates tolerance of current, highly restrictive diet with no liquids allowed. Led pt in instruction for effortful swallows for increased strength and encouraged practice during meals. Given prolonged illness and severe dysphagia concerned that pt may need a long term solution for PO if there is no short term improvement in function. Will plan for repeat MBS tomorrow with utilization of any effective strategies to get pt drinking liquids. Pt also wearing PMSV during all waking hours, pt/staff following recommendation to remove during sleep. No evidence of intolerance.    HPI HPI: Pt is 79 yo male with fairly recent [facility] for failed maize procedure and during that hospitalization pt developed volvulus and has required right colectomy (10/08/2012), subsequently developed multiple abscesses and enteric fistulas managed percutaneous drains. Pt was in [facility], unable to wean of the vent and requiring trach placement 10/29/2012. He was transferred to [facility] in December 2014 and later to [facility] in late January 2015. He was on TNA and was doing fairly well initially, but developed more abdominal pain and on 2/23 [place] [facility] [place] gallbladder removal (secondary to cholecystitis), resection for part of the colon for fistula, IVC filter placement. Sent back to [facility] on march 4th, 2015 and started on TPN. Diet advanced on March 9th, 2015 after pt passed swallow evaluation. [facility], after noticing progressive abdominal distension. In ED, CT abdomen with air- fluid level and findings consistent with early partial SBO, ileus. Surgery reports pt  is ok for PO from their standpoint, SLP ordered for swallow eval.    Pertinent Vitals NA  SLP Plan  Continue with current plan of care    Recommendations Diet recommendations: Pudding-thick liquid;Dysphagia 1 (puree) Liquids provided via: Cup;Teaspoon Medication Administration: Crushed with puree Supervision: Patient able to self feed;Full supervision/cueing for compensatory strategies Compensations: Slow rate;Small sips/bites;Multiple dry swallows after each bite/sip;Hard cough after swallow Postural Changes and/or Swallow Maneuvers: Seated upright 90 degrees;Upright 30-60 min after meal      Patient may use Passy-Muir Speech Valve: Intermittently with supervision;During all therapies with supervision;Caregiver trained to provide supervision PMSV Supervision: Full       Oral Care Recommendations: Oral care BID Follow up Recommendations: Skilled Nursing facility Plan: Continue with current plan of care    GO    [facility] Regional Medical CenterBonnie Denham Mose, MA CCC-SLP [phone]  Tim Short, Tim Short 04/06/2013, 10:03 AM

## 2013-04-06 NOTE — Progress Notes (Signed)
CRITICAL VALUE ALERT  Critical value received:  Potassium  Date of notification:  04/06/13  Time of notification:  0609  Critical value read back: yes  Nurse who received alert:  Ciji Boston  MD notified (1st page):  Schorr  Time of first page:  0610  Responding MD:  Schorr  Time MD responded:  [phone]

## 2013-04-06 NOTE — Progress Notes (Signed)
No new recommendations at this time.

## 2013-04-06 NOTE — Progress Notes (Signed)
Subjective: Pt feeling slightly better than yesterday.  Having some nausea but denies any emesis.  Having BM's and flatus.  Dressing changes going well.     Objective: Vital signs in last 24 hours: Temp:  [98 F (36.7 C)-98.3 F (36.8 C)] 98.2 F (36.8 C) (03/25 0347) Pulse Rate:  [116-131] 131 (03/25 0943) Resp:  [20-24] 20 (03/25 0943) BP: (113-134)/(79-89) 115/79 mmHg (03/25 0347) SpO2:  [95 %-100 %] 99 % (03/25 0943) FiO2 (%):  [28 %-35 %] 28 % (03/25 0943) Last BM Date: 04/04/13  Intake/Output from previous day: 03/24 0701 - 03/25 0700 In: 240 [P.O.:240] Out: 1050 [Urine:1050] Intake/Output this shift:    PE: Gen:  Alert, NAD, pleasant Cardio: RRR, holosytolic +2 murmur at apex  Abd: Soft, NT/ND, +BS, no HSM, midline wound intact, healing, RLQ/flank wound clean   Lab Results:   Recent Labs  04/05/13 0503 04/06/13 0440  WBC 10.5 10.4  HGB 9.1* 8.9*  HCT 29.3* 28.9*  PLT 162 162   BMET  Recent Labs  04/05/13 0503 04/05/13 2225 04/06/13 0440  NA 152*  --  152*  K 2.9* 3.1* 2.9*  CL 115*  --  112  CO2 26  --  26  GLUCOSE 109*  --  114*  BUN 11  --  11  CREATININE 0.77  --  0.78  CALCIUM 8.2*  --  8.3*   PT/INR No results found for this basename: LABPROT, INR,  in the last 72 hours CMP     Component Value Date/Time   NA 152* 04/06/2013 0440   K 2.9* 04/06/2013 0440   CL 112 04/06/2013 0440   CO2 26 04/06/2013 0440   GLUCOSE 114* 04/06/2013 0440   BUN 11 04/06/2013 0440   CREATININE 0.78 04/06/2013 0440   CALCIUM 8.3* 04/06/2013 0440   PROT 5.6* 03/28/2013 1831   ALBUMIN 2.3* 03/28/2013 1831   AST 26 03/28/2013 1831   ALT 30 03/28/2013 1831   ALKPHOS 67 03/28/2013 1831   BILITOT 0.4 03/28/2013 1831   GFRNONAA 85* 04/06/2013 0440   GFRAA >90 04/06/2013 0440   Lipase     Component Value Date/Time   LIPASE 30 03/28/2013 1831       Studies/Results: Ct Abdomen Pelvis W Contrast  04/04/2013   CLINICAL DATA:  Liver flight non.  Leukocytosis.  EXAM:  CT ABDOMEN AND PELVIS WITH CONTRAST  TECHNIQUE: Multidetector CT imaging of the abdomen and pelvis was performed using the standard protocol following bolus administration of intravenous contrast.  CONTRAST:  90mL OMNIPAQUE IOHEXOL 300 MG/ML  SOLN  COMPARISON:  DG ABD PORTABLE 1V dated 04/04/2013; CT ABD/PELVIS W CM dated 03/28/2013; CT ABD-PELV W/O CM dated 03/11/2013; CT ABD/PELVIS W CM dated 01/30/2013  FINDINGS: Stable moderate left and small right pleural effusions with associated passive atelectasis. Mild cardiomegaly. Postoperative findings along the gastroesophageal junction.  Slightly reduce marginal definition of the infiltrative hypodense process involving the right hepatic lobe and portions of segment 4 of the liver.  The spleen, adrenal glands, and pancreas unremarkable. Small amount of fluid along the gallbladder fossa and along the inferior edge of the right hepatic lobe could, similar to prior. Gallbladder surgically absent.  There is contrast medium in the renal collecting systems on the initial portal venous phase images, likely due to inadvertent early injection.  Stable renal hypodensities. Infrarenal IVC filter. Continued fluid along the inferior margin of the laparotomy wound. Trace fluid in the lower omentum. Stable trace presacral edema.  Bilateral chronic pars defects  at L5 observed with 9 mm of anterolisthesis.  Orally administered contrast extends through to the rectum. Mild circumferential rectal wall thickening.  The patient seems to have had right hemicolectomy, with reanastomosis to the small bowel on image 39 of series 5. Outpouching from the proximal terminal margin of the remaining colon shown on images 32 through 21 of series 5, potentially some type of postoperative diverticulum or contained leak along the stable site.  IMPRESSION: 1. Similar distribution but reduced conspicuity of the peripheral infiltrative hepatic hypodensity affecting the right hepatic lobe an adjacent portion of  segment 4. Fatty infiltration is the most common cause for and infiltrative hypodensity of this type, although localized parenchymal inflammation is not entirely excluded. MRI could differentiate if clinically warranted. 2. Small amount of perihepatic ascites, similar to prior. 3. Hypodense renal lesions, similar to prior. 4. Patient appears to have had right hemicolectomy with small bowel reanastomosis. There is a collection of gas and contrast extending to the right of the proximal terminal margin of the remaining colon, potentially a small contained leak along the staple line. 5. Continued fluid along the inferior margin of the laparotomy wound, but without internal gas density currently.   Electronically Signed   By: Herbie Baltimore M.D.   On: 04/04/2013 18:40    Anti-infectives: Anti-infectives   Start     Dose/Rate Route Frequency Ordered Stop   04/05/13 1200  levofloxacin (LEVAQUIN) tablet 750 mg     750 mg Oral Daily 04/05/13 0945     04/01/13 1230  vancomycin (VANCOCIN) 1,500 mg in sodium chloride 0.9 % 500 mL IVPB  Status:  Discontinued     1,500 mg 250 mL/hr over 120 Minutes Intravenous Every 24 hours 04/01/13 1150 04/05/13 0945   04/01/13 1030  ceFEPIme (MAXIPIME) 1 g in dextrose 5 % 50 mL IVPB  Status:  Discontinued     1 g 100 mL/hr over 30 Minutes Intravenous Every 8 hours 04/01/13 1011 04/05/13 0945   03/29/13 1000  vancomycin (VANCOCIN) 1,500 mg in sodium chloride 0.9 % 500 mL IVPB  Status:  Discontinued     1,500 mg 250 mL/hr over 120 Minutes Intravenous Daily 03/29/13 0245 03/29/13 1117   03/29/13 0600  piperacillin-tazobactam (ZOSYN) IVPB 3.375 g  Status:  Discontinued     3.375 g 12.5 mL/hr over 240 Minutes Intravenous 3 times per day 03/29/13 0301 03/29/13 1117   03/29/13 0245  piperacillin-tazobactam (ZOSYN) injection 3.375 g  Status:  Discontinued     3.375 g Intramuscular Every 6 hours 03/29/13 0245 03/29/13 0300       Assessment/Plan 79 y/o with previous Right  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 Pt had recent fistula takedown and cholecystectomy performed at [facility].  Was recently at [facility]. S/P multiple abdominal procedures at outside hospital - WD dressing changes to RLQ wound, dry dressing to midline wound Ileus - resolved Abdominal pain - Resolving, continue to follow  Hypokalemia - primary is supplementing Leukocytosis resolved (10.4)- CT shows potentially a small contained leak along staple line, fluid at inferior laparotomy wound Hypernatremia - Most likely hypertonic hypovolemic hypernatremia secondary to increase H2O loss vs ADH deficiency or resistance.  Per primary, would increase free water access  FEN - abd x-ray shows less SB distention and there in contrast in colon Dehydration - per primary, D1 diet per SLP VTE - SCD's and not currently on pharm  DVT proph    LOS: 9 days    Tim Short, Tim Short 04/06/2013, 10:34 AM Pager: [phone]

## 2013-04-06 NOTE — Progress Notes (Signed)
Patient ID: Tim Short, male   DOB: 15-Apr-1934, 79 y.o.   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 Pt was in prolonged respiratory failure at [facility], unable to wean of the vent and requiring trach placement 10/29/2012. He was transferred to [facility] in December 2014 and later to [facility] in late January 2015. He was on TNA and was doing fairly well initially, but developed more abdominal pain and on 2/23 taken to OR in [place] for gallbladder removal (secondary to cholecystitis), resection for part of the colon for fistula, IVC filter placement. Sent back to [facility] on march 4th, 2015 and started on TPN. Diet advanced on March 9th, 2015 after pt passed swallow evaluation. He was brought to [facility] March 16th, after noticing progressive abdominal distension. In ED, CT abdomen with air- fluid level and findings consistent with early partial SBO, ileus. Surgery consulted and [facility] asked to admit for further evaluation.   A/P Abdominal pain  - secondary to ileus, partial SBO --> now resolved  - appreciate surgery input  - abd XRAY with improving SBO, pt tolerating pure pudding thick diet  - continue supportive care with analgesia, antiemetics as needed for symptom control  Atrial fibrillation  - rate in 120's - 130's  - Cardizem dose increased from 240 --> 360 mg PO QD, Pradaxa changed to Xarelto (3/23)  - appreciate cardiology input  - will need cardioversion once medically more stable, appreciate cardiology input regarding timing, whether this will be done as inpatient or outpatient.  Leukocytosis  - likely  secondary to ileus, ? HCAP as suggestive per CXR 3/20  - CT abd/pelvis with mild leak around anastomosis but no other acute events  - WBC is trending down and within normal limits in the past 2 days.  ? HCAP  - was on vancomycin and Maxipime for 5 days to cover for HCAP and transitioned to Levaquin 3/24 for 2 more days to complete 7 days therapy. Last dose on 3/26.  Pulmonary vascular congestion  - bilateral rhonchi and trace bilateral pitting edema  - IVF stopped 3/20 due to pulmonary vascular congestion  - monitor I's/O's, daily weights  - weight trend: 203 lbs --> 195 3/24, will weigh again in am Hypokalemia  - will continue to supplement via IV route and repeat BMP in AM  - check magnesium Anemia of chronic disease  - Hg stable over the past 24 hours  - no signs of active bleeding  Essential hypertension  - slightly on soft side this AM  - close monitoring  Tracheostomy status  - suctioning as needed  Hypernatremia  - due to dehydration, encourage free water Moderate malnutrition  - secondary to acute on chronic illness as outlined above  - per SLP, pudding thick liquids allowed  - pt tolerating well however does not seem to meet his nutritional requirements. Discussed with son and patient re TPN vs PEG tube, they will talk themselves and let me know.    Consultants:  Surgery  Cardiology  Procedures/Studies:  Ct Abdomen Pelvis W Contrast 03/28/2013 Findings consistent with persistent phlegmon in the liver. Interim removal of right upper quadrant drainage catheter. Colonic distention  with air-fluid levels. Slight progression of soft tissue fluid in the anterior abdomen in the region the patient's scarring. 13 mm low-density lesion left kidney, not definite simple cyst.  Dg Abd 2 Views 03/30/2013 Mildly dilated loops of left upper quadrant small bowel with scattered internal air-fluid levels, similar to the prior exam, with abnormal but nonspecific pattern which could be due to could  ileus or partial SBO.  Dg Abd 2 View 03/29/2013 Nonspecific bowel gas pattern, as above, suggestive of partial small bowel obstruction. No pneumoperitoneum.  Dg Abd Acute W/chest 03/28/2013 Persistent small bowel dilatation compatible with either partial SBO or ileus. No change in aeration to the lungs compared with previous exam. Antibiotics:  Vancomycin 3/20 --> 3/24 Maxipime 3/20 --> 3/24 Levaquin 3/24 -->   Code Status: Full  Family Communication: Son Onalee Hua over the phone  Disposition Plan: Remains inpatient, LTACH vs SNF  HPI/Subjective: No events overnight.   Objective: Filed Vitals:   04/05/13 2110 04/06/13 0001 04/06/13 0340 04/06/13 0347  BP:    115/79  Pulse: 129 125 124 124  Temp:    98.2 F (36.8 C)  TempSrc:    Oral  Resp: 24 22 20 20   Height:      Weight:      SpO2: 100% 99% 95% 96%    Intake/Output Summary (Last 24 hours) at 04/06/13 0746 Last data filed at 04/05/13 2058  Gross per 24 hour  Intake    120 ml  Output    650 ml  Net   -530 ml   Exam:  General:  Pt is alert, follows commands appropriately, not in acute distress, trach in place   Cardiovascular: Irregular rhythm, tachycardic, S1/S2, no murmurs, no rubs, no gallops  Respiratory: Clear to auscultation bilaterally, no wheezing, mild bibasilar rhonchi  Abdomen: Soft, non tender, non distended, bowel sounds present, no guarding  Extremities: No edema, pulses DP and PT palpable bilaterally  Data Reviewed: Basic Metabolic Panel:  Recent Labs Lab 04/03/13 0505 04/04/13 0412 04/04/13 0620 04/05/13 0503 04/05/13 2225 04/06/13 0440  NA 148* 150* 154* 152*  --  152*  K 3.3* 2.8* 2.7* 2.9* 3.1* 2.9*  CL 110 112 117* 115*  --  112  CO2 25 26 25 26   --  26  GLUCOSE 105* 120* 115* 109*  --  114*  BUN 13 13 12 11   --  11  CREATININE 0.92 0.83 0.75 0.77  --  0.78  CALCIUM 8.4 8.3* 7.6* 8.2*  --  8.3*   CBC:  Recent Labs Lab 04/02/13 0700 04/03/13 0505 04/04/13 0412 04/05/13 0503  04/06/13 0440  WBC 14.2* 14.0* 11.9* 10.5 10.4  HGB 9.3* 9.8* 9.1* 9.1* 8.9*  HCT 30.2* 32.2* 29.4* 29.3* 28.9*  MCV 96.5 96.1 95.8 97.0 97.3  PLT 205 198 191 162 162   CBG:  Recent Labs Lab 04/05/13 0634 04/05/13 1114 04/05/13 1634 04/05/13 2049 04/06/13 0615  GLUCAP 102* 123* 90 105* 114*    Recent Results (from the past 240 hour(s))  MRSA PCR SCREENING     Status: None   Collection Time    03/29/13  3:00 AM      Result Value Ref Range Status   MRSA by PCR NEGATIVE  NEGATIVE Final   Comment:            The GeneXpert MRSA Assay (FDA     approved for NASAL specimens     only), is one component of a     comprehensive MRSA  colonization     surveillance program. It is not     intended to diagnose MRSA     infection nor to guide or     monitor treatment for     MRSA infections.  URINE CULTURE     Status: None   Collection Time    03/29/13  6:50 AM      Result Value Ref Range Status   Specimen Description URINE, RANDOM   Final   Special Requests NONE   Final   Culture  Setup Time     Final   Value: 03/29/2013 12:43     Performed at [facility]   Colony Count     Final   Value: NO GROWTH     Performed at [facility]   Culture     Final   Value: NO GROWTH     Performed at [facility]   Report Status 03/30/2013 FINAL   Final  CULTURE, BLOOD (ROUTINE X 2)     Status: None   Collection Time    04/01/13 10:55 AM      Result Value Ref Range Status   Specimen Description BLOOD LEFT HAND   Final   Special Requests BOTTLES DRAWN AEROBIC AND ANAEROBIC 5CC   Final   Culture  Setup Time     Final   Value: 04/01/2013 14:39     Performed at [facility]   Culture     Final   Value:        BLOOD CULTURE RECEIVED NO GROWTH TO DATE CULTURE WILL BE HELD FOR 5 DAYS BEFORE ISSUING A FINAL NEGATIVE REPORT     Performed at [facility]   Report Status PENDING   Incomplete  CULTURE, BLOOD (ROUTINE X 2)     Status: None   Collection Time     04/01/13 11:05 AM      Result Value Ref Range Status   Specimen Description BLOOD RIGHT HAND   Final   Special Requests BOTTLES DRAWN AEROBIC AND ANAEROBIC 5CC   Final   Culture  Setup Time     Final   Value: 04/01/2013 14:39     Performed at [facility]   Culture     Final   Value:        BLOOD CULTURE RECEIVED NO GROWTH TO DATE CULTURE WILL BE HELD FOR 5 DAYS BEFORE ISSUING A FINAL NEGATIVE REPORT     Performed at [facility]   Report Status PENDING   Incomplete  CULTURE, RESPIRATORY (NON-EXPECTORATED)     Status: None   Collection Time    04/01/13  3:25 PM      Result Value Ref Range Status   Specimen Description TRACHEAL ASPIRATE   Final   Special Requests NONE   Final   Gram Stain     Final   Value: FEW WBC PRESENT,BOTH PMN AND MONONUCLEAR     NO SQUAMOUS EPITHELIAL CELLS SEEN     FEW GRAM POSITIVE RODS     RARE YEAST     Performed at [facility]   Culture     Final   Value: FEW KLEBSIELLA PNEUMONIAE     Performed at [facility]   Report Status 04/03/2013 FINAL   Final   Organism ID, Bacteria KLEBSIELLA PNEUMONIAE   Final  CULTURE, RESPIRATORY (NON-EXPECTORATED)     Status: None   Collection Time    04/02/13  8:34 AM  Result Value Ref Range Status   Specimen Description TRACHEAL ASPIRATE   Final   Special Requests NONE   Final   Gram Stain     Final   Value: ABUNDANT WBC PRESENT, PREDOMINANTLY PMN     MODERATE SQUAMOUS EPITHELIAL CELLS PRESENT     FEW GRAM POSITIVE COCCI IN PAIRS     IN CLUSTERS IN CHAINS FEW GRAM NEGATIVE COCCI     Performed at [facility]   Culture     Final   Value: Non-Pathogenic Oropharyngeal-type Flora Isolated.     Performed at [facility]   Report Status 04/04/2013 FINAL   Final    Scheduled Meds: . amiodarone  400 mg Oral BID  . atorvastatin  40 mg Oral Daily  . chlorhexidine  15 mL Mouth/Throat BID  . diltiazem  240 mg Oral BID  . insulin aspart  0-9 Units Subcutaneous TID  WC  . levofloxacin  750 mg Oral Daily  . pantoprazole  40 mg Oral Daily  . potassium chloride  10 mEq Intravenous Q1 Hr x 4  . predniSONE  5 mg Oral Q breakfast  . rivaroxaban  20 mg Oral Q supper  . roflumilast  500 mcg Oral Daily  . sodium chloride  10-40 mL Intracatheter Q12H  . sodium chloride  3 mL Intravenous Q12H   Continuous Infusions:   Pamella Pert, MD  Pager [phone]  Time spent: 35 minutes  If 7PM-7AM, please contact night-coverage [email or web address] Password [facility] 04/06/2013, 7:46 AM   LOS: 9 days

## 2013-04-07 ENCOUNTER — Inpatient Hospital Stay (HOSPITAL_COMMUNITY): Payer: Medicare HMO

## 2013-04-07 LAB — CBC
HEMATOCRIT: 29.5 % — AB (ref 39.0–52.0)
HEMOGLOBIN: 9 g/dL — AB (ref 13.0–17.0)
MCH: 30.1 pg (ref 26.0–34.0)
MCHC: 30.5 g/dL (ref 30.0–36.0)
MCV: 98.7 fL (ref 78.0–100.0)
Platelets: 147 10*3/uL — ABNORMAL LOW (ref 150–400)
RBC: 2.99 MIL/uL — AB (ref 4.22–5.81)
RDW: 21.2 % — ABNORMAL HIGH (ref 11.5–15.5)
WBC: 9.7 10*3/uL (ref 4.0–10.5)

## 2013-04-07 LAB — BASIC METABOLIC PANEL
BUN: 11 mg/dL (ref 6–23)
CHLORIDE: 119 meq/L — AB (ref 96–112)
CO2: 28 mEq/L (ref 19–32)
Calcium: 8.7 mg/dL (ref 8.4–10.5)
Creatinine, Ser: 0.76 mg/dL (ref 0.50–1.35)
GFR calc Af Amer: >90 mL/min (ref >90)
GFR, EST NON AFRICAN AMERICAN: 86 mL/min — AB (ref >90)
Glucose, Bld: 126 mg/dL — ABNORMAL HIGH (ref 70–99)
POTASSIUM: 3.3 meq/L — AB (ref 3.7–5.3)
SODIUM: 158 meq/L — AB (ref 137–147)

## 2013-04-07 LAB — CULTURE, BLOOD (ROUTINE X 2)
CULTURE: NO GROWTH
Culture: NO GROWTH

## 2013-04-07 LAB — GLUCOSE, CAPILLARY
GLUCOSE-CAPILLARY: 100 mg/dL — AB (ref 70–99)
Glucose-Capillary: 114 mg/dL — ABNORMAL HIGH (ref 70–99)
Glucose-Capillary: 95 mg/dL (ref 70–99)
Glucose-Capillary: 149 mg/dL — ABNORMAL HIGH (ref 70–99)

## 2013-04-07 MED ORDER — DEXTROSE 10 % IV SOLN
INTRAVENOUS | Status: AC
  Administered 2013-04-07: 50 mL/h via INTRAVENOUS

## 2013-04-07 MED ORDER — M.V.I. ADULT IV INJ
INTRAVENOUS | Status: AC
  Administered 2013-04-07: 18:00:00 via INTRAVENOUS
  Filled 2013-04-07: qty 1000

## 2013-04-07 MED ORDER — FAT EMULSION 20 % IV EMUL
250.0000 mL | INTRAVENOUS | Status: AC
  Administered 2013-04-07: 250 mL via INTRAVENOUS
  Filled 2013-04-07: qty 250

## 2013-04-07 MED ORDER — POTASSIUM CHLORIDE 20 MEQ/15ML (10%) PO LIQD
40.0000 meq | Freq: Once | ORAL | Status: AC
  Administered 2013-04-07: 40 meq via ORAL
  Filled 2013-04-07: qty 30

## 2013-04-07 NOTE — Progress Notes (Signed)
PARENTERAL NUTRITION CONSULT NOTE - FOLLOW UP  Pharmacy Consult for TPN Indication: inadequate PO intake, resolving SBO  Allergies  Allergen Reactions  . Corticosteroids     Inhaled Corticosteroids--Hoarseness, dry mouth  . Furosemide Other (See Comments)    Ototoxicity     Patient Measurements: Height: 5' 8.9" (175 cm) Weight: 195 lb 15.8 oz (88.9 kg) IBW/kg (Calculated) : 70.47   Vital Signs: Temp: 98.3 F (36.8 C) (03/26 1416) Temp src: Oral (03/26 1416) BP: 116/72 mmHg (03/26 1416) Pulse Rate: 119 (03/26 1416) Intake/Output from previous day: 03/25 0701 - 03/26 0700 In: 120 [P.O.:120] Out: 902 [Urine:900; Stool:2] Intake/Output from this shift: Total I/O In: 100 [P.O.:100] Out: -   Labs:  Recent Labs  04/05/13 0503 04/06/13 0440 04/07/13 1150  WBC 10.5 10.4 9.7  HGB 9.1* 8.9* 9.0*  HCT 29.3* 28.9* 29.5*  PLT 162 162 147*     Recent Labs  04/05/13 0503  04/06/13 0440 04/06/13 1430 04/07/13 1150  NA 152*  --  152*  --  158*  K 2.9*  < > 2.9* 3.3* 3.3*  CL 115*  --  112  --  119*  CO2 26  --  26  --  28  GLUCOSE 109*  --  114*  --  126*  BUN 11  --  11  --  11  CREATININE 0.77  --  0.78  --  0.76  CALCIUM 8.2*  --  8.3*  --  8.7  MG  --   --   --  2.2  --   < > = values in this interval not displayed. Estimated Creatinine Clearance: 85.2 ml/min (by C-G formula based on Cr of 0.76).    Recent Labs  04/06/13 1605 04/06/13 2106 04/07/13 0634  GLUCAP 130* 92 114*    Insulin Requirements in the past 24 hours:  1 unit of Novolog  Current Nutrition:  Dysphagia 1 diet with poor PO intake documented  Assessment: Admit: Transferred from [facility] with abdominal pain, partial SBO, ileus.  GI: resolving partial SBO; Fall 2014 - volvulus, multiple abd surgeries, fistula development, was on TPN long-term but recently stopped and was tolerating PO intake per report.  Now with poor PO intake on Dysphagia 1 diet.  Per MD will use TPN for support  until PEG can be safely placed (on Xarelto).  Endo: CBGs well controlled on SSI  Lytes: Na 158 d/t dehydration; K 3.3 - receiving KCl liquid, Mag 2.2  Renal: SCr 0.76 - stable  Pulm: Trach collar  Cards: Afib - BP ok, tachycardia noted; increasing diltiazem, on Xarelto  Hepatobil: last LFTs were normal, Albumin 2.3  Neuro: A&O  ID: completing a 7d course of ABX for HCAP 3/26  Best Practices: Xarelto for atrial fibrillation  TPN Access: PICC placed 3/22  TPN day#: 1  Nutritional Goals:  2050-2250 kCal, 110-120 grams of protein per day  Plan:  Start Clinimix E 5/15 at 40 ml/hr + 20% lipid emulsion at 10 ml/hr.  This will provide 1181 kCal and 48g protein per day. Continue CBGs and SSI TID with meals Follow PO intake and potential PEG placement for enteral nutrition Check TPN labs 3/27  Estella HuskMichelle Jerricka Carvey, Pharm.D., BCPS, AAHIVP Clinical Pharmacist Phone: [phone] or [phone] 04/07/2013, 2:26 PM

## 2013-04-07 NOTE — Progress Notes (Signed)
Physical Therapy Treatment Patient Details Name: Tim Short MRN: [id number] DOB: 07/21/1934 Today's Date: 04/07/2013    History of Present Illness 79 yo male with recent complications at [facility] after an ablation for afib in oct 2014 he developed a volvulus after the ablation was intubated/trachedm required multiple abd surgeries then developed fistula details unknown.  Has been on tpn, with ngt on/off since. Is at [facility] sent here for complaints of abd pain.  Pt was taken off tpn and feeding tube was removed last week.  He has been eating by mouth and doing well.  But last 4 days with worsening abd pain, no n/v/d.  No fevers (however is was started on vanc and zosyn for unknown reasons).  Pt na level is elevated, and appears dehydrated.  Pt appears comfortable.    PT Comments    Limited by dizziness and s/s of vertigo.  Otherwise pt should be able to do well with pregait and start progressive ambulation.  Follow Up Recommendations  SNF     Equipment Recommendations  Other (comment) (TBA at final venue before home)    Recommendations for Other Services       Precautions / Restrictions Precautions Precautions: Fall Restrictions Other Position/Activity Restrictions: Got dizzy sitting up EOB with some room spinning    Mobility  Bed Mobility Overal bed mobility: Needs Assistance Bed Mobility: Supine to Sit;Sit to Supine     Supine to sit: Mod assist Sit to supine: Mod assist   General bed mobility comments: cues for sequencing , significant truncal assist  Transfers Overall transfer level: Needs assistance   Transfers: Sit to/from Stand Sit to Stand: Mod assist;+2 physical assistance (times 2)         General transfer comment: 2 person transfer for safety.  significant forward and lifting assist  Ambulation/Gait                 Stairs            Wheelchair Mobility    Modified Rankin (Stroke Patients Only)       Balance Overall balance  assessment: Needs assistance Sitting-balance support: Bilateral upper extremity supported Sitting balance-[name] Scale: Poor Sitting balance - Comments: tendency to fall backward with slope of the mattress   Standing balance support: Bilateral upper extremity supported Standing balance-[name] Scale: Poor                      Cognition Arousal/Alertness: Awake/alert Behavior During Therapy: WFL for tasks assessed/performed Overall Cognitive Status: Within Functional Limits for tasks assessed                      Exercises      General Comments General comments (skin integrity, edema, etc.): Vertigo seems to be a major complicating factor overthe last 2 sessions.  I thought that it might be BP at first, but BP sitting was 126/70      Pertinent Vitals/Pain BP 126/70    Home Living                      Prior Function            PT Goals (current goals can now be found in the care plan section) Acute Rehab PT Goals Patient Stated Goal: To get back home PT Goal Formulation: With patient Time For Goal Achievement: 04/19/13 Potential to Achieve Goals: Good Progress towards PT goals: Progressing toward goals    Frequency  Min 3X/week    PT Plan Current plan remains appropriate    End of Session Equipment Utilized During Treatment: Oxygen (down to 28%) Activity Tolerance: Patient tolerated treatment well;Other (comment) (limited by dizziness like passing out sitting EOB) Patient left: in bed;with call bell/phone within reach     Time: 1201-1224 PT Time Calculation (min): 23 min  Charges:  $Therapeutic Activity: 23-37 mins                    G Codes:      Tim Short, Tim Short 04/07/2013, 2:14 PM 04/07/2013  Tim Short, PT [phone]  (pager)

## 2013-04-07 NOTE — Progress Notes (Signed)
SLP Cancellation Note  Patient Details Name: Rush BarerJames B Gunnoe MRN: [id number] DOB: 01/21/1934   Cancelled treatment:       Reason Eval/Treat Not Completed: Medical issues which prohibited therapy. Pt arrived to radiology c/o severe nausea. Does not want test today.    Vanden Fawaz, Riley NearingBonnie Caroline 04/07/2013, 12:48 PM

## 2013-04-07 NOTE — Progress Notes (Signed)
Subjective: Pt feeling all right today, no changes since yesterday.  Tolerating a diet, denies any worsening abdominal pain.   Objective: Vital signs in last 24 hours: Temp:  [98.3 F (36.8 C)-98.4 F (36.9 C)] 98.3 F (36.8 C) (03/26 0451) Pulse Rate:  [119-131] 121 (03/26 0451) Resp:  [18-22] 18 (03/26 0451) BP: (109-118)/(65-80) 114/65 mmHg (03/26 0451) SpO2:  [93 %-100 %] 100 % (03/26 0451) FiO2 (%):  [28 %] 28 % (03/26 0451) Last BM Date: 04/04/13  Intake/Output from previous day: 03/25 0701 - 03/26 0700 In: 120 [P.O.:120] Out: 902 [Urine:900; Stool:2] Intake/Output this shift:    PE: Gen:  Alert, NAD, pleasant Cardio: RRR, holosytolic +2 murmur at apex  Abd: Soft, NT/ND, +BS, no HSM, midline wound intact, healing, RLQ/flank wound clean   Lab Results:   Recent Labs  04/05/13 0503 04/06/13 0440  WBC 10.5 10.4  HGB 9.1* 8.9*  HCT 29.3* 28.9*  PLT 162 162   BMET  Recent Labs  04/05/13 0503  04/06/13 0440 04/06/13 1430  NA 152*  --  152*  --   K 2.9*  < > 2.9* 3.3*  CL 115*  --  112  --   CO2 26  --  26  --   GLUCOSE 109*  --  114*  --   BUN 11  --  11  --   CREATININE 0.77  --  0.78  --   CALCIUM 8.2*  --  8.3*  --   < > = values in this interval not displayed. PT/INR No results found for this basename: LABPROT, INR,  in the last 72 hours CMP     Component Value Date/Time   NA 152* 04/06/2013 0440   K 3.3* 04/06/2013 1430   CL 112 04/06/2013 0440   CO2 26 04/06/2013 0440   GLUCOSE 114* 04/06/2013 0440   BUN 11 04/06/2013 0440   CREATININE 0.78 04/06/2013 0440   CALCIUM 8.3* 04/06/2013 0440   PROT 5.6* 03/28/2013 1831   ALBUMIN 2.3* 03/28/2013 1831   AST 26 03/28/2013 1831   ALT 30 03/28/2013 1831   ALKPHOS 67 03/28/2013 1831   BILITOT 0.4 03/28/2013 1831   GFRNONAA 85* 04/06/2013 0440   GFRAA >90 04/06/2013 0440   Lipase     Component Value Date/Time   LIPASE 30 03/28/2013 1831       Studies/Results: No results  found.  Anti-infectives: Anti-infectives   Start     Dose/Rate Route Frequency Ordered Stop   04/05/13 1200  levofloxacin (LEVAQUIN) tablet 750 mg     750 mg Oral Daily 04/05/13 0945     04/01/13 1230  vancomycin (VANCOCIN) 1,500 mg in sodium chloride 0.9 % 500 mL IVPB  Status:  Discontinued     1,500 mg 250 mL/hr over 120 Minutes Intravenous Every 24 hours 04/01/13 1150 04/05/13 0945   04/01/13 1030  ceFEPIme (MAXIPIME) 1 g in dextrose 5 % 50 mL IVPB  Status:  Discontinued     1 g 100 mL/hr over 30 Minutes Intravenous Every 8 hours 04/01/13 1011 04/05/13 0945   03/29/13 1000  vancomycin (VANCOCIN) 1,500 mg in sodium chloride 0.9 % 500 mL IVPB  Status:  Discontinued     1,500 mg 250 mL/hr over 120 Minutes Intravenous Daily 03/29/13 0245 03/29/13 1117   03/29/13 0600  piperacillin-tazobactam (ZOSYN) IVPB 3.375 g  Status:  Discontinued     3.375 g 12.5 mL/hr over 240 Minutes Intravenous 3 times per day 03/29/13 0301 03/29/13 1117  03/29/13 0245  piperacillin-tazobactam (ZOSYN) injection 3.375 g  Status:  Discontinued     3.375 g Intramuscular Every 6 hours 03/29/13 0245 03/29/13 0300       Assessment/Plan 79 y/o with previous Right 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 Pt had recent fistula takedown and cholecystectomy performed at [facility].  Was recently at [facility]. S/P multiple abdominal procedures at outside hospital - WD changes to RLQ wound, dry dressing to midline wound.  Surgery will consider s/o and can re consult as necessary  Ileus - resolved Abdominal pain - Resolving, continue to follow  Hypokalemia - primary is supplementing Leukocytosis resolved  Hypernatremia - Most likely hypertonic hypovolemic hypernatremia secondary to increase H2O loss.  Per primary, would increase free water access  VTE - SCD's and not currently on pharm DVT  proph    LOS: 10 days    Gildardo Cranker 04/07/2013, 7:15 AM Pager: [phone]

## 2013-04-07 NOTE — Progress Notes (Addendum)
    Subjective:  Denies CP or dyspnea; complains of palpitations; mild abdominal pain (improved).   He is per hospitalists getting close to discharge.  Objective:  Filed Vitals:   04/06/13 2028 04/07/13 0004 04/07/13 0334 04/07/13 0451  BP:    114/65  Pulse: 124 122 119 121  Temp:    98.3 F (36.8 C)  TempSrc:    Oral  Resp: 18 18 18 18   Height:      Weight:      SpO2: 100% 97% 95% 100%    Intake/Output from previous day:  Intake/Output Summary (Last 24 hours) at 04/07/13 0752 Last data filed at 04/07/13 0452  Gross per 24 hour  Intake    120 ml  Output    902 ml  Net   -782 ml    Physical Exam: Physical exam: Well-developed chronically ill appearing in no acute distress.  Skin is warm and dry.  OP) clear Neck is supple.  Chest with mildly diminished BS bases Cardiovascular exam is tachycardic and irregular Abdominal exam s/p abdominal surgery Extremities show no edema. neuro grossly intact   Lab Results: Basic Metabolic Panel:  Recent Labs  16/10/9601/24/15 0503  04/06/13 0440 04/06/13 1430  NA 152*  --  152*  --   K 2.9*  < > 2.9* 3.3*  CL 115*  --  112  --   CO2 26  --  26  --   GLUCOSE 109*  --  114*  --   BUN 11  --  11  --   CREATININE 0.77  --  0.78  --   CALCIUM 8.2*  --  8.3*  --   MG  --   --   --  2.2  < > = values in this interval not displayed. CBC:  Recent Labs  04/05/13 0503 04/06/13 0440  WBC 10.5 10.4  HGB 9.1* 8.9*  HCT 29.3* 28.9*  MCV 97.0 97.3  PLT 162 162     Assessment/Plan:  1 atrial flutter- patient remains in atypical atrial flutter. Amiodarone has been started by Dr Graciela HusbandsKlein for rate control.  I have switched pradaxa to xarelto for better absorption and smaller pill size (he was having trouble swallowing pradaxa).  Continue diltiazem to 240mg  BID  Continue amiodarone.   Will plan cardioversion once stable.  We could do this prior to discharge though it would also be reasonable to return electively.  We need to make sure that  no other procedures are planned as cardioversion would require uninterupted anticoagulation for at least 4 weeks post procedure 2 Coronary artery disease-continue statin. 3 ileus-management per general surgery. 4 hypertension-continue present medications. 5.hypokalemia- primary team to manage  Hillis RangeJames Costa Jha MD

## 2013-04-07 NOTE — Progress Notes (Signed)
OT Cancellation Note  Patient Details Name: Tim Short MRN: [id number] DOB: 07/26/1934   Cancelled Treatment:    Reason Eval/Treat Not Completed: Other (comment). Pt c/o severe dizziness with any movement, got up to EOB and stood x2 with PT earlier today. Pt says he cannot get up again today due his dizziness. Will follow up tomorrow.   Evette GeorgesLeonard, Reva Pinkley Eva [phone] 04/07/2013, 2:21 PM

## 2013-04-07 NOTE — Progress Notes (Signed)
Not much appetite but ate a little. No change in mild abdominal pain. Minimal tenderness. Noted abd X-rays ordered - will F/U. Patient examined and I agree with the assessment and plan  Violeta GelinasBurke Tuwanda Vokes, MD, MPH, FACS Trauma: [phone] General Surgery: [phone]  04/07/2013 3:15 PM

## 2013-04-07 NOTE — Progress Notes (Signed)
Patient ID: Tim Short, male   DOB: 09/23/1934, 79 y.o.   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 Pt was in prolonged [facility], unable to wean of the vent and requiring trach placement 10/29/2012. He was transferred to [facility] in December 2014 and later to [facility] in late January 2015. He was on TNA and was doing fairly well initially, but developed more abdominal pain and on 2/23 taken to OR in [place] for gallbladder removal (secondary to cholecystitis), resection for part of the colon for fistula, IVC filter placement. Sent back to [facility] on march 4th, 2015 and started on TPN. Diet advanced on March 9th, 2015 after pt passed swallow evaluation. [facility], after noticing progressive abdominal distension. In ED, CT abdomen with air- fluid level and findings consistent with early partial SBO, ileus. Surgery consulted and [facility] asked to admit for further evaluation.   A/P Abdominal pain  - secondary to ileus, partial SBO. Patient with worsening abdominal pain this morning, nausea. He is not able to eat and not tolerating food, and consumed 0% of his lunch. Appreciate surgery input. Will repeat abdominal XR today.  - continue supportive care with analgesia, antiemetics as needed for symptom control. - nutrition consulted, appreciate input, patient is not meeting his nutritional needs at this point based on oral intake.  - patient with intermittent confusion and on anticoagulation for his A fib, will hold off PEG tube placement. Will have to start TPN, per pharmacy.   - continue oral intake as tolerated.  Atrial fibrillation  - rate in 120's - 130's  - Cardizem dose increased from 240 --> 360 mg PO QD, Pradaxa changed to Xarelto (3/23)  - appreciate cardiology input  - will need cardioversion once medically more stable, appreciate cardiology input regarding timing Leukocytosis  - likely secondary to ileus, ? HCAP as suggestive per CXR 3/20  - CT abd/pelvis with mild leak around anastomosis but no other acute events  - WBC is trending down and within normal limits in the past 2 days.  HCAP - was on vancomycin and Maxipime for 5 days to cover for HCAP and transitioned to Levaquin 3/24 for 2 more days to complete 7 days therapy. Last dose 3/26.  Pulmonary vascular congestion - lung exam improving.  - IVF stopped 3/20 due to pulmonary vascular congestion  - monitor I's/O's, daily weights  - weight trend: 203 lbs --> 195 3/24, daily weights Hypokalemia - will continue to supplement via IV route and repeat BMP in AM  - check magnesium Sacral ulcer - skin breakdown noted, closely monitor.  Anemia of chronic disease  - Hg stable over the past 24 hours  - no signs of active bleeding  Essential hypertension - within normal limits today - close monitoring  Tracheostomy status - suctioning as needed  Hypernatremia - worsening today in light of almost no po intake.  Moderate malnutrition  - secondary to acute on chronic illness as outlined above  - per SLP, pudding thick liquids allowed  - pt tolerating well however does not seem to meet his nutritional requirements. - start TPN today.  Consultants:  Surgery  Cardiology  Procedures/Studies:  Ct Abdomen Pelvis W Contrast 03/28/2013 Findings consistent with persistent phlegmon in the liver. Interim removal of right upper quadrant drainage catheter. Colonic distention with air-fluid levels. Slight progression of soft tissue fluid in the anterior abdomen in the region the patient's scarring. 13 mm low-density  lesion left kidney, not definite simple cyst.  Dg Abd 2 Views 03/30/2013 Mildly dilated loops of left upper quadrant small bowel with scattered internal air-fluid levels, similar to the prior exam, with abnormal but nonspecific pattern which could be due to could ileus or partial SBO.  Dg Abd 2 View 03/29/2013 Nonspecific bowel gas pattern, as above, suggestive of partial small bowel obstruction. No pneumoperitoneum.  Dg Abd Acute W/chest 03/28/2013 Persistent small bowel dilatation compatible with either partial SBO or ileus. No change in aeration to the lungs compared with previous exam. Antibiotics:  Vancomycin 3/20 --> 3/24 Maxipime 3/20 --> 3/24 Levaquin 3/24 --> 3/26  Code Status: Full  Family Communication: Son Onalee Hua bedside  Disposition Plan: Remains inpatient, LTACH vs SNF  HPI/Subjective: No events overnight, alert this morning, appropriate  Objective: Filed Vitals:   04/07/13 0334 04/07/13 0451 04/07/13 0822 04/07/13 1229  BP:  114/65    Pulse: 119 121 113 124  Temp:  98.3 F (36.8 C)    TempSrc:  Oral    Resp: 18 18 20 20   Height:      Weight:      SpO2: 95% 100% 100% 94%    Intake/Output Summary (Last 24 hours) at 04/07/13 1234 Last data filed at 04/07/13 0800  Gross per 24 hour  Intake    100 ml  Output    551 ml  Net   -451 ml   Exam:  General:  Pt is alert, follows commands appropriately, not in acute distress, trach in place   Cardiovascular: Irregular rhythm, tachycardic, S1/S2, no murmurs, no rubs, no gallops  Respiratory: Clear to auscultation bilaterally, no wheezing, mild bibasilar rhonchi  Abdomen: Soft, non tender, non distended, bowel sounds present, no guarding  Extremities: No edema, pulses DP and PT palpable bilaterally  Data Reviewed: Basic Metabolic Panel:  Recent Labs Lab 04/03/13 0505 04/04/13 0412 04/04/13 0620 04/05/13 0503 04/05/13 2225 04/06/13 0440 04/06/13 1430  NA 148* 150* 154* 152*  --  152*  --   K 3.3* 2.8* 2.7*  2.9* 3.1* 2.9* 3.3*  CL 110 112 117* 115*  --  112  --   CO2 25 26 25 26   --  26  --   GLUCOSE 105* 120* 115* 109*  --  114*  --   BUN 13 13 12 11   --  11  --   CREATININE 0.92 0.83 0.75 0.77  --  0.78  --   CALCIUM 8.4 8.3* 7.6* 8.2*  --  8.3*  --   MG  --   --   --   --   --   --  2.2   CBC:  Recent Labs Lab 04/03/13 0505 04/04/13 0412 04/05/13 0503 04/06/13 0440 04/07/13 1150  WBC 14.0* 11.9* 10.5 10.4 9.7  HGB 9.8* 9.1* 9.1* 8.9* 9.0*  HCT 32.2* 29.4* 29.3* 28.9* 29.5*  MCV 96.1 95.8 97.0 97.3 98.7  PLT 198 191 162 162 147*   CBG:  Recent Labs Lab 04/06/13 0615 04/06/13 1123 04/06/13 1605 04/06/13 2106 04/07/13 0634  GLUCAP 114* 119* 130* 92 114*   Recent Results (from the past 240 hour(s))  MRSA PCR SCREENING  Status: None   Collection Time    03/29/13  3:00 AM      Result Value Ref Range Status   MRSA by PCR NEGATIVE  NEGATIVE Final   Comment:            The GeneXpert MRSA Assay (FDA     approved for NASAL specimens     only), is one component of a     comprehensive MRSA colonization     surveillance program. It is not     intended to diagnose MRSA     infection nor to guide or     monitor treatment for     MRSA infections.  URINE CULTURE     Status: None   Collection Time    03/29/13  6:50 AM      Result Value Ref Range Status   Specimen Description URINE, RANDOM   Final   Special Requests NONE   Final   Culture  Setup Time     Final   Value: 03/29/2013 12:43     Performed at [facility] Count     Final   Value: NO GROWTH     Performed at [facility]   Culture     Final   Value: NO GROWTH     Performed at [facility]   Report Status 03/30/2013 FINAL   Final  CULTURE, BLOOD (ROUTINE X 2)     Status: None   Collection Time    04/01/13 10:55 AM      Result Value Ref Range Status   Specimen Description BLOOD LEFT HAND   Final   Special Requests BOTTLES DRAWN AEROBIC AND ANAEROBIC 5CC   Final   Culture   Setup Time     Final   Value: 04/01/2013 14:39     Performed at [facility]   Culture     Final   Value: NO GROWTH 5 DAYS     Performed at [facility]   Report Status 04/07/2013 FINAL   Final  CULTURE, BLOOD (ROUTINE X 2)     Status: None   Collection Time    04/01/13 11:05 AM      Result Value Ref Range Status   Specimen Description BLOOD RIGHT HAND   Final   Special Requests BOTTLES DRAWN AEROBIC AND ANAEROBIC 5CC   Final   Culture  Setup Time     Final   Value: 04/01/2013 14:39     Performed at [facility]   Culture     Final   Value: NO GROWTH 5 DAYS     Performed at [facility]   Report Status 04/07/2013 FINAL   Final  CULTURE, RESPIRATORY (NON-EXPECTORATED)     Status: None   Collection Time    04/01/13  3:25 PM      Result Value Ref Range Status   Specimen Description TRACHEAL ASPIRATE   Final   Special Requests NONE   Final   Gram Stain     Final   Value: FEW WBC PRESENT,BOTH PMN AND MONONUCLEAR     NO SQUAMOUS EPITHELIAL CELLS SEEN     FEW GRAM POSITIVE RODS     RARE YEAST     Performed at [facility]   Culture     Final   Value: FEW KLEBSIELLA PNEUMONIAE     Performed at [facility]   Report Status 04/03/2013 FINAL   Final  Organism ID, Bacteria KLEBSIELLA PNEUMONIAE   Final  CULTURE, RESPIRATORY (NON-EXPECTORATED)     Status: None   Collection Time    04/02/13  8:34 AM      Result Value Ref Range Status   Specimen Description TRACHEAL ASPIRATE   Final   Special Requests NONE   Final   Gram Stain     Final   Value: ABUNDANT WBC PRESENT, PREDOMINANTLY PMN     MODERATE SQUAMOUS EPITHELIAL CELLS PRESENT     FEW GRAM POSITIVE COCCI IN PAIRS     IN CLUSTERS IN CHAINS FEW GRAM NEGATIVE COCCI     Performed at [facility]   Culture     Final   Value: Non-Pathogenic Oropharyngeal-type Flora Isolated.     Performed at [facility]   Report Status 04/04/2013 FINAL   Final    Scheduled Meds: .  amiodarone  400 mg Oral BID  . atorvastatin  40 mg Oral Daily  . chlorhexidine  15 mL Mouth/Throat BID  . diltiazem  240 mg Oral BID  . feeding supplement (ENSURE)  1 Container Oral TID BM  . insulin aspart  0-9 Units Subcutaneous TID WC  . levofloxacin  750 mg Oral Daily  . pantoprazole  40 mg Oral Daily  . potassium chloride  40 mEq Oral Once  . predniSONE  5 mg Oral Q breakfast  . rivaroxaban  20 mg Oral Q supper  . roflumilast  500 mcg Oral Daily  . sodium chloride  10-40 mL Intracatheter Q12H  . sodium chloride  3 mL Intravenous Q12H       Pamella Pert, MD  Pager [phone] Time spent: 35 minutes If 7PM-7AM, please contact night-coverage [email or web address] Password [facility] 04/07/2013, 12:34 PM   LOS: 10 days

## 2013-04-08 ENCOUNTER — Inpatient Hospital Stay (HOSPITAL_COMMUNITY): Payer: Medicare HMO | Admitting: Anesthesiology

## 2013-04-08 ENCOUNTER — Other Ambulatory Visit: Payer: Self-pay

## 2013-04-08 ENCOUNTER — Encounter (HOSPITAL_COMMUNITY): Payer: Self-pay | Admitting: Anesthesiology

## 2013-04-08 ENCOUNTER — Encounter (HOSPITAL_COMMUNITY): Payer: Medicare HMO | Admitting: Anesthesiology

## 2013-04-08 ENCOUNTER — Encounter (HOSPITAL_COMMUNITY)
Admission: EM | Disposition: A | Discharge status: Skilled Nursing Facility | Payer: Self-pay | Source: Home / Self Care | Attending: Critical Care Medicine

## 2013-04-08 DIAGNOSIS — I4892 Unspecified atrial flutter: Secondary | ICD-10-CM

## 2013-04-08 HISTORY — PX: CARDIOVERSION: SHX1299

## 2013-04-08 LAB — COMPREHENSIVE METABOLIC PANEL
ALBUMIN: 2.3 g/dL — AB (ref 3.5–5.2)
ALT: 39 U/L (ref 0–53)
AST: 34 U/L (ref 0–37)
Alkaline Phosphatase: 70 U/L (ref 39–117)
BUN: 13 mg/dL (ref 6–23)
CALCIUM: 8.6 mg/dL (ref 8.4–10.5)
CO2: 28 mEq/L (ref 19–32)
Chloride: 118 mEq/L — ABNORMAL HIGH (ref 96–112)
Creatinine, Ser: 0.76 mg/dL (ref 0.50–1.35)
GFR calc Af Amer: >90 mL/min (ref >90)
GFR calc non Af Amer: 86 mL/min — ABNORMAL LOW (ref >90)
Glucose, Bld: 161 mg/dL — ABNORMAL HIGH (ref 70–99)
Potassium: 3 mEq/L — ABNORMAL LOW (ref 3.7–5.3)
SODIUM: 155 meq/L — AB (ref 137–147)
Total Bilirubin: 0.3 mg/dL (ref 0.3–1.2)
Total Protein: 4.9 g/dL — ABNORMAL LOW (ref 6.0–8.3)

## 2013-04-08 LAB — DIFFERENTIAL
Basophils Absolute: 0 10*3/uL (ref 0.0–0.1)
Basophils Relative: 0 % (ref 0–1)
EOS PCT: 1 % (ref 0–5)
Eosinophils Absolute: 0.1 10*3/uL (ref 0.0–0.7)
LYMPHS PCT: 13 % (ref 12–46)
Lymphs Abs: 1.1 10*3/uL (ref 0.7–4.0)
Monocytes Absolute: 0.7 10*3/uL (ref 0.1–1.0)
Monocytes Relative: 8 % (ref 3–12)
NEUTROS PCT: 78 % — AB (ref 43–77)
Neutro Abs: 6.9 10*3/uL (ref 1.7–7.7)

## 2013-04-08 LAB — GLUCOSE, CAPILLARY
GLUCOSE-CAPILLARY: 167 mg/dL — AB (ref 70–99)
GLUCOSE-CAPILLARY: 183 mg/dL — AB (ref 70–99)
Glucose-Capillary: 140 mg/dL — ABNORMAL HIGH (ref 70–99)
Glucose-Capillary: 137 mg/dL — ABNORMAL HIGH (ref 70–99)

## 2013-04-08 LAB — CBC
HCT: 27.9 % — ABNORMAL LOW (ref 39.0–52.0)
Hemoglobin: 8.6 g/dL — ABNORMAL LOW (ref 13.0–17.0)
MCH: 30.5 pg (ref 26.0–34.0)
MCHC: 30.8 g/dL (ref 30.0–36.0)
MCV: 98.9 fL (ref 78.0–100.0)
Platelets: 130 10*3/uL — ABNORMAL LOW (ref 150–400)
RBC: 2.82 MIL/uL — AB (ref 4.22–5.81)
RDW: 21.4 % — ABNORMAL HIGH (ref 11.5–15.5)
WBC: 8.8 10*3/uL (ref 4.0–10.5)

## 2013-04-08 LAB — TRIGLYCERIDES: TRIGLYCERIDES: 122 mg/dL (ref <150)

## 2013-04-08 LAB — PHOSPHORUS: Phosphorus: 2.1 mg/dL — ABNORMAL LOW (ref 2.3–4.6)

## 2013-04-08 LAB — PREALBUMIN: Prealbumin: 18.1 mg/dL (ref 17.0–34.0)

## 2013-04-08 LAB — MAGNESIUM: MAGNESIUM: 2.1 mg/dL (ref 1.5–2.5)

## 2013-04-08 SURGERY — CARDIOVERSION
Anesthesia: General

## 2013-04-08 MED ORDER — SODIUM CHLORIDE 0.9 % IJ SOLN
3.0000 mL | Freq: Two times a day (BID) | INTRAMUSCULAR | Status: DC
  Administered 2013-04-10 – 2013-04-14 (×4): 3 mL via INTRAVENOUS

## 2013-04-08 MED ORDER — SODIUM CHLORIDE 0.9 % IV SOLN: 250.0000 mL | INTRAVENOUS | Status: DC

## 2013-04-08 MED ORDER — PHENYLEPHRINE HCL 10 MG/ML IJ SOLN
INTRAMUSCULAR | Status: DC | PRN
  Administered 2013-04-08: 80 ug via INTRAVENOUS
  Administered 2013-04-08: 180 ug via INTRAVENOUS

## 2013-04-08 MED ORDER — HYDROCORTISONE 1 % EX CREA
TOPICAL_CREAM | Freq: Two times a day (BID) | CUTANEOUS | Status: DC
  Administered 2013-04-08 – 2013-04-12 (×9): via TOPICAL
  Administered 2013-04-13: 1 via TOPICAL
  Administered 2013-04-13 – 2013-04-15 (×4): via TOPICAL
  Administered 2013-04-15: 1 via TOPICAL
  Administered 2013-04-16 – 2013-04-24 (×18): via TOPICAL
  Administered 2013-04-25: 1 via TOPICAL
  Administered 2013-04-25 – 2013-05-01 (×13): via TOPICAL
  Administered 2013-05-02: 1 via TOPICAL
  Administered 2013-05-02: 21:00:00 via TOPICAL
  Administered 2013-05-03: 1 via TOPICAL
  Administered 2013-05-03 – 2013-05-04 (×2): via TOPICAL
  Filled 2013-04-08 (×3): qty 28

## 2013-04-08 MED ORDER — PROPOFOL 10 MG/ML IV BOLUS
INTRAVENOUS | Status: AC
  Filled 2013-04-08: qty 20

## 2013-04-08 MED ORDER — TRACE MINERALS CR-CU-F-FE-I-MN-MO-SE-ZN IV SOLN
INTRAVENOUS | Status: AC
  Administered 2013-04-08: 17:00:00 via INTRAVENOUS
  Filled 2013-04-08: qty 2000

## 2013-04-08 MED ORDER — POTASSIUM PHOSPHATE DIBASIC 3 MMOLE/ML IV SOLN
20.0000 mmol | Freq: Once | INTRAVENOUS | Status: AC
  Administered 2013-04-08: 20 mmol via INTRAVENOUS
  Filled 2013-04-08: qty 6.67

## 2013-04-08 MED ORDER — SODIUM CHLORIDE 0.9 % IJ SOLN: 3.0000 mL | INTRAMUSCULAR | Status: DC | PRN

## 2013-04-08 MED ORDER — PROPOFOL 10 MG/ML IV BOLUS
INTRAVENOUS | Status: DC | PRN
  Administered 2013-04-08: 110 mg via INTRAVENOUS

## 2013-04-08 MED ORDER — FAT EMULSION 20 % IV EMUL
250.0000 mL | INTRAVENOUS | Status: AC
  Administered 2013-04-08: 250 mL via INTRAVENOUS
  Filled 2013-04-08: qty 250

## 2013-04-08 MED ORDER — SODIUM CHLORIDE 0.9 % IV SOLN
INTRAVENOUS | Status: DC | PRN
  Administered 2013-04-08: 10:00:00 via INTRAVENOUS

## 2013-04-08 MED ORDER — DILTIAZEM HCL ER 240 MG PO CP24
240.0000 mg | ORAL_CAPSULE | Freq: Every day | ORAL | Status: DC
  Administered 2013-04-09 – 2013-04-13 (×5): 240 mg via ORAL
  Filled 2013-04-08 (×6): qty 1

## 2013-04-08 MED ORDER — POTASSIUM CHLORIDE CRYS ER 20 MEQ PO TBCR
40.0000 meq | EXTENDED_RELEASE_TABLET | Freq: Once | ORAL | Status: AC
  Administered 2013-04-08: 40 meq via ORAL
  Filled 2013-04-08: qty 2

## 2013-04-08 MED ORDER — LIDOCAINE HCL (CARDIAC) 20 MG/ML IV SOLN
INTRAVENOUS | Status: AC
  Filled 2013-04-08: qty 5

## 2013-04-08 MED ORDER — ALPRAZOLAM 0.25 MG PO TABS
0.2500 mg | ORAL_TABLET | Freq: Once | ORAL | Status: AC | PRN
  Administered 2013-04-08: 0.25 mg via ORAL
  Filled 2013-04-08: qty 1

## 2013-04-08 NOTE — Preoperative (Signed)
Beta Blockers   Reason not to administer Beta Blockers:Not Applicable 

## 2013-04-08 NOTE — Progress Notes (Signed)
Pt expressing frustrations and being "on the verge of tears" due to incessant "background noise."  Only noise in room is oxygen and suction, but Pt insists he's hearing something else.  Emotional support given and MD notified.

## 2013-04-08 NOTE — Progress Notes (Signed)
Nothing to do surgically.  Tolerating diet.  Will be around for questions/concerns.

## 2013-04-08 NOTE — Transfer of Care (Signed)
Immediate Anesthesia Transfer of Care Note  Patient: Tim Short  Procedure(s) Performed: Procedure(s): CARDIOVERSION    (BEDSIDE)  (N/A)  Patient Location: Nursing Unit  Anesthesia Type:General  Level of Consciousness: awake, alert  and oriented  Airway & Oxygen Therapy: Patient Spontanous Breathing and Patient connected to tracheostomy mask oxygen  Post-op Assessment: Report given to PACU RN and Post -op Vital signs reviewed and stable  Post vital signs: Reviewed and stable  Complications: No apparent anesthesia complications

## 2013-04-08 NOTE — Progress Notes (Signed)
PARENTERAL NUTRITION CONSULT NOTE - FOLLOW UP  Pharmacy Consult for TPN Indication: inadequate PO intake, resolving SBO  Allergies  Allergen Reactions  . Corticosteroids     Inhaled Corticosteroids--Hoarseness, dry mouth  . Furosemide Other (See Comments)    Ototoxicity     Patient Measurements: Height: 5' 8.9" (175 cm) Weight: 192 lb 7.4 oz (87.3 kg) IBW/kg (Calculated) : 70.47   Vital Signs: Temp: 97.9 F (36.6 C) (03/27 0845) Temp src: Oral (03/27 0845) BP: 128/68 mmHg (03/27 1200) Pulse Rate: 78 (03/27 1100) Intake/Output from previous day: 03/26 0701 - 03/27 0700 In: 100 [P.O.:100] Out: 100 [Urine:100] Intake/Output from this shift: Total I/O In: 200 [I.V.:200] Out: -   Labs:  Recent Labs  04/06/13 0440 04/07/13 1150 04/08/13 0500  WBC 10.4 9.7 8.8  HGB 8.9* 9.0* 8.6*  HCT 28.9* 29.5* 27.9*  PLT 162 147* 130*     Recent Labs  04/06/13 0440 04/06/13 1430 04/07/13 1150 04/08/13 0500  NA 152*  --  158* 155*  K 2.9* 3.3* 3.3* 3.0*  CL 112  --  119* 118*  CO2 26  --  28 28  GLUCOSE 114*  --  126* 161*  BUN 11  --  11 13  CREATININE 0.78  --  0.76 0.76  CALCIUM 8.3*  --  8.7 8.6  MG  --  2.2  --  2.1  PHOS  --   --   --  2.1*  PROT  --   --   --  4.9*  ALBUMIN  --   --   --  2.3*  AST  --   --   --  34  ALT  --   --   --  39  ALKPHOS  --   --   --  70  BILITOT  --   --   --  0.3  PREALBUMIN  --   --   --  18.1  TRIG  --   --   --  122   Estimated Creatinine Clearance: 84.4 ml/min (by C-G formula based on Cr of 0.76).    Recent Labs  04/07/13 2038 04/08/13 0631 04/08/13 1115  GLUCAP 149* 140* 137*    Insulin Requirements in the past 24 hours:  1 unit of Novolog  Current Nutrition:  Dysphagia 1 diet with poor PO intake documented TPN with clinimix E 5/15 15 40 ml/hr plus lipids at 10 ml/hr  Assessment: Admit: Transferred from [facility] with abdominal pain, partial SBO, ileus.  GI: resolved ileus per surgery.  Fall 2014 -  volvulus, multiple abd surgeries, fistula development, was on TPN long-term but recently stopped and was tolerating PO intake per report.  Now with poor PO intake on Dysphagia 1 diet.  Per MD will use TPN for support until PEG can be safely placed (on Xarelto). Please see RD note 3/27: recs for short-term enteral support via NGT is appropriate as he has a functioning GI tract  Nutrition: prealbumin 18.1 today, adequate.   Endo: CBGs well controlled on SSI. On PO prednisone  Lytes: Na 155 d/t dehydration; K 3.0 after 40 po yesterday, Mag 2.1, phos 2.1  Renal: SCr 0.76 - stable  Pulm: Trach collar  Cards: Afib - BP ok, tachycardia noted; increasing diltiazem, on Xarelto  Hepatobil: last LFTs were normal, Albumin 2.3  Neuro: A&O  ID: completing a 7d course of ABX for HCAP 3/26  Best Practices: Xarelto for atrial fibrillation  TPN Access: PICC placed 3/22  TPN  day#: 2  Nutritional Goals:  2050-2250 kCal, 110-120 grams of protein per day  Plan:  Increase Clinimix E 5/15 to 60 ml/hr + 20% lipid emulsion at 10 ml/hr.  This will provide 1502  kCal and 72 g protein per day. Continue CBGs and SSI TID with meals Kphos 20 mMol plus kdur 40 po to replace k/phos and check labs in am Follow PO intake and potential PEG placement for enteral nutrition Consider replacing NGT and feeding enterally as his gut works.  Herby AbrahamMichelle T. Layci Stenglein, Pharm.D. [phone] 04/08/2013 12:57 PM

## 2013-04-08 NOTE — Progress Notes (Signed)
NUTRITION FOLLOW UP  INTERVENTION:  Recommend short-term EN support via NGT   TPN per pharmacy Continue Ensure Pudding po TID, each supplement provides 170 kcal and 4 grams of protein RD to follow for nutrition care plan  NUTRITION DIAGNOSIS: Inadequate oral intake now related to dysphagia, poor appetite as evidenced by 0-20%, ongoing  Goal: Pt to meet >/= 90% of their estimated nutrition needs, unmet   Monitor:  TPN prescription, PO intake, weight, labs, I/O's  ASSESSMENT: 79 yo male with recent complications at [facility] after an ablation for afib in Oct 2014 he developed a volvulus after the ablation was intubated/trached; required multiple abd surgeries then developed fistula.   Has been on TPN, with NGT on/off since. Is at [facility] and sent to [facility] for complaints of abd pain. Pt was taken off TPN and NGT was removed last week. He has been eating by mouth and doing well. Last 4 days with worsening abd pain, no N/V/D.  Patient s/p MBSS 3/21.  SLP recommending Dys 1-pudding thick liquid diet.  PO intake very poor; mostly 0% per flowsheet records.      Surgery notes reviewed.  Patient's ileus resolved.  Abdominal pain resolving.  TPN initiated 3/26.  Noted potential PEG tube placement.  Patient is receiving TPN with Clinimix E 5/15 @ 40 ml/hr and lipids @ 10 ml/hr. Provides 1162 kcal and 48 grams protein per day. Meets 56% minimum estimated energy needs and 44% minimum estimated protein needs.  Height: Ht Readings from Last 1 Encounters:  03/30/13 5' 8.9" (1.75 m)    Weight: Wt Readings from Last 1 Encounters:  04/07/13 192 lb 7.4 oz (87.3 kg)    BMI:  Body mass index is 28.51 kg/(m^2).  Re-estimated needs: Kcal: 2050-2250 Protein: 110-120 gm Fluid: 2.0-2.2 L  Skin: Intact  Diet Order: NPO 1, pudding thick liquids   Intake/Output Summary (Last 24 hours) at 04/08/13 1205 Last data filed at 04/08/13 1044  Gross per 24 hour  Intake    200 ml  Output    100 ml  Net     100 ml    Labs:   Recent Labs Lab 04/06/13 0440 04/06/13 1430 04/07/13 1150 04/08/13 0500  NA 152*  --  158* 155*  K 2.9* 3.3* 3.3* 3.0*  CL 112  --  119* 118*  CO2 26  --  28 28  BUN 11  --  11 13  CREATININE 0.78  --  0.76 0.76  CALCIUM 8.3*  --  8.7 8.6  MG  --  2.2  --  2.1  PHOS  --   --   --  2.1*  GLUCOSE 114*  --  126* 161*    CBG (last 3)   Recent Labs  04/07/13 2038 04/08/13 0631 04/08/13 1115  GLUCAP 149* 140* 137*    Scheduled Meds: . amiodarone  400 mg Oral BID  . atorvastatin  40 mg Oral Daily  . chlorhexidine  15 mL Mouth/Throat BID  . diltiazem  240 mg Oral BID  . feeding supplement (ENSURE)  1 Container Oral TID BM  . insulin aspart  0-9 Units Subcutaneous TID WC  . levofloxacin  750 mg Oral Daily  . pantoprazole  40 mg Oral Daily  . potassium chloride  40 mEq Oral Once  . potassium phosphate IVPB (mmol)  20 mmol Intravenous Once  . predniSONE  5 mg Oral Q breakfast  . rivaroxaban  20 mg Oral Q supper  . roflumilast  500 mcg  Oral Daily  . sodium chloride  10-40 mL Intracatheter Q12H  . sodium chloride  3 mL Intravenous Q12H  . sodium chloride  3 mL Intravenous Q12H    Continuous Infusions: . sodium chloride    . Marland Kitchen.TPN (CLINIMIX-E) Adult 40 mL/hr at 04/07/13 1749   And  . fat emulsion 250 mL (04/07/13 1749)    Past Medical History  Diagnosis Date  . Hypertension   . Hypercholesteremia   . Asthma   . Meniere disease   . Persistent atrial fibrillation     a. s/p multiple dccv's;  b. failed amio;  c. not felt to be AF RFCA canddiate due to LA dil;  d. s/p failed hybrid ablation @ [facility] in 09/2012;  e. chronic pradaxa.  . Obesity   . Biatrial enlargement     LA size 5.3cm  . Obstructive sleep apnea     AHI 108/hr now on CPAP at 12cm H2O  . H/O hiatal hernia   . GERD (gastroesophageal reflux disease)     "associated w/hiatal hernia" (06/04/2012)  . Peptic ulcer 1950's  . Migraines     "haven't had one for about 15 years" (06/04/2012)   . Arthritis     "joints" (06/04/2012)  . Chronic lower back pain   . Nephrolithiasis ~ 1960's    "passed on their own" (06/04/2012)  . History of pneumonia 2002; 2006    "spent 8 days in isolation; Norovirus" (06/04/2012)  . Other and unspecified angina pectoris   . RLS (restless legs syndrome)   . Coronary artery disease     a. 05/2012 Cath/PCI: LM nl, LAD nl, LCX 5485m (3.0x15 Integrity BMS), PTCA of OM1 through stent struts (kissing balloon).  . Diabetes mellitus without complication     FAMILY STATES PATIENT IS NOT DIABETIC    Past Surgical History  Procedure Laterality Date  . Wrist fracture surgery  1992    "repaired w/left hip bone graft" (06/04/2012)  . Total knee arthroplasty  08/19/1999  . Colonoscopy w/ polypectomy  2006  . Knee arthroscopy with meniscal repair Right 1974    "medial meniscus repaired" (06/04/2012)  . Cardioversion  10/02/2011    Procedure: CARDIOVERSION;  Surgeon: Corky CraftsJayadeep S. Varanasi, MD;  [facility];  Service: Cardiovascular;  Laterality: N/A;  h/p in file drawer  . Cardioversion  11/07/2011    Procedure: CARDIOVERSION;  Surgeon: Corky CraftsJayadeep S. Varanasi, MD;  [facility];  Service: Cardiovascular;  Laterality: N/A;  h/p from 10/22 in file drawer/dl  . Cardiac catheterization  05/26/2012  . Coronary angioplasty with stent placement  06/04/2012    "1" (06/04/2012)  . Cataract extraction w/ intraocular lens  implant, bilateral  2009  . Hemorrhoid surgery  ~ 2003  . Hiatal hernia repair  1983  . Nissen fundoplication  1983  . Ablation of dysrhythmic focus  SEPT/OCT 2014    ATRIAL FIB  . Inguinal hernia repair Bilateral 2000 and 2005    "one done at a time" (06/04/2012)  . Colon surgery  10/08/2012   . Tracheostomy  OCT 2014    Maureen ChattersKatie Rigoberto Repass, RD, LDN Pager #: [phone] After-Hours Pager #: [phone]

## 2013-04-08 NOTE — CV Procedure (Signed)
Cardioversion:  The patient was well informed. Consent was signed. Anesthesia was present. Anterior posterior pads were placed with a biphasic defibrillator.  The patient received 120 mg of IV propofol.   The patient received 120 J of biphasic energy. He immediately converted to normal sinus rhythm with a rate of 65.  There was a very brief period of decreased blood pressure. This responded rapidly. The patient awoke without difficulty. I requested steroid cream for the areas where the pads were placed.  Successful cardioversion with one shock. Return to sinus rhythm.  Jerral BonitoJeff Katz, MD

## 2013-04-08 NOTE — Progress Notes (Signed)
Patient ID: Tim BarerJames B Overfelt, male   DOB: 08/05/1934, 79 y.o.   MRN: [id number]  There has been careful discussion between Dr. Johney FrameAllred in the primary care team. At this point no further procedures are planned. Therefore we are able to proceed with cardioversion. The patient is n.p.o. the cardioversion is scheduled for today.  Jerral BonitoJeff Cherrise Occhipinti, MD

## 2013-04-08 NOTE — Anesthesia Preprocedure Evaluation (Addendum)
Anesthesia Evaluation  Patient identified by MRN, date of birth, ID band Patient awake    Reviewed: Allergy & Precautions, H&P , NPO status , Patient's Chart, lab work & pertinent test results  Airway      Comment: Tracheostomy Dental no notable dental hx. (+) Poor Dentition, Dental Advisory Given   Pulmonary asthma , sleep apnea , former smoker,  breath sounds clear to auscultation  Pulmonary exam normal       Cardiovascular hypertension, + CAD + dysrhythmias Atrial Fibrillation Rhythm:irregular Rate:Normal     Neuro/Psych negative neurological ROS  negative psych ROS   GI/Hepatic Neg liver ROS, hiatal hernia, PUD, GERD-  Medicated and Controlled,  Endo/Other  diabetes, Type 1, Insulin Dependent  Renal/GU negative Renal ROS  negative genitourinary   Musculoskeletal   Abdominal   Peds  Hematology negative hematology ROS (+)   Anesthesia Other Findings   Reproductive/Obstetrics negative OB ROS                         Anesthesia Physical Anesthesia Plan  ASA: III  Anesthesia Plan: General   Post-op Pain Management:    Induction: Intravenous  Airway Management Planned: Mask  Additional Equipment:   Intra-op Plan:   Post-operative Plan:   Informed Consent: I have reviewed the patients History and Physical, chart, labs and discussed the procedure including the risks, benefits and alternatives for the proposed anesthesia with the patient or authorized representative who has indicated his/her understanding and acceptance.   Dental advisory given  Plan Discussed with: CRNA  Anesthesia Plan Comments:         Anesthesia Quick Evaluation

## 2013-04-08 NOTE — Progress Notes (Signed)
Tim Caraveo, MD, MPH, FACS Trauma: [phone] General Surgery: [phone]  

## 2013-04-08 NOTE — Progress Notes (Signed)
Patient ID: Tim Short, male   DOB: 1934/12/02, 79 y.o.   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 Pt was in prolonged respiratory failure at [facility], unable to wean of the vent and requiring trach placement 10/29/2012. He was transferred to [facility] in December 2014 and later to [facility] in late January 2015. He was on TNA and was doing fairly well initially, but developed more abdominal pain and on 2/23 taken to OR in [place] for gallbladder removal (secondary to cholecystitis), resection for part of the colon for fistula, IVC filter placement. Sent back to [facility] on march 4th, 2015 and started on TPN. Diet advanced on March 9th, 2015 after pt passed swallow evaluation. He was brought to [facility] March 16th, after noticing progressive abdominal distension. In ED, CT abdomen with air- fluid level and findings consistent with early partial SBO, ileus. Surgery consulted and [facility] asked to admit for further evaluation.   A/P Abdominal pain  - secondary to ileus, partial SBO. With mild abdominal pain this morning.  - continue supportive care with analgesia, antiemetics as needed for symptom control. - nutrition consulted, appreciate input, patient is not meeting his nutritional needs at this point based on oral intake.  - patient with intermittent confusion and on anticoagulation for his A fib, will hold off PEG tube placement. TPN initiated 3/26 - continue oral intake as tolerated.  Atrial fibrillation  - rate in 120's - 130's  - Cardizem dose increased from 240 --> 360 mg PO QD, Pradaxa changed to Xarelto (3/23)   - appreciate cardiology input  - plan for cardioversion today.  Leukocytosis  - likely secondary to ileus, ? HCAP as suggestive per CXR 3/20  - CT abd/pelvis with mild leak around anastomosis but no other acute events  - WBC is trending down and within normal limits in the past 2 days.  HCAP - was on vancomycin and Maxipime for 5 days to cover for HCAP and transitioned to Levaquin 3/24 for 2 more days to complete 7 days therapy. Last dose 3/26.  Pulmonary vascular congestion - lung exam improving and stable.  - IVF stopped 3/20 due to pulmonary vascular congestion  - monitor I's/O's, daily weights  - weight trend: 203 lbs --> 195 --> 192 Hypokalemia - continue supplementation. Magnesium normal.  Sacral ulcer - skin breakdown noted, closely monitor. Nursing care. No evidence of infection.  Anemia of chronic disease - no signs of active bleeding  Essential hypertension - within normal limits today Tracheostomy status - suctioning as needed  Hypernatremia - worsening today in light of almost no po intake.  Moderate malnutrition  - secondary to acute on chronic illness as outlined above  - per SLP, pudding thick liquids allowed  - pt tolerating well however does not seem to meet his nutritional requirements. - on TPN. Ongoing SLP evaluation on Monday for progress.    Consultants:  Surgery  Cardiology   Procedures/Studies:  Ct Abdomen Pelvis W Contrast 03/28/2013 Findings consistent with persistent phlegmon in the liver. Interim removal of right upper quadrant drainage catheter. Colonic distention with air-fluid levels. Slight progression of soft tissue fluid in the anterior abdomen in the  region the patient's scarring. 13 mm low-density lesion left kidney, not definite simple cyst.  Dg Abd 2 Views 03/30/2013 Mildly dilated loops of left upper quadrant small bowel with scattered internal air-fluid levels, similar to the prior exam, with abnormal but nonspecific pattern which could be due to  could ileus or partial SBO.  Dg Abd 2 View 03/29/2013 Nonspecific bowel gas pattern, as above, suggestive of partial small bowel obstruction. No pneumoperitoneum.  Dg Abd Acute W/chest 03/28/2013 Persistent small bowel dilatation compatible with either partial SBO or ileus. No change in aeration to the lungs compared with previous exam. Antibiotics:  Vancomycin 3/20 --> 3/24 Maxipime 3/20 --> 3/24 Levaquin 3/24 --> 3/26  Code Status: Full  Family Communication: Son Onalee Hua bedside  Disposition Plan: Remains inpatient, LTACH vs SNF  HPI/Subjective: No events overnight, alert this morning, appropriate  Objective: Filed Vitals:   04/08/13 0214 04/08/13 0522 04/08/13 0554 04/08/13 0751  BP:   100/86   Pulse: 118 101 116 119  Temp:   98.5 F (36.9 C)   TempSrc:   Oral   Resp: 18 18 18 18   Height:      Weight:      SpO2: 94% 94% 95% 92%    Intake/Output Summary (Last 24 hours) at 04/08/13 0803 Last data filed at 04/07/13 2042  Gross per 24 hour  Intake      0 ml  Output    100 ml  Net   -100 ml   Exam:  General:  Pt is alert, follows commands appropriately, not in acute distress, trach in place   Cardiovascular: Irregular rhythm, tachycardic, S1/S2, no murmurs, no rubs, no gallops  Respiratory: Clear to auscultation bilaterally, no wheezing, mild bibasilar rhonchi  Abdomen: Soft, non tender, non distended, bowel sounds present, no guarding  Extremities: No edema, pulses DP and PT palpable bilaterally  Data Reviewed: Basic Metabolic Panel:  Recent Labs Lab 04/04/13 0620 04/05/13 0503 04/05/13 2225 04/06/13 0440 04/06/13 1430 04/07/13 1150 04/08/13 0500  NA 154* 152*  --  152*  --  158* 155*  K 2.7* 2.9* 3.1* 2.9* 3.3* 3.3* 3.0*  CL 117* 115*  --  112  --  119* 118*  CO2 25 26  --  26  --  28 28  GLUCOSE 115* 109*  --  114*  --  126* 161*  BUN 12 11  --  11  --  11 13  CREATININE 0.75 0.77  --  0.78  --  0.76 0.76  CALCIUM 7.6* 8.2*  --  8.3*  --  8.7 8.6  MG   --   --   --   --  2.2  --  2.1  PHOS  --   --   --   --   --   --  2.1*   CBC:  Recent Labs Lab 04/04/13 0412 04/05/13 0503 04/06/13 0440 04/07/13 1150 04/08/13 0500  WBC 11.9* 10.5 10.4 9.7 8.8  NEUTROABS  --   --   --   --  6.9  HGB 9.1* 9.1* 8.9* 9.0* 8.6*  HCT 29.4* 29.3* 28.9* 29.5* 27.9*  MCV 95.8 97.0 97.3 98.7 98.9  PLT 191 162 162 147* 130*   CBG:  Recent Labs Lab 04/07/13 0634 04/07/13 1144 04/07/13 1644 04/07/13 2038 04/08/13 0631  GLUCAP 114* 100* 95 149* 140*   Recent Results (from the past 240 hour(s))  CULTURE, BLOOD (ROUTINE X 2)     Status: None   Collection Time  04/01/13 10:55 AM      Result Value Ref Range Status   Specimen Description BLOOD LEFT HAND   Final   Special Requests BOTTLES DRAWN AEROBIC AND ANAEROBIC 5CC   Final   Culture  Setup Time     Final   Value: 04/01/2013 14:39     Performed at [facility]   Culture     Final   Value: NO GROWTH 5 DAYS     Performed at [facility]   Report Status 04/07/2013 FINAL   Final  CULTURE, BLOOD (ROUTINE X 2)     Status: None   Collection Time    04/01/13 11:05 AM      Result Value Ref Range Status   Specimen Description BLOOD RIGHT HAND   Final   Special Requests BOTTLES DRAWN AEROBIC AND ANAEROBIC 5CC   Final   Culture  Setup Time     Final   Value: 04/01/2013 14:39     Performed at [facility]   Culture     Final   Value: NO GROWTH 5 DAYS     Performed at [facility]   Report Status 04/07/2013 FINAL   Final  CULTURE, RESPIRATORY (NON-EXPECTORATED)     Status: None   Collection Time    04/01/13  3:25 PM      Result Value Ref Range Status   Specimen Description TRACHEAL ASPIRATE   Final   Special Requests NONE   Final   Gram Stain     Final   Value: FEW WBC PRESENT,BOTH PMN AND MONONUCLEAR     NO SQUAMOUS EPITHELIAL CELLS SEEN     FEW GRAM POSITIVE RODS     RARE YEAST     Performed at [facility]   Culture     Final   Value: FEW  KLEBSIELLA PNEUMONIAE     Performed at [facility]   Report Status 04/03/2013 FINAL   Final   Organism ID, Bacteria KLEBSIELLA PNEUMONIAE   Final  CULTURE, RESPIRATORY (NON-EXPECTORATED)     Status: None   Collection Time    04/02/13  8:34 AM      Result Value Ref Range Status   Specimen Description TRACHEAL ASPIRATE   Final   Special Requests NONE   Final   Gram Stain     Final   Value: ABUNDANT WBC PRESENT, PREDOMINANTLY PMN     MODERATE SQUAMOUS EPITHELIAL CELLS PRESENT     FEW GRAM POSITIVE COCCI IN PAIRS     IN CLUSTERS IN CHAINS FEW GRAM NEGATIVE COCCI     Performed at [facility]   Culture     Final   Value: Non-Pathogenic Oropharyngeal-type Flora Isolated.     Performed at [facility]   Report Status 04/04/2013 FINAL   Final    Scheduled Meds: . amiodarone  400 mg Oral BID  . atorvastatin  40 mg Oral Daily  . chlorhexidine  15 mL Mouth/Throat BID  . diltiazem  240 mg Oral BID  . feeding supplement (ENSURE)  1 Container Oral TID BM  . insulin aspart  0-9 Units Subcutaneous TID WC  . levofloxacin  750 mg Oral Daily  . pantoprazole  40 mg Oral Daily  . potassium chloride  40 mEq Oral Once  . predniSONE  5 mg Oral Q breakfast  . rivaroxaban  20 mg Oral Q supper  . roflumilast  500 mcg Oral Daily  . sodium chloride  10-40  mL Intracatheter Q12H  . sodium chloride  3 mL Intravenous Q12H  . sodium chloride  3 mL Intravenous Q12H   Pamella PertGHERGHE, COSTIN, MD  Pager [phone] Time spent: 35 minutes If 7PM-7AM, please contact night-coverage [email or web address] Password [email or web address] 04/08/2013, 8:03 AM   LOS: 11 days

## 2013-04-08 NOTE — Anesthesia Postprocedure Evaluation (Signed)
  Anesthesia Post-op Note  Patient: Tim Short  Procedure(s) Performed: Procedure(s): CARDIOVERSION    (BEDSIDE)  (N/A)  Patient Location: Nursing Unit  Anesthesia Type:General  Level of Consciousness: awake, alert , oriented and patient cooperative  Airway and Oxygen Therapy: Patient Spontanous Breathing and Patient connected to tracheostomy mask oxygen  Post-op Pain: none  Post-op Assessment: Post-op Vital signs reviewed, Patient's Cardiovascular Status Stable, Respiratory Function Stable, Patent Airway, No signs of Nausea or vomiting, Pain level controlled, No headache, No backache, No residual numbness and No residual motor weakness  Post-op Vital Signs: Reviewed and stable  Complications: No apparent anesthesia complications

## 2013-04-08 NOTE — Progress Notes (Signed)
    Subjective:  Denies CP or dyspnea; complains of palpitations; mild abdominal pain (improved).   He is now is sinus rhythm post cardioversion today.  Objective:  Filed Vitals:   04/08/13 1200 04/08/13 1335 04/08/13 1405 04/08/13 1615  BP: 128/68  104/60   Pulse:  94 85 84  Temp:   98.5 F (36.9 C)   TempSrc:   Oral   Resp:  18 17 18   Height:      Weight:      SpO2:  99% 99% 97%    Intake/Output from previous day:  Intake/Output Summary (Last 24 hours) at 04/08/13 1714 Last data filed at 04/08/13 1300  Gross per 24 hour  Intake    200 ml  Output    300 ml  Net   -100 ml    Physical Exam: Physical exam: Well-developed chronically ill appearing in no acute distress.  Skin is warm and dry.  OP) clear Neck is supple.  Chest with mildly diminished BS bases Cardiovascular exam is RRR Abdominal exam s/p abdominal surgery Extremities show no edema. neuro grossly intact   Lab Results: Basic Metabolic Panel:  Recent Labs  16/10/9601/25/15 1430 04/07/13 1150 04/08/13 0500  NA  --  158* 155*  K 3.3* 3.3* 3.0*  CL  --  119* 118*  CO2  --  28 28  GLUCOSE  --  126* 161*  BUN  --  11 13  CREATININE  --  0.76 0.76  CALCIUM  --  8.7 8.6  MG 2.2  --  2.1  PHOS  --   --  2.1*   CBC:  Recent Labs  04/07/13 1150 04/08/13 0500  WBC 9.7 8.8  NEUTROABS  --  6.9  HGB 9.0* 8.6*  HCT 29.5* 27.9*  MCV 98.7 98.9  PLT 147* 130*     Assessment/Plan:  1 atrial flutter Now in sinus rhythm post cardioversion today Continue xarelto without interuption Change diltiazem to 240mg  daily Decrease amiodarone to 200mg  BID at discharge, then 200mg  daily in 4 weeks. 2 Coronary artery disease-continue statin. 3 ileus-management per general surgery. 4 hypertension-continue present medications. 5.hypokalemia- primary team to manage  No further inpatient CV workup planned We will see as needed while here. Follow-up with Dr Eldridge DaceVaranasi in 6 weeks  Hillis RangeJames Arraya Buck MD

## 2013-04-08 NOTE — Evaluation (Signed)
Occupational Therapy Evaluation Patient Details Name: Tim Short MRN: [id number] DOB: 07/25/1934 Today's Date: 04/08/2013    History of Present Illness 79 yo male with recent complications at [facility] after an ablation for afib in oct 2014 he developed a volvulus after the ablation was intubated/trachedm required multiple abd surgeries then developed fistula details unknown.  Has been on tpn, with ngt on/off since. Is at [facility] sent here for complaints of abd pain.  Pt was taken off tpn and feeding tube was removed last week.  He has been eating by mouth and doing well.  But last 4 days with worsening abd pain, no n/v/d.  No fevers (however is was started on vanc and zosyn for unknown reasons).  Pt na level is elevated, and appears dehydrated. 04/08/13 Pt now reports that he is having vertigo which has ebbed and flowed for the  past 14 years.   Clinical Impression   This 79 yo male admitted with above presents to acute OT with vertigo/dizziness/nausea (PT has started addressing this today) really affecting his ability to perform BADLs. [facility]    Follow Up Recommendations  LTACH    Equipment Recommendations   (TBD at next venue)       Precautions / Restrictions Precautions Precautions: Fall Precaution Comments: vertigo      Mobility Bed Mobility Overal bed mobility: Needs Assistance Bed Mobility: Rolling;Sidelying to Sit;Sit to Sidelying Rolling: Min assist Sidelying to sit: Max assist     Sit to sidelying: Max assist    Transfers                 General transfer comment: Says he just cannot do it today due to being too dizzy    Balance Overall balance assessment: Needs assistance Sitting-balance support: Feet supported;Bilateral upper extremity supported Sitting balance-[name] Scale: Poor                              ADL Eating/Feeding: Supervision/ safety;Set up;Bed level Grooming: Bed level;Set  up;Supervision/safety   Upper Body Dressing : Maximal assistance;Bed level Lower Body Bathing: Maximal assistance;Bed level Lower Body Dressing: Total assistance;Bed level         General ADL Comments: Pt cannot currently perform BADLs at any level other than at bed level due to his vertigo with associated nausea and at bed level his limited. See PT note for vestibular input               Pertinent Vitals/Pain No c/o pain     Hand Dominance Right   Extremity/Trunk Assessment Upper Extremity Assessment Upper Extremity Assessment: Generalized weakness           Communication Communication Communication: Passy-Muir valve   Cognition Arousal/Alertness: Awake/alert Behavior During Therapy: WFL for tasks assessed/performed Overall Cognitive Status: Within Functional Limits for tasks assessed                             Home Living Family/patient expects to be discharged to:: Skilled nursing facility                                        Prior Functioning/Environment Level of Independence: Independent with assistive device(s)  OT Problem List: Decreased strength;Decreased activity tolerance;Impaired balance (sitting and/or standing);Decreased knowledge of use of DME or AE   OT Treatment/Interventions: Self-care/ADL training;Patient/family education;Balance training;DME and/or AE instruction;Therapeutic activities    OT Goals(Current goals can be found in the care plan section) Acute Rehab OT Goals OT Goal Formulation: With patient Time For Goal Achievement: 04/22/13 Potential to Achieve Goals: Fair  OT Frequency: Min 2X/week   Barriers to D/C: Decreased caregiver support          End of Session:    Activity Tolerance:  (limited by dizzines) Patient left: in bed   Time: 1440-1511 OT Time Calculation (min): 31 min Charges:  OT General Charges $OT Visit: 1 Procedure OT Evaluation $Initial OT Evaluation Tier  I: 1 Procedure OT Treatments $Therapeutic Activity: 8-22 mins  Evette Georges [phone] 04/08/2013, 4:35 PM

## 2013-04-08 NOTE — Progress Notes (Signed)
SLP Cancellation Note  Patient Details Name: Tim Short MRN: [id number] DOB: 12/02/1934   Cancelled treatment:       Reason Eval/Treat Not Completed: Patient at procedure or test/unavailable. Will f/u at bedside on Monday for pt readiness to complete objective swallow test.    Zarinah Oviatt, Riley NearingBonnie Caroline 04/08/2013, 11:26 AM

## 2013-04-08 NOTE — Progress Notes (Signed)
Physical Therapy Treatment Patient Details Name: Tim Short MRN: [id number] DOB: 08/31/1934 Today's Date: 04/08/2013    History of Present Illness 79 yo male with recent complications at [facility] after an [facility] for afib in oct 2014 he developed a volvulus after the ablation was intubated/trachedm required multiple abd surgeries then developed fistula details unknown.  Has been on tpn, with ngt on/off since. Is at [facility] sent here for complaints of abd pain.  Pt was taken off tpn and feeding tube was removed last week.  He has been eating by mouth and doing well.  But last 4 days with worsening abd pain, no n/v/d.  No fevers (however is was started on vanc and zosyn for unknown reasons).  Pt na level is elevated, and appears dehydrated. 04/08/13 Pt now reports that he is having vertigo which has ebbed and flowed for the  past 14 years.    PT Comments    Today addressed pt's c/o of vertigo (dizziness and spinning with movement and standing) by testing for posterior and horizontal BPPV.  Supine Head Roll was negative.   With Hallpike-Dix, pt reported dizziness and spinning for both L and R, but with no nystagmus noted.  Treated R side with Epply.  Will return 3/28 as able to treat the leftt side as pt was starting to get nauseated after treating the right.   Follow Up Recommendations  SNF     Equipment Recommendations       Recommendations for Other Services       Precautions / Restrictions Precautions Precautions: Fall Precaution Comments: vertigo    Mobility  Bed Mobility Overal bed mobility: Needs Assistance Bed Mobility: Supine to Sit Rolling: Min assist Sidelying to sit: Max assist   Sit to supine: Mod assist Sit to sidelying: Max assist General bed mobility comments: truncal assist to help pt accomplish [employer].  Transfers                 General transfer comment: Says he just cannot do it today due to being too dizzy  Ambulation/Gait                  Stairs            Wheelchair Mobility    Modified Rankin (Stroke Patients Only)       Balance Overall balance assessment: Needs assistance Sitting-balance support: Feet supported;Bilateral upper extremity supported Sitting balance-[name] Scale: Poor Sitting balance - Comments: tendency to fall backward with slope of the mattress                            Cognition Arousal/Alertness: Awake/alert Behavior During Therapy: WFL for tasks assessed/performed Overall Cognitive Status: Within Functional Limits for tasks assessed                      Exercises      General Comments        Pertinent Vitals/Pain     Home Living Family/patient expects to be discharged to:: Skilled nursing facility                    Prior Function Level of Independence: Independent with assistive device(s)          PT Goals (current goals can now be found in the care plan section) Acute Rehab PT Goals PT Goal Formulation: With patient Time For Goal Achievement: 04/19/13 Potential to Achieve Goals: Good Progress  towards PT goals: Progressing toward goals    Frequency  Min 3X/week    PT Plan Current plan remains appropriate    End of Session Equipment Utilized During Treatment: Oxygen Activity Tolerance: Patient tolerated treatment well;Other (comment) Patient left: in bed;with call bell/phone within reach     Time: 1444-1515 PT Time Calculation (min): 31 min  Charges:  $Therapeutic Activity: 8-22 mins                    G Codes:      Khaleed Holan, Eliseo Gum 04/08/2013, 5:37 PM 04/08/2013   Bing, PT [phone]  (pager)

## 2013-04-09 LAB — MAGNESIUM: MAGNESIUM: 2.2 mg/dL (ref 1.5–2.5)

## 2013-04-09 LAB — COMPREHENSIVE METABOLIC PANEL
ALBUMIN: 2.4 g/dL — AB (ref 3.5–5.2)
ALT: 42 U/L (ref 0–53)
AST: 32 U/L (ref 0–37)
Alkaline Phosphatase: 68 U/L (ref 39–117)
BUN: 19 mg/dL (ref 6–23)
CALCIUM: 8.9 mg/dL (ref 8.4–10.5)
CO2: 27 mEq/L (ref 19–32)
CREATININE: 0.73 mg/dL (ref 0.50–1.35)
Chloride: 116 mEq/L — ABNORMAL HIGH (ref 96–112)
GFR calc Af Amer: >90 mL/min (ref >90)
GFR calc non Af Amer: 87 mL/min — ABNORMAL LOW (ref >90)
Glucose, Bld: 188 mg/dL — ABNORMAL HIGH (ref 70–99)
Potassium: 3.3 mEq/L — ABNORMAL LOW (ref 3.7–5.3)
Sodium: 155 mEq/L — ABNORMAL HIGH (ref 137–147)
TOTAL PROTEIN: 5.1 g/dL — AB (ref 6.0–8.3)
Total Bilirubin: 0.3 mg/dL (ref 0.3–1.2)

## 2013-04-09 LAB — GLUCOSE, CAPILLARY
GLUCOSE-CAPILLARY: 168 mg/dL — AB (ref 70–99)
Glucose-Capillary: 164 mg/dL — ABNORMAL HIGH (ref 70–99)
Glucose-Capillary: 152 mg/dL — ABNORMAL HIGH (ref 70–99)
Glucose-Capillary: 207 mg/dL — ABNORMAL HIGH (ref 70–99)

## 2013-04-09 LAB — CBC
HEMATOCRIT: 28.1 % — AB (ref 39.0–52.0)
Hemoglobin: 8.5 g/dL — ABNORMAL LOW (ref 13.0–17.0)
MCH: 29.8 pg (ref 26.0–34.0)
MCHC: 30.2 g/dL (ref 30.0–36.0)
MCV: 98.6 fL (ref 78.0–100.0)
PLATELETS: 125 10*3/uL — AB (ref 150–400)
RBC: 2.85 MIL/uL — ABNORMAL LOW (ref 4.22–5.81)
RDW: 21.4 % — ABNORMAL HIGH (ref 11.5–15.5)
WBC: 11.2 10*3/uL — AB (ref 4.0–10.5)

## 2013-04-09 LAB — PHOSPHORUS: PHOSPHORUS: 2.9 mg/dL (ref 2.3–4.6)

## 2013-04-09 MED ORDER — M.V.I. ADULT IV INJ
INTRAVENOUS | Status: AC
  Administered 2013-04-09: 18:00:00 via INTRAVENOUS
  Filled 2013-04-09: qty 2000

## 2013-04-09 MED ORDER — POTASSIUM CHLORIDE 10 MEQ/50ML IV SOLN
10.0000 meq | INTRAVENOUS | Status: AC
  Administered 2013-04-09 (×4): 10 meq via INTRAVENOUS
  Filled 2013-04-09 (×4): qty 50

## 2013-04-09 MED ORDER — FAT EMULSION 20 % IV EMUL
250.0000 mL | INTRAVENOUS | Status: AC
  Administered 2013-04-09: 250 mL via INTRAVENOUS
  Filled 2013-04-09: qty 250

## 2013-04-09 NOTE — Clinical Social Work Note (Signed)
SW continues to follow for d/c planning needs. Per nurse, d/c pending.   Davaun Quintela Patrick-Jefferson, LCSWA Weekend Clinical Social Worker [phone]

## 2013-04-09 NOTE — Progress Notes (Signed)
Patient ID: Tim Short, male   DOB: Apr 04, 1934, 79 y.o.   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 Pt was in prolonged respiratory failure at [facility], unable to wean of the vent and requiring trach placement 10/29/2012. He was transferred to [facility] in December 2014 and later to [facility] in late January 2015. He was on TNA and was doing fairly well initially, but developed more abdominal pain and on 2/23 taken to OR in [place] for gallbladder removal (secondary to cholecystitis), resection for part of the colon for fistula, IVC filter placement. Sent back to [facility] on march 4th, 2015 and started on TPN. Diet advanced on March 9th, 2015 after pt passed swallow evaluation. He was brought to [facility] March 16th, after noticing progressive abdominal distension. In ED, CT abdomen with air- fluid level and findings consistent with early partial SBO, ileus. Surgery consulted and [facility] asked to admit for further evaluation.   A/P Abdominal pain  - secondary to ileus, partial SBO. With mild abdominal pain this morning, somewhat stable within past few days. No nausea/vomiting, no diarrhea. Passing gas and has BMs. Eating little, as tolerated.  - continue supportive care with analgesia, antiemetics as needed for symptom control. - nutrition consulted, appreciate input, patient is not meeting his nutritional needs based on oral intake.  - patient with intermittent confusion and on anticoagulation for his A fib, will hold off PEG tube placement. TPN initiated 3/26 - continue oral intake as tolerated.  Moderate malnutrition   - secondary to acute on chronic illness as outlined above  - per SLP, pudding thick liquids allowed  - pt tolerating well however does not seem to meet his nutritional requirements. - on TPN. Ongoing SLP evaluation on Monday for progress.  Atrial fibrillation  - rate in 120's - 130's while on medications, cardiology following, now s/p cardioversion 3/27, restored sinus rhythm and maintaining. - appreciate cardiology input  Leukocytosis  - likely secondary to ileus, ? HCAP as suggestive per CXR 3/20  - CT abd/pelvis with mild leak around anastomosis but no other acute events  HCAP - s/p complete 7 day treatment initially with Vancomycin and Maxipime for 5 days then transitioned to for 2 more days, finished antibiotics on 3/26.  Pulmonary vascular congestion - lung exam improving and stable.  - IVF stopped 3/20 due to pulmonary vascular congestion  - monitor I's/O's, daily weights  - weight trend: 203 lbs --> 195 --> 192 >> 194 Hypokalemia - continue supplementation. Magnesium normal.  Sacral ulcer - skin breakdown noted, closely monitor. Nursing care. No evidence of infection.  Anemia of chronic disease - no signs of active bleeding  Essential hypertension - within normal limits today Tracheostomy status - suctioning as needed  Hypernatremia - stable, TPN per pharmacy  Consultants:  Surgery  Cardiology   Procedures/Studies:  Ct Abdomen Pelvis W Contrast 03/28/2013 Findings consistent with persistent phlegmon in the liver. Interim removal of right upper quadrant drainage catheter. Colonic distention with air-fluid levels. Slight progression of soft tissue fluid in the anterior abdomen in the region the patient's scarring. 13 mm low-density lesion left kidney, not definite simple cyst.  Dg Abd 2 Views  03/30/2013 Mildly dilated loops of left upper quadrant small bowel with scattered internal air-fluid levels, similar to the prior exam, with abnormal but nonspecific pattern which could be due  to could ileus or partial SBO.  Dg Abd 2 View 03/29/2013 Nonspecific bowel gas pattern, as above, suggestive of partial small bowel obstruction. No pneumoperitoneum.  Dg Abd Acute W/chest 03/28/2013 Persistent small bowel dilatation compatible with either partial SBO or ileus. No change in aeration to the lungs compared with previous exam. Antibiotics:  Vancomycin 3/20 --> 3/24 Maxipime 3/20 --> 3/24 Levaquin 3/24 --> 3/26  Code Status: Full  Family Communication: d/w patient, will call son Disposition Plan: Remains inpatient, LTACH vs SNF  HPI/Subjective: No events overnight, alert this morning, appropriate.   Objective: Filed Vitals:   04/09/13 0041 04/09/13 0418 04/09/13 0510 04/09/13 0821  BP:   106/64   Pulse: 81 84 83 85  Temp:   98.6 F (37 C)   TempSrc:   Oral   Resp: 18 18 18 16   Height:      Weight:      SpO2: 97% 96% 96% 96%    Intake/Output Summary (Last 24 hours) at 04/09/13 0928 Last data filed at 04/09/13 0800  Gross per 24 hour  Intake    440 ml  Output    200 ml  Net    240 ml   Exam:  General:  Pt is alert, follows commands appropriately, not in acute distress, trach in place   Cardiovascular: regular rhythm, S1/S2, no murmurs, no rubs, no gallops  Respiratory: Clear to auscultation bilaterally, no wheezing, mild bibasilar rhonchi  Abdomen: Soft, non tender, non distended, bowel sounds present, no guarding  Extremities: No edema, pulses DP and PT palpable bilaterally  Data Reviewed: Basic Metabolic Panel:  Recent Labs Lab 04/05/13 0503  04/06/13 0440 04/06/13 1430 04/07/13 1150 04/08/13 0500 04/09/13 0416  NA 152*  --  152*  --  158* 155* 155*  K 2.9*  < > 2.9* 3.3* 3.3* 3.0* 3.3*  CL 115*  --  112  --  119* 118* 116*  CO2 26  --  26  --  28 28 27   GLUCOSE 109*  --  114*  --  126* 161* 188*  BUN 11  --  11  --  11 13 19   CREATININE 0.77  --  0.78  --  0.76 0.76 0.73  CALCIUM 8.2*  --  8.3*  --  8.7 8.6 8.9  MG  --   --   --  2.2  --   2.1 2.2  PHOS  --   --   --   --   --  2.1* 2.9  < > = values in this interval not displayed. CBC:  Recent Labs Lab 04/05/13 0503 04/06/13 0440 04/07/13 1150 04/08/13 0500 04/09/13 0416  WBC 10.5 10.4 9.7 8.8 11.2*  NEUTROABS  --   --   --  6.9  --   HGB 9.1* 8.9* 9.0* 8.6* 8.5*  HCT 29.3* 28.9* 29.5* 27.9* 28.1*  MCV 97.0 97.3 98.7 98.9 98.6  PLT 162 162 147* 130* 125*   CBG:  Recent Labs Lab 04/08/13 0631 04/08/13 1115 04/08/13 1647 04/08/13 2112 04/09/13 0601  GLUCAP 140* 137* 167* 183* 168*   Recent Results (from the past 240 hour(s))  CULTURE, BLOOD (ROUTINE X 2)     Status: None   Collection Time    04/01/13 10:55 AM      Result Value Ref Range  Status   Specimen Description BLOOD LEFT HAND   Final   Special Requests BOTTLES DRAWN AEROBIC AND ANAEROBIC 5CC   Final   Culture  Setup Time     Final   Value: 04/01/2013 14:39     Performed at [facility]   Culture     Final   Value: NO GROWTH 5 DAYS     Performed at [facility]   Report Status 04/07/2013 FINAL   Final  CULTURE, BLOOD (ROUTINE X 2)     Status: None   Collection Time    04/01/13 11:05 AM      Result Value Ref Range Status   Specimen Description BLOOD RIGHT HAND   Final   Special Requests BOTTLES DRAWN AEROBIC AND ANAEROBIC 5CC   Final   Culture  Setup Time     Final   Value: 04/01/2013 14:39     Performed at [facility]   Culture     Final   Value: NO GROWTH 5 DAYS     Performed at [facility]   Report Status 04/07/2013 FINAL   Final  CULTURE, RESPIRATORY (NON-EXPECTORATED)     Status: None   Collection Time    04/01/13  3:25 PM      Result Value Ref Range Status   Specimen Description TRACHEAL ASPIRATE   Final   Special Requests NONE   Final   Gram Stain     Final   Value: FEW WBC PRESENT,BOTH PMN AND MONONUCLEAR     NO SQUAMOUS EPITHELIAL CELLS SEEN     FEW GRAM POSITIVE RODS     RARE YEAST     Performed at [facility]   Culture      Final   Value: FEW KLEBSIELLA PNEUMONIAE     Performed at [facility]   Report Status 04/03/2013 FINAL   Final   Organism ID, Bacteria KLEBSIELLA PNEUMONIAE   Final  CULTURE, RESPIRATORY (NON-EXPECTORATED)     Status: None   Collection Time    04/02/13  8:34 AM      Result Value Ref Range Status   Specimen Description TRACHEAL ASPIRATE   Final   Special Requests NONE   Final   Gram Stain     Final   Value: ABUNDANT WBC PRESENT, PREDOMINANTLY PMN     MODERATE SQUAMOUS EPITHELIAL CELLS PRESENT     FEW GRAM POSITIVE COCCI IN PAIRS     IN CLUSTERS IN CHAINS FEW GRAM NEGATIVE COCCI     Performed at [facility]   Culture     Final   Value: Non-Pathogenic Oropharyngeal-type Flora Isolated.     Performed at [facility]   Report Status 04/04/2013 FINAL   Final    Scheduled Meds: . amiodarone  400 mg Oral BID  . atorvastatin  40 mg Oral Daily  . chlorhexidine  15 mL Mouth/Throat BID  . diltiazem  240 mg Oral Daily  . feeding supplement (ENSURE)  1 Container Oral TID BM  . hydrocortisone cream   Topical BID  . insulin aspart  0-9 Units Subcutaneous TID WC  . pantoprazole  40 mg Oral Daily  . potassium chloride  10 mEq Intravenous Q1 Hr x 4  . potassium chloride  40 mEq Oral Once  . predniSONE  5 mg Oral Q breakfast  . rivaroxaban  20 mg Oral Q supper  . roflumilast  500 mcg Oral Daily  . sodium chloride  10-40  mL Intracatheter Q12H  . sodium chloride  3 mL Intravenous Q12H  . sodium chloride  3 mL Intravenous Q12H   Pamella PertGHERGHE, Janisha Bueso, MD Pager [phone] Time spent: 35 minutes If 7PM-7AM, please contact night-coverage [email or web address] Password [email or web address] 04/09/2013, 9:28 AM   LOS: 12 days

## 2013-04-09 NOTE — Progress Notes (Signed)
PT Cancellation Note  Patient Details Name: Tim Short MRN: [id number] DOB: 01/09/1935   Cancelled Treatment:    Reason Eval/Treat Not Completed: Patient declined.  He reports that he feels the vertigo is gone since our treatment yesterday, but today he doesn't feel well and doesn't want to do anything. 04/09/2013  [place] BingKen Stephanos Short, PT [phone]  (pager)   Tim Short, Tim Short 04/09/2013, 2:29 PM

## 2013-04-09 NOTE — Progress Notes (Signed)
PARENTERAL NUTRITION CONSULT NOTE - FOLLOW UP  Pharmacy Consult for TPN Indication: inadequate PO intake, resolving SBO  Allergies  Allergen Reactions  . Corticosteroids     Inhaled Corticosteroids--Hoarseness, dry mouth  . Furosemide Other (See Comments)    Ototoxicity     Patient Measurements: Height: 5' 8.9" (175 cm) Weight: 194 lb 3.6 oz (88.1 kg) IBW/kg (Calculated) : 70.47   Vital Signs: Temp: 98.6 F (37 C) (03/28 0510) Temp src: Oral (03/28 0510) BP: 106/64 mmHg (03/28 0510) Pulse Rate: 83 (03/28 0510) Intake/Output from previous day: 03/27 0701 - 03/28 0700 In: 200 [I.V.:200] Out: 200 [Urine:200] Intake/Output from this shift:    Labs:  Recent Labs  04/07/13 1150 04/08/13 0500 04/09/13 0416  WBC 9.7 8.8 11.2*  HGB 9.0* 8.6* 8.5*  HCT 29.5* 27.9* 28.1*  PLT 147* 130* 125*     Recent Labs  04/06/13 1430 04/07/13 1150 04/08/13 0500 04/09/13 0416  NA  --  158* 155* 155*  K 3.3* 3.3* 3.0* 3.3*  CL  --  119* 118* 116*  CO2  --  28 28 27   GLUCOSE  --  126* 161* 188*  BUN  --  11 13 19   CREATININE  --  0.76 0.76 0.73  CALCIUM  --  8.7 8.6 8.9  MG 2.2  --  2.1 2.2  PHOS  --   --  2.1* 2.9  PROT  --   --  4.9* 5.1*  ALBUMIN  --   --  2.3* 2.4*  AST  --   --  34 32  ALT  --   --  39 42  ALKPHOS  --   --  70 68  BILITOT  --   --  0.3 0.3  PREALBUMIN  --   --  18.1  --   TRIG  --   --  122  --    Estimated Creatinine Clearance: 84.8 ml/min (by C-G formula based on Cr of 0.73).    Recent Labs  04/08/13 1647 04/08/13 2112 04/09/13 0601  GLUCAP 167* 183* 168*    Insulin Requirements in the past 24 hours:  5 units of Novolog  Current Nutrition:  Dysphagia 1 diet with poor PO intake documented Clinimix E 5/15 at 60 ml/hr + 20% lipid emulsion at 10 ml/hr  Assessment: Admit: Transferred from [facility] with abdominal pain, partial SBO, ileus.  GI: resolved ileus per surgery.  Fall 2014 - volvulus, multiple abd surgeries, fistula  development, was on TPN long-term but recently stopped and was tolerating PO intake per report.  Now with poor PO intake on Dysphagia 1 diet.  Per MD will use TPN for support until PEG can be safely placed (on Xarelto). Please see RD note 3/27: recs for short-term enteral support via NGT is appropriate as he has a functioning GI tract  Nutrition: prealbumin 18.1 today, adequate.   Endo: CBGs well controlled on SSI. On PO prednisone  Lytes: Na 155 d/t dehydration; K 3.3 after 40 po yesterday, Mag 2.2, phos 2.9  Renal: SCr 0.73 - stable  Pulm: Trach collar  Cards: Afib - BP ok, tachycardia improved; diltiazem, on Xarelto  Hepatobil: last LFTs were normal, Albumin 2.4  Neuro: A&O  ID: completed a 7d course of ABX for HCAP 3/26  Best Practices: Xarelto for atrial fibrillation  TPN Access: PICC placed 3/22  TPN day#: 3  Nutritional Goals:  2050-2250 kCal, 110-120 grams of protein per day  Plan:  Increase Clinimix E 5/15 to 83  ml/hr + 20% lipid emulsion at 10 ml/hr.  This will provide 1894 kCal and 100 g protein per day. Continue CBGs and SSI TID with meals Give KCL 10mEq IV x 4 runs Follow PO intake and potential PEG placement for enteral nutrition Consider replacing NGT and feeding enterally as his gut works.  Estella HuskMichelle Ria Redcay, Pharm.D., BCPS, AAHIVP Clinical Pharmacist Phone: [phone] or [phone] 04/09/2013, 8:02 AM

## 2013-04-10 ENCOUNTER — Inpatient Hospital Stay (HOSPITAL_COMMUNITY): Payer: Medicare HMO

## 2013-04-10 LAB — CBC
HCT: 27.7 % — ABNORMAL LOW (ref 39.0–52.0)
Hemoglobin: 8.4 g/dL — ABNORMAL LOW (ref 13.0–17.0)
MCH: 30 pg (ref 26.0–34.0)
MCHC: 30.3 g/dL (ref 30.0–36.0)
MCV: 98.9 fL (ref 78.0–100.0)
Platelets: 114 10*3/uL — ABNORMAL LOW (ref 150–400)
RBC: 2.8 MIL/uL — ABNORMAL LOW (ref 4.22–5.81)
RDW: 21.5 % — AB (ref 11.5–15.5)
WBC: 9.9 10*3/uL (ref 4.0–10.5)

## 2013-04-10 LAB — GLUCOSE, CAPILLARY
GLUCOSE-CAPILLARY: 203 mg/dL — AB (ref 70–99)
Glucose-Capillary: 188 mg/dL — ABNORMAL HIGH (ref 70–99)
Glucose-Capillary: 176 mg/dL — ABNORMAL HIGH (ref 70–99)
Glucose-Capillary: 189 mg/dL — ABNORMAL HIGH (ref 70–99)

## 2013-04-10 LAB — COMPREHENSIVE METABOLIC PANEL
ALK PHOS: 63 U/L (ref 39–117)
ALT: 38 U/L (ref 0–53)
AST: 27 U/L (ref 0–37)
Albumin: 2.3 g/dL — ABNORMAL LOW (ref 3.5–5.2)
BUN: 25 mg/dL — ABNORMAL HIGH (ref 6–23)
CHLORIDE: 114 meq/L — AB (ref 96–112)
CO2: 26 meq/L (ref 19–32)
Calcium: 8.7 mg/dL (ref 8.4–10.5)
Creatinine, Ser: 0.69 mg/dL (ref 0.50–1.35)
GFR calc Af Amer: >90 mL/min (ref >90)
GFR, EST NON AFRICAN AMERICAN: 89 mL/min — AB (ref >90)
GLUCOSE: 206 mg/dL — AB (ref 70–99)
POTASSIUM: 3.4 meq/L — AB (ref 3.7–5.3)
SODIUM: 151 meq/L — AB (ref 137–147)
Total Bilirubin: 0.3 mg/dL (ref 0.3–1.2)
Total Protein: 4.9 g/dL — ABNORMAL LOW (ref 6.0–8.3)

## 2013-04-10 LAB — PHOSPHORUS: Phosphorus: 2.8 mg/dL (ref 2.3–4.6)

## 2013-04-10 LAB — MAGNESIUM: Magnesium: 2.2 mg/dL (ref 1.5–2.5)

## 2013-04-10 LAB — CLOSTRIDIUM DIFFICILE BY PCR: Toxigenic C. Difficile by PCR: NEGATIVE

## 2013-04-10 MED ORDER — ALPRAZOLAM 0.25 MG PO TABS
0.2500 mg | ORAL_TABLET | Freq: Two times a day (BID) | ORAL | Status: DC | PRN
  Administered 2013-04-10 – 2013-04-20 (×11): 0.25 mg via ORAL
  Filled 2013-04-10 (×11): qty 1

## 2013-04-10 MED ORDER — POTASSIUM CHLORIDE 10 MEQ/50ML IV SOLN
10.0000 meq | INTRAVENOUS | Status: AC
  Administered 2013-04-10 (×4): 10 meq via INTRAVENOUS
  Filled 2013-04-10 (×4): qty 50

## 2013-04-10 MED ORDER — M.V.I. ADULT IV INJ
INTRAVENOUS | Status: AC
  Administered 2013-04-10: 17:00:00 via INTRAVENOUS
  Filled 2013-04-10: qty 2000

## 2013-04-10 MED ORDER — FAT EMULSION 20 % IV EMUL
250.0000 mL | INTRAVENOUS | Status: AC
  Administered 2013-04-10: 250 mL via INTRAVENOUS
  Filled 2013-04-10: qty 250

## 2013-04-10 NOTE — Progress Notes (Signed)
Patient ID: Tim Short Tiller, male   DOB: 07/01/1934, 79 y.o.   MRN: [id number]  [facility] PROGRESS NOTE  Tim Short Derouin NFA:[id number] DOB: 08/15/1934 DOA: 03/28/2013 PCP:  Duane Lopeoss, Alan, MD  Abdominal pain - secondary to ileus, partial SBO, now resolving. With mild abdominal pain this morning, stable within past few days. No nausea/vomiting, but with diarrhea this morning. C diff sent. Eating little, as tolerated.  - continue supportive care with analgesia, antiemetics as needed for symptom control. - nutrition consulted, appreciate input, patient is not meeting his nutritional needs based on oral intake.  - patient with intermittent confusion and on anticoagulation for his A fib, will hold off PEG tube placement. TPN initiated 3/26 - continue oral intake as tolerated.  Moderate malnutrition  - secondary to acute on chronic illness as outlined above  - per SLP, pudding thick liquids allowed  - pt tolerating well however does not seem to meet his nutritional requirements. - on TPN. Ongoing SLP evaluation on Monday for progress.  Atrial fibrillation  - rate in 120's - 130's while on medications, cardiology following, now s/p cardioversion 3/27, restored sinus rhythm and maintaining. - sinus rhythm 3/29 am Leukocytosis  - likely secondary to ileus, ? HCAP as suggestive per CXR 3/20  - CT abd/pelvis with mild leak around anastomosis but no other acute events - now with diarrhea, mild elevation of WBC on 3/28, improved 3/29.  HCAP - s/p complete 7 day treatment initially with Vancomycin and Maxipime for 5 days then transitioned to for 2 more days, finished antibiotics on 3/26.  Pulmonary vascular congestion - lung exam improving and stable.  - IVF stopped 3/20 due to pulmonary vascular congestion  - monitor I's/O's, daily weights  - weight trend: 203 lbs --> 195 --> 192 >> 194 >> 191 - repeat CXR today Hypokalemia - continue supplementation. Magnesium normal. Slowly improving.  Sacral ulcer  - skin breakdown noted, closely monitor. Nursing care. No evidence of infection.  Anemia of chronic disease - no signs of active bleeding  Essential hypertension - within normal limits today Tracheostomy status - suctioning as needed  Hypernatremia - TPN per pharmacy. Improving.   Consultants:  Surgery  Cardiology   Procedures/Studies:  Ct Abdomen Pelvis W Contrast 03/28/2013 Findings consistent with persistent phlegmon in the liver. Interim removal of right upper quadrant drainage catheter. Colonic distention with air-fluid levels. Slight progression of soft tissue fluid in the anterior abdomen in the region the patient's scarring. 13 mm low-density lesion left kidney, not definite simple cyst.  Dg Abd 2 Views 03/30/2013 Mildly dilated loops of left upper quadrant small bowel with scattered internal air-fluid levels, similar to the prior exam, with abnormal but nonspecific pattern which could be due to could ileus or partial SBO.  Dg Abd 2 View 03/29/2013 Nonspecific bowel gas pattern, as above, suggestive of partial small bowel obstruction. No pneumoperitoneum.  Dg Abd Acute W/chest 03/28/2013 Persistent small bowel dilatation compatible with either partial SBO or ileus. No change in aeration to the lungs compared with previous exam. Antibiotics:  Vancomycin 3/20 --> 3/24 Maxipime 3/20 --> 3/24 Levaquin 3/24 --> 3/26  Code Status: Full  Family Communication: d/w patient this morning Disposition Plan: Remains inpatient, LTACH vs SNF  HPI/Subjective: No events overnight, alert this morning, appropriate.   Objective: Filed Vitals:   04/09/13 2123 04/09/13 2358 04/10/13 0348 04/10/13 0500  BP:   112/68   Pulse: 80 81 84   Temp:   98.3 F (36.8 C)  TempSrc:   Oral   Resp: 16 18 18    Height:      Weight:    86.637 kg (191 lb)  SpO2: 100% 100% 94%     Intake/Output Summary (Last 24 hours) at 04/10/13 1610 Last data filed at 04/09/13 2300  Gross per 24 hour  Intake      0 ml   Output    575 ml  Net   -575 ml   Exam:  General:  Pt is alert, follows commands appropriately, not in acute distress, trach in place   Cardiovascular: regular rhythm, S1/S2, no murmurs, no rubs, no gallops  Respiratory: Clear to auscultation bilaterally, no wheezing, mild bibasilar rhonchi  Abdomen: Soft, non tender, non distended, bowel sounds present, no guarding  Extremities: No edema, pulses DP and PT palpable bilaterally  Data Reviewed: Basic Metabolic Panel:  Recent Labs Lab 04/06/13 0440 04/06/13 1430 04/07/13 1150 04/08/13 0500 04/09/13 0416 04/10/13 0530  NA 152*  --  158* 155* 155* 151*  K 2.9* 3.3* 3.3* 3.0* 3.3* 3.4*  CL 112  --  119* 118* 116* 114*  CO2 26  --  28 28 27 26   GLUCOSE 114*  --  126* 161* 188* 206*  BUN 11  --  11 13 19  25*  CREATININE 0.78  --  0.76 0.76 0.73 0.69  CALCIUM 8.3*  --  8.7 8.6 8.9 8.7  MG  --  2.2  --  2.1 2.2 2.2  PHOS  --   --   --  2.1* 2.9 2.8   CBC:  Recent Labs Lab 04/06/13 0440 04/07/13 1150 04/08/13 0500 04/09/13 0416 04/10/13 0530  WBC 10.4 9.7 8.8 11.2* 9.9  NEUTROABS  --   --  6.9  --   --   HGB 8.9* 9.0* 8.6* 8.5* 8.4*  HCT 28.9* 29.5* 27.9* 28.1* 27.7*  MCV 97.3 98.7 98.9 98.6 98.9  PLT 162 147* 130* 125* 114*   CBG:  Recent Labs Lab 04/09/13 0601 04/09/13 1126 04/09/13 1627 04/09/13 2107 04/10/13 0605  GLUCAP 168* 164* 152* 207* 188*   Recent Results (from the past 240 hour(s))  CULTURE, BLOOD (ROUTINE X 2)     Status: None   Collection Time    04/01/13 10:55 AM      Result Value Ref Range Status   Specimen Description BLOOD LEFT HAND   Final   Special Requests BOTTLES DRAWN AEROBIC AND ANAEROBIC 5CC   Final   Culture  Setup Time     Final   Value: 04/01/2013 14:39     Performed at [facility]   Culture     Final   Value: NO GROWTH 5 DAYS     Performed at [facility]   Report Status 04/07/2013 FINAL   Final  CULTURE, BLOOD (ROUTINE X 2)     Status: None    Collection Time    04/01/13 11:05 AM      Result Value Ref Range Status   Specimen Description BLOOD RIGHT HAND   Final   Special Requests BOTTLES DRAWN AEROBIC AND ANAEROBIC 5CC   Final   Culture  Setup Time     Final   Value: 04/01/2013 14:39     Performed at [facility]   Culture     Final   Value: NO GROWTH 5 DAYS     Performed at [facility]   Report Status 04/07/2013 FINAL   Final  CULTURE, RESPIRATORY (NON-EXPECTORATED)  Status: None   Collection Time    04/01/13  3:25 PM      Result Value Ref Range Status   Specimen Description TRACHEAL ASPIRATE   Final   Special Requests NONE   Final   Gram Stain     Final   Value: FEW WBC PRESENT,BOTH PMN AND MONONUCLEAR     NO SQUAMOUS EPITHELIAL CELLS SEEN     FEW GRAM POSITIVE RODS     RARE YEAST     Performed at [facility]   Culture     Final   Value: FEW KLEBSIELLA PNEUMONIAE     Performed at [facility]   Report Status 04/03/2013 FINAL   Final   Organism ID, Bacteria KLEBSIELLA PNEUMONIAE   Final  CULTURE, RESPIRATORY (NON-EXPECTORATED)     Status: None   Collection Time    04/02/13  8:34 AM      Result Value Ref Range Status   Specimen Description TRACHEAL ASPIRATE   Final   Special Requests NONE   Final   Gram Stain     Final   Value: ABUNDANT WBC PRESENT, PREDOMINANTLY PMN     MODERATE SQUAMOUS EPITHELIAL CELLS PRESENT     FEW GRAM POSITIVE COCCI IN PAIRS     IN CLUSTERS IN CHAINS FEW GRAM NEGATIVE COCCI     Performed at [facility]   Culture     Final   Value: Non-Pathogenic Oropharyngeal-type Flora Isolated.     Performed at [facility]   Report Status 04/04/2013 FINAL   Final    Scheduled Meds: . amiodarone  400 mg Oral BID  . atorvastatin  40 mg Oral Daily  . chlorhexidine  15 mL Mouth/Throat BID  . diltiazem  240 mg Oral Daily  . feeding supplement (ENSURE)  1 Container Oral TID BM  . hydrocortisone cream   Topical BID  . insulin aspart  0-9 Units  Subcutaneous TID WC  . pantoprazole  40 mg Oral Daily  . potassium chloride  10 mEq Intravenous Q1 Hr x 4  . potassium chloride  40 mEq Oral Once  . predniSONE  5 mg Oral Q breakfast  . rivaroxaban  20 mg Oral Q supper  . roflumilast  500 mcg Oral Daily  . sodium chloride  10-40 mL Intracatheter Q12H  . sodium chloride  3 mL Intravenous Q12H  . sodium chloride  3 mL Intravenous Q12H   Pamella Pert, MD Pager [phone] Time spent: 35 minutes If 7PM-7AM, please contact night-coverage [email or web address] Password [email or web address] 04/10/2013, 9:58 AM   LOS: 13 days

## 2013-04-10 NOTE — Progress Notes (Signed)
PARENTERAL NUTRITION CONSULT NOTE - FOLLOW UP  Pharmacy Consult for TPN Indication: inadequate PO intake, resolving SBO  Allergies  Allergen Reactions  . Corticosteroids     Inhaled Corticosteroids--Hoarseness, dry mouth  . Furosemide Other (See Comments)    Ototoxicity     Patient Measurements: Height: 5' 8.9" (175 cm) Weight: 191 lb (86.637 kg) IBW/kg (Calculated) : 70.47   Vital Signs: Temp: 98.3 F (36.8 C) (03/29 0348) Temp src: Oral (03/29 0348) BP: 112/68 mmHg (03/29 0348) Pulse Rate: 84 (03/29 0348) Intake/Output from previous day: 03/28 0701 - 03/29 0700 In: 240 [P.O.:240] Out: 575 [Urine:575] Intake/Output from this shift:    Labs:  Recent Labs  04/08/13 0500 04/09/13 0416 04/10/13 0530  WBC 8.8 11.2* 9.9  HGB 8.6* 8.5* 8.4*  HCT 27.9* 28.1* 27.7*  PLT 130* 125* 114*     Recent Labs  04/08/13 0500 04/09/13 0416 04/10/13 0530  NA 155* 155* 151*  K 3.0* 3.3* 3.4*  CL 118* 116* 114*  CO2 28 27 26   GLUCOSE 161* 188* 206*  BUN 13 19 25*  CREATININE 0.76 0.73 0.69  CALCIUM 8.6 8.9 8.7  MG 2.1 2.2 2.2  PHOS 2.1* 2.9 2.8  PROT 4.9* 5.1* 4.9*  ALBUMIN 2.3* 2.4* 2.3*  AST 34 32 27  ALT 39 42 38  ALKPHOS 70 68 63  BILITOT 0.3 0.3 0.3  PREALBUMIN 18.1  --   --   TRIG 122  --   --    Estimated Creatinine Clearance: 84.1 ml/min (by C-G formula based on Cr of 0.69).    Recent Labs  04/09/13 1627 04/09/13 2107 04/10/13 0605  GLUCAP 152* 207* 188*    Insulin Requirements in the past 24 hours:  6 units of Novolog  Current Nutrition:  Dysphagia 1 diet with poor PO intake documented Clinimix E 5/15 at 83 ml/hr + 20% lipid emulsion at 10 ml/hr.  This will provide 1894 kCal and 100 g protein per day.  Assessment: Admit: Transferred from [facility] with abdominal pain, partial SBO, ileus.  GI: resolved ileus per surgery.  Fall 2014 - volvulus, multiple abd surgeries, fistula development, was on TPN long-term but recently stopped and was  tolerating PO intake per report.  Now with poor PO intake on Dysphagia 1 diet.  Per MD will use TPN for support since not planning to place PEG due to anticoagulation (Xarelto). Please see RD note 3/27: recs for short-term enteral support via NGT is appropriate as he has a functioning GI tract. Given the goal is for him to attain adequate PO intake will not advance TPN to meet his full nutritional needs.  Nutrition: prealbumin 18.1 on 3/27, adequate.   Endo: CBGs variable, requiring some SSI. On PO prednisone  Lytes: Na 151 d/t dehydration, improving; K 3.4 after 40 mEq IV 3/28, Mag 2.2, phos 2.8  Renal: SCr 0.69 - stable  Pulm: Trach collar, daliresp  Cards: Afib - BP ok, tachycardia improved; diltiazem, on Xarelto  Hepatobil: last LFTs were normal, Albumin 2.3  Neuro: A&O  ID: completed a 7d course of ABX for HCAP 3/26  Best Practices: Xarelto for atrial fibrillation  TPN Access: PICC placed 3/22  TPN day#: 4  Nutritional Goals:  2050-2250 kCal, 110-120 grams of protein per day  Plan:  Continue Clinimix E 5/15 at 83 ml/hr + 20% lipid emulsion at 10 ml/hr.   Continue CBGs and SSI TID with meals Give KCL 10mEq IV x 4 runs Check TPN labs on Monday Follow PO  intake and potential feeding tube placement for enteral nutrition Consider replacing NGT and feeding enterally as his gut works.  Estella Husk, Pharm.D., BCPS, AAHIVP Clinical Pharmacist Phone: [phone] or [phone] 04/10/2013, 8:18 AM

## 2013-04-11 ENCOUNTER — Inpatient Hospital Stay (HOSPITAL_COMMUNITY): Payer: Medicare HMO

## 2013-04-11 LAB — DIFFERENTIAL
BASOS ABS: 0 10*3/uL (ref 0.0–0.1)
BASOS PCT: 0 % (ref 0–1)
EOS ABS: 0.1 10*3/uL (ref 0.0–0.7)
Eosinophils Relative: 1 % (ref 0–5)
LYMPHS ABS: 0.9 10*3/uL (ref 0.7–4.0)
LYMPHS PCT: 9 % — AB (ref 12–46)
Monocytes Absolute: 0.7 10*3/uL (ref 0.1–1.0)
Monocytes Relative: 7 % (ref 3–12)
Neutro Abs: 8.6 10*3/uL — ABNORMAL HIGH (ref 1.7–7.7)
Neutrophils Relative %: 83 % — ABNORMAL HIGH (ref 43–77)

## 2013-04-11 LAB — COMPREHENSIVE METABOLIC PANEL
ALBUMIN: 2.3 g/dL — AB (ref 3.5–5.2)
ALK PHOS: 68 U/L (ref 39–117)
ALT: 33 U/L (ref 0–53)
AST: 19 U/L (ref 0–37)
BILIRUBIN TOTAL: 0.4 mg/dL (ref 0.3–1.2)
BUN: 25 mg/dL — ABNORMAL HIGH (ref 6–23)
CHLORIDE: 114 meq/L — AB (ref 96–112)
CO2: 26 meq/L (ref 19–32)
Calcium: 8.8 mg/dL (ref 8.4–10.5)
Creatinine, Ser: 0.57 mg/dL (ref 0.50–1.35)
GFR calc Af Amer: >90 mL/min (ref >90)
Glucose, Bld: 221 mg/dL — ABNORMAL HIGH (ref 70–99)
POTASSIUM: 3.6 meq/L — AB (ref 3.7–5.3)
SODIUM: 152 meq/L — AB (ref 137–147)
Total Protein: 5.1 g/dL — ABNORMAL LOW (ref 6.0–8.3)

## 2013-04-11 LAB — BASIC METABOLIC PANEL
BUN: 25 mg/dL — ABNORMAL HIGH (ref 6–23)
CALCIUM: 8.8 mg/dL (ref 8.4–10.5)
CHLORIDE: 113 meq/L — AB (ref 96–112)
CO2: 27 mEq/L (ref 19–32)
Creatinine, Ser: 0.6 mg/dL (ref 0.50–1.35)
GFR calc Af Amer: >90 mL/min (ref >90)
GFR calc non Af Amer: >90 mL/min (ref >90)
GLUCOSE: 220 mg/dL — AB (ref 70–99)
Potassium: 3.6 mEq/L — ABNORMAL LOW (ref 3.7–5.3)
Sodium: 151 mEq/L — ABNORMAL HIGH (ref 137–147)

## 2013-04-11 LAB — GLUCOSE, CAPILLARY
GLUCOSE-CAPILLARY: 195 mg/dL — AB (ref 70–99)
GLUCOSE-CAPILLARY: 167 mg/dL — AB (ref 70–99)
Glucose-Capillary: 188 mg/dL — ABNORMAL HIGH (ref 70–99)
Glucose-Capillary: 209 mg/dL — ABNORMAL HIGH (ref 70–99)

## 2013-04-11 LAB — PROTIME-INR
INR: 1.14 (ref 0.00–1.49)
Prothrombin Time: 14.4 seconds (ref 11.6–15.2)

## 2013-04-11 LAB — CBC
HCT: 28.5 % — ABNORMAL LOW (ref 39.0–52.0)
HEMOGLOBIN: 8.7 g/dL — AB (ref 13.0–17.0)
MCH: 30.1 pg (ref 26.0–34.0)
MCHC: 30.5 g/dL (ref 30.0–36.0)
MCV: 98.6 fL (ref 78.0–100.0)
Platelets: 114 10*3/uL — ABNORMAL LOW (ref 150–400)
RBC: 2.89 MIL/uL — ABNORMAL LOW (ref 4.22–5.81)
RDW: 21.5 % — AB (ref 11.5–15.5)
WBC: 10.3 10*3/uL (ref 4.0–10.5)

## 2013-04-11 LAB — MAGNESIUM: Magnesium: 2.2 mg/dL (ref 1.5–2.5)

## 2013-04-11 LAB — PHOSPHORUS: Phosphorus: 3.1 mg/dL (ref 2.3–4.6)

## 2013-04-11 LAB — TRIGLYCERIDES: Triglycerides: 86 mg/dL (ref <150)

## 2013-04-11 LAB — PREALBUMIN: Prealbumin: 11.4 mg/dL — ABNORMAL LOW (ref 17.0–34.0)

## 2013-04-11 MED ORDER — SODIUM CHLORIDE 0.9 % IV SOLN: 250.0000 mL | INTRAVENOUS | Status: DC

## 2013-04-11 MED ORDER — M.V.I. ADULT IV INJ
INTRAVENOUS | Status: AC
  Administered 2013-04-11: 18:00:00 via INTRAVENOUS
  Filled 2013-04-11: qty 2000

## 2013-04-11 MED ORDER — SODIUM CHLORIDE 0.9 % IV SOLN
INTRAVENOUS | Status: AC
  Administered 2013-04-13: 02:00:00 via INTRAVENOUS

## 2013-04-11 MED ORDER — DEXTROSE 5 % IV SOLN
INTRAVENOUS | Status: AC
  Administered 2013-04-11: 50 mL via INTRAVENOUS

## 2013-04-11 MED ORDER — SODIUM CHLORIDE 0.9 % IJ SOLN: 3.0000 mL | INTRAMUSCULAR | Status: DC | PRN

## 2013-04-11 MED ORDER — SODIUM CHLORIDE 0.9 % IJ SOLN
3.0000 mL | Freq: Two times a day (BID) | INTRAMUSCULAR | Status: DC
  Administered 2013-04-12 – 2013-04-14 (×2): 3 mL via INTRAVENOUS

## 2013-04-11 MED ORDER — FAT EMULSION 20 % IV EMUL
250.0000 mL | INTRAVENOUS | Status: AC
  Administered 2013-04-11: 250 mL via INTRAVENOUS
  Filled 2013-04-11: qty 250

## 2013-04-11 NOTE — Progress Notes (Signed)
Pt. being transported to Xray @ this time RN s/u transport Trach mask@ 4l 30%.

## 2013-04-11 NOTE — Progress Notes (Signed)
CSW and CM working together on discharge plan. CM is checking with [name] today regarding MD request. CSW continuing to assist and follow  Tim Short, MSW, [place] [phone]

## 2013-04-11 NOTE — Procedures (Signed)
Objective Swallowing Evaluation: Modified Barium Swallowing Study  Patient Details  Name: ELPIDIO THIELEN MRN: [id number] Date of Birth: [birth year]  Today's Date: 04/11/2013 Time: 4098-1191 SLP Time Calculation (min): 25 min  Past Medical History:  Past Medical History  Diagnosis Date  . Hypertension   . Hypercholesteremia   . Asthma   . Meniere disease   . Persistent atrial fibrillation     a. s/p multiple dccv's;  b. failed amio;  c. not felt to be AF RFCA canddiate due to LA dil;  d. s/p failed hybrid ablation @ [facility] in 09/2012;  e. chronic pradaxa.  . Obesity   . Biatrial enlargement     LA size 5.3cm  . Obstructive sleep apnea     AHI 108/hr now on CPAP at 12cm H2O  . H/O hiatal hernia   . GERD (gastroesophageal reflux disease)     "associated w/hiatal hernia" (05-Jun-2012)  . Peptic ulcer 1950's  . Migraines     "haven't had one for about 15 years" (06/05/12)  . Arthritis     "joints" (2012/06/05)  . Chronic lower back pain   . Nephrolithiasis ~ 1960's    "passed on their own" (06-05-12)  . History of pneumonia 2002; 2006    "spent 8 days in isolation; Norovirus" (06-05-12)  . Other and unspecified angina pectoris   . RLS (restless legs syndrome)   . Coronary artery disease     a. 05/2012 Cath/PCI: LM nl, LAD nl, LCX 67m (3.0x15 Integrity BMS), PTCA of OM1 through stent struts (kissing balloon).  . Diabetes mellitus without complication     FAMILY STATES PATIENT IS NOT DIABETIC   Past Surgical History:  Past Surgical History  Procedure Laterality Date  . Wrist fracture surgery  1992    "repaired w/left hip bone graft" (Jun 05, 2012)  . Total knee arthroplasty  08/19/1999  . Colonoscopy w/ polypectomy  2006  . Knee arthroscopy with meniscal repair Right 1974    "medial meniscus repaired" (2012/06/05)  . Cardioversion  10/02/2011    Procedure: CARDIOVERSION;  Surgeon: Corky Crafts, MD;  Location: [facility] ENDOSCOPY;  Service: Cardiovascular;  Laterality: N/A;  h/p in file  drawer  . Cardioversion  11/07/2011    Procedure: CARDIOVERSION;  Surgeon: Corky Crafts, MD;  Location: [facility] ENDOSCOPY;  Service: Cardiovascular;  Laterality: N/A;  h/p from 10/22 in file drawer/dl  . Cardiac catheterization  05/26/2012  . Coronary angioplasty with stent placement  06-05-2012    "1" (2012/06/05)  . Cataract extraction w/ intraocular lens  implant, bilateral  2009  . Hemorrhoid surgery  ~ 2003  . Hiatal hernia repair  1983  . Nissen fundoplication  1983  . Ablation of dysrhythmic focus  SEPT/OCT 2014    ATRIAL FIB  . Inguinal hernia repair Bilateral 2000 and 2005    "one done at a time" (Jun 05, 2012)  . Colon surgery  10/08/2012   . 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 Pt was in prolonged respiratory failure at [facility], unable to wean of the vent and requiring trach placement 10/29/2012. He was transferred to [facility] in December 2014 and later to [facility] in late January 2015. He was on TNA and was doing fairly well initially, but developed more abdominal pain and  on 2/23 [place] in [place] for gallbladder removal (secondary to cholecystitis), resection for part of the colon for fistula, IVC filter placement. Sent back to [facility] on march 4th, 2015 and started on TPN. Diet advanced on March 9th, 2015 after pt passed swallow evaluation. [facility], after noticing progressive abdominal distension. In ED, CT abdomen with air- fluid level and findings consistent with early partial SBO, ileus. Surgery reports pt is ok for PO from their standpoint, SLP ordered for swallow eval.      Assessment / Plan / Recommendation Clinical Impression  Dysphagia Diagnosis: Moderate pharyngeal  phase dysphagia Clinical impression: Pt demonstrates significant improvement in motor function since last MBS. Pts primary decifits are now exclusively sensory in nature. Pt has a delay in swallow initiation with nectar and thin teaspoon amounts with aspiration before the swallow. With a chin tuck, the pt is albe to protect the airway before the swallow with nectar vea teaspoon or straw. Pt is also able to masticate soft and regular textured solids. He was noted to have expectoration of mucous/barium after the test, possible esophageal component not visualized due to body habitus. Pt is recommended to upgrade to a dys 2 Finely chopped diet with nectar thick liquids via spoon or straws with full supervison for a chin tuck and placement of PMSV. Pt may upgrade solids if he tolerates dys 2 diet.     Treatment Recommendation  Therapy as outlined in treatment plan below    Diet Recommendation Dysphagia 2 (Fine chop);Nectar-thick liquid   Liquid Administration via: Spoon;Straw Medication Administration: Crushed with puree Supervision: Patient able to self feed;Full supervision/cueing for compensatory strategies Compensations: Slow rate;Small sips/bites Postural Changes and/or Swallow Maneuvers: Chin tuck;Seated upright 90 degrees    Other  Recommendations Oral Care Recommendations: Oral care BID Other Recommendations: Place PMSV during PO intake;Order thickener from pharmacy;Have oral suction available   Follow Up Recommendations  Skilled Nursing facility    Frequency and Duration min 2x/week  2 weeks   Pertinent Vitals/Pain NA    SLP Swallow Goals     General HPI: Pt is 79 yo male with [facility] failed maize procedure and during that hospitalization pt developed volvulus and has required right colectomy (10/08/2012), subsequently developed multiple abscesses and enteric fistulas managed percutaneous drains. [facility], unable to wean of the vent and requiring trach placement 10/29/2012. He was transferred to [facility] in December 2014 and later to [facility] in late January 2015. He was on TNA and was doing fairly well initially, but developed more abdominal pain and on 2/23 taken to OR in [place] for gallbladder removal (secondary to cholecystitis), resection for part of the colon for fistula, IVC filter placement. Sent back to [facility] on march 4th, 2015 and started on TPN. Diet advanced on March 9th, 2015 after pt passed swallow evaluation. [facility] after noticing progressive abdominal distension. In ED, CT abdomen with air- fluid level and findings consistent with early partial SBO, ileus. Surgery reports pt is ok for PO from their standpoint, SLP ordered for swallow eval.  Type of Study: Modified Barium Swallowing Study Reason for Referral: Objectively evaluate swallowing function Previous Swallow Assessment: [facility] hospital - 3/16 pt "passed" FEES and was started on diet, began to decline and blue dye test was given, pt silently aspirated thin, nectar, honey per report.  Diet Prior to this Study: Dysphagia  1 (puree);Pudding-thick liquids Temperature Spikes Noted: No Respiratory Status: Trach collar Trach Size and Type: Cuff;Deflated;With PMSV in place;#6;Other (Comment) History of Recent Intubation: No Behavior/Cognition: Alert;Cooperative;Requires cueing;Hard of hearing Oral Cavity - Dentition: Missing dentition Oral Motor / Sensory Function: Impaired - see Bedside swallow eval Self-Feeding Abilities: Able to feed self;Needs assist Patient Positioning: Upright in chair Baseline Vocal Quality: Low vocal intensity Volitional Cough: Strong Volitional Swallow: Able to elicit Anatomy: Within functional limits Pharyngeal Secretions: Not observed secondary MBS    Reason for Referral Objectively evaluate swallowing function   Oral Phase Oral Preparation/Oral Phase Oral Phase:  Impaired Oral Phase - Comment Oral Phase - Comment: missing dentition, but places boluse effectively between remaining teeth. otherwise WFL.    Pharyngeal Phase Pharyngeal Phase Pharyngeal Phase: Impaired Pharyngeal - Honey Pharyngeal - Honey Teaspoon: Not tested Pharyngeal - Honey Cup: Not tested Pharyngeal - Nectar Pharyngeal - Nectar Teaspoon: Delayed swallow initiation;Penetration/Aspiration before swallow (chin tuck increases airway closure, preents penetration) Penetration/Aspiration details (nectar teaspoon): Material enters airway, passes BELOW cords without attempt by patient to eject out (silent aspiration);Material does not enter airway Pharyngeal - Nectar Straw: Delayed swallow initiation (chin tuck) Pharyngeal - Thin Pharyngeal - Thin Teaspoon: Delayed swallow initiation;Penetration/Aspiration before swallow (chin tuck) Penetration/Aspiration details (thin teaspoon): Material enters airway, CONTACTS cords and not ejected out Pharyngeal - Solids Pharyngeal - Puree: Delayed swallow initiation (chin tuck) Pharyngeal - Mechanical Soft: Delayed swallow initiation Pharyngeal - Regular: Delayed swallow initiation  Cervical Esophageal Phase    GO             Harlon Ditty, MA CCC-SLP [phone]  Claudine Mouton 04/11/2013, 3:08 PM

## 2013-04-11 NOTE — Progress Notes (Signed)
PARENTERAL NUTRITION CONSULT NOTE - FOLLOW UP  Pharmacy Consult for TPN Indication: inadequate PO intake, resolving SBO  Allergies  Allergen Reactions  . Corticosteroids     Inhaled Corticosteroids--Hoarseness, dry mouth  . Furosemide Other (See Comments)    Ototoxicity     Patient Measurements: Height: 5' 8.9" (175 cm) Weight: 186 lb (84.369 kg) IBW/kg (Calculated) : 70.47   Vital Signs: Temp: 97.9 F (36.6 C) (03/30 0445) Temp src: Oral (03/30 0445) BP: 131/66 mmHg (03/30 0445) Pulse Rate: 86 (03/30 0509) Intake/Output from previous day: 03/29 0701 - 03/30 0700 In: 0  Out: 400 [Urine:400] Intake/Output from this shift:    Labs:  Recent Labs  04/09/13 0416 04/10/13 0530 04/11/13 0456  WBC 11.2* 9.9 10.3  HGB 8.5* 8.4* 8.7*  HCT 28.1* 27.7* 28.5*  PLT 125* 114* 114*     Recent Labs  04/09/13 0416 04/10/13 0530 04/11/13 0456  NA 155* 151* 152*  K 3.3* 3.4* 3.6*  CL 116* 114* 114*  CO2 27 26 26   GLUCOSE 188* 206* 221*  BUN 19 25* 25*  CREATININE 0.73 0.69 0.57  CALCIUM 8.9 8.7 8.8  MG 2.2 2.2 2.2  PHOS 2.9 2.8 3.1  PROT 5.1* 4.9* 5.1*  ALBUMIN 2.4* 2.3* 2.3*  AST 32 27 19  ALT 42 38 33  ALKPHOS 68 63 68  BILITOT 0.3 0.3 0.4  TRIG  --   --  86   Estimated Creatinine Clearance: 77.1 ml/min (by C-G formula based on Cr of 0.57).    Recent Labs  04/10/13 1122 04/10/13 1704 04/10/13 2109  GLUCAP 176* 189* 203*    Insulin Requirements in the past 24 hours:  6 units of Novolog  Current Nutrition:  Dysphagia 1 diet with poor PO intake documented (0-25%) Clinimix E 5/15 at 83 ml/hr + 20% lipid emulsion at 10 ml/hr.  This provides 1894 kCal and 100 g protein per day.  Assessment: Admit: Transferred from [facility] with abdominal pain, partial SBO, ileus.  GI: resolved ileus per surgery.  Fall 2014 - volvulus, multiple abd surgeries, fistula development, was on TPN long-term but recently stopped and was tolerating PO intake per report.  Now  with poor PO intake on Dysphagia 1 diet.  Per MD will use TPN for support since not planning to place PEG due to anticoagulation (Xarelto). Noted pt with diarrhea 3/28 but improved 3/29. Cdiff negative. Please see RD note 3/27: recs for short-term enteral support via NGT is appropriate as he has a functioning GI tract. Prealbumin 18.1 (wnl) on 3/27.   Endo: CBGs >180, requiring some SSI. On PO prednisone  Lytes: Na 152 (probably d/t fluid status); K 3.6, other lytes wnl. I/Os not being charted accurately.  Renal: SCr stable  Pulm: Trach collar, daliresp  Cards: Afib - VSS (NSR s/p cardioversion 3/27); diltiazem, on Xarelto  Hepatobil: LFTs wnl, TG wnl, Albumin 2.3  Neuro: A&O  ID: completed a 7d course of ABX for HCAP 3/26  Best Practices: Xarelto for atrial fibrillation  TPN Access: PICC placed 3/22  TPN day#: 5  Nutritional Goals:  2050-2250 kCal, 110-120 grams of protein per day  Plan:  1) Continue Clinimix E 5/15 at 83 ml/hr + 20% lipid emulsion at 10 ml/hr. This meets >90% minimum protein and kcal needs.    2) F/u po intake and ability to being to wean TPN. 3) Continue CBGs and SSI TID with meals. Will add small amount of insulin to TPN. 4) F/u prealbumin, TPN labs. If Na  remains high, may need to try adding D5 in case pt is dry. 5) Consider replacing NGT and feeding enterally as his gut works. TPN not appropriate nutrition support in this pt with functioning GI tract.  Christoper Fabianaron Daelyn Mozer, PharmD, BCPS Clinical pharmacist, pager [phone] 04/11/2013, 7:57 AM

## 2013-04-11 NOTE — Progress Notes (Signed)
Patient ID: Tim Short, male   DOB: Jun 26, 1934, 79 y.o.   MRN: [id number]  [facility] PROGRESS NOTE  Tim Short [id number] DOB: [birth year] DOA: 03/28/2013 PCP:  Duane Lope, MD  Atrial fibrillation/flutter - rate in 120's - 130's while on medications, cardiology following, now s/p cardioversion 3/27, restored sinus rhythm and maintained over the weekend however 3/30 am it looks like he is back in A fib with RVR, rates in the 130s. Cardiology called, appreciate input.  Abdominal pain - secondary to ileus, partial SBO, now resolved, patient with good bowel movement. He has a degree of abdominal pain which is stable. No nausea/vomiting and eating as tolerated. Had few diarrheal episodes yesterday, C diff negative.  - continue supportive care with analgesia, antiemetics as needed for symptom control. - nutrition consulted, appreciate input, patient is not meeting his nutritional needs based on oral intake.  - patient with intermittent confusion which is now better, and on anticoagulation for his A fib, will hold off PEG tube placement. TPN initiated 3/26 - continue oral intake as tolerated.  Moderate malnutrition  - secondary to acute on chronic illness as outlined above  - per SLP, pudding thick liquids allowed  - pt tolerating well however does not seem to meet his nutritional requirements. - on TPN. Ongoing SLP evaluation today for progress.  Leukocytosis  - likely secondary to ileus, ? HCAP as suggestive per CXR 3/20  - CT abd/pelvis with mild leak around anastomosis but no other acute events HCAP - s/p complete 7 day treatment initially with Vancomycin and Maxipime for 5 days then transitioned to for 2 more days, finished antibiotics on 3/26.  Pulmonary vascular congestion - lung exam improving and stable.  - IVF stopped 3/20 due to pulmonary vascular congestion  - monitor I's/O's, daily weights  - weight trend: 203 lbs --> 195 --> 192 >> 194 >> 191 - repeat CXR  today Hypokalemia - continue supplementation. Magnesium normal. Slowly improving.  Sacral ulcer - skin breakdown noted, closely monitor. Nursing care. No evidence of infection.  Anemia of chronic disease - no signs of active bleeding. Mild thrombocytopenia, stable. Essential hypertension - within normal limits today Tracheostomy status - suctioning as needed  Hypernatremia - TPN per pharmacy. Improving.   Consultants:  Surgery  Cardiology   Procedures/Studies:  Ct Abdomen Pelvis W Contrast 03/28/2013 Findings consistent with persistent phlegmon in the liver. Interim removal of right upper quadrant drainage catheter. Colonic distention with air-fluid levels. Slight progression of soft tissue fluid in the anterior abdomen in the region the patient's scarring. 13 mm low-density lesion left kidney, not definite simple cyst.  Dg Abd 2 Views 03/30/2013 Mildly dilated loops of left upper quadrant small bowel with scattered internal air-fluid levels, similar to the prior exam, with abnormal but nonspecific pattern which could be due to could ileus or partial SBO.  Dg Abd 2 View 03/29/2013 Nonspecific bowel gas pattern, as above, suggestive of partial small bowel obstruction. No pneumoperitoneum.  Dg Abd Acute W/chest 03/28/2013 Persistent small bowel dilatation compatible with either partial SBO or ileus. No change in aeration to the lungs compared with previous exam. Antibiotics:  Vancomycin 3/20 --> 3/24 Maxipime 3/20 --> 3/24 Levaquin 3/24 --> 3/26  Code Status: Full  Family Communication: d/w patient this morning Disposition Plan: Remains inpatient, LTACH vs SNF  HPI/Subjective: No events overnight, alert this morning, appropriate.   Objective: Filed Vitals:   04/11/13 0445 04/11/13 0500 04/11/13 0509 04/11/13 0900  BP: 131/66  Pulse: 89  86 92  Temp: 97.9 F (36.6 C)     TempSrc: Oral     Resp: 18  18 20   Height:      Weight:  84.369 kg (186 lb)    SpO2: 98%  96% 96%     Intake/Output Summary (Last 24 hours) at 04/11/13 1127 Last data filed at 04/11/13 0200  Gross per 24 hour  Intake      0 ml  Output    400 ml  Net   -400 ml   Exam:  General:  Pt is alert, follows commands appropriately, not in acute distress, trach in place   Cardiovascular: regular rhythm, S1/S2, no murmurs, no rubs, no gallops  Respiratory: Clear to auscultation bilaterally, no wheezing, mild bibasilar rhonchi  Abdomen: Soft, non tender, non distended, bowel sounds present, no guarding  Extremities: No edema, pulses DP and PT palpable bilaterally  Data Reviewed: Basic Metabolic Panel:  Recent Labs Lab 04/06/13 1430 04/07/13 1150 04/08/13 0500 04/09/13 0416 04/10/13 0530 04/11/13 0456  NA  --  158* 155* 155* 151* 152*  K 3.3* 3.3* 3.0* 3.3* 3.4* 3.6*  CL  --  119* 118* 116* 114* 114*  CO2  --  28 28 27 26 26   GLUCOSE  --  126* 161* 188* 206* 221*  BUN  --  11 13 19  25* 25*  CREATININE  --  0.76 0.76 0.73 0.69 0.57  CALCIUM  --  8.7 8.6 8.9 8.7 8.8  MG 2.2  --  2.1 2.2 2.2 2.2  PHOS  --   --  2.1* 2.9 2.8 3.1   CBC:  Recent Labs Lab 04/07/13 1150 04/08/13 0500 04/09/13 0416 04/10/13 0530 04/11/13 0456  WBC 9.7 8.8 11.2* 9.9 10.3  NEUTROABS  --  6.9  --   --  8.6*  HGB 9.0* 8.6* 8.5* 8.4* 8.7*  HCT 29.5* 27.9* 28.1* 27.7* 28.5*  MCV 98.7 98.9 98.6 98.9 98.6  PLT 147* 130* 125* 114* 114*   CBG:  Recent Labs Lab 04/09/13 2107 04/10/13 0605 04/10/13 1122 04/10/13 1704 04/10/13 2109  GLUCAP 207* 188* 176* 189* 203*   Recent Results (from the past 240 hour(s))  CULTURE, RESPIRATORY (NON-EXPECTORATED)     Status: None   Collection Time    04/01/13  3:25 PM      Result Value Ref Range Status   Specimen Description TRACHEAL ASPIRATE   Final   Special Requests NONE   Final   Gram Stain     Final   Value: FEW WBC PRESENT,BOTH PMN AND MONONUCLEAR     NO SQUAMOUS EPITHELIAL CELLS SEEN     FEW GRAM POSITIVE RODS     RARE YEAST     Performed at  [facility]   Culture     Final   Value: FEW KLEBSIELLA PNEUMONIAE     Performed at [facility]   Report Status 04/03/2013 FINAL   Final   Organism ID, Bacteria KLEBSIELLA PNEUMONIAE   Final  CULTURE, RESPIRATORY (NON-EXPECTORATED)     Status: None   Collection Time    04/02/13  8:34 AM      Result Value Ref Range Status   Specimen Description TRACHEAL ASPIRATE   Final   Special Requests NONE   Final   Gram Stain     Final   Value: ABUNDANT WBC PRESENT, PREDOMINANTLY PMN     MODERATE SQUAMOUS EPITHELIAL CELLS PRESENT     FEW GRAM POSITIVE COCCI IN  PAIRS     IN CLUSTERS IN CHAINS FEW GRAM NEGATIVE COCCI     Performed at [facility]   Culture     Final   Value: Non-Pathogenic Oropharyngeal-type Flora Isolated.     Performed at [facility]   Report Status 04/04/2013 FINAL   Final  CLOSTRIDIUM DIFFICILE BY PCR     Status: None   Collection Time    04/10/13  9:03 AM      Result Value Ref Range Status   C difficile by pcr NEGATIVE  NEGATIVE Final    Scheduled Meds: . amiodarone  400 mg Oral BID  . atorvastatin  40 mg Oral Daily  . chlorhexidine  15 mL Mouth/Throat BID  . diltiazem  240 mg Oral Daily  . feeding supplement (ENSURE)  1 Container Oral TID BM  . hydrocortisone cream   Topical BID  . insulin aspart  0-9 Units Subcutaneous TID WC  . pantoprazole  40 mg Oral Daily  . potassium chloride  40 mEq Oral Once  . predniSONE  5 mg Oral Q breakfast  . rivaroxaban  20 mg Oral Q supper  . roflumilast  500 mcg Oral Daily  . sodium chloride  10-40 mL Intracatheter Q12H  . sodium chloride  3 mL Intravenous Q12H  . sodium chloride  3 mL Intravenous Q12H   Pamella Pert, MD Pager [phone] Time spent: 25 minutes If 7PM-7AM, please contact night-coverage [email or web address] Password [email or web address] 04/11/2013, 11:27 AM   LOS: 14 days

## 2013-04-11 NOTE — Progress Notes (Signed)
Physical Therapy Treatment Patient Details Name: Tim Short MRN: [id number] DOB: 12/30/1934 Today's Date: 04/11/2013    History of Present Illness 79 yo male with recent complications at [facility] after an ablation for afib in oct 2014 he developed a volvulus after the ablation was intubated/trachedm required multiple abd surgeries then developed fistula details unknown.  Has been on tpn, with ngt on/off since. Is at [facility] sent here for complaints of abd pain.  Pt was taken off tpn and feeding tube was removed last week.  He has been eating by mouth and doing well.  But last 4 days with worsening abd pain, no n/v/d.  No fevers (however is was started on vanc and zosyn for unknown reasons).  Pt na level is elevated, and appears dehydrated. 04/08/13 Pt now reports that he is having vertigo which has ebbed and flowed for the  past 14 years.    PT Comments    Extremely weak due to not eating and lately not feeling well enough to participate in therapy.  Vertigo remains, will try to recheck and treat L side for BPPV.  Follow Up Recommendations  SNF     Equipment Recommendations       Recommendations for Other Services       Precautions / Restrictions Precautions Precautions: Fall Precaution Comments: vertigo    Mobility  Bed Mobility Overal bed mobility: Needs Assistance Bed Mobility: Supine to Sit Rolling: Mod assist         General bed mobility comments: truncal assist to EOB  Transfers Overall transfer level: Needs assistance Equipment used: Rolling walker (2 wheeled) Transfers: Sit to/from Stand Sit to Stand: Max assist;+2 physical assistance         General transfer comment: Unable to attain full erect stand without use of the RW.  First attempt tried 2 person stand from the sides for pericare and pt unable to hold the length of time to clean up.  Ambulation/Gait                 Stairs            Wheelchair Mobility    Modified Rankin (Stroke  Patients Only)       Balance Overall balance assessment: Needs assistance Sitting-balance support: Feet supported;Single extremity supported Sitting balance-[name] Scale: Poor Sitting balance - Comments: tendency to fall backward due to dynamic mattress needing min assist   Standing balance support: During functional activity;Bilateral upper extremity supported Standing balance-[name] Scale: Zero                      Cognition Arousal/Alertness: Awake/alert Behavior During Therapy: WFL for tasks assessed/performed Overall Cognitive Status: Within Functional Limits for tasks assessed                      Exercises      General Comments General comments (skin integrity, edema, etc.): Some vertigo remains per pt, but not nearly as much as before vestibular treatment for R BPPV.  Will now need to treat the L side.      Pertinent Vitals/Pain     Home Living                      Prior Function            PT Goals (current goals can now be found in the care plan section) Acute Rehab PT Goals Patient Stated Goal: To get back home PT Goal  Formulation: With patient Time For Goal Achievement: 04/19/13 Potential to Achieve Goals: Good Progress towards PT goals: Not progressing toward goals - comment (likely due to not eating and frequent stools)    Frequency  Min 3X/week    PT Plan Current plan remains appropriate    End of Session Equipment Utilized During Treatment: Oxygen Activity Tolerance: Patient tolerated treatment well;Other (comment) Patient left: in bed;with call bell/phone within reach     Time: 1711-1730 PT Time Calculation (min): 19 min  Charges:  $Therapeutic Activity: 8-22 mins                    G Codes:      Tim Short, Eliseo Gum 04/11/2013, 5:43 PM 04/11/2013  [place] Bing, PT [phone]  (pager)

## 2013-04-11 NOTE — Progress Notes (Signed)
Patient's spo2 dropped to 82%. Rn suctioned patient. Patient spo2 now 94%. Patient's hrt rate sustained at 130-140. Patient given 10mg  Iv metoprolol at 2115. 30 minutes later hrt rate 116. Will continue to monitor.   Velencia Lenart,MSN,RN

## 2013-04-11 NOTE — Progress Notes (Addendum)
Speech Language Pathology Treatment: Tim Short Speaking valve  Patient Details Name: Tim Short MRN: [id number] DOB: 10/27/1934 Today's Date: 04/11/2013 Time: [id number] SLP Time Calculation (min): 8 min  Assessment / Plan / Recommendation Clinical Impression  Pt tolerated PMSV placement for brief communicative exchange, but refused to wear for longer period stating it is irritating. Refused PO stating he is nauseated and dizzy and doesn't like the food. Attempted to explain that I can make the food better if he will do a modified barium swallow to assess his swallowing. Pt agreed, but doubt the extent of his understanding. Will attempt test again as pt is unlikely to have a "good day" and c/o nausea is baseline. Perhaps with better food choices his intake will improve. Will plan to take him today at 1 pm.    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 [facility], unable to wean of the vent and requiring trach placement 10/29/2012. He was transferred to [facility] in December 2014 and later to [facility] in late January 2015. He was on TNA and was doing fairly well initially, but developed more abdominal pain and on 2/23 taken to OR in [place] for gallbladder removal (secondary to cholecystitis), resection for part of the colon for fistula, IVC filter placement. Sent back to [facility] on march 4th, 2015 and started on TPN. Diet advanced on March 9th, 2015 after pt passed swallow evaluation. [facility] after noticing progressive abdominal distension. In ED, CT abdomen with air- fluid level and findings consistent with early partial SBO, ileus. Surgery reports pt is ok for PO from their standpoint,  SLP ordered for swallow eval.    Pertinent Vitals NA  SLP Plan  MBS    Recommendations Diet recommendations: Pudding-thick liquid Medication Administration: Crushed with puree              Oral Care Recommendations: Oral care BID Follow up Recommendations: Skilled Nursing facility Plan: MBS    GO    Tim Short, [place] CCC-SLP [phone]  Tim Short, Tim Short 04/11/2013, 10:40 AM

## 2013-04-11 NOTE — Progress Notes (Signed)
Patient: Tim Short Date of Encounter: 04/11/2013, 1:20 PM Admit date: 03/28/2013     Subjective  Tim Short reports his heart is racing but denies CP or SOB.   Restarted PO amiodarone 03/22 >> DCCV 04/08/2013 successful for restoring SR.  Now back in AFib w/RVR this AM, V rate 110-130 bpm.    Objective  Physical Exam: Vitals: BP 131/66  Pulse 88  Temp(Src) 97.9 F (36.6 C) (Oral)  Resp 22  Ht 5' 8.9" (1.75 m)  Wt 186 lb (84.369 kg)  BMI 27.55 kg/m2  SpO2 96% General: Well developed, chronically ill appearing 79 year old male in no acute distress. Neck: Supple. Trach collar in place. Lungs: Clear bilaterally to auscultation without wheezes, rales, or rhonchi. Breathing is unlabored. Heart: Irregular, tachycardic S1 S2 without murmurs, rubs, or gallops.  Abdomen: Soft, non-distended. Extremities: No clubbing or cyanosis. No edema.  Distal pedal pulses are 2+ and equal bilaterally. Neuro: Alert and oriented X 3. Moves all extremities spontaneously. No focal deficits.  Intake/Output:  Intake/Output Summary (Last 24 hours) at 04/11/13 1320 Last data filed at 04/11/13 0200  Gross per 24 hour  Intake      0 ml  Output    400 ml  Net   -400 ml    Inpatient Medications:  . amiodarone  400 mg Oral BID  . atorvastatin  40 mg Oral Daily  . chlorhexidine  15 mL Mouth/Throat BID  . diltiazem  240 mg Oral Daily  . feeding supplement (ENSURE)  1 Container Oral TID BM  . hydrocortisone cream   Topical BID  . insulin aspart  0-9 Units Subcutaneous TID WC  . pantoprazole  40 mg Oral Daily  . potassium chloride  40 mEq Oral Once  . predniSONE  5 mg Oral Q breakfast  . rivaroxaban  20 mg Oral Q supper  . roflumilast  500 mcg Oral Daily  . sodium chloride  10-40 mL Intracatheter Q12H  . sodium chloride  3 mL Intravenous Q12H  . sodium chloride  3 mL Intravenous Q12H   . sodium chloride    . dextrose 50 mL (04/11/13 1119)  . Marland KitchenTPN (CLINIMIX-E) Adult 83 mL/hr at 04/10/13 1722     And  . fat emulsion 250 mL (04/10/13 1722)  . Marland KitchenTPN (CLINIMIX-E) Adult     And  . fat emulsion      Labs:  Recent Labs  04/10/13 0530 04/11/13 0456  NA 151* 152*  K 3.4* 3.6*  CL 114* 114*  CO2 26 26  GLUCOSE 206* 221*  BUN 25* 25*  CREATININE 0.69 0.57  CALCIUM 8.7 8.8  MG 2.2 2.2  PHOS 2.8 3.1    Recent Labs  04/10/13 0530 04/11/13 0456  AST 27 19  ALT 38 33  ALKPHOS 63 68  BILITOT 0.3 0.4  PROT 4.9* 5.1*  ALBUMIN 2.3* 2.3*    Recent Labs  04/10/13 0530 04/11/13 0456  WBC 9.9 10.3  NEUTROABS  --  8.6*  HGB 8.4* 8.7*  HCT 27.7* 28.5*  MCV 98.9 98.6  PLT 114* 114*    Radiology/Studies: Dg Chest Port 1 View  04/10/2013   CLINICAL DATA:  Cough and congestion.  EXAM: PORTABLE CHEST - 1 VIEW  COMPARISON:  DG CHEST 1V PORT dated 04/03/2013  FINDINGS: Tracheostomy and left-sided PICC line positioning are stable. The right jugular central line has been removed since the prior chest x-ray. Lungs show lower volumes bilaterally with increased prominence of bilateral lower  lobe atelectasis. There may be a component of left-sided pleural fluid. The heart remains moderately enlarged with evidence of prior placement of a left atrial appendage clip. No overt pulmonary edema is identified.  IMPRESSION: Lower volumes with increased prominence of bilateral lower lobe atelectasis. There may be a component of left-sided pleural fluid.   Electronically Signed   By: Irish LackGlenn  Yamagata M.D.   On: 04/10/2013 11:29   Dg Chest Port 1 View  04/03/2013   CLINICAL DATA:  Central venous catheter placement  EXAM: PORTABLE CHEST - 1 VIEW  COMPARISON:  DG CHEST 1V PORT dated 04/01/2013  FINDINGS: Left upper extremity PICC is been placed. Tip is in the lower SVC. Tracheostomy tube and left atrial appendage clip stable. Left pleural effusion and basilar consolidation stable. Vascular congestion increased.  IMPRESSION: Left PICC placed with its tip at the lower SVC.  Increased vascular congestion.   Stable left basilar consolidation and left pleural effusion.   Electronically Signed   By: Maryclare BeanArt  Hoss M.D.   On: 04/03/2013 09:04   Dg Abd Portable 1v  04/07/2013   CLINICAL DATA:  Abdominal pain.  EXAM: PORTABLE ABDOMEN - 1 VIEW  COMPARISON:  None.  FINDINGS: The bowel gas pattern is normal. IVC filter seen at the level of L3. Surgical clips from prior cholecystectomy noted. No radio-opaque calculi or other significant radiographic abnormality are seen.  IMPRESSION: No acute findings.  Unremarkable bowel gas pattern.   Electronically Signed   By: Myles RosenthalJohn  Stahl M.D.   On: 04/07/2013 15:26   Dg Abd Portable 1v  04/04/2013   CLINICAL DATA:  Evaluate barium  EXAM: PORTABLE ABDOMEN - 1 VIEW  COMPARISON:  03/30/2013  FINDINGS: Contrast material is noted within the transverse colon and splenic flexure of the colon. Small bowel loop distention has improved. Slight prominence of left abdominal small bowel loops persists. No free air. No organomegaly. Prior cholecystectomy.  IMPRESSION: Improving small bowel distention. Small amount of oral contrast material within the transverse colon and splenic flexure of the colon.   Electronically Signed   By: Charlett NoseKevin  Dover M.D.   On: 04/04/2013 10:18    Telemetry: AFib w/RVR, V rate 110-130 bpm   Assessment and Plan  1. Recurrent atrial fibrillation Continue amiodarone loading and diltiazem  Continue Xarelto If doesn't convert in next 24-48 hours consider repeat DCCV  Signed, EDMISTEN, BROOKE PA-C  Agree with above

## 2013-04-12 ENCOUNTER — Encounter (HOSPITAL_COMMUNITY): Payer: Self-pay | Admitting: Cardiology

## 2013-04-12 LAB — COMPREHENSIVE METABOLIC PANEL
ALT: 27 U/L (ref 0–53)
AST: 20 U/L (ref 0–37)
Albumin: 2.1 g/dL — ABNORMAL LOW (ref 3.5–5.2)
Alkaline Phosphatase: 64 U/L (ref 39–117)
BILIRUBIN TOTAL: 0.4 mg/dL (ref 0.3–1.2)
BUN: 24 mg/dL — ABNORMAL HIGH (ref 6–23)
CO2: 29 meq/L (ref 19–32)
CREATININE: 0.59 mg/dL (ref 0.50–1.35)
Calcium: 8.8 mg/dL (ref 8.4–10.5)
Chloride: 111 mEq/L (ref 96–112)
GFR calc Af Amer: >90 mL/min (ref >90)
GLUCOSE: 183 mg/dL — AB (ref 70–99)
Potassium: 3.3 mEq/L — ABNORMAL LOW (ref 3.7–5.3)
Sodium: 149 mEq/L — ABNORMAL HIGH (ref 137–147)
Total Protein: 4.9 g/dL — ABNORMAL LOW (ref 6.0–8.3)

## 2013-04-12 LAB — GLUCOSE, CAPILLARY
GLUCOSE-CAPILLARY: 197 mg/dL — AB (ref 70–99)
Glucose-Capillary: 175 mg/dL — ABNORMAL HIGH (ref 70–99)
Glucose-Capillary: 171 mg/dL — ABNORMAL HIGH (ref 70–99)
Glucose-Capillary: 163 mg/dL — ABNORMAL HIGH (ref 70–99)

## 2013-04-12 LAB — CBC
HCT: 28.4 % — ABNORMAL LOW (ref 39.0–52.0)
HEMOGLOBIN: 8.5 g/dL — AB (ref 13.0–17.0)
MCH: 29.7 pg (ref 26.0–34.0)
MCHC: 29.9 g/dL — ABNORMAL LOW (ref 30.0–36.0)
MCV: 99.3 fL (ref 78.0–100.0)
PLATELETS: 100 10*3/uL — AB (ref 150–400)
RBC: 2.86 MIL/uL — AB (ref 4.22–5.81)
RDW: 21.1 % — ABNORMAL HIGH (ref 11.5–15.5)
WBC: 8.9 10*3/uL (ref 4.0–10.5)

## 2013-04-12 MED ORDER — INSULIN REGULAR HUMAN 100 UNIT/ML IJ SOLN
INTRAVENOUS | Status: AC
  Administered 2013-04-12: 17:00:00 via INTRAVENOUS
  Filled 2013-04-12: qty 2000

## 2013-04-12 MED ORDER — POTASSIUM CHLORIDE 10 MEQ/50ML IV SOLN
10.0000 meq | INTRAVENOUS | Status: AC
  Administered 2013-04-12 (×4): 10 meq via INTRAVENOUS
  Filled 2013-04-12 (×4): qty 50

## 2013-04-12 MED ORDER — FAT EMULSION 20 % IV EMUL
250.0000 mL | INTRAVENOUS | Status: AC
  Administered 2013-04-12: 250 mL via INTRAVENOUS
  Filled 2013-04-12: qty 250

## 2013-04-12 NOTE — Progress Notes (Signed)
Speech Language Pathology Treatment: Dysphagia;Passy Muir Speaking valve  Patient Details Name: Tim BarerJames B Capron MRN: [id number] DOB: 06/04/1934 Today's Date: 04/12/2013 Time: [id number] SLP Time Calculation (min): 28 min  Assessment / Plan / Recommendation Clinical Impression  Therapy session to reinforce chin tuck. Pt position during session lead to natural chin tuck position, but pt making extra effort with min verbal cues. Very difficult to sit pt up fully even with two people assisting. PO intake still seems very poor. Despite diet upgrade and now opportunity to drink nectar thick liquids, pt seems minimally motivated to eat and drink. Pt initially tolerated PMSV very well, but by end of session pt was fatigued with O2 sats dropping to 85. PMSV removed with puff of air and O2 sats climbed, possibly again due to poor body mechanics and shortness of breath. Pt only wears PMSV when necessary for communication with visitors and staff and during PO intake, which is appropriate. Will continue to follow.    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 [facility], unable to wean of the vent and requiring trach placement 10/29/2012. He was transferred to [facility] in December 2014 and later to [facility] in late January 2015. He was on TNA and was doing fairly well initially, but developed more abdominal pain and on 2/23 taken [place] in [place] gallbladder removal (secondary to cholecystitis), resection for part of the colon for fistula, IVC filter placement. Sent back to [facility] on march 4th, 2015 and started on TPN. Diet advanced on March 9th, 2015 after pt passed swallow evaluation. [facility], after  noticing progressive abdominal distension. In ED, CT abdomen with air- fluid level and findings consistent with early partial SBO, ileus. Surgery reports pt is ok for PO from their standpoint, SLP ordered for swallow eval.    Pertinent Vitals NA  SLP Plan  Continue with current plan of care    Recommendations Diet recommendations: Dysphagia 3 (mechanical soft);Nectar-thick liquid Liquids provided via: Cup Medication Administration: Crushed with puree Supervision: Patient able to self feed;Full supervision/cueing for compensatory strategies      Patient may use Passy-Muir Speech Valve: Intermittently with supervision;During all therapies with supervision;Caregiver trained to provide supervision PMSV Supervision: Intermittent       Oral Care Recommendations: Oral care BID Follow up Recommendations: Skilled Nursing facility Plan: Continue with current plan of care    GO    [facility]dwell Medical CenterBonnie Juwuan Sedita, MA CCC-SLP [phone]  Claudine MoutonDeBlois, Yvonnie Schinke Caroline 04/12/2013, 2:33 PM

## 2013-04-12 NOTE — Progress Notes (Signed)
SUBJECTIVE: Aware of palpitations but denies chest pain, some dyspnea. DCCV scheduled today.  Anticoagulated with Xarelto.  Amiodarone started this admission.   CURRENT MEDICATIONS: . amiodarone  400 mg Oral BID  . atorvastatin  40 mg Oral Daily  . chlorhexidine  15 mL Mouth/Throat BID  . diltiazem  240 mg Oral Daily  . feeding supplement (ENSURE)  1 Container Oral TID BM  . hydrocortisone cream   Topical BID  . insulin aspart  0-9 Units Subcutaneous TID WC  . pantoprazole  40 mg Oral Daily  . potassium chloride  40 mEq Oral Once  . predniSONE  5 mg Oral Q breakfast  . rivaroxaban  20 mg Oral Q supper  . roflumilast  500 mcg Oral Daily  . sodium chloride  10-40 mL Intracatheter Q12H  . sodium chloride  3 mL Intravenous Q12H  . sodium chloride  3 mL Intravenous Q12H  . sodium chloride  3 mL Intravenous Q12H   . sodium chloride    . sodium chloride    . sodium chloride    . Marland KitchenTPN (CLINIMIX-E) Adult 83 mL/hr at 04/11/13 1750   And  . fat emulsion 250 mL (04/11/13 1750)    OBJECTIVE: Physical Exam: Filed Vitals:   04/11/13 2059 04/11/13 2100 04/11/13 2340 04/12/13 0341  BP:  141/91    Pulse: 138 122 116 112  Temp:  98.1 F (36.7 C)    TempSrc:  Oral    Resp: 24 21 22 20   Height:      Weight:      SpO2: 99% 93% 98% 97%    Intake/Output Summary (Last 24 hours) at 04/12/13 1610 Last data filed at 04/11/13 1500  Gross per 24 hour  Intake 184.17 ml  Output      0 ml  Net 184.17 ml    Telemetry reveals atrial flutter/fibrillation - ventricular rates 110-130  GEN- The patient is chronically ill appearing, alert and oriented x 3 today.   Head- normocephalic, atraumatic Neck- supple, no JVP, trach tube in place Lungs- scattered diffuse, mostly upper airway rales and wheezes, with increased work of breathing Heart- IRegular rate and rhythm, no murmurs, rubs or gallops, PMI not laterally displaced GI- soft, NT, ND, + BS Extremities- no clubbing, cyanosis, or edema,  atrophied Skin- no rash or lesion Neuro- strength and sensation are intact  LABS: Basic Metabolic Panel:  Recent Labs  96/04/54 0530 04/11/13 0456 04/11/13 1745  NA 151* 152* 151*  K 3.4* 3.6* 3.6*  CL 114* 114* 113*  CO2 26 26 27   GLUCOSE 206* 221* 220*  BUN 25* 25* 25*  CREATININE 0.69 0.57 0.60  CALCIUM 8.7 8.8 8.8  MG 2.2 2.2  --   PHOS 2.8 3.1  --    Liver Function Tests:  Recent Labs  04/10/13 0530 04/11/13 0456  AST 27 19  ALT 38 33  ALKPHOS 63 68  BILITOT 0.3 0.4  PROT 4.9* 5.1*  ALBUMIN 2.3* 2.3*   CBC:  Recent Labs  04/10/13 0530 04/11/13 0456  WBC 9.9 10.3  NEUTROABS  --  8.6*  HGB 8.4* 8.7*  HCT 27.7* 28.5*  MCV 98.9 98.6  PLT 114* 114*   Fasting Lipid Panel:  Recent Labs  04/11/13 0456  TRIG 86    ASSESSMENT AND PLAN:  Principal Problem:   Abdominal pain Active Problems:   Atrial fibrillation   Essential hypertension   Tracheostomy status   Ileus   Hypernatremia Rec: His respiratory status is  such that DCCV which was scheduled today will not maintain him in NSR. He will need additional amiodarone loading. His rate is not particularly well controlled but acceptable. Do we think he is absorbing his po meds.   Leonia ReevesGregg Taylor,M.D.

## 2013-04-12 NOTE — Progress Notes (Signed)
Occupational Therapy Treatment Patient Details Name: Tim Short MRN: [id number] DOB: 11/16/1934 Today's Date: 04/12/2013    History of present illness 79 yo male with recent complications at [facility] after an ablation for afib in oct 2014 he developed a volvulus after the ablation was intubated/trachedm required multiple abd surgeries then developed fistula details unknown.  Has been on tpn, with ngt on/off since. Is at [facility] sent here for complaints of abd pain.  Pt was taken off tpn and feeding tube was removed last week.  He has been eating by mouth and doing well.  But last 4 days with worsening abd pain, no n/v/d.  No fevers (however is was started on vanc and zosyn for unknown reasons).  Pt na level is elevated, and appears dehydrated. 04/08/13 Pt now reports that he is having vertigo which has ebbed and flowed for the  past 14 years.   OT comments  This 79 yo male is really not making in progress because every time he gets up he gets dizzy and wants to lay back down. Will try one more time, if progress still not made will sign off.  Follow Up Recommendations  LTACH    Equipment Recommendations   (TBD next venue)       Precautions / Restrictions Precautions Precautions: Fall Precaution Comments: vertigo--upon sitting up at EOB, VERY HOH Restrictions Weight Bearing Restrictions: No Other Position/Activity Restrictions: Got dizzy sitting up EOB with some room spinning       Mobility Bed Mobility Overal bed mobility: Needs Assistance Bed Mobility: Rolling;Supine to Sit Rolling: Supervision   Supine to sit: Min assist     General bed mobility comments: Pt agreeable to sit up on EOB, had not set up even one minute when pt reported dizziness and that he need to lay down, pt did end up sitting up about 2 minutes while we were trying to get his lines and linens rearranged be laying him back down (the whole time saying that he was dizzy and need to lay back down).       Balance  Overall balance assessment: Needs assistance Sitting-balance support: Feet supported;Single extremity supported Sitting balance-[name] Scale: Poor                                             Cognition   Behavior During Therapy: WFL for tasks assessed/performed Overall Cognitive Status: Within Functional Limits for tasks assessed                                          Frequency Min 2X/week     Progress Toward Goals  OT Goals(current goals can now be found in the care plan section)  Progress towards OT goals: Not progressing toward goals - comment (due to dizziness)     Plan Discharge plan remains appropriate    End of Session    Activity Tolerance  (limited by dizziness when he sat up)   Patient Left in bed;with bed alarm set   Nurse Communication  (Condom cath came off)        Time: 1204-1218 OT Time Calculation (min): 14 min  Charges: OT General Charges $OT Visit: 1 Procedure OT Treatments $Self Care/Home Management : 8-22 mins  Tim Short, Tim Short [phone] 04/12/2013, 4:04 PM

## 2013-04-12 NOTE — Progress Notes (Signed)
PARENTERAL NUTRITION CONSULT NOTE - FOLLOW UP  Pharmacy Consult for TPN Indication: inadequate PO intake, resolving SBO  Allergies  Allergen Reactions  . Corticosteroids     Inhaled Corticosteroids--Hoarseness, dry mouth  . Furosemide Other (See Comments)    Ototoxicity     Patient Measurements: Height: 5' 8.9" (175 cm) Weight: 186 lb (84.369 kg) IBW/kg (Calculated) : 70.47   Vital Signs: Temp: 98.3 F (36.8 C) (03/31 0630) Temp src: Oral (03/31 0630) BP: 95/67 mmHg (03/31 0630) Pulse Rate: 116 (03/31 0630) Intake/Output from previous day: 03/30 0701 - 03/31 0700 In: 184.2 [I.V.:184.2] Out: -  Intake/Output from this shift:    Labs:  Recent Labs  04/10/13 0530 04/11/13 0456 04/11/13 1745 04/12/13 0525  WBC 9.9 10.3  --  8.9  HGB 8.4* 8.7*  --  8.5*  HCT 27.7* 28.5*  --  28.4*  PLT 114* 114*  --  100*  INR  --   --  1.14  --      Recent Labs  04/10/13 0530 04/11/13 0456 04/11/13 1745 04/12/13 0525  NA 151* 152* 151* 149*  K 3.4* 3.6* 3.6* 3.3*  CL 114* 114* 113* 111  CO2 26 26 27 29   GLUCOSE 206* 221* 220* 183*  BUN 25* 25* 25* 24*  CREATININE 0.69 0.57 0.60 0.59  CALCIUM 8.7 8.8 8.8 8.8  MG 2.2 2.2  --   --   PHOS 2.8 3.1  --   --   PROT 4.9* 5.1*  --  4.9*  ALBUMIN 2.3* 2.3*  --  2.1*  AST 27 19  --  20  ALT 38 33  --  27  ALKPHOS 63 68  --  64  BILITOT 0.3 0.4  --  0.4  PREALBUMIN  --  11.4*  --   --   TRIG  --  86  --   --    Estimated Creatinine Clearance: 77.1 ml/min (by C-G formula based on Cr of 0.59).    Recent Labs  04/11/13 1630 04/11/13 2120 04/12/13 0624  GLUCAP 188* 209* 175*    Insulin Requirements in the past 24 hours:  6 units of Novolog  Current Nutrition:  Dysphagia 2 diet with poor PO intake documented (0-25%) Clinimix E 5/15 at 83 ml/hr + 20% lipid emulsion at 10 ml/hr.  This provides 1894 kCal and 100 g protein per day.  Assessment: Admit: Transferred from [facility] with abdominal pain, partial SBO,  ileus.  GI: resolved ileus per surgery.  Fall 2014 - volvulus, multiple abd surgeries, fistula development, was on TPN long-term but recently stopped and was tolerating PO intake per report. Now with poor PO intake. Now advanced to Dysphagia 2 diet w/ no N/V and good bowel movement. Pt did not eat breakfast this AM. Per MD will use TPN for support since not planning to place PEG due to anticoagulation (Xarelto). Noted pt with diarrhea 3/28 but improved 3/29. Cdiff negative. Please see RD note 3/27: recs for short-term enteral support via NGT is appropriate as he has a functioning GI tract. Prealbumin 11.4 (low) on 3/30.   Endo: CBGs >180, requiring some SSI. On PO prednisone  Lytes: Na 149 (probably d/t fluid status); K 3.3, other lytes wnl. I/Os not being charted accurately.  Renal: SCr stable  Pulm: Trach collar, daliresp  Cards: Afib - VSS (NSR s/p cardioversion 3/27); diltiazem, on Xarelto. Per EP, will need additional amiodarone loading prior to DCCV   Hepatobil: LFTs wnl, TG wnl, Albumin 2.1  Neuro:  A&O  ID: completed a 7d course of ABX for HCAP 3/26  Best Practices: Xarelto for atrial fibrillation  TPN Access: PICC placed 3/22  TPN day#: 6  Nutritional Goals:  2050-2250 kCal, 110-120 grams of protein per day  Plan:  1) Decrease Clinimix E 5/15 to 60 ml/hr + 20% lipid emulsion at 10 ml/hr in an attempt to increase appetite. This meets ~70% minimum protein and kcal needs.    2) F/u po intake and ability to being to wean TPN. 3) Continue CBGs and SSI TID with meals. Will add small amount of insulin to TPN. 4) F/u TPN labs. If Na remains high, may need to try adding D5 in case pt is dry. 5) Give KCl 40 mEq IV  5) Consider replacing NGT and feeding enterally as his gut works. TPN not appropriate nutrition support in this pt with functioning GI tract.  Vinnie LevelBenjamin Annalisa Colonna, PharmD.  Clinical Pharmacist Pager [phone]

## 2013-04-12 NOTE — Progress Notes (Addendum)
Patient ID: Tim BarerJames B Toste, male   DOB: 12/22/1934, 79 y.o.   MRN: [id number]  [facility] PROGRESS NOTE  Tim Short [id number] DOB: 07/22/1934 DOA: 03/28/2013 PCP:  Duane Lopeoss, Alan, MD  HPI/Interval progression Pt is 79 yo male with fairly recent [facility] for failed maize procedure and during that hospitalization pt developed volvulus and has required right colectomy (10/08/2012), subsequently developed multiple abscesses and enteric fistulas managed percutaneous drains. [facility], unable to wean of the vent and requiring trach placement 10/29/2012. He was transferred to [facility] in December 2014 and later to [facility] in late January 2015. He was on TNA and was doing fairly well initially, but developed more abdominal pain and on 2/23 taken to [place] in [place] for gallbladder removal (secondary to cholecystitis), resection for part of the colon for fistula, IVC filter placement. Sent back to [facility] on march 4th, 2015 and started on TPN. Diet advanced on March 9th, 2015 after pt passed swallow evaluation. [facility], after noticing progressive abdominal distension. In ED, CT abdomen with air- fluid level and findings consistent with early partial SBO, ileus.  [facility] with surgical consult, his SBO slowly resolved and surgery signed off 3/27. Throughout his hospitalization patient developed Atrial fibrillation with RVR to rated of 130s, stable, and cardiology has been consulted. Patient had several up titration of his medications including addition of Amiodarone, and was d/c cardioverted on 3/27. He maintained sinus rhythm through 3/28-3/29 with plans for discharge on 3/31, however on 3/30 went back into A fib with RVR and cardiology has been re consulted.   Atrial fibrillation/flutter - rate in 120's - 130's and very difficult to control while on medications, cardiology following,  now s/p cardioversion 3/27, restored sinus rhythm and maintained over the weekend however 3/30 am it looks like he is back in A fib with RVR, rates in the 130s. Cardiology called, appreciate input.  - ?plan for another cardioversion after more loading with Amiodarone.  - Heart rate today 120s to 130s Abdominal pain - secondary to ileus, partial SBO as observed on admission, now resolved, patient with good bowel movement. He has a degree of abdominal pain which is stable. No nausea/vomiting and eating as tolerated. Had few diarrheal episodes on 3/29, C diff was sent and was negative.  - continue supportive care with analgesia, antiemetics as needed for symptom control. - nutrition consulted, appreciate input, patient is not meeting his nutritional needs based on oral intake.  - patient with intermittent confusion last week, which is now better, and on anticoagulation for his A fib, will hold off PEG tube placement. TPN initiated 3/26 and that can be weaned off pending further evaluations by nutrition.  - continue oral intake as tolerated, had another evaluation 3/30, his diet was changed from Dys1 to Dys 2, patient eating better now.  Moderate malnutrition  - secondary to acute on chronic illness as outlined above  - pt tolerating well however does not seem to meet his nutritional requirements per nutrition - on TPN, appreciate nutrition and pharmacy consults.  Leukocytosis  - likely secondary to ileus, HCAP as suggestive per CXR 3/20  - CT abd/pelvis with mild leak around anastomosis but no other acute events, and surgery signed off - leukocytosis has now resolved HCAP - s/p complete 7 day treatment initially with Vancomycin and Maxipime for 5 days then transitioned to for 2 more days,  finished antibiotics on 3/26, afebrile, no furthehr events.  Pulmonary vascular congestion - lung exam improving and stable.  - IVF stopped 3/20 due to pulmonary vascular congestion  - monitor I's/O's, daily  weights  - weight trend: 203 lbs --> 195 --> 192 >> 194 >> 191 >>186 - repeat CXR 3/29 stable Hypokalemia - continue supplementation. Magnesium normal.  Sacral ulcer - skin breakdown noted, closely monitor. Nursing care. No evidence of infection.  Anemia of chronic disease - no signs of active bleeding. Mild thrombocytopenia, stable. Likely multifactorial ?medication induced, nutritional deficiencies. Closely monitor.  Essential hypertension - within normal limits today Tracheostomy status - suctioning as needed.  - Patient with a desaturation event 3/30 evening requiring suctioning.  Hypernatremia - TPN per pharmacy. Improving, supplemented 3/30 with D5W.   Consultants:  Surgery  Cardiology   Procedures/Studies:  Ct Abdomen Pelvis W Contrast 03/28/2013 Findings consistent with persistent phlegmon in the liver. Interim removal of right upper quadrant drainage catheter. Colonic distention with air-fluid levels. Slight progression of soft tissue fluid in the anterior abdomen in the region the patient's scarring. 13 mm low-density lesion left kidney, not definite simple cyst.  Dg Abd 2 Views 03/30/2013 Mildly dilated loops of left upper quadrant small bowel with scattered internal air-fluid levels, similar to the prior exam, with abnormal but nonspecific pattern which could be due to could ileus or partial SBO.  Dg Abd 2 View 03/29/2013 Nonspecific bowel gas pattern, as above, suggestive of partial small bowel obstruction. No pneumoperitoneum.  Dg Abd Acute W/chest 03/28/2013 Persistent small bowel dilatation compatible with either partial SBO or ileus. No change in aeration to the lungs compared with previous exam.  Antibiotics:  Vancomycin 3/20 --> 3/24 Maxipime 3/20 --> 3/24 Levaquin 3/24 --> 3/26  Code Status: Full  Family Communication: d/w patient this morning son Onalee Hua  Disposition Plan: Remains inpatient, LTACH vs SNF  HPI/Subjective: - patient without complaints this morning, tells  me he ate better.   Objective: Filed Vitals:   04/11/13 2100 04/11/13 2340 04/12/13 0341 04/12/13 0630  BP: 141/91   95/67  Pulse: 122 116 112 116  Temp: 98.1 F (36.7 C)   98.3 F (36.8 C)  TempSrc: Oral   Oral  Resp: 21 22 20 22   Height:      Weight:      SpO2: 93% 98% 97% 95%    Intake/Output Summary (Last 24 hours) at 04/12/13 0647 Last data filed at 04/11/13 1500  Gross per 24 hour  Intake 184.17 ml  Output      0 ml  Net 184.17 ml   Exam:  General:  Pt is alert, follows commands appropriately, not in acute distress, trach in place   Cardiovascular: iregular, no murmurs, no rubs, tachycardic  Respiratory: no wheezing, mild bibasilar rhonchi  Abdomen: Soft, non tender, non distended, bowel sounds present, no guarding  Extremities: No edema, pulses DP and PT palpable bilaterally  Data Reviewed: Basic Metabolic Panel:  Recent Labs Lab 04/06/13 1430  04/08/13 0500 04/09/13 0416 04/10/13 0530 04/11/13 0456 04/11/13 1745  NA  --   < > 155* 155* 151* 152* 151*  K 3.3*  < > 3.0* 3.3* 3.4* 3.6* 3.6*  CL  --   < > 118* 116* 114* 114* 113*  CO2  --   < > 28 27 26 26 27   GLUCOSE  --   < > 161* 188* 206* 221* 220*  BUN  --   < > 13 19 25* 25* 25*  CREATININE  --   < > 0.76 0.73 0.69 0.57 0.60  CALCIUM  --   < > 8.6 8.9 8.7 8.8 8.8  MG 2.2  --  2.1 2.2 2.2 2.2  --   PHOS  --   --  2.1* 2.9 2.8 3.1  --   < > = values in this interval not displayed. CBC:  Recent Labs Lab 04/08/13 0500 04/09/13 0416 04/10/13 0530 04/11/13 0456 04/12/13 0525  WBC 8.8 11.2* 9.9 10.3 8.9  NEUTROABS 6.9  --   --  8.6*  --   HGB 8.6* 8.5* 8.4* 8.7* 8.5*  HCT 27.9* 28.1* 27.7* 28.5* 28.4*  MCV 98.9 98.6 98.9 98.6 99.3  PLT 130* 125* 114* 114* 100*   CBG:  Recent Labs Lab 04/10/13 2109 04/11/13 0619 04/11/13 1110 04/11/13 1630 04/11/13 2120  GLUCAP 203* 195* 167* 188* 209*   Recent Results (from the past 240 hour(s))  CULTURE, RESPIRATORY (NON-EXPECTORATED)      Status: None   Collection Time    04/02/13  8:34 AM      Result Value Ref Range Status   Specimen Description TRACHEAL ASPIRATE   Final   Special Requests NONE   Final   Gram Stain     Final   Value: ABUNDANT WBC PRESENT, PREDOMINANTLY PMN     MODERATE SQUAMOUS EPITHELIAL CELLS PRESENT     FEW GRAM POSITIVE COCCI IN PAIRS     IN CLUSTERS IN CHAINS FEW GRAM NEGATIVE COCCI     Performed at [facility]   Culture     Final   Value: Non-Pathogenic Oropharyngeal-type Flora Isolated.     Performed at [facility]   Report Status 04/04/2013 FINAL   Final  CLOSTRIDIUM DIFFICILE BY PCR     Status: None   Collection Time    04/10/13  9:03 AM      Result Value Ref Range Status   C difficile by pcr NEGATIVE  NEGATIVE Final    Scheduled Meds: . amiodarone  400 mg Oral BID  . atorvastatin  40 mg Oral Daily  . chlorhexidine  15 mL Mouth/Throat BID  . diltiazem  240 mg Oral Daily  . feeding supplement (ENSURE)  1 Container Oral TID BM  . hydrocortisone cream   Topical BID  . insulin aspart  0-9 Units Subcutaneous TID WC  . pantoprazole  40 mg Oral Daily  . potassium chloride  40 mEq Oral Once  . predniSONE  5 mg Oral Q breakfast  . rivaroxaban  20 mg Oral Q supper  . roflumilast  500 mcg Oral Daily  . sodium chloride  10-40 mL Intracatheter Q12H  . sodium chloride  3 mL Intravenous Q12H  . sodium chloride  3 mL Intravenous Q12H  . sodium chloride  3 mL Intravenous Q12H   Pamella Pert, MD Pager [phone] Time spent: 35 minutes If 7PM-7AM, please contact night-coverage [email or web address] Password [facility] 04/12/2013, 6:47 AM   LOS: 15 days

## 2013-04-12 NOTE — Progress Notes (Signed)
Son called to update on cancellation of cardioversion this AM; message left on voicemail; will cont. Monitor.

## 2013-04-12 NOTE — Progress Notes (Signed)
Called to pts. room STAT for low sats, (50's)RN's suctioned out thick mucus plugs prior to my arrival after hearing continuous pulse oximeter alarming and pts. color noted to be greyish-blue in nature upon their arrival. Upon my arrival to room pts. color pink [id number], pulse oximeter 96%, b/l b.s. noted, mild rhonchi present, suctioned again with small thick/grey/dark brown, "plug-like" mucus removed, sats @ 93%, >'d Fi02 to 35%/8lpm flow for >'d humidity, ordered extra suction catheters, via (A-11) for clean supply-supply depleted, (X3 in room), RT to monitor, plan to pass on in report, note has been left for Physicians about possible Trach change since current Trach-Portex 6.0 cuffed(cuff<), has been in place since 3/16 on admission from [facility] to ED.

## 2013-04-12 NOTE — Progress Notes (Signed)
Physical Therapy Treatment Patient Details Name: Tim Short MRN: [id number] DOB: 01-Feb-1934 Today's Date: 04/12/2013    History of Present Illness 79 yo male with recent complications at [facility] after an ablation for afib in oct 2014 he developed a volvulus after the ablation was intubated/trachedm required multiple abd surgeries then developed fistula details unknown.  Has been on tpn, with ngt on/off since. Is at [facility] sent here for complaints of abd pain.  Pt was taken off tpn and feeding tube was removed last week.  He has been eating by mouth and doing well.  But last 4 days with worsening abd pain, no n/Short/d.  No fevers (however is was started on vanc and zosyn for unknown reasons).  Pt na level is elevated, and appears dehydrated. 04/08/13 Pt now reports that he is having vertigo which has ebbed and flowed for the  past 14 years.    PT Comments    Pt continue to few gains due to dizziness and weakness.  Today emphasized treating for L BPPV.  Like the R side, no nystagmus noted, but pt felt room spinning, so proceeded to Epply.  Too dizzy upon sitting to try standing today.   Follow Up Recommendations  SNF     Equipment Recommendations       Recommendations for Other Services       Precautions / Restrictions Precautions Precautions: Fall Precaution Comments: vertigo--upon sitting up at EOB, VERY HOH Restrictions Weight Bearing Restrictions: No Other Position/Activity Restrictions: Got dizzy sitting up EOB with some room spinning    Mobility  Bed Mobility Overal bed mobility: Needs Assistance Bed Mobility: Rolling;Supine to Sit Rolling: Supervision Sidelying to sit: Mod assist (after epply maneuver to fix vertigo) Supine to sit: Min assist Sit to supine: Mod assist   General bed mobility comments: Needs truncal assist for transition to EOB  Pt reported dizziness on reaching  EOB  Transfers Overall transfer level: Needs assistance                   Ambulation/Gait                 Stairs            Wheelchair Mobility    Modified Rankin (Stroke Patients Only)       Balance Overall balance assessment: Needs assistance Sitting-balance support: Feet supported;Single extremity supported Sitting balance-[name] Scale: Poor Sitting balance - Comments: tendency to fall backward due to dynamic mattress needing min assist                            Cognition Arousal/Alertness: Awake/alert Behavior During Therapy: WFL for tasks assessed/performed Overall Cognitive Status: Within Functional Limits for tasks assessed                      Exercises      General Comments        Pertinent Vitals/Pain     Home Living                      Prior Function            PT Goals (current goals can now be found in the care plan section) Acute Rehab PT Goals PT Goal Formulation: With patient Time For Goal Achievement: 04/19/13 Potential to Achieve Goals: Good Progress towards PT goals: Not progressing toward goals - comment    Frequency  Min 3X/week  PT Plan Current plan remains appropriate    End of Session Equipment Utilized During Treatment: Oxygen Activity Tolerance: Patient tolerated treatment well;Other (comment) Patient left: in bed;with call bell/phone within reach     Time: 1520-1546 PT Time Calculation (min): 26 min  Charges:  $Therapeutic Activity: 8-22 mins $Neuromuscular Re-education: 8-22 mins                    G Codes:      Tim Short, Tim Short 04/12/2013, 5:22 PM 04/12/2013  Tim Short, PT [phone]  (pager)

## 2013-04-13 DIAGNOSIS — J9610 Chronic respiratory failure, unspecified whether with hypoxia or hypercapnia: Secondary | ICD-10-CM

## 2013-04-13 LAB — URINALYSIS, ROUTINE W REFLEX MICROSCOPIC
Bilirubin Urine: NEGATIVE
Glucose, UA: 100 mg/dL — AB
HGB URINE DIPSTICK: NEGATIVE
KETONES UR: NEGATIVE mg/dL
Leukocytes, UA: NEGATIVE
Nitrite: NEGATIVE
PROTEIN: NEGATIVE mg/dL
Specific Gravity, Urine: 1.018 (ref 1.005–1.030)
UROBILINOGEN UA: 0.2 mg/dL (ref 0.0–1.0)
pH: 5.5 (ref 5.0–8.0)

## 2013-04-13 LAB — BASIC METABOLIC PANEL
BUN: 21 mg/dL (ref 6–23)
CO2: 27 mEq/L (ref 19–32)
CREATININE: 0.59 mg/dL (ref 0.50–1.35)
Calcium: 8.5 mg/dL (ref 8.4–10.5)
Chloride: 110 mEq/L (ref 96–112)
GFR calc Af Amer: >90 mL/min (ref >90)
GLUCOSE: 181 mg/dL — AB (ref 70–99)
POTASSIUM: 3.3 meq/L — AB (ref 3.7–5.3)
Sodium: 148 mEq/L — ABNORMAL HIGH (ref 137–147)

## 2013-04-13 LAB — GLUCOSE, CAPILLARY
GLUCOSE-CAPILLARY: 177 mg/dL — AB (ref 70–99)
Glucose-Capillary: 159 mg/dL — ABNORMAL HIGH (ref 70–99)
Glucose-Capillary: 167 mg/dL — ABNORMAL HIGH (ref 70–99)
Glucose-Capillary: 156 mg/dL — ABNORMAL HIGH (ref 70–99)

## 2013-04-13 LAB — CBC
HCT: 27.6 % — ABNORMAL LOW (ref 39.0–52.0)
HEMOGLOBIN: 8.5 g/dL — AB (ref 13.0–17.0)
MCH: 29.8 pg (ref 26.0–34.0)
MCHC: 30.8 g/dL (ref 30.0–36.0)
MCV: 96.8 fL (ref 78.0–100.0)
Platelets: 105 10*3/uL — ABNORMAL LOW (ref 150–400)
RBC: 2.85 MIL/uL — ABNORMAL LOW (ref 4.22–5.81)
RDW: 20.7 % — AB (ref 11.5–15.5)
WBC: 7 10*3/uL (ref 4.0–10.5)

## 2013-04-13 MED ORDER — FAT EMULSION 20 % IV EMUL
250.0000 mL | INTRAVENOUS | Status: AC
  Administered 2013-04-13: 250 mL via INTRAVENOUS
  Filled 2013-04-13: qty 250

## 2013-04-13 MED ORDER — POTASSIUM CHLORIDE 10 MEQ/50ML IV SOLN
10.0000 meq | INTRAVENOUS | Status: AC
  Administered 2013-04-13 (×4): 10 meq via INTRAVENOUS
  Filled 2013-04-13 (×4): qty 50

## 2013-04-13 MED ORDER — TRACE MINERALS CR-CU-F-FE-I-MN-MO-SE-ZN IV SOLN
INTRAVENOUS | Status: AC
  Administered 2013-04-13: 17:00:00 via INTRAVENOUS
  Filled 2013-04-13: qty 2000

## 2013-04-13 MED ORDER — KCL IN DEXTROSE-NACL 20-5-0.45 MEQ/L-%-% IV SOLN
INTRAVENOUS | Status: AC
  Administered 2013-04-13 – 2013-04-15 (×3): via INTRAVENOUS
  Filled 2013-04-13 (×4): qty 1000

## 2013-04-13 NOTE — Progress Notes (Signed)
NUTRITION FOLLOW UP  INTERVENTION:  TPN per pharmacy Continue Ensure Pudding po TID, each supplement provides 170 kcal and 4 grams of protein RD to follow for nutrition care plan  NUTRITION DIAGNOSIS: Inadequate oral intake now related to dysphagia, poor appetite as evidenced by 0-20%, ongoing  Goal: Pt to meet >/= 90% of their estimated nutrition needs, progressing   Monitor:  TPN prescription, PO intake, weight, labs, I/O's  ASSESSMENT: 79 yo male with recent complications at [facility] after an ablation for afib in Oct 2014 he developed a volvulus after the ablation was intubated/trached; required multiple abd surgeries then developed fistula.   Has been on TPN, with NGT on/off since. Is at [facility] and sent to [facility] for complaints of abd pain. Pt was taken off TPN and NGT was removed last week. He has been eating by mouth and doing well. Last 4 days with worsening abd pain, no N/V/D.  Patient upgraded to Dys 2-nectar thick liquid diet.  PO intake remains very poor at 0-25% per flowsheet records.  No plans for PEG tube placement at this time due to anticoagulation.  Continues on trach collar.  Patient is receiving TPN with Clinimix E 5/15 @ 60 ml/hr and lipids @ 10 ml/hr. Provides 1502 kcal and 72 grams protein per day. Meets 73% minimum estimated energy needs and 65% minimum estimated protein needs.  Height: Ht Readings from Last 1 Encounters:  03/30/13 5' 8.9" (1.75 m)    Weight -----> trending down Wt Readings from Last 1 Encounters:  04/13/13 187 lb (84.823 kg)    3/30  186 lb 3/29  191 lb 3/27  194 lb 3/26  192 lb 3/24  195 lb 3/18  203 lb 3/17  203 lb  BMI:  Body mass index is 27.7 kg/(m^2).  Re-estimated needs: Kcal: 2050-2250 Protein: 110-120 gm Fluid: 2.0-2.2 L  Skin: Intact  Diet Order: Dysphagia 2, nectar thick liquids   Intake/Output Summary (Last 24 hours) at 04/13/13 1512 Last data filed at 04/13/13 1200  Gross per 24 hour  Intake      0 ml  Output     801 ml  Net   -801 ml    Labs:   Recent Labs Lab 04/09/13 0416 04/10/13 0530 04/11/13 0456 04/11/13 1745 04/12/13 0525 04/13/13 0605  NA 155* 151* 152* 151* 149* 148*  K 3.3* 3.4* 3.6* 3.6* 3.3* 3.3*  CL 116* 114* 114* 113* 111 110  CO2 27 26 26 27 29 27   BUN 19 25* 25* 25* 24* 21  CREATININE 0.73 0.69 0.57 0.60 0.59 0.59  CALCIUM 8.9 8.7 8.8 8.8 8.8 8.5  MG 2.2 2.2 2.2  --   --   --   PHOS 2.9 2.8 3.1  --   --   --   GLUCOSE 188* 206* 221* 220* 183* 181*    CBG (last 3)   Recent Labs  04/12/13 2149 04/13/13 0644 04/13/13 1106  GLUCAP 163* 159* 177*    Scheduled Meds: . amiodarone  400 mg Oral BID  . atorvastatin  40 mg Oral Daily  . chlorhexidine  15 mL Mouth/Throat BID  . diltiazem  240 mg Oral Daily  . feeding supplement (ENSURE)  1 Container Oral TID BM  . hydrocortisone cream   Topical BID  . insulin aspart  0-9 Units Subcutaneous TID WC  . pantoprazole  40 mg Oral Daily  . predniSONE  5 mg Oral Q breakfast  . rivaroxaban  20 mg Oral Q supper  .  roflumilast  500 mcg Oral Daily  . sodium chloride  10-40 mL Intracatheter Q12H  . sodium chloride  3 mL Intravenous Q12H  . sodium chloride  3 mL Intravenous Q12H  . sodium chloride  3 mL Intravenous Q12H    Continuous Infusions: . sodium chloride    . sodium chloride    . dextrose 5 % and 0.45 % NaCl with KCl 20 mEq/L 50 mL/hr at 04/13/13 0916  . Marland KitchenTPN (CLINIMIX-E) Adult 60 mL/hr at 04/12/13 1706   And  . fat emulsion 250 mL (04/12/13 1706)  . Marland KitchenTPN (CLINIMIX-E) Adult     And  . fat emulsion      Past Medical History  Diagnosis Date  . Hypertension   . Hypercholesteremia   . Asthma   . Meniere disease   . Persistent atrial fibrillation     a. s/p multiple dccv's;  b. failed amio;  c. not felt to be AF RFCA canddiate due to LA dil;  d. s/p failed hybrid ablation @ [facility] in 09/2012;  e. chronic pradaxa.  . Obesity   . Biatrial enlargement     LA size 5.3cm  . Obstructive sleep apnea     AHI 108/hr  now on CPAP at 12cm H2O  . H/O hiatal hernia   . GERD (gastroesophageal reflux disease)     "associated w/hiatal hernia" (06-11-2012)  . Peptic ulcer 1950's  . Migraines     "haven't had one for about 15 years" (06/11/2012)  . Arthritis     "joints" (2012/06/11)  . Chronic lower back pain   . Nephrolithiasis ~ 1960's    "passed on their own" (06/11/12)  . History of pneumonia 2002; 2006    "spent 8 days in isolation; Norovirus" (2012-06-11)  . Other and unspecified angina pectoris   . RLS (restless legs syndrome)   . Coronary artery disease     a. 05/2012 Cath/PCI: LM nl, LAD nl, LCX 8m (3.0x15 Integrity BMS), PTCA of OM1 through stent struts (kissing balloon).  . Diabetes mellitus without complication     FAMILY STATES PATIENT IS NOT DIABETIC    Past Surgical History  Procedure Laterality Date  . Wrist fracture surgery  1992    "repaired w/left hip bone graft" (11-Jun-2012)  . Total knee arthroplasty  08/19/1999  . Colonoscopy w/ polypectomy  2006  . Knee arthroscopy with meniscal repair Right 1974    "medial meniscus repaired" (11-Jun-2012)  . Cardioversion  10/02/2011    Procedure: CARDIOVERSION;  Surgeon: Corky Crafts, MD;  Location: [facility] ENDOSCOPY;  Service: Cardiovascular;  Laterality: N/A;  h/p in file drawer  . Cardioversion  11/07/2011    Procedure: CARDIOVERSION;  Surgeon: Corky Crafts, MD;  Location: [facility] ENDOSCOPY;  Service: Cardiovascular;  Laterality: N/A;  h/p from 10/22 in file drawer/dl  . Cardiac catheterization  05/26/2012  . Coronary angioplasty with stent placement  11-Jun-2012    "1" (06/11/12)  . Cataract extraction w/ intraocular lens  implant, bilateral  2009  . Hemorrhoid surgery  ~ 2003  . Hiatal hernia repair  1983  . Nissen fundoplication  1983  . Ablation of dysrhythmic focus  SEPT/OCT 2014    ATRIAL FIB  . Inguinal hernia repair Bilateral 2000 and 2005    "one done at a time" (2012-06-11)  . Colon surgery  10/08/2012   . Tracheostomy  OCT  2014  . Cardioversion N/A 04/08/2013    Procedure: CARDIOVERSION    (BEDSIDE) ;  Surgeon: Luis Abed,  MD;  Location: [facility] OR;  Service: Cardiovascular;  Laterality: N/A;    Maureen ChattersKatie Burgundy Matuszak, RD, LDN Pager #: [phone] After-Hours Pager #: [phone]

## 2013-04-13 NOTE — Progress Notes (Signed)
PARENTERAL NUTRITION CONSULT NOTE - FOLLOW UP  Pharmacy Consult for TPN Indication: inadequate PO intake, resolving SBO  Allergies  Allergen Reactions  . Corticosteroids     Inhaled Corticosteroids--Hoarseness, dry mouth  . Furosemide Other (See Comments)    Ototoxicity     Patient Measurements: Height: 5' 8.9" (175 cm) Weight: 187 lb (84.823 kg) IBW/kg (Calculated) : 70.47   Vital Signs: Temp: 97.9 F (36.6 C) (04/01 0732) Temp src: Oral (04/01 0732) BP: 127/88 mmHg (04/01 0732) Pulse Rate: 139 (04/01 0732) Intake/Output from previous day: 03/31 0701 - 04/01 0700 In: 0  Out: 1101 [Urine:1100; Stool:1] Intake/Output from this shift:    Labs:  Recent Labs  04/11/13 0456 04/11/13 1745 04/12/13 0525 04/13/13 0605  WBC 10.3  --  8.9 7.0  HGB 8.7*  --  8.5* 8.5*  HCT 28.5*  --  28.4* 27.6*  PLT 114*  --  100* 105*  INR  --  1.14  --   --      Recent Labs  04/11/13 0456 04/11/13 1745 04/12/13 0525 04/13/13 0605  NA 152* 151* 149* 148*  K 3.6* 3.6* 3.3* 3.3*  CL 114* 113* 111 110  CO2 26 27 29 27   GLUCOSE 221* 220* 183* 181*  BUN 25* 25* 24* 21  CREATININE 0.57 0.60 0.59 0.59  CALCIUM 8.8 8.8 8.8 8.5  MG 2.2  --   --   --   PHOS 3.1  --   --   --   PROT 5.1*  --  4.9*  --   ALBUMIN 2.3*  --  2.1*  --   AST 19  --  20  --   ALT 33  --  27  --   ALKPHOS 68  --  64  --   BILITOT 0.4  --  0.4  --   PREALBUMIN 11.4*  --   --   --   TRIG 86  --   --   --    Estimated Creatinine Clearance: 83.3 ml/min (by C-G formula based on Cr of 0.59).    Recent Labs  04/12/13 1607 04/12/13 2149 04/13/13 0644  GLUCAP 197* 163* 159*    Insulin Requirements in the past 24 hours:  6 units of Novolog  Current Nutrition:  Dysphagia 2 diet with poor PO intake documented (0-25%) Clinimix E 5/15 at 83 ml/hr + 20% lipid emulsion at 10 ml/hr.  This provides 1894 kCal and 100 g protein per day.  Assessment: Admit: Transferred from [facility] with abdominal pain,  partial SBO, ileus.  GI: resolved ileus per surgery.  Fall 2014 - volvulus, multiple abd surgeries, fistula development, was on TPN long-term but recently stopped and was tolerating PO intake per report. Now with poor PO intake. Now advanced to Dysphagia 3 diet w/ no N/V and good bowel movement. Pt is not eating yet. Per MD will use TPN for support since not planning to place PEG due to anticoagulation (Xarelto). Noted pt with diarrhea 3/28 but improved 3/29. Cdiff negative. Please see RD note 3/27: recs for short-term enteral support via NGT is appropriate as he has a functioning GI tract. Prealbumin 11.4 (low) on 3/30.   Endo: CBGs <180, requiring some SSI. On PO prednisone  Lytes: Na 148 (trending down); K remains at 3.3 despite supplementation, will order f/u Mg, other lytes wnl. I/Os not being charted accurately. Will order D5-1/2NS with K for hypernatremia and hypokalemia   Renal: SCr stable  Pulm: Trach collar, daliresp  Cards:  Afib - VSS (NSR s/p cardioversion 3/27); diltiazem, on Xarelto. Per EP, will need additional amiodarone loading prior to DCCV   Hepatobil: LFTs wnl, TG wnl, Albumin 2.1  Neuro: A&O  ID: completed a 7d course of ABX for HCAP 3/26  Best Practices: Xarelto for atrial fibrillation  TPN Access: PICC placed 3/22  TPN day#: 7  Nutritional Goals:  2050-2250 kCal, 110-120 grams of protein per day  Plan:  1) Continue Clinimix E 5/15 to 60 ml/hr + 20% lipid emulsion at 10 ml/hr in an attempt to increase appetite. This meets ~70% minimum protein and kcal needs.    2) F/u po intake and ability to being to wean TPN. 3) Continue CBGs and SSI TID with meals. Will continue with small amount of insulin in TPN. 4) Daily MVI and M/W/F Trace elements only due to shortage  5) F/u TPN labs, Phos and Mg.  6) Give bolus KCl 40 mEq IV. Will change MVIF to D5-1/2NS w/ 20 mEq K @ 50 mL/hr  7) Consider replacing NGT and feeding enterally as his gut works. TPN not appropriate  nutrition support in this pt with functioning GI tract.  Vinnie LevelBenjamin Phallon Haydu, PharmD.  Clinical Pharmacist Pager [phone]

## 2013-04-13 NOTE — Progress Notes (Signed)
Patient did not sleep most of the night.Keep calling out for different things. Drops things on the floor and calls staff to pick. Suctioned patient for 6 times. Complained of pain. Medication given as ordered with some relief. Q 2 hours turn . Will continue to monitor.

## 2013-04-13 NOTE — Progress Notes (Addendum)
[facility] PROGRESS NOTE  Tim Short ZOX:[id number] DOB: 01-31-1934 DOA: 03/28/2013 PCP:  Duane Lope, MD  Assessment/Plan: Atrial fibrillation/flutter  - rate in 120's - 130's and very difficult to control while on medications,  - appreciate cardiology following -s/p cardioversion 3/27, restored sinus rhythm and maintained over the weekend -04/11/13 am--he is back in A fib with RVR, rates in the 130s. Cardiology called, appreciate input.  - ?plan for another cardioversion after more loading with Amiodarone.  - Heart rate today 130s  -Defer  chronotropic control to cardiology -Continue rivaroxaban Abdominal pain  - secondary to ileus, partial SBO as observed on admission -now resolved, patient with bowel movements -has a degree of abdominal pain which is stable.  -nausea/vomiting and eating as tolerated. Had few diarrheal episodes on 3/29, C diff was sent and was negative.  - continue supportive care with analgesia, antiemetics as needed for symptom control.  - nutrition consulted, appreciate input,  -patient is not meeting his nutritional needs based on oral intake.  - patient with intermittent confusion last week, which is now better, and on anticoagulation for his A fib, will hold off PEG tube placement. -TPN initiated 3/26 and that can be weaned off pending further evaluations by nutrition.  - continue oral intake as tolerated, had another evaluation 3/30, his diet was changed from Dys1 to Dys 2, patient eating better now.  Moderate malnutrition  - secondary to acute on chronic illness as outlined above  - pt tolerating well however does not seem to meet his nutritional requirements per nutrition  - on TPN, appreciate nutrition and pharmacy consults.  Leukocytosis  - likely secondary to ileus, HCAP as suggestive per CXR 3/20  - CT abd/pelvis with mild leak around anastomosis but no other acute events, and surgery signed off  - leukocytosis has now resolved   Dysuria -UA with reflex to urine culture HCAP -  -Finished one week cefepime and vancomycin 04/07/2013 -Remains afebrile without any respiratory distress Chronic respiratory failure -Continue trach collar 35% oxygenation Pulmonary vascular congestion - lung exam improving and stable.  - IVF stopped 3/20 due to pulmonary vascular congestion  - monitor I's/O's, daily weights  - weight trend: 203 lbs --> 195 --> 192 >> 194 >> 191 >>186  - repeat CXR 3/29 stable  Hypokalemia - continue supplementation. Magnesium normal.  Sacral ulcer - skin breakdown noted, closely monitor. Nursing care. No evidence of infection.  Anemia of chronic disease - no signs of active bleeding. Mild thrombocytopenia, stable. Likely multifactorial ?medication induced, nutritional deficiencies. Closely monitor.  Essential hypertension - within normal limits today  Tracheostomy status - suctioning as needed.  - Patient with a desaturation event 3/30 evening requiring suctioning.  Hypernatremia - TPN per pharmacy. Improving, supplemented 3/30 with D5W.     Family Communication:   Pt at beside Disposition Plan:   [name]       Procedures/Studies: Ct Abdomen Pelvis W Contrast  04/04/2013   CLINICAL DATA:  Liver flight non.  Leukocytosis.  EXAM: CT ABDOMEN AND PELVIS WITH CONTRAST  TECHNIQUE: Multidetector CT imaging of the abdomen and pelvis was performed using the standard protocol following bolus administration of intravenous contrast.  CONTRAST:  90mL OMNIPAQUE IOHEXOL 300 MG/ML  SOLN  COMPARISON:  DG ABD PORTABLE 1V dated 04/04/2013; CT ABD/PELVIS W CM dated 03/28/2013; CT ABD-PELV W/O CM dated 03/11/2013; CT ABD/PELVIS W CM dated 01/30/2013  FINDINGS: Stable moderate left and small right pleural effusions with associated passive atelectasis. Mild cardiomegaly.  Postoperative findings along the gastroesophageal junction.  Slightly reduce marginal definition of the infiltrative hypodense process involving the right  hepatic lobe and portions of segment 4 of the liver.  The spleen, adrenal glands, and pancreas unremarkable. Small amount of fluid along the gallbladder fossa and along the inferior edge of the right hepatic lobe could, similar to prior. Gallbladder surgically absent.  There is contrast medium in the renal collecting systems on the initial portal venous phase images, likely due to inadvertent early injection.  Stable renal hypodensities. Infrarenal IVC filter. Continued fluid along the inferior margin of the laparotomy wound. Trace fluid in the lower omentum. Stable trace presacral edema.  Bilateral chronic pars defects at L5 observed with 9 mm of anterolisthesis.  Orally administered contrast extends through to the rectum. Mild circumferential rectal wall thickening.  The patient seems to have had right hemicolectomy, with reanastomosis to the small bowel on image 39 of series 5. Outpouching from the proximal terminal margin of the remaining colon shown on images 32 through 21 of series 5, potentially some type of postoperative diverticulum or contained leak along the stable site.  IMPRESSION: 1. Similar distribution but reduced conspicuity of the peripheral infiltrative hepatic hypodensity affecting the right hepatic lobe an adjacent portion of segment 4. Fatty infiltration is the most common cause for and infiltrative hypodensity of this type, although localized parenchymal inflammation is not entirely excluded. MRI could differentiate if clinically warranted. 2. Small amount of perihepatic ascites, similar to prior. 3. Hypodense renal lesions, similar to prior. 4. Patient appears to have had right hemicolectomy with small bowel reanastomosis. There is a collection of gas and contrast extending to the right of the proximal terminal margin of the remaining colon, potentially a small contained leak along the staple line. 5. Continued fluid along the inferior margin of the laparotomy wound, but without internal gas  density currently.   Electronically Signed   By: Herbie Baltimore M.D.   On: 04/04/2013 18:40   Ct Abdomen Pelvis W Contrast  03/28/2013   CLINICAL DATA:  Abdominal distention.  EXAM: CT ABDOMEN AND PELVIS WITH CONTRAST  TECHNIQUE: Multidetector CT imaging of the abdomen and pelvis was performed using the standard protocol following bolus administration of intravenous contrast.  CONTRAST:  OMNIPAQUE IOHEXOL 300 MG/ML  SOLN  COMPARISON:  DG ABD ACUTE W/CHEST dated 03/28/2013; CT ABD-PELV W/O CM dated 03/11/2013; CT ABD/PELVIS W CM dated 01/30/2013  FINDINGS: There is a new lucency is again noted in the liver. This remains unchanged in appearance and may be infectious in etiology. No clearcut fluid collection in or air collection is noted in the liver to suggest a definite abscess. These changes may be related to phlegmon. Close follow up to evaluate for developing abscess suggested. Hepatic veins patent. Splenic and portal veins patent. No focal significant splenic abnormality. Pancreas normal. Cholecystectomy. No biliary distention.  Adrenals normal. Stable approximate 13 mm low-density lesion in the left kidney, not definite simple cyst. As noted on prior study, MRI of the kidneys suggest for further evaluation. No hydronephrosis. The bladder is nondistended. No free pelvic fluid. Prostate is not enlarged. Calcifications within the prostate. No significant adenopathy. Abdominal aorta is atherosclerotic. No aneurysm. Visceral vessels are patent. Inferior vena caval filter noted in the IVC with tip just below the renal veins.  Previously identified drainage catheter in the right upper quadrant has been removed. Inflammatory changes are noted in the right mid abdomen, no abscess noted. The patient has had a prior hemicolectomy.  Reference is made to CT report of 01/31/2012 which discusses fistula changes within the abdomen. Patient status post right hemicolectomy. The colon is moderately distended and contains  fluid levels. These changes have improved from prior exam suggesting improving ileus. These changes may be related to a diarrheal illness. Follow-up abdominal series suggested. No small bowel distention. The stomach is nondistended. No free air noted.  Atelectasis and/or infiltrates in the lung bases. Bilateral pleural effusions. Cardiomegaly. Coronary artery disease. Midline abdominal scar noted. Fluid noted in the region of the scar with associated small amount of air. Developing abscess should be considered. These findings have progressed slightly from prior study. Multiple anterior abdominal wall subcutaneous nodules most likely injection granulomas.  IMPRESSION: 1. Findings consistent with persistent phlegmon in the liver. 2. Interim removal of right upper quadrant drainage catheter. 3. Colonic distention with air-fluid levels. These findings have improved from prior study suggesting improving adynamic ileus or diarrheal illness. Followup abdominal series suggested. 4. Slight progression of soft tissue fluid in the anterior abdomen in the region the patient's scarring. This could represent developing phlegmon/abscess. Small amount of air is noted within this fluid collection. 5. 13 mm low-density lesion left kidney, not definite simple cyst. As noted on prior study nonenhanced and enhanced MRI of the kidneys suggested for further evaluation.   Electronically Signed   By: Maisie Fus  Register   On: 03/28/2013 23:29   Dg Chest Port 1 View  04/10/2013   CLINICAL DATA:  Cough and congestion.  EXAM: PORTABLE CHEST - 1 VIEW  COMPARISON:  DG CHEST 1V PORT dated 04/03/2013  FINDINGS: Tracheostomy and left-sided PICC line positioning are stable. The right jugular central line has been removed since the prior chest x-ray. Lungs show lower volumes bilaterally with increased prominence of bilateral lower lobe atelectasis. There may be a component of left-sided pleural fluid. The heart remains moderately enlarged with  evidence of prior placement of a left atrial appendage clip. No overt pulmonary edema is identified.  IMPRESSION: Lower volumes with increased prominence of bilateral lower lobe atelectasis. There may be a component of left-sided pleural fluid.   Electronically Signed   By: Irish Lack M.D.   On: 04/10/2013 11:29   Dg Chest Port 1 View  04/03/2013   CLINICAL DATA:  Central venous catheter placement  EXAM: PORTABLE CHEST - 1 VIEW  COMPARISON:  DG CHEST 1V PORT dated 04/01/2013  FINDINGS: Left upper extremity PICC is been placed. Tip is in the lower SVC. Tracheostomy tube and left atrial appendage clip stable. Left pleural effusion and basilar consolidation stable. Vascular congestion increased.  IMPRESSION: Left PICC placed with its tip at the lower SVC.  Increased vascular congestion.  Stable left basilar consolidation and left pleural effusion.   Electronically Signed   By: Maryclare Bean M.D.   On: 04/03/2013 09:04   Dg Chest Port 1 View  04/01/2013   CLINICAL DATA:  Leukocytosis  EXAM: PORTABLE CHEST - 1 VIEW  COMPARISON:  03/28/2013  FINDINGS: Postsurgical changes are again seen. A right-sided jugular central line is noted in the right innominate vein stable from the prior exam. The cardiac shadow is stable. Left basilar consolidation with increasing effusion is noted.  IMPRESSION: Increasing left basilar infiltrate and effusion.   Electronically Signed   By: Alcide Clever M.D.   On: 04/01/2013 09:10   Dg Abd 2 Views  03/30/2013   CLINICAL DATA:  Colonic distention.  Multiple abdominal surgeries.  EXAM: ABDOMEN - 2 VIEW  COMPARISON:  DG ABD 2 VIEWS dated 03/29/2013; CT ABD/PELVIS W CM dated 03/28/2013  FINDINGS: Trace blunting of the right costophrenic angle. No free intraperitoneal gas.  Dilated loops of left upper quadrant small bowel measure up to 4 cm, similar to the prior exam. There are several air-fluid levels within these small bowel loops. Reduced colonic gas compared to the prior exam. IVC filter  noted.  IMPRESSION: 1. Mildly dilated loops of left upper quadrant small bowel with scattered internal air-fluid levels, similar to the prior exam, with abnormal but nonspecific pattern which could be due to could ileus or partial small bowel obstruction. Correlate with bowel sounds and bowel function.   Electronically Signed   By: Herbie Baltimore M.D.   On: 03/30/2013 10:43   Dg Abd 2 Views  03/29/2013   CLINICAL DATA:  Lower abdominal pain. Nausea. Shortness of breath. Weakness.  EXAM: ABDOMEN - 2 VIEW  COMPARISON:  Abdominal radiograph 03/28/2013.  FINDINGS: Some colonic gas is noted. However, there are multiple borderline dilated of mildly dilated loops of gas-filled small bowel measuring up to 4.2 cm in diameter throughout the central abdomen and left side of the abdomen. No pneumoperitoneum appreciated on the left lateral decubitus view. Several small air-fluid levels are noted. Residual iodinated contrast material is noted within the lumen of the urinary bladder related to yesterday's CT examination. Numerous surgical clips are noted in the right upper quadrant of the abdomen. IVC filter projecting over either the right side at L2-L3.  IMPRESSION: 1. Nonspecific bowel gas pattern, as above, suggestive of partial small bowel obstruction. 2. No pneumoperitoneum.   Electronically Signed   By: Trudie Reed M.D.   On: 03/29/2013 13:38   Dg Abd Acute W/chest  03/28/2013   CLINICAL DATA:  Evaluate for a bowel obstruction.  EXAM: ACUTE ABDOMEN SERIES (ABDOMEN 2 VIEW & CHEST 1 VIEW)  COMPARISON:  None.  FINDINGS: Tracheostomy tube tip is above the carina. Heart size is mildly enlarged. The lung volumes are low. There is asymmetric elevation of the left hemidiaphragm. Left pleural effusion is noted.  Patient has an IVC filter. There are persistent abnormally dilated loops of small bowel which measure up to 5.5 cm. Gas is noted within the colon and rectum. There is no evidence of dilated bowel loops or free  intraperitoneal air. No radiopaque calculi or other significant radiographic abnormality is seen. Heart size and mediastinal contours are within normal limits. Both lungs are clear.  IMPRESSION: 1. Persistent small bowel dilatation compatible with either partial small bowel obstruction or ileus. 2. No change in aeration to the lungs compared with previous exam   Electronically Signed   By: Signa Kell M.D.   On: 03/28/2013 18:56   Dg Abd Portable 1v  04/07/2013   CLINICAL DATA:  Abdominal pain.  EXAM: PORTABLE ABDOMEN - 1 VIEW  COMPARISON:  None.  FINDINGS: The bowel gas pattern is normal. IVC filter seen at the level of L3. Surgical clips from prior cholecystectomy noted. No radio-opaque calculi or other significant radiographic abnormality are seen.  IMPRESSION: No acute findings.  Unremarkable bowel gas pattern.   Electronically Signed   By: Myles Rosenthal M.D.   On: 04/07/2013 15:26   Dg Abd Portable 1v  04/04/2013   CLINICAL DATA:  Evaluate barium  EXAM: PORTABLE ABDOMEN - 1 VIEW  COMPARISON:  03/30/2013  FINDINGS: Contrast material is noted within the transverse colon and splenic flexure of the colon. Small bowel loop distention has improved. Slight prominence of left  abdominal small bowel loops persists. No free air. No organomegaly. Prior cholecystectomy.  IMPRESSION: Improving small bowel distention. Small amount of oral contrast material within the transverse colon and splenic flexure of the colon.   Electronically Signed   By: Charlett Nose M.D.   On: 04/04/2013 10:18   Dg Swallowing Func-speech Pathology  04/11/2013   Riley Nearing Short, CCC-SLP     04/11/2013  3:10 PM Objective Swallowing Evaluation: Modified Barium Swallowing Study   Patient Details  Name: Tim Short MRN: [id number] Date of Birth: [birth year]  Today's Date: 04/11/2013 Time: 4098-1191 SLP Time Calculation (min): 25 min  Past Medical History:  Past Medical History  Diagnosis Date  . Hypertension   . Hypercholesteremia   .  Asthma   . Meniere disease   . Persistent atrial fibrillation     a. s/p multiple dccv's;  b. failed amio;  c. not felt to be AF  RFCA canddiate due to LA dil;  d. s/p failed hybrid ablation @  [facility] in 09/2012;  e. chronic pradaxa.  . Obesity   . Biatrial enlargement     LA size 5.3cm  . Obstructive sleep apnea     AHI 108/hr now on CPAP at 12cm H2O  . H/O hiatal hernia   . GERD (gastroesophageal reflux disease)     "associated w/hiatal hernia" (06/10/2012)  . Peptic ulcer 1950's  . Migraines     "haven't had one for about 15 years" (Jun 10, 2012)  . Arthritis     "joints" (2012-06-10)  . Chronic lower back pain   . Nephrolithiasis ~ 1960's    "passed on their own" (06-10-2012)  . History of pneumonia 2002; 2006    "spent 8 days in isolation; Norovirus" (2012-06-10)  . Other and unspecified angina pectoris   . RLS (restless legs syndrome)   . Coronary artery disease     a. 05/2012 Cath/PCI: LM nl, LAD nl, LCX 73m (3.0x15 Integrity  BMS), PTCA of OM1 through stent struts (kissing balloon).  . Diabetes mellitus without complication     FAMILY STATES PATIENT IS NOT DIABETIC   Past Surgical History:  Past Surgical History  Procedure Laterality Date  . Wrist fracture surgery  1992    "repaired w/left hip bone graft" (06-10-12)  . Total knee arthroplasty  08/19/1999  . Colonoscopy w/ polypectomy  2006  . Knee arthroscopy with meniscal repair Right 1974    "medial meniscus repaired" (2012-06-10)  . Cardioversion  10/02/2011    Procedure: CARDIOVERSION;  Surgeon: Corky Crafts, MD;   Location: [facility] ENDOSCOPY;  Service: Cardiovascular;  Laterality:  N/A;  h/p in file drawer  . Cardioversion  11/07/2011    Procedure: CARDIOVERSION;  Surgeon: Corky Crafts, MD;   Location: [facility] ENDOSCOPY;  Service: Cardiovascular;  Laterality:  N/A;  h/p from 10/22 in file drawer/dl  . Cardiac catheterization  05/26/2012  . Coronary angioplasty with stent placement  2012-06-10    "1" (06-10-2012)  . Cataract extraction w/ intraocular lens  implant,  bilateral   2009  . Hemorrhoid surgery  ~ 2003  . Hiatal hernia repair  1983  . Nissen fundoplication  1983  . Ablation of dysrhythmic focus  SEPT/OCT 2014    ATRIAL FIB  . Inguinal hernia repair Bilateral 2000 and 2005    "one done at a time" (2012-06-10)  . Colon surgery  10/08/2012   . 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 Pt was in  prolonged respiratory failure at [facility], unable to wean of the vent  and requiring trach placement 10/29/2012. He was transferred to  [facility] in December 2014 and later to [facility] in late January  2015. He was on TNA and was doing fairly well initially, but  developed more abdominal pain and on 2/23 taken to OR in [place]  for gallbladder removal (secondary to cholecystitis), resection  for part of the colon for fistula, IVC filter placement. Sent  back to [facility] on march 4th, 2015 and started on TPN. Diet  advanced on March 9th, 2015 after pt passed swallow evaluation.  He was brought to [facility] March 16th, after noticing progressive  abdominal distension. In ED, CT abdomen with air- fluid level and  findings consistent with early partial SBO, ileus. Surgery  reports pt is ok for PO from their standpoint, SLP ordered for  swallow eval.      Assessment / Plan / Recommendation Clinical Impression  Dysphagia Diagnosis: Moderate pharyngeal phase dysphagia Clinical impression: Pt demonstrates significant improvement in  motor function since last MBS. Pts primary decifits are now  exclusively sensory in nature. Pt has a delay in swallow  initiation with nectar and thin teaspoon amounts with aspiration  before the swallow. With a chin tuck, the pt is albe to protect  the airway before the swallow with nectar  vea teaspoon or straw.  Pt is also able to masticate soft and regular textured solids. He  was noted to have expectoration of mucous/barium after the test,  possible esophageal component not visualized due to body habitus.  Pt is recommended to upgrade to a dys 2 Finely chopped diet with  nectar thick liquids via spoon or straws with full supervison for  a chin tuck and placement of PMSV. Pt may upgrade solids if he  tolerates dys 2 diet.     Treatment Recommendation  Therapy as outlined in treatment plan below    Diet Recommendation Dysphagia 2 (Fine chop);Nectar-thick liquid   Liquid Administration via: Spoon;Straw Medication Administration: Crushed with puree Supervision: Patient able to self feed;Full supervision/cueing  for compensatory strategies Compensations: Slow rate;Small sips/bites Postural Changes and/or Swallow Maneuvers: Chin tuck;Seated  upright 90 degrees    Other  Recommendations Oral Care Recommendations: Oral care BID Other Recommendations: Place PMSV during PO intake;Order  thickener from pharmacy;Have oral suction available   Follow Up Recommendations  Skilled Nursing facility    Frequency and Duration min 2x/week  2 weeks   Pertinent Vitals/Pain NA    SLP Swallow Goals     General HPI: Pt is 79 yo male with fairly recent hospitalization  at [facility] in September 2014 for failed maize procedure and during  that hospitalization pt developed volvulus and has required right  colectomy (10/08/2012), subsequently 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 Pt was in  prolonged respiratory failure at [facility], unable to wean of the vent  and requiring trach placement 10/29/2012. He was transferred to  [facility] in December 2014 and later to [facility] in late January  2015. He was on TNA and was doing fairly well initially, but  developed more abdominal pain and on 2/23 taken to OR in [place]  for gallbladder removal (secondary to cholecystitis), resection  for part of the colon  for fistula, IVC filter placement. Sent  back to [facility] on march 4th, 2015 and started on TPN. Diet  advanced on March 9th, 2015 after pt passed swallow evaluation.  [facility], after noticing progressive  abdominal distension. In ED, CT abdomen with air- fluid level and  findings consistent with early partial SBO, ileus. Surgery  reports pt is ok for PO from their standpoint, SLP ordered for  swallow eval.  Type of Study: Modified Barium Swallowing Study Reason for Referral: Objectively evaluate swallowing function Previous Swallow Assessment: [facility] hospital - 3/16 pt "passed"  FEES and was started on diet, began to decline and blue dye test  was given, pt silently aspirated thin, nectar, honey per report.  Diet Prior to this Study: Dysphagia 1 (puree);Pudding-thick  liquids Temperature Spikes Noted: No Respiratory Status: Trach collar Trach Size and Type: Cuff;Deflated;With PMSV in place;#6;Other  (Comment) History of Recent Intubation: No Behavior/Cognition: Alert;Cooperative;Requires cueing;Hard of  hearing Oral Cavity - Dentition: Missing dentition Oral Motor / Sensory Function: Impaired - see Bedside swallow  eval Self-Feeding Abilities: Able to feed self;Needs assist Patient Positioning: Upright in chair Baseline Vocal Quality: Low vocal intensity Volitional Cough: Strong Volitional Swallow: Able to elicit Anatomy: Within functional limits Pharyngeal Secretions: Not observed secondary MBS    Reason for Referral Objectively evaluate swallowing function   Oral Phase Oral Preparation/Oral Phase Oral Phase: Impaired Oral Phase - Comment Oral Phase - Comment: missing dentition, but places boluse  effectively between remaining teeth. otherwise WFL.    Pharyngeal Phase Pharyngeal Phase Pharyngeal Phase: Impaired Pharyngeal - Honey Pharyngeal - Honey Teaspoon: Not tested Pharyngeal - Honey Cup: Not tested Pharyngeal - Nectar Pharyngeal - Nectar Teaspoon: Delayed swallow   initiation;Penetration/Aspiration before swallow (chin tuck  increases airway closure, preents penetration) Penetration/Aspiration details (nectar teaspoon): Material enters  airway, passes BELOW cords without attempt by patient to eject  out (silent aspiration);Material does not enter airway Pharyngeal - Nectar Straw: Delayed swallow initiation (chin tuck) Pharyngeal - Thin Pharyngeal - Thin Teaspoon: Delayed swallow  initiation;Penetration/Aspiration before swallow (chin tuck) Penetration/Aspiration details (thin teaspoon): Material enters  airway, CONTACTS cords and not ejected out Pharyngeal - Solids Pharyngeal - Puree: Delayed swallow initiation (chin tuck) Pharyngeal - Mechanical Soft: Delayed swallow initiation Pharyngeal - Regular: Delayed swallow initiation  Cervical Esophageal Phase    GO             Tim DittyBonnie DeBlois, MA CCC-SLP [phone]  Tim Short, Tim Short 04/11/2013, 3:08 PM    Dg Swallowing Func-speech Pathology  04/02/2013   Breck CoonsLisa Willis El PortalLitaker, CCC-SLP     04/02/2013  2:03 PM Objective Swallowing Evaluation: Modified Barium Swallowing Study   Patient Details  Name: Tim BarerJames B Short MRN: [id number] Date of Birth: 11/04/1934  Today's Date: 04/02/2013 Time: 1225-1250 SLP Time Calculation (min): 25 min  Past Medical History:  Past Medical History  Diagnosis Date  . Hypertension   . Hypercholesteremia   . Asthma   . Meniere disease   . Persistent atrial fibrillation     a. s/p multiple dccv's;  b. failed amio;  c. not felt to be AF  RFCA canddiate due to LA dil;  d. s/p failed hybrid ablation @  [facility] in 09/2012;  e. chronic pradaxa.  . Obesity   . Biatrial enlargement     LA size 5.3cm  . Obstructive sleep apnea     AHI 108/hr now on CPAP at 12cm H2O  . H/O hiatal hernia   . GERD (gastroesophageal  reflux disease)     "associated w/hiatal hernia" (06/24/2012)  . Peptic ulcer 1950's  . Migraines     "haven't had one for about 15 years" (06-24-2012)  . Arthritis     "joints" (2012/06/24)  . Chronic lower back pain    . Nephrolithiasis ~ 1960's    "passed on their own" (2012-06-24)  . History of pneumonia 2002; 2006    "spent 8 days in isolation; Norovirus" (June 24, 2012)  . Other and unspecified angina pectoris   . RLS (restless legs syndrome)   . Coronary artery disease     a. 05/2012 Cath/PCI: LM nl, LAD nl, LCX 79m (3.0x15 Integrity  BMS), PTCA of OM1 through stent struts (kissing balloon).  . Diabetes mellitus without complication     FAMILY STATES PATIENT IS NOT DIABETIC   Past Surgical History:  Past Surgical History  Procedure Laterality Date  . Wrist fracture surgery  1992    "repaired w/left hip bone graft" (06/24/2012)  . Total knee arthroplasty  08/19/1999  . Colonoscopy w/ polypectomy  2006  . Knee arthroscopy with meniscal repair Right 1974    "medial meniscus repaired" (Jun 24, 2012)  . Cardioversion  10/02/2011    Procedure: CARDIOVERSION;  Surgeon: Corky Crafts, MD;   Location: [facility] ENDOSCOPY;  Service: Cardiovascular;  Laterality:  N/A;  h/p in file drawer  . Cardioversion  11/07/2011    Procedure: CARDIOVERSION;  Surgeon: Corky Crafts, MD;   Location: [facility] ENDOSCOPY;  Service: Cardiovascular;  Laterality:  N/A;  h/p from 10/22 in file drawer/dl  . Cardiac catheterization  05/26/2012  . Coronary angioplasty with stent placement  24-Jun-2012    "1" (06-24-2012)  . Cataract extraction w/ intraocular lens  implant, bilateral   2009  . Hemorrhoid surgery  ~ 2003  . Hiatal hernia repair  1983  . Nissen fundoplication  1983  . Ablation of dysrhythmic focus  SEPT/OCT 2014    ATRIAL FIB  . Inguinal hernia repair Bilateral 2000 and 2005    "one done at a time" (June 24, 2012)  . Colon surgery  10/08/2012   . 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 Pt was in  prolonged respiratory failure at [facility], unable to wean of the vent  and requiring trach placement 10/29/2012. He was transferred to  [facility] in December 2014 and later to [facility] in late January  2015. He was on TNA and was doing fairly well initially, but  developed more abdominal pain and on 2/23 taken to OR in [place]  for gallbladder removal (secondary to cholecystitis), resection  for part of the colon for fistula, IVC filter placement. Sent  back to [facility] on march 4th, 2015 and started on TPN. Diet  advanced on March 9th, 2015 after pt passed swallow evaluation.  He was brought to [facility] March 16th, after noticing progressive  abdominal distension. In ED, CT abdomen with air- fluid level and  findings consistent with early partial SBO, ileus. Surgery  reports pt is ok for PO from their standpoint, SLP ordered for  swallow eval.      Assessment / Plan / Recommendation Clinical Impression  Dysphagia Diagnosis: Moderate pharyngeal phase dysphagia;Severe  pharyngeal phase dysphagia Clinical impression: MBS completed with pt's PMSV donned and  appears to be congruent with study performed at [facility].  He  demonstrated moderate-severe motor based pharyngeal dyspahgia as  evidenced by reduced tongue base retraction, decreased laryngeal  elevation and decreased pharyngeal contraction.  Impairments led  to mild pharyngeal residue in vallecule/pyriform sinuses and  posterior pharyngeal wall and laryngeal vestibule consistently  silent penetration to vocal cords.  Verbal cues for hard cough  briefly cleared vestibule with subsequent penetration from  residue.  SLP recommends Dys 1 (puree) only, no liquids (pudding  thick liquid) with speaking valve donned, two swallows,  volitional coughs, pills crushed in applesauce and full  supervision/assist.       Treatment Recommendation  Therapy as outlined in treatment plan below    Diet Recommendation Dysphagia 1 (Puree);Pudding-thick liquid   Medication  Administration: Crushed with puree Supervision: Patient able to self feed;Full supervision/cueing  for compensatory strategies Compensations: Slow rate;Small sips/bites;Multiple dry swallows  after each bite/sip;Hard cough after swallow Postural Changes and/or Swallow Maneuvers: Seated upright 90  degrees;Upright 30-60 min after meal, wear speaking valve during  all po's    Other  Recommendations Oral Care Recommendations: Oral care BID Other Recommendations: Order thickener from pharmacy   Follow Up Recommendations  Skilled Nursing facility    Frequency and Duration min 2x/week  2 weeks   Pertinent Vitals/Pain WDL            Reason for Referral Objectively evaluate swallowing function   Oral Phase Oral Preparation/Oral Phase Oral Phase: WFL (WFL for textures assessed)   Pharyngeal Phase Pharyngeal Phase Pharyngeal Phase: Impaired Pharyngeal - Honey Pharyngeal - Honey Teaspoon: Reduced pharyngeal  peristalsis;Pharyngeal residue - valleculae;Reduced tongue base  retraction;Penetration/Aspiration during swallow;Pharyngeal  residue - pyriform sinuses;Reduced laryngeal elevation Penetration/Aspiration details (honey teaspoon): Material enters  airway, CONTACTS cords and not ejected out Pharyngeal - Honey Cup: Penetration/Aspiration during  swallow;Pharyngeal residue - valleculae;Pharyngeal residue -  pyriform sinuses;Reduced tongue base retraction;Reduced laryngeal  elevation Penetration/Aspiration details (honey cup): Material enters  airway, remains ABOVE vocal cords then ejected out Pharyngeal - Solids Pharyngeal - Puree: Pharyngeal residue - valleculae;Reduced  tongue base retraction;Pharyngeal residue - pyriform  sinuses;Reduced laryngeal elevation  Cervical Esophageal Phase        Cervical Esophageal Phase Cervical Esophageal Phase: [facility]         Darrow Bussing.Ed CCC-SLP Pager [phone]  04/02/2013         Subjective: Patient complains of shortness of breath but states that this is no change from  previous days. No reports of oxygen desaturation. Denies any fevers, headache, chest discomfort, vomiting.  C/o dysuria  Objective: Filed Vitals:   04/13/13 0801 04/13/13 1019 04/13/13 1208 04/13/13 1421  BP:  110/78    Pulse: 138 134 128 147  Temp:      TempSrc:      Resp: 24  22 22   Height:      Weight:      SpO2: 98%  97% 97%    Intake/Output Summary (Last 24 hours) at 04/13/13 1459 Last data filed at 04/13/13 1200  Gross per 24 hour  Intake      0 ml  Output    801 ml  Net   -801 ml   Weight change:  Exam:   General:  Pt is alert, follows commands appropriately, not in acute distress  HEENT: No icterus, No thrush,  [place]/AT  Cardiovascular: IRRR, S1/S2, no rubs, no gallops  Respiratory: Bilateral scattered rales. No wheezing. Good air movement.  Abdomen: Soft/+BS, non tender,  non distended, no guarding  Extremities: No edema, No lymphangitis, No petechiae, No rashes, no synovitis  Data Reviewed: Basic Metabolic Panel:  Recent Labs Lab 04/08/13 0500 04/09/13 0416 04/10/13 0530 04/11/13 0456 04/11/13 1745 04/12/13 0525 04/13/13 0605  NA 155* 155* 151* 152* 151* 149* 148*  K 3.0* 3.3* 3.4* 3.6* 3.6* 3.3* 3.3*  CL 118* 116* 114* 114* 113* 111 110  CO2 28 27 26 26 27 29 27   GLUCOSE 161* 188* 206* 221* 220* 183* 181*  BUN 13 19 25* 25* 25* 24* 21  CREATININE 0.76 0.73 0.69 0.57 0.60 0.59 0.59  CALCIUM 8.6 8.9 8.7 8.8 8.8 8.8 8.5  MG 2.1 2.2 2.2 2.2  --   --   --   PHOS 2.1* 2.9 2.8 3.1  --   --   --    Liver Function Tests:  Recent Labs Lab 04/08/13 0500 04/09/13 0416 04/10/13 0530 04/11/13 0456 04/12/13 0525  AST 34 32 27 19 20   ALT 39 42 38 33 27  ALKPHOS 70 68 63 68 64  BILITOT 0.3 0.3 0.3 0.4 0.4  PROT 4.9* 5.1* 4.9* 5.1* 4.9*  ALBUMIN 2.3* 2.4* 2.3* 2.3* 2.1*   No results found for this basename: LIPASE, AMYLASE,  in the last 168 hours No results found for this basename: AMMONIA,  in the last 168 hours CBC:  Recent Labs Lab  04/08/13 0500 04/09/13 0416 04/10/13 0530 04/11/13 0456 04/12/13 0525 04/13/13 0605  WBC 8.8 11.2* 9.9 10.3 8.9 7.0  NEUTROABS 6.9  --   --  8.6*  --   --   HGB 8.6* 8.5* 8.4* 8.7* 8.5* 8.5*  HCT 27.9* 28.1* 27.7* 28.5* 28.4* 27.6*  MCV 98.9 98.6 98.9 98.6 99.3 96.8  PLT 130* 125* 114* 114* 100* 105*   Cardiac Enzymes: No results found for this basename: CKTOTAL, CKMB, CKMBINDEX, TROPONINI,  in the last 168 hours BNP: No components found with this basename: POCBNP,  CBG:  Recent Labs Lab 04/12/13 1107 04/12/13 1607 04/12/13 2149 04/13/13 0644 04/13/13 1106  GLUCAP 171* 197* 163* 159* 177*    Recent Results (from the past 240 hour(s))  CLOSTRIDIUM DIFFICILE BY PCR     Status: None   Collection Time    04/10/13  9:03 AM      Result Value Ref Range Status   C difficile by pcr NEGATIVE  NEGATIVE Final     Scheduled Meds: . amiodarone  400 mg Oral BID  . atorvastatin  40 mg Oral Daily  . chlorhexidine  15 mL Mouth/Throat BID  . diltiazem  240 mg Oral Daily  . feeding supplement (ENSURE)  1 Container Oral TID BM  . hydrocortisone cream   Topical BID  . insulin aspart  0-9 Units Subcutaneous TID WC  . pantoprazole  40 mg Oral Daily  . predniSONE  5 mg Oral Q breakfast  . rivaroxaban  20 mg Oral Q supper  . roflumilast  500 mcg Oral Daily  . sodium chloride  10-40 mL Intracatheter Q12H  . sodium chloride  3 mL Intravenous Q12H  . sodium chloride  3 mL Intravenous Q12H  . sodium chloride  3 mL Intravenous Q12H   Continuous Infusions: . sodium chloride    . sodium chloride    . dextrose 5 % and 0.45 % NaCl with KCl 20 mEq/L 50 mL/hr at 04/13/13 0916  . Marland KitchenTPN (CLINIMIX-E) Adult 60 mL/hr at 04/12/13 1706   And  . fat emulsion 250 mL (04/12/13 1706)  . Marland Kitchen  TPN (CLINIMIX-E) Adult     And  . fat emulsion       Evee Liska, DO  [facility] Pager [phone]  If 7PM-7AM, please contact night-coverage [email or web address] Password [email or web address] 04/13/2013, 2:59 PM   LOS: 16  days

## 2013-04-14 LAB — GLUCOSE, CAPILLARY
GLUCOSE-CAPILLARY: 186 mg/dL — AB (ref 70–99)
Glucose-Capillary: 131 mg/dL — ABNORMAL HIGH (ref 70–99)
Glucose-Capillary: 207 mg/dL — ABNORMAL HIGH (ref 70–99)
Glucose-Capillary: 132 mg/dL — ABNORMAL HIGH (ref 70–99)

## 2013-04-14 LAB — COMPREHENSIVE METABOLIC PANEL
ALK PHOS: 76 U/L (ref 39–117)
ALT: 69 U/L — AB (ref 0–53)
AST: 79 U/L — ABNORMAL HIGH (ref 0–37)
Albumin: 2.2 g/dL — ABNORMAL LOW (ref 3.5–5.2)
BILIRUBIN TOTAL: 0.3 mg/dL (ref 0.3–1.2)
BUN: 19 mg/dL (ref 6–23)
CO2: 28 meq/L (ref 19–32)
Calcium: 8.6 mg/dL (ref 8.4–10.5)
Chloride: 109 mEq/L (ref 96–112)
Creatinine, Ser: 0.57 mg/dL (ref 0.50–1.35)
GLUCOSE: 168 mg/dL — AB (ref 70–99)
POTASSIUM: 3.6 meq/L — AB (ref 3.7–5.3)
Sodium: 148 mEq/L — ABNORMAL HIGH (ref 137–147)
Total Protein: 5 g/dL — ABNORMAL LOW (ref 6.0–8.3)

## 2013-04-14 LAB — MAGNESIUM: Magnesium: 2.1 mg/dL (ref 1.5–2.5)

## 2013-04-14 LAB — URINE CULTURE: Colony Count: 8000

## 2013-04-14 LAB — PHOSPHORUS: Phosphorus: 3.5 mg/dL (ref 2.3–4.6)

## 2013-04-14 MED ORDER — DILTIAZEM HCL ER 240 MG PO CP24
240.0000 mg | ORAL_CAPSULE | Freq: Two times a day (BID) | ORAL | Status: DC
  Administered 2013-04-14 – 2013-04-18 (×9): 240 mg via ORAL
  Filled 2013-04-14 (×13): qty 1

## 2013-04-14 MED ORDER — AMIODARONE HCL 200 MG PO TABS
200.0000 mg | ORAL_TABLET | Freq: Two times a day (BID) | ORAL | Status: DC
Start: 2013-04-14 — End: 2013-05-04
  Administered 2013-04-14 – 2013-05-04 (×38): 200 mg via ORAL
  Filled 2013-04-14 (×45): qty 1

## 2013-04-14 MED ORDER — POTASSIUM CHLORIDE 20 MEQ/15ML (10%) PO LIQD
20.0000 meq | ORAL | Status: AC
  Administered 2013-04-14 (×2): 20 meq via ORAL
  Filled 2013-04-14 (×2): qty 15

## 2013-04-14 MED ORDER — M.V.I. ADULT IV INJ
INTRAVENOUS | Status: AC
  Administered 2013-04-14: 17:00:00 via INTRAVENOUS
  Filled 2013-04-14: qty 1000

## 2013-04-14 MED ORDER — FAT EMULSION 20 % IV EMUL
240.0000 mL | INTRAVENOUS | Status: AC
  Administered 2013-04-14: 240 mL via INTRAVENOUS
  Filled 2013-04-14: qty 250

## 2013-04-14 NOTE — Progress Notes (Signed)
PARENTERAL NUTRITION CONSULT NOTE - FOLLOW UP  Pharmacy Consult for TPN Indication: inadequate PO intake, resolving SBO  Allergies  Allergen Reactions  . Corticosteroids     Inhaled Corticosteroids--Hoarseness, dry mouth  . Furosemide Other (See Comments)    Ototoxicity     Patient Measurements: Height: 5' 8.9" (175 cm) Weight: 196 lb (88.905 kg) IBW/kg (Calculated) : 70.47   Vital Signs: Temp: 98.1 F (36.7 C) (04/02 0409) Temp src: Oral (04/02 0409) BP: 110/67 mmHg (04/02 0409) Pulse Rate: 134 (04/02 0842) Intake/Output from previous day: 04/01 0701 - 04/02 0700 In: 120 [P.O.:120] Out: 650 [Urine:650] Intake/Output from this shift:    Labs:  Recent Labs  04/11/13 1745 04/12/13 0525 04/13/13 0605  WBC  --  8.9 7.0  HGB  --  8.5* 8.5*  HCT  --  28.4* 27.6*  PLT  --  100* 105*  INR 1.14  --   --      Recent Labs  04/12/13 0525 04/13/13 0605 04/14/13 0530  NA 149* 148* 148*  K 3.3* 3.3* 3.6*  CL 111 110 109  CO2 29 27 28   GLUCOSE 183* 181* 168*  BUN 24* 21 19  CREATININE 0.59 0.59 0.57  CALCIUM 8.8 8.5 8.6  MG  --   --  2.1  PHOS  --   --  3.5  PROT 4.9*  --  5.0*  ALBUMIN 2.1*  --  2.2*  AST 20  --  79*  ALT 27  --  69*  ALKPHOS 64  --  76  BILITOT 0.4  --  0.3   Estimated Creatinine Clearance: 85.2 ml/min (by C-G formula based on Cr of 0.57).    Recent Labs  04/13/13 1645 04/13/13 2145 04/14/13 0626  GLUCAP 167* 156* 131*    Insulin Requirements in the past 24 hours:  5 units of Novolog + 15 units regular insulin in TPN bag  Current Nutrition:  Dysphagia 2 diet with poor PO intake documented (0-25%) Ensure pudding po TID- each supplement provides 170kcal and 4g protein Clinimix E 5/15 at 60 ml/hr + 20% lipid emulsion at 10 ml/hr.  This provides 1502kCal and 72g protein per day.  Nutritional Goals:  2050-2250 kCal, 110-120 grams of protein per day  Admit: Transferred from [place] with abdominal pain, partial SBO, ileus.  GI:  resolved ileus per surgery.  Fall 2014 - volvulus, multiple abd surgeries, fistula development, was on TPN long-term but recently stopped and was tolerating PO intake per report. Now with poor PO intake. Now on Dysphagia 2 diet w/ no N/V and good bowel movement. Per MD will use TPN for support since not planning to place PEG due to anticoagulation (Xarelto). Noted pt with diarrhea 3/28 but improved 3/29. Cdiff negative. Please see RD note 3/27: recs for short-term enteral support via NGT is appropriate as he has a functioning GI tract. Prealbumin 11.4 (low) on 3/30.   Endo: CBGs in last 24 hours 131-177, requiring some SSI, with 15 units of insulin in TPN bag. On PO prednisone  Lytes: Na 148 (trending down); K 3.6, Phos 3.5, Mag 2.1. Currently on D5-1/2NS with 21mq K @ 522mhr for hypernatremia and hypokalemia  Renal: SCr stable  Pulm: Trach collar, Daliresp  Cards: Afib - VSS (NSR s/p cardioversion 3/27); diltiazem, Xarelto- per EP to continue without interruption, if patient needs to have PEG placed, may need to discuss transitioning to heparin for procedure and then resuming Xarelto post-op. Continuing amiodarone 20051mID and will repeat cardioversion  in 3-4 weeks  Hepatobil: AST and ALT increased to 79 and 69, respectivelyLFTs wnl, alk phos nml, trigs normal, Albumin 2.2  Neuro: A&O  ID: completed a 7d course of ABX for HCAP 3/26  Best Practices: Xarelto for atrial fibrillation  TPN Access: PICC placed 3/22  TPN day#: 8  Plan:  1. Discussed with [name], RD- will decrease Clinimix E 5/15 to 40 ml/hr + 20% lipid emulsion at 10 ml/hr in an attempt to increase appetite. This provides 48g protein and 1162 kcal daily   2. F/u po intake and ability continue to wean TPN off. Consider placing a feeding tube of some sort and feeding enterally as his gut works. TPN not appropriate nutrition support in this pt with functioning GI tract. 3. Continue CBGs and SSI TID with meals. Will  decrease insulin in TPN bag to 10 units as rate is decreasing.  4. Daily MVI; trace elements M/W/F only due to national shortage  5. BMET, phos and mag in the morning 6. Replete K with 32mq KCl liquid x2 doses  Janella Rogala D. Rahmah Mccamy, PharmD, BCPS Clinical Pharmacist Pager: [phone]/02/2013 9:18 AM

## 2013-04-14 NOTE — Progress Notes (Signed)
Physical Therapy Treatment Patient Details Name: Tim Short MRN: [id number] DOB: 03/28/1934 Today's Date: 04/14/2013    History of Present Illness 79 yo male with recent complications at [facility] after an ablation for afib in oct 2014 he developed a volvulus after the ablation was intubated/trachedm required multiple abd surgeries then developed fistula details unknown.  Has been on tpn, with ngt on/off since. Is at [facility] sent here for complaints of abd pain.  Pt was taken off tpn and feeding tube was removed last week.  He has been eating by mouth and doing well.  But last 4 days with worsening abd pain, no n/v/d.  No fevers (however is was started on vanc and zosyn for unknown reasons).  Pt na level is elevated, and appears dehydrated. 04/08/13 Pt now reports that he is having vertigo which has ebbed and flowed for the  past 14 years.    PT Comments    Pt. Had gotten OOB to transfer with OT and bed raised a short while earlier than start of PT. He was not able to stand fully from low recliner chair so could not pivot back to bed.  Pt.  was assisted back to bed via maxi move and PT/ nursing assist.  Pt. Fatigues easliy but appeared to enjoy being OOB briefly.  Follow Up Recommendations  SNF     Equipment Recommendations  Other (comment) (tba at final venue before home)    Recommendations for Other Services       Precautions / Restrictions Precautions Precautions: Fall Precaution Comments: has had vertigo every day UNITL today when getting up to EOB, VERY HOH Restrictions Weight Bearing Restrictions: No Other Position/Activity Restrictions: Got dizzy sitting up EOB with some room spinning    Mobility  Bed Mobility Overal bed mobility: Needs Assistance Bed Mobility: Rolling Rolling: Mod assist         General bed mobility comments: Pt. lifted  back to bed with maxi move; assisted to supine with HOB elevated.  He needed mod assist to roll to either side for pad  removal  Transfers Overall transfer level: Needs assistance Equipment used: Rolling walker (2 wheeled) Transfers: Sit to/from Stand Sit to Stand: +2 physical assistance;Max assist         General transfer comment: Pt. needed 2 assist to attempt to stand and third pweson to manage equipment.  Pt. not able to achieve full stance as he could not extend hips and knees.  Pt. was in low recliner and had been able to stand from raised bed earlier..  Pt. was assisted to partial standing a second time for pad placement for use of maxi move back to bed with PT and nursing assist.  Ambulation/Gait Ambulation/Gait assistance:  (pt. unable)               Stairs            Wheelchair Mobility    Modified Rankin (Stroke Patients Only)       Balance                                    Cognition Arousal/Alertness: Awake/alert Behavior During Therapy: WFL for tasks assessed/performed Overall Cognitive Status: Within Functional Limits for tasks assessed                      Exercises      General Comments  Pertinent Vitals/Pain See vitals tab     Home Living                      Prior Function            PT Goals (current goals can now be found in the care plan section) Progress towards PT goals: Progressing toward goals    Frequency  Min 3X/week    PT Plan Current plan remains appropriate    Co-evaluation             End of Session Equipment Utilized During Treatment: Gait belt;Oxygen Activity Tolerance: Patient tolerated treatment well;Patient limited by fatigue Patient left: in bed;with call bell/phone within reach     Time: 7846-9629 PT Time Calculation (min): 33 min  Charges:  $Therapeutic Activity: 23-37 mins                    G Codes:      Ferman Hamming 04/14/2013, 4:25 PM Weldon Picking PT Acute Rehab Services [phone] Beeper [phone]

## 2013-04-14 NOTE — Progress Notes (Signed)
    Subjective:  No events overnight, remains in atrial flutter (atypical)  Objective:  Filed Vitals:   04/13/13 2020 04/14/13 0100 04/14/13 0409 04/14/13 0526  BP:   110/67   Pulse: 123 134 105 133  Temp:   98.1 F (36.7 C)   TempSrc:   Oral   Resp: 20 20 20 20   Height:      Weight:   196 lb (88.905 kg)   SpO2: 96% 96% 98% 95%    Intake/Output from previous day:  Intake/Output Summary (Last 24 hours) at 04/14/13 0738 Last data filed at 04/13/13 1814  Gross per 24 hour  Intake    120 ml  Output    650 ml  Net   -530 ml    Physical Exam: Physical exam: Well-developed chronically ill appearing in no acute distress.  Sleeping but rouses Skin is warm and dry.  OP clear Neck is supple.  Chest with coarse BS Cardiovascular exam tachycardic irregular rhythm Abdominal exam s/p abdominal surgery Extremities shows + dependant edema. neuro grossly intact   Lab Results: Basic Metabolic Panel:  Recent Labs  16/10/9602/01/15 0605 04/14/13 0530  NA 148* 148*  K 3.3* 3.6*  CL 110 109  CO2 27 28  GLUCOSE 181* 168*  BUN 21 19  CREATININE 0.59 0.57  CALCIUM 8.5 8.6  MG  --  2.1  PHOS  --  3.5   CBC:  Recent Labs  04/12/13 0525 04/13/13 0605  WBC 8.9 7.0  HGB 8.5* 8.5*  HCT 28.4* 27.6*  MCV 99.3 96.8  PLT 100* 105*     Assessment/Plan:  1 atrial flutter He has returned to atrial flutter post cardioversion with tele showing V rates 120s Continue xarelto without interuption Increase diltiazem back to 240mg  BID Continue amiodarone 200mg  BID Repeat cardioversion in 3-4 weeks  2 Coronary artery disease-continue statin.  3 ileus-management per general surgery.  4 hypertension-continue present medications.  5. Elevated LFTs- primary team to follow closely  Will arrange follow-up in our office with Tereso NewcomerScott Weaver in 3-4 weeks to see if he is still in atrial flutter.  IF so then we will arrange cardioversion at that time.  He can be transferred back to SNF from EP  standpoint at this time. Electrophysiology team to see as needed while here. Please call with questions.

## 2013-04-14 NOTE — Progress Notes (Signed)
SLP Cancellation Note  Patient Details Name: Tim Short MRN: [id number] DOB: 12/19/1934   Cancelled treatment:        Pt. S/p working with PT and then OT, as well as up in chair.  Pt. Now back to bed and exhausted.  Unble to remain alert adequately for swallow therapy.  Will return next day.   Maryjo RochesterWillis, Zendaya Groseclose T 04/14/2013, 4:55 PM

## 2013-04-14 NOTE — Progress Notes (Addendum)
[facility] PROGRESS NOTE  GIANFRANCO ARAKI ZOX:[id number] DOB: 1934-02-15 DOA: 03/28/2013 PCP:  Duane Lope, MD  Assessment/Plan:  Atrial fibrillation/flutter  - rate in 120's - 130's  - appreciate cardiology/EP following  -s/p cardioversion 3/27, restored sinus rhythm and maintained over the weekend -04/11/13 am--he is back in A fib with RVR, rates in the 130s.  -Cardiology reconsulted, appreciate input.  - continue amiodarone 200mg  bid per EP - Heart rate today 130s  -Defer chronotropic control to cardiology--diltiazem CD increased to 240bid -Continue rivaroxaban  Abdominal pain  - secondary to ileus, partial SBO as observed on admission  -now resolved, patient with bowel movements  -has a degree of abdominal pain which is stable.  -Had few loose stool episodes on 3/29, C diff was sent and was negative.  - continue supportive care with analgesia, antiemetics as needed for symptom control.  - nutrition consulted, appreciate input,  -patient is not meeting his nutritional needs based on oral intake.  - patient with intermittent confusion last week, which is now better, and on anticoagulation for his A fib, will hold off PEG tube placement.  -TPN initiated 3/26 and that can be weaned off pending further evaluations by nutrition.  -long discussion with son regarding risks and benefits of enteral feeding vs TPN--he is amenable to TPN short term to see if there is any clinical improvement -he is also agreeable to enteral feedings (pt had NG feedings in past) and G- or J-tube as more long term solution if no improvement with TPN -continue oral intake as tolerated, had another swallow evaluation 3/30, his diet was changed from Dys1 to Dys 2, Moderate malnutrition  - secondary to acute on chronic illness as outlined above  - pt tolerating well however does not seem to meet his nutritional requirements per nutrition  - on TPN, appreciate nutrition and pharmacy consults.    Thrombocytopenia -Suspect myelosuppression due to acute medical illness -Check serum B12, RBC folate -Peripheral smear -Fibrinogen -HIT panel -INR 1.14 Leukocytosis  - likely secondary to ileus, HCAP as suggestive per CXR 3/20  - CT abd/pelvis with mild leak around anastomosis but no other acute events, and surgery signed off  - leukocytosis has now resolved  Dysuria  -UA neg for pyuria HCAP -  -Finished one week cefepime and vancomycin 04/07/2013  -Remains afebrile without any respiratory distress  Chronic respiratory failure  -Continue trach collar 35% oxygenation  -clinically stable Pulmonary vascular congestion - lung exam improving and stable.  - IVF stopped 3/20 due to pulmonary vascular congestion  - monitor I's/O's, daily weights  - weight trend: 203 lbs --> 195 --> 192 >> 194 >> 191 >>186  - repeat CXR 3/29 stable  Hypokalemia - continue supplementation. Magnesium normal.  Sacral ulcer - skin breakdown noted, closely monitor. Nursing care. No evidence of infection.  Anemia of chronic disease - no signs of active bleeding. Mild thrombocytopenia, stable. Likely multifactorial ?medication induced, nutritional deficiencies. Closely monitor.  Essential hypertension - within normal limits today  Tracheostomy status - suctioning as needed.  - Patient with a desaturation event 3/30 evening requiring suctioning.  -pulmonary toilet Hypernatremia - TPN per pharmacy. Improving, supplemented 3/30 with D5W.  Family Communication: updated son on phone  Disposition Plan: [name] vs [facility]          Procedures/Studies: Ct Abdomen Pelvis W Contrast  04/04/2013   CLINICAL DATA:  Liver flight non.  Leukocytosis.  EXAM: CT ABDOMEN AND PELVIS WITH CONTRAST  TECHNIQUE: Multidetector  CT imaging of the abdomen and pelvis was performed using the standard protocol following bolus administration of intravenous contrast.  CONTRAST:  90mL OMNIPAQUE IOHEXOL 300 MG/ML  SOLN   COMPARISON:  DG ABD PORTABLE 1V dated 04/04/2013; CT ABD/PELVIS W CM dated 03/28/2013; CT ABD-PELV W/O CM dated 03/11/2013; CT ABD/PELVIS W CM dated 01/30/2013  FINDINGS: Stable moderate left and small right pleural effusions with associated passive atelectasis. Mild cardiomegaly. Postoperative findings along the gastroesophageal junction.  Slightly reduce marginal definition of the infiltrative hypodense process involving the right hepatic lobe and portions of segment 4 of the liver.  The spleen, adrenal glands, and pancreas unremarkable. Small amount of fluid along the gallbladder fossa and along the inferior edge of the right hepatic lobe could, similar to prior. Gallbladder surgically absent.  There is contrast medium in the renal collecting systems on the initial portal venous phase images, likely due to inadvertent early injection.  Stable renal hypodensities. Infrarenal IVC filter. Continued fluid along the inferior margin of the laparotomy wound. Trace fluid in the lower omentum. Stable trace presacral edema.  Bilateral chronic pars defects at L5 observed with 9 mm of anterolisthesis.  Orally administered contrast extends through to the rectum. Mild circumferential rectal wall thickening.  The patient seems to have had right hemicolectomy, with reanastomosis to the small bowel on image 39 of series 5. Outpouching from the proximal terminal margin of the remaining colon shown on images 32 through 21 of series 5, potentially some type of postoperative diverticulum or contained leak along the stable site.  IMPRESSION: 1. Similar distribution but reduced conspicuity of the peripheral infiltrative hepatic hypodensity affecting the right hepatic lobe an adjacent portion of segment 4. Fatty infiltration is the most common cause for and infiltrative hypodensity of this type, although localized parenchymal inflammation is not entirely excluded. MRI could differentiate if clinically warranted. 2. Small amount of  perihepatic ascites, similar to prior. 3. Hypodense renal lesions, similar to prior. 4. Patient appears to have had right hemicolectomy with small bowel reanastomosis. There is a collection of gas and contrast extending to the right of the proximal terminal margin of the remaining colon, potentially a small contained leak along the staple line. 5. Continued fluid along the inferior margin of the laparotomy wound, but without internal gas density currently.   Electronically Signed   By: Herbie Baltimore M.D.   On: 04/04/2013 18:40   Ct Abdomen Pelvis W Contrast  03/28/2013   CLINICAL DATA:  Abdominal distention.  EXAM: CT ABDOMEN AND PELVIS WITH CONTRAST  TECHNIQUE: Multidetector CT imaging of the abdomen and pelvis was performed using the standard protocol following bolus administration of intravenous contrast.  CONTRAST:  OMNIPAQUE IOHEXOL 300 MG/ML  SOLN  COMPARISON:  DG ABD ACUTE W/CHEST dated 03/28/2013; CT ABD-PELV W/O CM dated 03/11/2013; CT ABD/PELVIS W CM dated 01/30/2013  FINDINGS: There is a new lucency is again noted in the liver. This remains unchanged in appearance and may be infectious in etiology. No clearcut fluid collection in or air collection is noted in the liver to suggest a definite abscess. These changes may be related to phlegmon. Close follow up to evaluate for developing abscess suggested. Hepatic veins patent. Splenic and portal veins patent. No focal significant splenic abnormality. Pancreas normal. Cholecystectomy. No biliary distention.  Adrenals normal. Stable approximate 13 mm low-density lesion in the left kidney, not definite simple cyst. As noted on prior study, MRI of the kidneys suggest for further evaluation. No hydronephrosis. The bladder is nondistended.  No free pelvic fluid. Prostate is not enlarged. Calcifications within the prostate. No significant adenopathy. Abdominal aorta is atherosclerotic. No aneurysm. Visceral vessels are patent. Inferior vena caval filter  noted in the IVC with tip just below the renal veins.  Previously identified drainage catheter in the right upper quadrant has been removed. Inflammatory changes are noted in the right mid abdomen, no abscess noted. The patient has had a prior hemicolectomy. Reference is made to CT report of 01/31/2012 which discusses fistula changes within the abdomen. Patient status post right hemicolectomy. The colon is moderately distended and contains fluid levels. These changes have improved from prior exam suggesting improving ileus. These changes may be related to a diarrheal illness. Follow-up abdominal series suggested. No small bowel distention. The stomach is nondistended. No free air noted.  Atelectasis and/or infiltrates in the lung bases. Bilateral pleural effusions. Cardiomegaly. Coronary artery disease. Midline abdominal scar noted. Fluid noted in the region of the scar with associated small amount of air. Developing abscess should be considered. These findings have progressed slightly from prior study. Multiple anterior abdominal wall subcutaneous nodules most likely injection granulomas.  IMPRESSION: 1. Findings consistent with persistent phlegmon in the liver. 2. Interim removal of right upper quadrant drainage catheter. 3. Colonic distention with air-fluid levels. These findings have improved from prior study suggesting improving adynamic ileus or diarrheal illness. Followup abdominal series suggested. 4. Slight progression of soft tissue fluid in the anterior abdomen in the region the patient's scarring. This could represent developing phlegmon/abscess. Small amount of air is noted within this fluid collection. 5. 13 mm low-density lesion left kidney, not definite simple cyst. As noted on prior study nonenhanced and enhanced MRI of the kidneys suggested for further evaluation.   Electronically Signed   By: Maisie Fus  Register   On: 03/28/2013 23:29   Dg Chest Port 1 View  04/10/2013   CLINICAL DATA:  Cough and  congestion.  EXAM: PORTABLE CHEST - 1 VIEW  COMPARISON:  DG CHEST 1V PORT dated 04/03/2013  FINDINGS: Tracheostomy and left-sided PICC line positioning are stable. The right jugular central line has been removed since the prior chest x-ray. Lungs show lower volumes bilaterally with increased prominence of bilateral lower lobe atelectasis. There may be a component of left-sided pleural fluid. The heart remains moderately enlarged with evidence of prior placement of a left atrial appendage clip. No overt pulmonary edema is identified.  IMPRESSION: Lower volumes with increased prominence of bilateral lower lobe atelectasis. There may be a component of left-sided pleural fluid.   Electronically Signed   By: Irish Lack M.D.   On: 04/10/2013 11:29   Dg Chest Port 1 View  04/03/2013   CLINICAL DATA:  Central venous catheter placement  EXAM: PORTABLE CHEST - 1 VIEW  COMPARISON:  DG CHEST 1V PORT dated 04/01/2013  FINDINGS: Left upper extremity PICC is been placed. Tip is in the lower SVC. Tracheostomy tube and left atrial appendage clip stable. Left pleural effusion and basilar consolidation stable. Vascular congestion increased.  IMPRESSION: Left PICC placed with its tip at the lower SVC.  Increased vascular congestion.  Stable left basilar consolidation and left pleural effusion.   Electronically Signed   By: Maryclare Bean M.D.   On: 04/03/2013 09:04   Dg Chest Port 1 View  04/01/2013   CLINICAL DATA:  Leukocytosis  EXAM: PORTABLE CHEST - 1 VIEW  COMPARISON:  03/28/2013  FINDINGS: Postsurgical changes are again seen. A right-sided jugular central line is noted in the right  innominate vein stable from the prior exam. The cardiac shadow is stable. Left basilar consolidation with increasing effusion is noted.  IMPRESSION: Increasing left basilar infiltrate and effusion.   Electronically Signed   By: Alcide Clever M.D.   On: 04/01/2013 09:10   Dg Abd 2 Views  03/30/2013   CLINICAL DATA:  Colonic distention.  Multiple  abdominal surgeries.  EXAM: ABDOMEN - 2 VIEW  COMPARISON:  DG ABD 2 VIEWS dated 03/29/2013; CT ABD/PELVIS W CM dated 03/28/2013  FINDINGS: Trace blunting of the right costophrenic angle. No free intraperitoneal gas.  Dilated loops of left upper quadrant small bowel measure up to 4 cm, similar to the prior exam. There are several air-fluid levels within these small bowel loops. Reduced colonic gas compared to the prior exam. IVC filter noted.  IMPRESSION: 1. Mildly dilated loops of left upper quadrant small bowel with scattered internal air-fluid levels, similar to the prior exam, with abnormal but nonspecific pattern which could be due to could ileus or partial small bowel obstruction. Correlate with bowel sounds and bowel function.   Electronically Signed   By: Herbie Baltimore M.D.   On: 03/30/2013 10:43   Dg Abd 2 Views  03/29/2013   CLINICAL DATA:  Lower abdominal pain. Nausea. Shortness of breath. Weakness.  EXAM: ABDOMEN - 2 VIEW  COMPARISON:  Abdominal radiograph 03/28/2013.  FINDINGS: Some colonic gas is noted. However, there are multiple borderline dilated of mildly dilated loops of gas-filled small bowel measuring up to 4.2 cm in diameter throughout the central abdomen and left side of the abdomen. No pneumoperitoneum appreciated on the left lateral decubitus view. Several small air-fluid levels are noted. Residual iodinated contrast material is noted within the lumen of the urinary bladder related to yesterday's CT examination. Numerous surgical clips are noted in the right upper quadrant of the abdomen. IVC filter projecting over either the right side at L2-L3.  IMPRESSION: 1. Nonspecific bowel gas pattern, as above, suggestive of partial small bowel obstruction. 2. No pneumoperitoneum.   Electronically Signed   By: Trudie Reed M.D.   On: 03/29/2013 13:38   Dg Abd Acute W/chest  03/28/2013   CLINICAL DATA:  Evaluate for a bowel obstruction.  EXAM: ACUTE ABDOMEN SERIES (ABDOMEN 2 VIEW & CHEST 1  VIEW)  COMPARISON:  None.  FINDINGS: Tracheostomy tube tip is above the carina. Heart size is mildly enlarged. The lung volumes are low. There is asymmetric elevation of the left hemidiaphragm. Left pleural effusion is noted.  Patient has an IVC filter. There are persistent abnormally dilated loops of small bowel which measure up to 5.5 cm. Gas is noted within the colon and rectum. There is no evidence of dilated bowel loops or free intraperitoneal air. No radiopaque calculi or other significant radiographic abnormality is seen. Heart size and mediastinal contours are within normal limits. Both lungs are clear.  IMPRESSION: 1. Persistent small bowel dilatation compatible with either partial small bowel obstruction or ileus. 2. No change in aeration to the lungs compared with previous exam   Electronically Signed   By: Signa Kell M.D.   On: 03/28/2013 18:56   Dg Abd Portable 1v  04/07/2013   CLINICAL DATA:  Abdominal pain.  EXAM: PORTABLE ABDOMEN - 1 VIEW  COMPARISON:  None.  FINDINGS: The bowel gas pattern is normal. IVC filter seen at the level of L3. Surgical clips from prior cholecystectomy noted. No radio-opaque calculi or other significant radiographic abnormality are seen.  IMPRESSION: No acute findings.  Unremarkable bowel gas pattern.   Electronically Signed   By: Myles Rosenthal M.D.   On: 04/07/2013 15:26   Dg Abd Portable 1v  04/04/2013   CLINICAL DATA:  Evaluate barium  EXAM: PORTABLE ABDOMEN - 1 VIEW  COMPARISON:  03/30/2013  FINDINGS: Contrast material is noted within the transverse colon and splenic flexure of the colon. Small bowel loop distention has improved. Slight prominence of left abdominal small bowel loops persists. No free air. No organomegaly. Prior cholecystectomy.  IMPRESSION: Improving small bowel distention. Small amount of oral contrast material within the transverse colon and splenic flexure of the colon.   Electronically Signed   By: Charlett Nose M.D.   On: 04/04/2013 10:18    Dg Swallowing Func-speech Pathology  04/11/2013   Riley Nearing Deblois, CCC-SLP     04/11/2013  3:10 PM Objective Swallowing Evaluation: Modified Barium Swallowing Study   Patient Details  Name: Tim Short MRN: [id number] Date of Birth: 1934/03/20  Today's Date: 04/11/2013 Time: 4098-1191 SLP Time Calculation (min): 25 min  Past Medical History:  Past Medical History  Diagnosis Date  . Hypertension   . Hypercholesteremia   . Asthma   . Meniere disease   . Persistent atrial fibrillation     a. s/p multiple dccv's;  b. failed amio;  c. not felt to be AF  RFCA canddiate due to LA dil;  d. s/p failed hybrid ablation @  [facility] in 09/2012;  e. chronic pradaxa.  . Obesity   . Biatrial enlargement     LA size 5.3cm  . Obstructive sleep apnea     AHI 108/hr now on CPAP at 12cm H2O  . H/O hiatal hernia   . GERD (gastroesophageal reflux disease)     "associated w/hiatal hernia" (04-Jul-2012)  . Peptic ulcer 1950's  . Migraines     "haven't had one for about 15 years" (07-04-2012)  . Arthritis     "joints" (07/04/12)  . Chronic lower back pain   . Nephrolithiasis ~ 1960's    "passed on their own" (04-Jul-2012)  . History of pneumonia 2002; 2006    "spent 8 days in isolation; Norovirus" (Jul 04, 2012)  . Other and unspecified angina pectoris   . RLS (restless legs syndrome)   . Coronary artery disease     a. 05/2012 Cath/PCI: LM nl, LAD nl, LCX 21m (3.0x15 Integrity  BMS), PTCA of OM1 through stent struts (kissing balloon).  . Diabetes mellitus without complication     FAMILY STATES PATIENT IS NOT DIABETIC   Past Surgical History:  Past Surgical History  Procedure Laterality Date  . Wrist fracture surgery  1992    "repaired w/left hip bone graft" (07/04/2012)  . Total knee arthroplasty  08/19/1999  . Colonoscopy w/ polypectomy  2006  . Knee arthroscopy with meniscal repair Right 1974    "medial meniscus repaired" (07/04/2012)  . Cardioversion  10/02/2011    Procedure: CARDIOVERSION;  Surgeon: Corky Crafts, MD;   Location: [facility]  ENDOSCOPY;  Service: Cardiovascular;  Laterality:  N/A;  h/p in file drawer  . Cardioversion  11/07/2011    Procedure: CARDIOVERSION;  Surgeon: Corky Crafts, MD;   Location: [facility] ENDOSCOPY;  Service: Cardiovascular;  Laterality:  N/A;  h/p from 10/22 in file drawer/dl  . Cardiac catheterization  05/26/2012  . Coronary angioplasty with stent placement  July 04, 2012    "1" (2012-07-04)  . Cataract extraction w/ intraocular lens  implant, bilateral   2009  . Hemorrhoid surgery  ~ 2003  .  Hiatal hernia repair  1983  . Nissen fundoplication  1983  . Ablation of dysrhythmic focus  SEPT/OCT 2014    ATRIAL FIB  . Inguinal hernia repair Bilateral 2000 and 2005    "one done at a time" (06/04/2012)  . Colon surgery  10/08/2012   . 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 Pt was in  prolonged respiratory failure at [facility], unable to wean of the vent  and requiring trach placement 10/29/2012. He was transferred to  [facility] in December 2014 and later to [facility] in late January  2015. He was on TNA and was doing fairly well initially, but  developed more abdominal pain and on 2/23 taken to OR in [place]  for gallbladder removal (secondary to cholecystitis), resection  for part of the colon for fistula, IVC filter placement. Sent  back to [facility] on march 4th, 2015 and started on TPN. Diet  advanced on March 9th, 2015 after pt passed swallow evaluation.  He was brought to [facility] March 16th, after noticing progressive  abdominal distension. In ED, CT abdomen with air- fluid level and  findings consistent with early partial SBO, ileus. Surgery  reports pt is ok for PO from their standpoint, SLP ordered for  swallow eval.      Assessment / Plan /  Recommendation Clinical Impression  Dysphagia Diagnosis: Moderate pharyngeal phase dysphagia Clinical impression: Pt demonstrates significant improvement in  motor function since last MBS. Pts primary decifits are now  exclusively sensory in nature. Pt has a delay in swallow  initiation with nectar and thin teaspoon amounts with aspiration  before the swallow. With a chin tuck, the pt is albe to protect  the airway before the swallow with nectar vea teaspoon or straw.  Pt is also able to masticate soft and regular textured solids. He  was noted to have expectoration of mucous/barium after the test,  possible esophageal component not visualized due to body habitus.  Pt is recommended to upgrade to a dys 2 Finely chopped diet with  nectar thick liquids via spoon or straws with full supervison for  a chin tuck and placement of PMSV. Pt may upgrade solids if he  tolerates dys 2 diet.     Treatment Recommendation  Therapy as outlined in treatment plan below    Diet Recommendation Dysphagia 2 (Fine chop);Nectar-thick liquid   Liquid Administration via: Spoon;Straw Medication Administration: Crushed with puree Supervision: Patient able to self feed;Full supervision/cueing  for compensatory strategies Compensations: Slow rate;Small sips/bites Postural Changes and/or Swallow Maneuvers: Chin tuck;Seated  upright 90 degrees    Other  Recommendations Oral Care Recommendations: Oral care BID Other Recommendations: Place PMSV during PO intake;Order  thickener from pharmacy;Have oral suction available   Follow Up Recommendations  Skilled Nursing facility    Frequency and Duration min 2x/week  2 weeks   Pertinent Vitals/Pain NA    SLP Swallow Goals     General HPI: Pt is 79 yo male with fairly recent hospitalization  at [facility] in September 2014 for failed maize procedure and during  that hospitalization pt developed volvulus and has required right  colectomy (10/08/2012), subsequently developed multiple abscesses  and enteric fistulas  managed percutaneous drains. Pt was in  prolonged respiratory  failure at [facility], unable to wean of the vent  and requiring trach placement 10/29/2012. He was transferred to  [facility] in December 2014 and later to [facility] in late January  2015. He was on TNA and was doing fairly well initially, but  developed more abdominal pain and on 2/23 taken to OR in [place]  for gallbladder removal (secondary to cholecystitis), resection  for part of the colon for fistula, IVC filter placement. Sent  back to [facility] on march 4th, 2015 and started on TPN. Diet  advanced on March 9th, 2015 after pt passed swallow evaluation.  He was brought to [facility] March 16th, after noticing progressive  abdominal distension. In ED, CT abdomen with air- fluid level and  findings consistent with early partial SBO, ileus. Surgery  reports pt is ok for PO from their standpoint, SLP ordered for  swallow eval.  Type of Study: Modified Barium Swallowing Study Reason for Referral: Objectively evaluate swallowing function Previous Swallow Assessment: [facility] hospital - 3/16 pt "passed"  FEES and was started on diet, began to decline and blue dye test  was given, pt silently aspirated thin, nectar, honey per report.  Diet Prior to this Study: Dysphagia 1 (puree);Pudding-thick  liquids Temperature Spikes Noted: No Respiratory Status: Trach collar Trach Size and Type: Cuff;Deflated;With PMSV in place;#6;Other  (Comment) History of Recent Intubation: No Behavior/Cognition: Alert;Cooperative;Requires cueing;Hard of  hearing Oral Cavity - Dentition: Missing dentition Oral Motor / Sensory Function: Impaired - see Bedside swallow  eval Self-Feeding Abilities: Able to feed self;Needs assist Patient Positioning: Upright in chair Baseline Vocal Quality: Low vocal intensity Volitional Cough: Strong Volitional Swallow: Able to elicit Anatomy: Within functional limits Pharyngeal Secretions: Not observed secondary MBS    Reason for Referral Objectively evaluate swallowing  function   Oral Phase Oral Preparation/Oral Phase Oral Phase: Impaired Oral Phase - Comment Oral Phase - Comment: missing dentition, but places boluse  effectively between remaining teeth. otherwise WFL.    Pharyngeal Phase Pharyngeal Phase Pharyngeal Phase: Impaired Pharyngeal - Honey Pharyngeal - Honey Teaspoon: Not tested Pharyngeal - Honey Cup: Not tested Pharyngeal - Nectar Pharyngeal - Nectar Teaspoon: Delayed swallow  initiation;Penetration/Aspiration before swallow (chin tuck  increases airway closure, preents penetration) Penetration/Aspiration details (nectar teaspoon): Material enters  airway, passes BELOW cords without attempt by patient to eject  out (silent aspiration);Material does not enter airway Pharyngeal - Nectar Straw: Delayed swallow initiation (chin tuck) Pharyngeal - Thin Pharyngeal - Thin Teaspoon: Delayed swallow  initiation;Penetration/Aspiration before swallow (chin tuck) Penetration/Aspiration details (thin teaspoon): Material enters  airway, CONTACTS cords and not ejected out Pharyngeal - Solids Pharyngeal - Puree: Delayed swallow initiation (chin tuck) Pharyngeal - Mechanical Soft: Delayed swallow initiation Pharyngeal - Regular: Delayed swallow initiation  Cervical Esophageal Phase    GO             Harlon Ditty, MA CCC-SLP [phone]  Claudine Mouton 04/11/2013, 3:08 PM    Dg Swallowing Func-speech Pathology  04/02/2013   Breck Coons [place], CCC-SLP     04/02/2013  2:03 PM Objective Swallowing Evaluation: Modified Barium Swallowing Study   Patient Details  Name: MARICUS TANZI MRN: [id number] Date of Birth: 03/17/1934  Today's Date: 04/02/2013 Time: 1225-1250 SLP Time Calculation (min): 25 min  Past Medical History:  Past Medical History  Diagnosis Date  . Hypertension   . Hypercholesteremia   . Asthma   . Meniere disease   . Persistent atrial fibrillation     a. s/p multiple dccv's;  b. failed amio;  c. not felt to be AF  RFCA canddiate due to LA dil;  d. s/p failed hybrid  ablation @  [facility] in 09/2012;  e. chronic pradaxa.  . Obesity   . Biatrial enlargement     LA size 5.3cm  . Obstructive sleep apnea     AHI 108/hr now on CPAP at 12cm H2O  . H/O hiatal hernia   . GERD (gastroesophageal reflux disease)     "associated w/hiatal hernia" (2012/06/21)  . Peptic ulcer 1950's  . Migraines     "haven't had one for about 15 years" (2012/06/21)  . Arthritis     "joints" (06-21-12)  . Chronic lower back pain   . Nephrolithiasis ~ 1960's    "passed on their own" (06/21/12)  . History of pneumonia 2002; 2006    "spent 8 days in isolation; Norovirus" (21-Jun-2012)  . Other and unspecified angina pectoris   . RLS (restless legs syndrome)   . Coronary artery disease     a. 05/2012 Cath/PCI: LM nl, LAD nl, LCX 42m (3.0x15 Integrity  BMS), PTCA of OM1 through stent struts (kissing balloon).  . Diabetes mellitus without complication     FAMILY STATES PATIENT IS NOT DIABETIC   Past Surgical History:  Past Surgical History  Procedure Laterality Date  . Wrist fracture surgery  1992    "repaired w/left hip bone graft" (21-Jun-2012)  . Total knee arthroplasty  08/19/1999  . Colonoscopy w/ polypectomy  2006  . Knee arthroscopy with meniscal repair Right 1974    "medial meniscus repaired" (June 21, 2012)  . Cardioversion  10/02/2011    Procedure: CARDIOVERSION;  Surgeon: Corky Crafts, MD;   Location: [facility] ENDOSCOPY;  Service: Cardiovascular;  Laterality:  N/A;  h/p in file drawer  . Cardioversion  11/07/2011    Procedure: CARDIOVERSION;  Surgeon: Corky Crafts, MD;   Location: [facility] ENDOSCOPY;  Service: Cardiovascular;  Laterality:  N/A;  h/p from 10/22 in file drawer/dl  . Cardiac catheterization  05/26/2012  . Coronary angioplasty with stent placement  Jun 21, 2012    "1" (06/21/2012)  . Cataract extraction w/ intraocular lens  implant, bilateral   2009  . Hemorrhoid surgery  ~ 2003  . Hiatal hernia repair  1983  . Nissen fundoplication  1983  . Ablation of dysrhythmic focus  SEPT/OCT 2014    ATRIAL FIB  . Inguinal  hernia repair Bilateral 2000 and 2005    "one done at a time" (June 21, 2012)  . Colon surgery  10/08/2012   . 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 Pt was in  prolonged respiratory failure at [facility], unable to wean of the vent  and requiring trach placement 10/29/2012. He was transferred to  [facility] in December 2014 and later to [facility] in late January  2015. He was on TNA and was doing fairly well initially, but  developed more abdominal pain and on 2/23 taken to OR in [place]  for gallbladder removal (secondary to cholecystitis), resection  for part of the colon for fistula, IVC filter placement. Sent  back to [facility] on march 4th, 2015 and started on TPN. Diet  advanced on March 9th, 2015 after pt passed swallow evaluation.  He was brought to [facility] March 16th, after noticing  progressive  abdominal distension. In ED, CT abdomen with air- fluid level and  findings consistent with early partial SBO, ileus. Surgery  reports pt is ok for PO from their standpoint, SLP ordered for  swallow eval.      Assessment / Plan / Recommendation Clinical Impression  Dysphagia Diagnosis: Moderate pharyngeal phase dysphagia;Severe  pharyngeal phase dysphagia Clinical impression: MBS completed with pt's PMSV donned and  appears to be congruent with study performed at [facility].  He  demonstrated moderate-severe motor based pharyngeal dyspahgia as  evidenced by reduced tongue base retraction, decreased laryngeal  elevation and decreased pharyngeal contraction.  Impairments led  to mild pharyngeal residue in vallecule/pyriform sinuses and  posterior pharyngeal wall and laryngeal vestibule consistently  silent penetration to vocal cords.  Verbal cues for hard  cough  briefly cleared vestibule with subsequent penetration from  residue.  SLP recommends Dys 1 (puree) only, no liquids (pudding  thick liquid) with speaking valve donned, two swallows,  volitional coughs, pills crushed in applesauce and full  supervision/assist.       Treatment Recommendation  Therapy as outlined in treatment plan below    Diet Recommendation Dysphagia 1 (Puree);Pudding-thick liquid   Medication Administration: Crushed with puree Supervision: Patient able to self feed;Full supervision/cueing  for compensatory strategies Compensations: Slow rate;Small sips/bites;Multiple dry swallows  after each bite/sip;Hard cough after swallow Postural Changes and/or Swallow Maneuvers: Seated upright 90  degrees;Upright 30-60 min after meal, wear speaking valve during  all po's    Other  Recommendations Oral Care Recommendations: Oral care BID Other Recommendations: Order thickener from pharmacy   Follow Up Recommendations  Skilled Nursing facility    Frequency and Duration min 2x/week  2 weeks   Pertinent Vitals/Pain WDL            Reason for Referral Objectively evaluate swallowing function   Oral Phase Oral Preparation/Oral Phase Oral Phase: WFL (WFL for textures assessed)   Pharyngeal Phase Pharyngeal Phase Pharyngeal Phase: Impaired Pharyngeal - Honey Pharyngeal - Honey Teaspoon: Reduced pharyngeal  peristalsis;Pharyngeal residue - valleculae;Reduced tongue base  retraction;Penetration/Aspiration during swallow;Pharyngeal  residue - pyriform sinuses;Reduced laryngeal elevation Penetration/Aspiration details (honey teaspoon): Material enters  airway, CONTACTS cords and not ejected out Pharyngeal - Honey Cup: Penetration/Aspiration during  swallow;Pharyngeal residue - valleculae;Pharyngeal residue -  pyriform sinuses;Reduced tongue base retraction;Reduced laryngeal  elevation Penetration/Aspiration details (honey cup): Material enters  airway, remains ABOVE vocal cords then ejected out Pharyngeal - Solids  Pharyngeal - Puree: Pharyngeal residue - valleculae;Reduced  tongue base retraction;Pharyngeal residue - pyriform  sinuses;Reduced laryngeal elevation  Cervical Esophageal Phase        Cervical Esophageal Phase Cervical Esophageal Phase: [facility]         Darrow Bussing.Ed CCC-SLP Pager [phone]  04/02/2013         Subjective: Patient still has very poor by mouth intake. He denies any nausea, vomiting. He has chronic abdominal pain which is worse. Denies any chest discomfort, but complains of chronic shortness breath which is unchanged. No headaches, fevers, chills, dizziness.  Objective: Filed Vitals:   04/14/13 0409 04/14/13 0526 04/14/13 0842 04/14/13 1247  BP: 110/67     Pulse: 105 133 134 126  Temp: 98.1 F (36.7 C)     TempSrc: Oral     Resp: 20 20 20 20   Height:      Weight: 88.905 kg (196 lb)     SpO2: 98% 95% 96% 95%    Intake/Output Summary (Last 24 hours) at 04/14/13  1521 Last data filed at 04/14/13 0730  Gross per 24 hour  Intake    240 ml  Output    350 ml  Net   -110 ml   Weight change:  Exam:   General:  Pt is alert, follows commands appropriately, not in acute distress  HEENT: No icterus, No thrush,  [place]/AT; tracheostomy in place  Cardiovascular: IRRR, S1/S2, no rubs, no gallops  Respiratory: Scattered bilateral rales. No wheezing.  Abdomen: Soft/+BS, non tender, non distended, no guarding  Extremities: No edema, No lymphangitis, No petechiae, No rashes, no synovitis  Data Reviewed: Basic Metabolic Panel:  Recent Labs Lab 04/08/13 0500 04/09/13 0416 04/10/13 0530 04/11/13 0456 04/11/13 1745 04/12/13 0525 04/13/13 0605 04/14/13 0530  NA 155* 155* 151* 152* 151* 149* 148* 148*  K 3.0* 3.3* 3.4* 3.6* 3.6* 3.3* 3.3* 3.6*  CL 118* 116* 114* 114* 113* 111 110 109  CO2 28 27 26 26 27 29 27 28   GLUCOSE 161* 188* 206* 221* 220* 183* 181* 168*  BUN 13 19 25* 25* 25* 24* 21 19  CREATININE 0.76 0.73 0.69 0.57 0.60 0.59 0.59 0.57  CALCIUM 8.6 8.9  8.7 8.8 8.8 8.8 8.5 8.6  MG 2.1 2.2 2.2 2.2  --   --   --  2.1  PHOS 2.1* 2.9 2.8 3.1  --   --   --  3.5   Liver Function Tests:  Recent Labs Lab 04/09/13 0416 04/10/13 0530 04/11/13 0456 04/12/13 0525 04/14/13 0530  AST 32 27 19 20  79*  ALT 42 38 33 27 69*  ALKPHOS 68 63 68 64 76  BILITOT 0.3 0.3 0.4 0.4 0.3  PROT 5.1* 4.9* 5.1* 4.9* 5.0*  ALBUMIN 2.4* 2.3* 2.3* 2.1* 2.2*   No results found for this basename: LIPASE, AMYLASE,  in the last 168 hours No results found for this basename: AMMONIA,  in the last 168 hours CBC:  Recent Labs Lab 04/08/13 0500 04/09/13 0416 04/10/13 0530 04/11/13 0456 04/12/13 0525 04/13/13 0605  WBC 8.8 11.2* 9.9 10.3 8.9 7.0  NEUTROABS 6.9  --   --  8.6*  --   --   HGB 8.6* 8.5* 8.4* 8.7* 8.5* 8.5*  HCT 27.9* 28.1* 27.7* 28.5* 28.4* 27.6*  MCV 98.9 98.6 98.9 98.6 99.3 96.8  PLT 130* 125* 114* 114* 100* 105*   Cardiac Enzymes: No results found for this basename: CKTOTAL, CKMB, CKMBINDEX, TROPONINI,  in the last 168 hours BNP: No components found with this basename: POCBNP,  CBG:  Recent Labs Lab 04/13/13 1106 04/13/13 1645 04/13/13 2145 04/14/13 0626 04/14/13 1147  GLUCAP 177* 167* 156* 131* 186*    Recent Results (from the past 240 hour(s))  CLOSTRIDIUM DIFFICILE BY PCR     Status: None   Collection Time    04/10/13  9:03 AM      Result Value Ref Range Status   C difficile by pcr NEGATIVE  NEGATIVE Final  URINE CULTURE     Status: None   Collection Time    04/13/13  2:31 PM      Result Value Ref Range Status   Specimen Description URINE, RANDOM   Final   Special Requests NONE   Final   Culture  Setup Time     Final   Value: 04/13/2013 18:52     Performed at [facility] Count     Final   Value: 8,000 COLONIES/ML     Performed at [facility]  Culture     Final   Value: INSIGNIFICANT GROWTH     [facility]   Report Status 04/14/2013 FINAL   Final     Scheduled  Meds: . amiodarone  200 mg Oral BID  . atorvastatin  40 mg Oral Daily  . chlorhexidine  15 mL Mouth/Throat BID  . diltiazem  240 mg Oral BID  . feeding supplement (ENSURE)  1 Container Oral TID BM  . hydrocortisone cream   Topical BID  . insulin aspart  0-9 Units Subcutaneous TID WC  . pantoprazole  40 mg Oral Daily  . predniSONE  5 mg Oral Q breakfast  . rivaroxaban  20 mg Oral Q supper  . roflumilast  500 mcg Oral Daily  . sodium chloride  10-40 mL Intracatheter Q12H  . sodium chloride  3 mL Intravenous Q12H  . sodium chloride  3 mL Intravenous Q12H  . sodium chloride  3 mL Intravenous Q12H   Continuous Infusions: . sodium chloride    . sodium chloride    . dextrose 5 % and 0.45 % NaCl with KCl 20 mEq/L 50 mL/hr at 04/14/13 0858  . Marland Kitchen.TPN (CLINIMIX-E) Adult     And  . fat emulsion    . Marland Kitchen.TPN (CLINIMIX-E) Adult 60 mL/hr at 04/13/13 1729   And  . fat emulsion 250 mL (04/13/13 1729)     Astaria Nanez, DO  [facility] Pager [phone]  If 7PM-7AM, please contact night-coverage [email or web address] Password [email or web address] 04/14/2013, 3:21 PM   LOS: 17 days

## 2013-04-14 NOTE — Progress Notes (Signed)
CSW continuing to follow and assist with discharge plans as needed.  Maree KrabbeLindsay Ernisha Sorn, MSW, Theresia MajorsLCSWA [phone]

## 2013-04-14 NOTE — Progress Notes (Signed)
Occupational Therapy Treatment Patient Details Name: Tim Short MRN: [id number] DOB: 1934-02-09 Today's Date: 04/14/2013    History of present illness 79 yo male with recent complications at [facility] after an ablation for afib in oct 2014 he developed a volvulus after the ablation was intubated/trachedm required multiple abd surgeries then developed fistula details unknown.  Has been on tpn, with ngt on/off since. Is at [facility] sent here for complaints of abd pain.  Pt was taken off tpn and feeding tube was removed last week.  He has been eating by mouth and doing well.  But last 4 days with worsening abd pain, no n/v/d.  No fevers (however is was started on vanc and zosyn for unknown reasons).  Pt na level is elevated, and appears dehydrated. 04/08/13 Pt now reports that he is having vertigo which has ebbed and flowed for the  past 14 years.   OT comments  This 79 yo actually making progress today, no c/o dizziness just very weak from not being able to participate in therapies due to dizziness up until this point every time we attempted to get up. Pt very willing to work with me today and to get up to recliner.  Follow Up Recommendations  LTACH    Equipment Recommendations   (TBD)       Precautions / Restrictions Precautions Precautions: Fall Precaution Comments: has had vertigo every day UNITL today when getting up to EOB, VERY HOH Restrictions Weight Bearing Restrictions: No Other Position/Activity Restrictions: Got dizzy sitting up EOB with some room spinning       Mobility Bed Mobility Overal bed mobility: Needs Assistance Bed Mobility: Rolling Rolling: Supervision   Supine to sit: Mod assist      Transfers Overall transfer level: Needs assistance Equipment used: Rolling walker (2 wheeled) Transfers: Sit to/from [employer] Sit to Stand: From elevated surface;Mod assist;+2 physical assistance   Squat pivot transfers: Max assist;+2 physical assistance;From  elevated surface     General transfer comment: Pt not able to come to full stand (only about 1/2 way)    Balance Overall balance assessment: Needs assistance Sitting-balance support: Feet supported;Single extremity supported Sitting balance-[name] Scale: Poor                             ADL                           Toilet Transfer: Moderate assistance;+2 for physical assistance;+2 for safety/equipment (bed raised to recliner going to pt's left)                              Cognition   Behavior During Therapy: [facility] for tasks assessed/performed Overall Cognitive Status: Within Functional Limits for tasks assessed                                            Frequency Min 2X/week     Progress Toward Goals  OT Goals(current goals can now be found in the care plan section)  Progress towards OT goals: Progressing toward goals     Plan Discharge plan remains appropriate       End of Session Equipment Utilized During Treatment: Gait belt (and pad under him)   Activity Tolerance  Patient tolerated treatment well   Patient Left in chair;with call bell/phone within reach   Nurse Communication  (PT to help get pt back to bed)        Time: 4540-98111424-1448 OT Time Calculation (min): 24 min  Charges: OT General Charges $OT Visit: 1 Procedure OT Treatments $Self Care/Home Management : 23-37 mins  Evette GeorgesLeonard, Michelle Wnek Eva [phone] 04/14/2013, 4:48 PM

## 2013-04-15 LAB — BASIC METABOLIC PANEL
BUN: 16 mg/dL (ref 6–23)
CHLORIDE: 105 meq/L (ref 96–112)
CO2: 27 meq/L (ref 19–32)
Calcium: 8.3 mg/dL — ABNORMAL LOW (ref 8.4–10.5)
Creatinine, Ser: 0.59 mg/dL (ref 0.50–1.35)
GFR calc Af Amer: >90 mL/min (ref >90)
GFR calc non Af Amer: >90 mL/min (ref >90)
Glucose, Bld: 158 mg/dL — ABNORMAL HIGH (ref 70–99)
Potassium: 3.3 mEq/L — ABNORMAL LOW (ref 3.7–5.3)
SODIUM: 144 meq/L (ref 137–147)

## 2013-04-15 LAB — CBC WITH DIFFERENTIAL/PLATELET
BASOS ABS: 0 10*3/uL (ref 0.0–0.1)
Basophils Relative: 0 % (ref 0–1)
Eosinophils Absolute: 0.1 10*3/uL (ref 0.0–0.7)
Eosinophils Relative: 1 % (ref 0–5)
HCT: 28 % — ABNORMAL LOW (ref 39.0–52.0)
Hemoglobin: 8.6 g/dL — ABNORMAL LOW (ref 13.0–17.0)
LYMPHS ABS: 1.3 10*3/uL (ref 0.7–4.0)
Lymphocytes Relative: 17 % (ref 12–46)
MCH: 29.8 pg (ref 26.0–34.0)
MCHC: 30.7 g/dL (ref 30.0–36.0)
MCV: 96.9 fL (ref 78.0–100.0)
Monocytes Absolute: 0.6 10*3/uL (ref 0.1–1.0)
Monocytes Relative: 8 % (ref 3–12)
NEUTROS ABS: 5.5 10*3/uL (ref 1.7–7.7)
NEUTROS PCT: 74 % (ref 43–77)
Platelets: 108 10*3/uL — ABNORMAL LOW (ref 150–400)
RBC: 2.89 MIL/uL — AB (ref 4.22–5.81)
RDW: 20.6 % — ABNORMAL HIGH (ref 11.5–15.5)
WBC: 7.5 10*3/uL (ref 4.0–10.5)

## 2013-04-15 LAB — GLUCOSE, CAPILLARY
GLUCOSE-CAPILLARY: 154 mg/dL — AB (ref 70–99)
GLUCOSE-CAPILLARY: 146 mg/dL — AB (ref 70–99)
Glucose-Capillary: 176 mg/dL — ABNORMAL HIGH (ref 70–99)
Glucose-Capillary: 144 mg/dL — ABNORMAL HIGH (ref 70–99)

## 2013-04-15 LAB — VITAMIN B12: Vitamin B-12: 558 pg/mL (ref 211–911)

## 2013-04-15 LAB — HEMOGLOBIN A1C
Hgb A1c MFr Bld: 6.4 % — ABNORMAL HIGH (ref <5.7)
Mean Plasma Glucose: 137 mg/dL — ABNORMAL HIGH (ref <117)

## 2013-04-15 LAB — MAGNESIUM: MAGNESIUM: 2 mg/dL (ref 1.5–2.5)

## 2013-04-15 LAB — FIBRINOGEN: FIBRINOGEN: 355 mg/dL (ref 204–475)

## 2013-04-15 LAB — PHOSPHORUS: PHOSPHORUS: 3.5 mg/dL (ref 2.3–4.6)

## 2013-04-15 LAB — TECHNOLOGIST SMEAR REVIEW

## 2013-04-15 LAB — FOLATE RBC: RBC Folate: 950 ng/mL — ABNORMAL HIGH (ref >280)

## 2013-04-15 MED ORDER — FAT EMULSION 20 % IV EMUL
250.0000 mL | INTRAVENOUS | Status: AC
  Administered 2013-04-15: 250 mL via INTRAVENOUS
  Filled 2013-04-15: qty 250

## 2013-04-15 MED ORDER — POTASSIUM CHLORIDE 20 MEQ/15ML (10%) PO LIQD
20.0000 meq | ORAL | Status: AC
  Administered 2013-04-15 (×2): 20 meq via ORAL
  Filled 2013-04-15 (×2): qty 15

## 2013-04-15 MED ORDER — KCL IN DEXTROSE-NACL 40-5-0.45 MEQ/L-%-% IV SOLN
INTRAVENOUS | Status: DC
  Administered 2013-04-16 – 2013-04-18 (×4): via INTRAVENOUS
  Filled 2013-04-15 (×9): qty 1000

## 2013-04-15 MED ORDER — M.V.I. ADULT IV INJ
INTRAVENOUS | Status: AC
  Administered 2013-04-15: 17:00:00 via INTRAVENOUS
  Filled 2013-04-15: qty 2000

## 2013-04-15 NOTE — Progress Notes (Signed)
[facility] PROGRESS NOTE  TREAVON CASTILLEJA ZOX:[id number] DOB: 1935/01/02 DOA: 03/28/2013 PCP:  Duane Lope, MD  Assessment/Plan: Atrial fibrillation/flutter  - appreciate cardiology/EP following  -s/p cardioversion 3/27, restored sinus rhythm and maintained over the weekend -04/11/13 am--he is back in A fib with RVR, rates in the 130s.  -Cardiology reconsulted, appreciate input.  - continue amiodarone 200mg  bid per EP  - Heart rate today 130s  -Defer chronotropic control to cardiology--diltiazem CD increased to 240bid  -HR improving 100-110 -Continue rivaroxaban  Abdominal pain  - secondary to ileus, partial SBO as observed on admission  -now resolved, patient with bowel movements  -has a degree of abdominal pain which is stable.  -Had few loose stool episodes on 3/29, C diff was sent and was negative.  - continue supportive care with analgesia, antiemetics as needed for symptom control.  - nutrition consulted, appreciate input,  -patient is not meeting his nutritional needs based on oral intake.  - patient with intermittent confusion last week, which is now better, and on anticoagulation for his A fib, will hold off PEG tube placement.  -TPN initiated 3/26 and that can be weaned off if po intake improves and pt improves clinically -long discussion with son regarding risks and benefits of enteral feeding vs TPN--he is amenable to TPN short term to see if there is any clinical improvement  -he is also agreeable to enteral feedings (pt had NG feedings in past) and G- or J-tube as more long term solution if no improvement with TPN  -continue oral intake as tolerated, had another swallow evaluation 3/30, his diet was changed from Dys1 to Dys 2,  Moderate malnutrition  - secondary to acute on chronic illness as outlined above  - pt tolerating well however does not seem to meet his nutritional requirements per nutrition  - on TPN, appreciate nutrition and pharmacy consults.    Thrombocytopenia  -Suspect myelosuppression due to acute medical illness  -Check serum B12--558 -RBC folate--950 -Peripheral smear--no schistocytes  -Fibrinogen--355 -HIT panel--results pending -INR 1.14  Leukocytosis  - likely secondary to ileus, HCAP as suggestive per CXR 3/20  - CT abd/pelvis with mild leak around anastomosis but no other acute events, and surgery signed off  - leukocytosis has now resolved  Dysuria  -UA neg for pyuria  HCAP -  -Finished one week cefepime/levoflox and vancomycin 04/07/2013  -Remains afebrile without any respiratory distress  Chronic respiratory failure  -Continue trach collar 35% oxygenation  -clinically stable--SaO2 95-96% Pulmonary vascular congestion - lung exam improving and stable.  - IVF stopped 3/20 due to pulmonary vascular congestion  - monitor I's/O's, daily weights  - weight trend: 203 lbs --> 195 --> 192 >> 194 >> 191 >>186  - repeat CXR 3/29 stable  Hypokalemia - continue supplementation. Magnesium normal.  Sacral ulcer - skin breakdown noted, closely monitor. Nursing care. No evidence of infection.  Anemia of chronic disease - no signs of active bleeding. Mild thrombocytopenia, stable. Likely multifactorial ?medication induced, nutritional deficiencies. Closely monitor.  Essential hypertension - within normal limits today  Tracheostomy status  -pt requires frequent suctioning--will benefit going to LTAC -concerned about ability to provide level of care he needs at [facility] - Patient with a desaturation event 3/30 evening requiring suctioning.  -pulmonary toilet  Hypernatremia - TPN per pharmacy. Improving, supplemented 3/30 with D5W.  Family Communication: updated son on phone  Disposition Plan: [name] vs [facility]      Procedures/Studies: Ct Abdomen Pelvis W Contrast  04/04/2013   CLINICAL DATA:  Liver flight non.  Leukocytosis.  EXAM: CT ABDOMEN AND PELVIS WITH CONTRAST  TECHNIQUE: Multidetector CT imaging of the  abdomen and pelvis was performed using the standard protocol following bolus administration of intravenous contrast.  CONTRAST:  90mL OMNIPAQUE IOHEXOL 300 MG/ML  SOLN  COMPARISON:  DG ABD PORTABLE 1V dated 04/04/2013; CT ABD/PELVIS W CM dated 03/28/2013; CT ABD-PELV W/O CM dated 03/11/2013; CT ABD/PELVIS W CM dated 01/30/2013  FINDINGS: Stable moderate left and small right pleural effusions with associated passive atelectasis. Mild cardiomegaly. Postoperative findings along the gastroesophageal junction.  Slightly reduce marginal definition of the infiltrative hypodense process involving the right hepatic lobe and portions of segment 4 of the liver.  The spleen, adrenal glands, and pancreas unremarkable. Small amount of fluid along the gallbladder fossa and along the inferior edge of the right hepatic lobe could, similar to prior. Gallbladder surgically absent.  There is contrast medium in the renal collecting systems on the initial portal venous phase images, likely due to inadvertent early injection.  Stable renal hypodensities. Infrarenal IVC filter. Continued fluid along the inferior margin of the laparotomy wound. Trace fluid in the lower omentum. Stable trace presacral edema.  Bilateral chronic pars defects at L5 observed with 9 mm of anterolisthesis.  Orally administered contrast extends through to the rectum. Mild circumferential rectal wall thickening.  The patient seems to have had right hemicolectomy, with reanastomosis to the small bowel on image 39 of series 5. Outpouching from the proximal terminal margin of the remaining colon shown on images 32 through 21 of series 5, potentially some type of postoperative diverticulum or contained leak along the stable site.  IMPRESSION: 1. Similar distribution but reduced conspicuity of the peripheral infiltrative hepatic hypodensity affecting the right hepatic lobe an adjacent portion of segment 4. Fatty infiltration is the most common cause for and infiltrative  hypodensity of this type, although localized parenchymal inflammation is not entirely excluded. MRI could differentiate if clinically warranted. 2. Small amount of perihepatic ascites, similar to prior. 3. Hypodense renal lesions, similar to prior. 4. Patient appears to have had right hemicolectomy with small bowel reanastomosis. There is a collection of gas and contrast extending to the right of the proximal terminal margin of the remaining colon, potentially a small contained leak along the staple line. 5. Continued fluid along the inferior margin of the laparotomy wound, but without internal gas density currently.   Electronically Signed   By: Herbie Baltimore M.D.   On: 04/04/2013 18:40   Ct Abdomen Pelvis W Contrast  03/28/2013   CLINICAL DATA:  Abdominal distention.  EXAM: CT ABDOMEN AND PELVIS WITH CONTRAST  TECHNIQUE: Multidetector CT imaging of the abdomen and pelvis was performed using the standard protocol following bolus administration of intravenous contrast.  CONTRAST:  OMNIPAQUE IOHEXOL 300 MG/ML  SOLN  COMPARISON:  DG ABD ACUTE W/CHEST dated 03/28/2013; CT ABD-PELV W/O CM dated 03/11/2013; CT ABD/PELVIS W CM dated 01/30/2013  FINDINGS: There is a new lucency is again noted in the liver. This remains unchanged in appearance and may be infectious in etiology. No clearcut fluid collection in or air collection is noted in the liver to suggest a definite abscess. These changes may be related to phlegmon. Close follow up to evaluate for developing abscess suggested. Hepatic veins patent. Splenic and portal veins patent. No focal significant splenic abnormality. Pancreas normal. Cholecystectomy. No biliary distention.  Adrenals normal. Stable approximate 13 mm low-density lesion in the left kidney, not  definite simple cyst. As noted on prior study, MRI of the kidneys suggest for further evaluation. No hydronephrosis. The bladder is nondistended. No free pelvic fluid. Prostate is not enlarged.  Calcifications within the prostate. No significant adenopathy. Abdominal aorta is atherosclerotic. No aneurysm. Visceral vessels are patent. Inferior vena caval filter noted in the IVC with tip just below the renal veins.  Previously identified drainage catheter in the right upper quadrant has been removed. Inflammatory changes are noted in the right mid abdomen, no abscess noted. The patient has had a prior hemicolectomy. Reference is made to CT report of 01/31/2012 which discusses fistula changes within the abdomen. Patient status post right hemicolectomy. The colon is moderately distended and contains fluid levels. These changes have improved from prior exam suggesting improving ileus. These changes may be related to a diarrheal illness. Follow-up abdominal series suggested. No small bowel distention. The stomach is nondistended. No free air noted.  Atelectasis and/or infiltrates in the lung bases. Bilateral pleural effusions. Cardiomegaly. Coronary artery disease. Midline abdominal scar noted. Fluid noted in the region of the scar with associated small amount of air. Developing abscess should be considered. These findings have progressed slightly from prior study. Multiple anterior abdominal wall subcutaneous nodules most likely injection granulomas.  IMPRESSION: 1. Findings consistent with persistent phlegmon in the liver. 2. Interim removal of right upper quadrant drainage catheter. 3. Colonic distention with air-fluid levels. These findings have improved from prior study suggesting improving adynamic ileus or diarrheal illness. Followup abdominal series suggested. 4. Slight progression of soft tissue fluid in the anterior abdomen in the region the patient's scarring. This could represent developing phlegmon/abscess. Small amount of air is noted within this fluid collection. 5. 13 mm low-density lesion left kidney, not definite simple cyst. As noted on prior study nonenhanced and enhanced MRI of the kidneys  suggested for further evaluation.   Electronically Signed   By: Maisie Fus  Register   On: 03/28/2013 23:29   Dg Chest Port 1 View  04/10/2013   CLINICAL DATA:  Cough and congestion.  EXAM: PORTABLE CHEST - 1 VIEW  COMPARISON:  DG CHEST 1V PORT dated 04/03/2013  FINDINGS: Tracheostomy and left-sided PICC line positioning are stable. The right jugular central line has been removed since the prior chest x-ray. Lungs show lower volumes bilaterally with increased prominence of bilateral lower lobe atelectasis. There may be a component of left-sided pleural fluid. The heart remains moderately enlarged with evidence of prior placement of a left atrial appendage clip. No overt pulmonary edema is identified.  IMPRESSION: Lower volumes with increased prominence of bilateral lower lobe atelectasis. There may be a component of left-sided pleural fluid.   Electronically Signed   By: Irish Lack M.D.   On: 04/10/2013 11:29   Dg Chest Port 1 View  04/03/2013   CLINICAL DATA:  Central venous catheter placement  EXAM: PORTABLE CHEST - 1 VIEW  COMPARISON:  DG CHEST 1V PORT dated 04/01/2013  FINDINGS: Left upper extremity PICC is been placed. Tip is in the lower SVC. Tracheostomy tube and left atrial appendage clip stable. Left pleural effusion and basilar consolidation stable. Vascular congestion increased.  IMPRESSION: Left PICC placed with its tip at the lower SVC.  Increased vascular congestion.  Stable left basilar consolidation and left pleural effusion.   Electronically Signed   By: Maryclare Bean M.D.   On: 04/03/2013 09:04   Dg Chest Port 1 View  04/01/2013   CLINICAL DATA:  Leukocytosis  EXAM: PORTABLE CHEST - 1  VIEW  COMPARISON:  03/28/2013  FINDINGS: Postsurgical changes are again seen. A right-sided jugular central line is noted in the right innominate vein stable from the prior exam. The cardiac shadow is stable. Left basilar consolidation with increasing effusion is noted.  IMPRESSION: Increasing left basilar  infiltrate and effusion.   Electronically Signed   By: Alcide CleverMark  Lukens M.D.   On: 04/01/2013 09:10   Dg Abd 2 Views  03/30/2013   CLINICAL DATA:  Colonic distention.  Multiple abdominal surgeries.  EXAM: ABDOMEN - 2 VIEW  COMPARISON:  DG ABD 2 VIEWS dated 03/29/2013; CT ABD/PELVIS W CM dated 03/28/2013  FINDINGS: Trace blunting of the right costophrenic angle. No free intraperitoneal gas.  Dilated loops of left upper quadrant small bowel measure up to 4 cm, similar to the prior exam. There are several air-fluid levels within these small bowel loops. Reduced colonic gas compared to the prior exam. IVC filter noted.  IMPRESSION: 1. Mildly dilated loops of left upper quadrant small bowel with scattered internal air-fluid levels, similar to the prior exam, with abnormal but nonspecific pattern which could be due to could ileus or partial small bowel obstruction. Correlate with bowel sounds and bowel function.   Electronically Signed   By: Herbie BaltimoreWalt  Liebkemann M.D.   On: 03/30/2013 10:43   Dg Abd 2 Views  03/29/2013   CLINICAL DATA:  Lower abdominal pain. Nausea. Shortness of breath. Weakness.  EXAM: ABDOMEN - 2 VIEW  COMPARISON:  Abdominal radiograph 03/28/2013.  FINDINGS: Some colonic gas is noted. However, there are multiple borderline dilated of mildly dilated loops of gas-filled small bowel measuring up to 4.2 cm in diameter throughout the central abdomen and left side of the abdomen. No pneumoperitoneum appreciated on the left lateral decubitus view. Several small air-fluid levels are noted. Residual iodinated contrast material is noted within the lumen of the urinary bladder related to yesterday's CT examination. Numerous surgical clips are noted in the right upper quadrant of the abdomen. IVC filter projecting over either the right side at L2-L3.  IMPRESSION: 1. Nonspecific bowel gas pattern, as above, suggestive of partial small bowel obstruction. 2. No pneumoperitoneum.   Electronically Signed   By: Trudie Reedaniel   Entrikin M.D.   On: 03/29/2013 13:38   Dg Abd Acute W/chest  03/28/2013   CLINICAL DATA:  Evaluate for a bowel obstruction.  EXAM: ACUTE ABDOMEN SERIES (ABDOMEN 2 VIEW & CHEST 1 VIEW)  COMPARISON:  None.  FINDINGS: Tracheostomy tube tip is above the carina. Heart size is mildly enlarged. The lung volumes are low. There is asymmetric elevation of the left hemidiaphragm. Left pleural effusion is noted.  Patient has an IVC filter. There are persistent abnormally dilated loops of small bowel which measure up to 5.5 cm. Gas is noted within the colon and rectum. There is no evidence of dilated bowel loops or free intraperitoneal air. No radiopaque calculi or other significant radiographic abnormality is seen. Heart size and mediastinal contours are within normal limits. Both lungs are clear.  IMPRESSION: 1. Persistent small bowel dilatation compatible with either partial small bowel obstruction or ileus. 2. No change in aeration to the lungs compared with previous exam   Electronically Signed   By: Signa Kellaylor  Stroud M.D.   On: 03/28/2013 18:56   Dg Abd Portable 1v  04/07/2013   CLINICAL DATA:  Abdominal pain.  EXAM: PORTABLE ABDOMEN - 1 VIEW  COMPARISON:  None.  FINDINGS: The bowel gas pattern is normal. IVC filter seen at the level of L3.  Surgical clips from prior cholecystectomy noted. No radio-opaque calculi or other significant radiographic abnormality are seen.  IMPRESSION: No acute findings.  Unremarkable bowel gas pattern.   Electronically Signed   By: Myles Rosenthal M.D.   On: 04/07/2013 15:26   Dg Abd Portable 1v  04/04/2013   CLINICAL DATA:  Evaluate barium  EXAM: PORTABLE ABDOMEN - 1 VIEW  COMPARISON:  03/30/2013  FINDINGS: Contrast material is noted within the transverse colon and splenic flexure of the colon. Small bowel loop distention has improved. Slight prominence of left abdominal small bowel loops persists. No free air. No organomegaly. Prior cholecystectomy.  IMPRESSION: Improving small bowel  distention. Small amount of oral contrast material within the transverse colon and splenic flexure of the colon.   Electronically Signed   By: Charlett Nose M.D.   On: 04/04/2013 10:18   Dg Swallowing Func-speech Pathology  04/11/2013   Riley Nearing Deblois, CCC-SLP     04/11/2013  3:10 PM Objective Swallowing Evaluation: Modified Barium Swallowing Study   Patient Details  Name: AAIDEN DEPOY MRN: [id number] Date of Birth: 16-Sep-1934  Today's Date: 04/11/2013 Time: 4098-1191 SLP Time Calculation (min): 25 min  Past Medical History:  Past Medical History  Diagnosis Date  . Hypertension   . Hypercholesteremia   . Asthma   . Meniere disease   . Persistent atrial fibrillation     a. s/p multiple dccv's;  b. failed amio;  c. not felt to be AF  RFCA canddiate due to LA dil;  d. s/p failed hybrid ablation @  [facility] in 09/2012;  e. chronic pradaxa.  . Obesity   . Biatrial enlargement     LA size 5.3cm  . Obstructive sleep apnea     AHI 108/hr now on CPAP at 12cm H2O  . H/O hiatal hernia   . GERD (gastroesophageal reflux disease)     "associated w/hiatal hernia" (July 04, 2012)  . Peptic ulcer 1950's  . Migraines     "haven't had one for about 15 years" (04-Jul-2012)  . Arthritis     "joints" (Jul 04, 2012)  . Chronic lower back pain   . Nephrolithiasis ~ 1960's    "passed on their own" (07/04/12)  . History of pneumonia 2002; 2006    "spent 8 days in isolation; Norovirus" (2012/07/04)  . Other and unspecified angina pectoris   . RLS (restless legs syndrome)   . Coronary artery disease     a. 05/2012 Cath/PCI: LM nl, LAD nl, LCX 26m (3.0x15 Integrity  BMS), PTCA of OM1 through stent struts (kissing balloon).  . Diabetes mellitus without complication     FAMILY STATES PATIENT IS NOT DIABETIC   Past Surgical History:  Past Surgical History  Procedure Laterality Date  . Wrist fracture surgery  1992    "repaired w/left hip bone graft" (Jul 04, 2012)  . Total knee arthroplasty  08/19/1999  . Colonoscopy w/ polypectomy  2006  . Knee arthroscopy  with meniscal repair Right 1974    "medial meniscus repaired" (04-Jul-2012)  . Cardioversion  10/02/2011    Procedure: CARDIOVERSION;  Surgeon: Corky Crafts, MD;   Location: [facility] ENDOSCOPY;  Service: Cardiovascular;  Laterality:  N/A;  h/p in file drawer  . Cardioversion  11/07/2011    Procedure: CARDIOVERSION;  Surgeon: Corky Crafts, MD;   Location: [facility] ENDOSCOPY;  Service: Cardiovascular;  Laterality:  N/A;  h/p from 10/22 in file drawer/dl  . Cardiac catheterization  05/26/2012  . Coronary angioplasty with stent placement  Jul 04, 2012    "  1" (06/04/2012)  . Cataract extraction w/ intraocular lens  implant, bilateral   2009  . Hemorrhoid surgery  ~ 2003  . Hiatal hernia repair  1983  . Nissen fundoplication  1983  . Ablation of dysrhythmic focus  SEPT/OCT 2014    ATRIAL FIB  . Inguinal hernia repair Bilateral 2000 and 2005    "one done at a time" (06/04/2012)  . Colon surgery  10/08/2012   . 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 Pt was in  prolonged respiratory failure at [facility], unable to wean of the vent  and requiring trach placement 10/29/2012. He was transferred to  [facility] in December 2014 and later to [facility] in late January  2015. He was on TNA and was doing fairly well initially, but  developed more abdominal pain and on 2/23 taken to OR in [place]  for gallbladder removal (secondary to cholecystitis), resection  for part of the colon for fistula, IVC filter placement. Sent  back to [facility] on march 4th, 2015 and started on TPN. Diet  advanced on March 9th, 2015 after pt passed swallow evaluation.  He was brought to [facility] March 16th, after noticing progressive  abdominal distension. In ED, CT abdomen with air- fluid  level and  findings consistent with early partial SBO, ileus. Surgery  reports pt is ok for PO from their standpoint, SLP ordered for  swallow eval.      Assessment / Plan / Recommendation Clinical Impression  Dysphagia Diagnosis: Moderate pharyngeal phase dysphagia Clinical impression: Pt demonstrates significant improvement in  motor function since last MBS. Pts primary decifits are now  exclusively sensory in nature. Pt has a delay in swallow  initiation with nectar and thin teaspoon amounts with aspiration  before the swallow. With a chin tuck, the pt is albe to protect  the airway before the swallow with nectar vea teaspoon or straw.  Pt is also able to masticate soft and regular textured solids. He  was noted to have expectoration of mucous/barium after the test,  possible esophageal component not visualized due to body habitus.  Pt is recommended to upgrade to a dys 2 Finely chopped diet with  nectar thick liquids via spoon or straws with full supervison for  a chin tuck and placement of PMSV. Pt may upgrade solids if he  tolerates dys 2 diet.     Treatment Recommendation  Therapy as outlined in treatment plan below    Diet Recommendation Dysphagia 2 (Fine chop);Nectar-thick liquid   Liquid Administration via: Spoon;Straw Medication Administration: Crushed with puree Supervision: Patient able to self feed;Full supervision/cueing  for compensatory strategies Compensations: Slow rate;Small sips/bites Postural Changes and/or Swallow Maneuvers: Chin tuck;Seated  upright 90 degrees    Other  Recommendations Oral Care Recommendations: Oral care BID Other Recommendations: Place PMSV during PO intake;Order  thickener from pharmacy;Have oral suction available   Follow Up Recommendations  Skilled Nursing facility    Frequency and Duration min 2x/week  2 weeks   Pertinent Vitals/Pain NA    SLP Swallow Goals     General HPI: Pt is 79 yo male with fairly recent hospitalization  at [facility] in September 2014 for failed maize  procedure and during  that hospitalization pt developed volvulus  and has required right  colectomy (10/08/2012), subsequently 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 Pt was in  prolonged respiratory failure at [facility], unable to wean of the vent  and requiring trach placement 10/29/2012. He was transferred to  [facility] in December 2014 and later to [facility] in late January  2015. He was on TNA and was doing fairly well initially, but  developed more abdominal pain and on 2/23 taken to OR in [place]  for gallbladder removal (secondary to cholecystitis), resection  for part of the colon for fistula, IVC filter placement. Sent  back to [facility] on march 4th, 2015 and started on TPN. Diet  advanced on March 9th, 2015 after pt passed swallow evaluation.  He was brought to [facility] March 16th, after noticing progressive  abdominal distension. In ED, CT abdomen with air- fluid level and  findings consistent with early partial SBO, ileus. Surgery  reports pt is ok for PO from their standpoint, SLP ordered for  swallow eval.  Type of Study: Modified Barium Swallowing Study Reason for Referral: Objectively evaluate swallowing function Previous Swallow Assessment: [facility] hospital - 3/16 pt "passed"  FEES and was started on diet, began to decline and blue dye test  was given, pt silently aspirated thin, nectar, honey per report.  Diet Prior to this Study: Dysphagia 1 (puree);Pudding-thick  liquids Temperature Spikes Noted: No Respiratory Status: Trach collar Trach Size and Type: Cuff;Deflated;With PMSV in place;#6;Other  (Comment) History of Recent Intubation: No Behavior/Cognition: Alert;Cooperative;Requires cueing;Hard of  hearing Oral Cavity - Dentition: Missing dentition Oral Motor / Sensory Function: Impaired - see Bedside swallow  eval Self-Feeding Abilities: Able to feed self;Needs assist Patient Positioning: Upright in chair Baseline Vocal Quality: Low vocal intensity Volitional Cough: Strong  Volitional Swallow: Able to elicit Anatomy: Within functional limits Pharyngeal Secretions: Not observed secondary MBS    Reason for Referral Objectively evaluate swallowing function   Oral Phase Oral Preparation/Oral Phase Oral Phase: Impaired Oral Phase - Comment Oral Phase - Comment: missing dentition, but places boluse  effectively between remaining teeth. otherwise WFL.    Pharyngeal Phase Pharyngeal Phase Pharyngeal Phase: Impaired Pharyngeal - Honey Pharyngeal - Honey Teaspoon: Not tested Pharyngeal - Honey Cup: Not tested Pharyngeal - Nectar Pharyngeal - Nectar Teaspoon: Delayed swallow  initiation;Penetration/Aspiration before swallow (chin tuck  increases airway closure, preents penetration) Penetration/Aspiration details (nectar teaspoon): Material enters  airway, passes BELOW cords without attempt by patient to eject  out (silent aspiration);Material does not enter airway Pharyngeal - Nectar Straw: Delayed swallow initiation (chin tuck) Pharyngeal - Thin Pharyngeal - Thin Teaspoon: Delayed swallow  initiation;Penetration/Aspiration before swallow (chin tuck) Penetration/Aspiration details (thin teaspoon): Material enters  airway, CONTACTS cords and not ejected out Pharyngeal - Solids Pharyngeal - Puree: Delayed swallow initiation (chin tuck) Pharyngeal - Mechanical Soft: Delayed swallow initiation Pharyngeal - Regular: Delayed swallow initiation  Cervical Esophageal Phase    GO             Harlon Ditty, MA CCC-SLP [phone]  Claudine Mouton 04/11/2013, 3:08 PM    Dg Swallowing Func-speech Pathology  04/02/2013   Breck Coons [place], CCC-SLP     04/02/2013  2:03 PM Objective Swallowing Evaluation: Modified Barium Swallowing Study   Patient Details  Name: BEDFORD WINSOR MRN: [id number] Date of Birth: 1934/03/26  Today's Date: 04/02/2013 Time: 1225-1250 SLP Time Calculation (min): 25 min  Past Medical History:  Past Medical History  Diagnosis Date  . Hypertension   . Hypercholesteremia   .  Asthma   .  Meniere disease   . Persistent atrial fibrillation     a. s/p multiple dccv's;  b. failed amio;  c. not felt to be AF  RFCA canddiate due to LA dil;  d. s/p failed hybrid ablation @  [facility] in 09/2012;  e. chronic pradaxa.  . Obesity   . Biatrial enlargement     LA size 5.3cm  . Obstructive sleep apnea     AHI 108/hr now on CPAP at 12cm H2O  . H/O hiatal hernia   . GERD (gastroesophageal reflux disease)     "associated w/hiatal hernia" (2012/06/22)  . Peptic ulcer 1950's  . Migraines     "haven't had one for about 15 years" (06/22/12)  . Arthritis     "joints" (2012/06/22)  . Chronic lower back pain   . Nephrolithiasis ~ 1960's    "passed on their own" (2012-06-22)  . History of pneumonia 2002; 2006    "spent 8 days in isolation; Norovirus" (2012-06-22)  . Other and unspecified angina pectoris   . RLS (restless legs syndrome)   . Coronary artery disease     a. 05/2012 Cath/PCI: LM nl, LAD nl, LCX 25m (3.0x15 Integrity  BMS), PTCA of OM1 through stent struts (kissing balloon).  . Diabetes mellitus without complication     FAMILY STATES PATIENT IS NOT DIABETIC   Past Surgical History:  Past Surgical History  Procedure Laterality Date  . Wrist fracture surgery  1992    "repaired w/left hip bone graft" (June 22, 2012)  . Total knee arthroplasty  08/19/1999  . Colonoscopy w/ polypectomy  2006  . Knee arthroscopy with meniscal repair Right 1974    "medial meniscus repaired" (06/22/12)  . Cardioversion  10/02/2011    Procedure: CARDIOVERSION;  Surgeon: Corky Crafts, MD;   Location: [facility] ENDOSCOPY;  Service: Cardiovascular;  Laterality:  N/A;  h/p in file drawer  . Cardioversion  11/07/2011    Procedure: CARDIOVERSION;  Surgeon: Corky Crafts, MD;   Location: [facility] ENDOSCOPY;  Service: Cardiovascular;  Laterality:  N/A;  h/p from 10/22 in file drawer/dl  . Cardiac catheterization  05/26/2012  . Coronary angioplasty with stent placement  06-22-2012    "1" (Jun 22, 2012)  . Cataract extraction w/ intraocular lens  implant, bilateral    2009  . Hemorrhoid surgery  ~ 2003  . Hiatal hernia repair  1983  . Nissen fundoplication  1983  . Ablation of dysrhythmic focus  SEPT/OCT 2014    ATRIAL FIB  . Inguinal hernia repair Bilateral 2000 and 2005    "one done at a time" (22-Jun-2012)  . Colon surgery  10/08/2012   . Tracheostomy  OCT 2014   HPI:  Pt is 79 yo male with fairly recent hospitalization at [facility] in  September 2014 for failed maize procedure and during that  hospitalization pt developed volvulus and has required right  colectomy (10/08/2012), subsequently 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 Pt was in  prolonged respiratory failure at [facility], unable to wean of the vent  and requiring trach placement 10/29/2012. He was transferred to  [facility] in December 2014 and later to [facility] in late January  2015. He was on TNA and was doing fairly well initially, but  developed more abdominal pain and on 2/23 taken to OR in [place]  for gallbladder removal (secondary to cholecystitis), resection  for part of the colon for fistula, IVC filter placement. Sent  back to [facility] on march 4th, 2015 and  started on TPN. Diet  advanced on March 9th, 2015 after pt passed swallow evaluation.  He was brought to [facility] March 16th, after noticing progressive  abdominal distension. In ED, CT abdomen with air- fluid level and  findings consistent with early partial SBO, ileus. Surgery  reports pt is ok for PO from their standpoint, SLP ordered for  swallow eval.      Assessment / Plan / Recommendation Clinical Impression  Dysphagia Diagnosis: Moderate pharyngeal phase dysphagia;Severe  pharyngeal phase dysphagia Clinical impression: MBS completed with pt's PMSV donned and  appears to be congruent with study performed at [facility].  He  demonstrated moderate-severe motor based pharyngeal dyspahgia as  evidenced by reduced tongue base retraction, decreased laryngeal  elevation and decreased pharyngeal contraction.  Impairments led  to mild  pharyngeal residue in vallecule/pyriform sinuses and  posterior pharyngeal wall and laryngeal vestibule consistently  silent penetration to vocal cords.  Verbal cues for hard cough  briefly cleared vestibule with subsequent penetration from  residue.  SLP recommends Dys 1 (puree) only, no liquids (pudding  thick liquid) with speaking valve donned, two swallows,  volitional coughs, pills crushed in applesauce and full  supervision/assist.       Treatment Recommendation  Therapy as outlined in treatment plan below    Diet Recommendation Dysphagia 1 (Puree);Pudding-thick liquid   Medication Administration: Crushed with puree Supervision: Patient able to self feed;Full supervision/cueing  for compensatory strategies Compensations: Slow rate;Small sips/bites;Multiple dry swallows  after each bite/sip;Hard cough after swallow Postural Changes and/or Swallow Maneuvers: Seated upright 90  degrees;Upright 30-60 min after meal, wear speaking valve during  all po's    Other  Recommendations Oral Care Recommendations: Oral care BID Other Recommendations: Order thickener from pharmacy   Follow Up Recommendations  Skilled Nursing facility    Frequency and Duration min 2x/week  2 weeks   Pertinent Vitals/Pain WDL            Reason for Referral Objectively evaluate swallowing function   Oral Phase Oral Preparation/Oral Phase Oral Phase: WFL (WFL for textures assessed)   Pharyngeal Phase Pharyngeal Phase Pharyngeal Phase: Impaired Pharyngeal - Honey Pharyngeal - Honey Teaspoon: Reduced pharyngeal  peristalsis;Pharyngeal residue - valleculae;Reduced tongue base  retraction;Penetration/Aspiration during swallow;Pharyngeal  residue - pyriform sinuses;Reduced laryngeal elevation Penetration/Aspiration details (honey teaspoon): Material enters  airway, CONTACTS cords and not ejected out Pharyngeal - Honey Cup: Penetration/Aspiration during  swallow;Pharyngeal residue - valleculae;Pharyngeal residue -  pyriform sinuses;Reduced tongue base  retraction;Reduced laryngeal  elevation Penetration/Aspiration details (honey cup): Material enters  airway, remains ABOVE vocal cords then ejected out Pharyngeal - Solids Pharyngeal - Puree: Pharyngeal residue - valleculae;Reduced  tongue base retraction;Pharyngeal residue - pyriform  sinuses;Reduced laryngeal elevation  Cervical Esophageal Phase        Cervical Esophageal Phase Cervical Esophageal Phase: [facility]         Darrow Bussing.Ed CCC-SLP Pager [phone]  04/02/2013         Subjective: Patient denies any worsening shortness breath, chest pain, vomiting, abdominal pain, fevers, chills. Still has poor by mouth intake.  Objective: Filed Vitals:   04/15/13 0758 04/15/13 1103 04/15/13 1351 04/15/13 1545  BP:   99/65   Pulse: 112 108 110 129  Temp:   98.2 F (36.8 C)   TempSrc:   Oral   Resp: 20 20 20 22   Height:      Weight:      SpO2: 93% 96% 97% 94%    Intake/Output Summary (Last 24 hours)  at 04/15/13 1835 Last data filed at 04/15/13 1709  Gross per 24 hour  Intake 2487.67 ml  Output      0 ml  Net 2487.67 ml   Weight change: 5.897 kg (13 lb) Exam:   General:  Pt is alert, follows commands appropriately, not in acute distress  HEENT: No icterus, No thrush, [place]/AT  Cardiovascular: RRR, S1/S2, no rubs, no gallops  Respiratory: Scattered bilateral rales. No wheezing. Good air movement.  Abdomen: Soft/+BS, non tender, non distended, no guarding  Extremities: trace LE edema, No lymphangitis, No petechiae, No rashes, no synovitis  Data Reviewed: Basic Metabolic Panel:  Recent Labs Lab 04/09/13 0416 04/10/13 0530 04/11/13 0456 04/11/13 1745 04/12/13 0525 04/13/13 0605 04/14/13 0530 04/15/13 0530  NA 155* 151* 152* 151* 149* 148* 148* 144  K 3.3* 3.4* 3.6* 3.6* 3.3* 3.3* 3.6* 3.3*  CL 116* 114* 114* 113* 111 110 109 105  CO2 27 26 26 27 29 27 28 27   GLUCOSE 188* 206* 221* 220* 183* 181* 168* 158*  BUN 19 25* 25* 25* 24* 21 19 16   CREATININE 0.73 0.69  0.57 0.60 0.59 0.59 0.57 0.59  CALCIUM 8.9 8.7 8.8 8.8 8.8 8.5 8.6 8.3*  MG 2.2 2.2 2.2  --   --   --  2.1 2.0  PHOS 2.9 2.8 3.1  --   --   --  3.5 3.5   Liver Function Tests:  Recent Labs Lab 04/09/13 0416 04/10/13 0530 04/11/13 0456 04/12/13 0525 04/14/13 0530  AST 32 27 19 20  79*  ALT 42 38 33 27 69*  ALKPHOS 68 63 68 64 76  BILITOT 0.3 0.3 0.4 0.4 0.3  PROT 5.1* 4.9* 5.1* 4.9* 5.0*  ALBUMIN 2.4* 2.3* 2.3* 2.1* 2.2*   No results found for this basename: LIPASE, AMYLASE,  in the last 168 hours No results found for this basename: AMMONIA,  in the last 168 hours CBC:  Recent Labs Lab 04/10/13 0530 04/11/13 0456 04/12/13 0525 04/13/13 0605 04/15/13 0530  WBC 9.9 10.3 8.9 7.0 7.5  NEUTROABS  --  8.6*  --   --  5.5  HGB 8.4* 8.7* 8.5* 8.5* 8.6*  HCT 27.7* 28.5* 28.4* 27.6* 28.0*  MCV 98.9 98.6 99.3 96.8 96.9  PLT 114* 114* 100* 105* 108*   Cardiac Enzymes: No results found for this basename: CKTOTAL, CKMB, CKMBINDEX, TROPONINI,  in the last 168 hours BNP: No components found with this basename: POCBNP,  CBG:  Recent Labs Lab 04/14/13 1639 04/14/13 2147 04/15/13 0637 04/15/13 1135 04/15/13 1622  GLUCAP 207* 132* 154* 146* 176*    Recent Results (from the past 240 hour(s))  CLOSTRIDIUM DIFFICILE BY PCR     Status: None   Collection Time    04/10/13  9:03 AM      Result Value Ref Range Status   C difficile by pcr NEGATIVE  NEGATIVE Final  URINE CULTURE     Status: None   Collection Time    04/13/13  2:31 PM      Result Value Ref Range Status   Specimen Description URINE, RANDOM   Final   Special Requests NONE   Final   Culture  Setup Time     Final   Value: 04/13/2013 18:52     Performed at [facility] Count     Final   Value: 8,000 COLONIES/ML     Performed at [facility]   Culture     Final  Value: INSIGNIFICANT GROWTH     Performed at [facility]   Report Status 04/14/2013 FINAL   Final     Scheduled  Meds: . amiodarone  200 mg Oral BID  . atorvastatin  40 mg Oral Daily  . chlorhexidine  15 mL Mouth/Throat BID  . diltiazem  240 mg Oral BID  . feeding supplement (ENSURE)  1 Container Oral TID BM  . hydrocortisone cream   Topical BID  . insulin aspart  0-9 Units Subcutaneous TID WC  . pantoprazole  40 mg Oral Daily  . predniSONE  5 mg Oral Q breakfast  . rivaroxaban  20 mg Oral Q supper  . roflumilast  500 mcg Oral Daily  . sodium chloride  10-40 mL Intracatheter Q12H   Continuous Infusions: . sodium chloride    . dextrose 5 % and 0.45 % NaCl with KCl 40 mEq/L    . Marland KitchenTPN (CLINIMIX-E) Adult 60 mL/hr at 04/15/13 1714   And  . fat emulsion 250 mL (04/15/13 1714)     Avalin Briley, DO  [facility] Pager [phone]  If 7PM-7AM, please contact night-coverage [email or web address] Password [facility] 04/15/2013, 6:35 PM   LOS: 18 days

## 2013-04-15 NOTE — Progress Notes (Addendum)
PARENTERAL NUTRITION CONSULT NOTE - FOLLOW UP  Pharmacy Consult for TPN Indication: inadequate PO intake, resolving SBO  Allergies  Allergen Reactions  . Corticosteroids     Inhaled Corticosteroids--Hoarseness, dry mouth  . Furosemide Other (See Comments)    Ototoxicity     Patient Measurements: Height: 5' 8.9" (175 cm) Weight: 200 lb (90.719 kg) IBW/kg (Calculated) : 70.47   Vital Signs: Temp: 98.5 F (36.9 C) (04/03 0422) Temp src: Oral (04/03 0422) BP: 115/63 mmHg (04/03 0422) Pulse Rate: 112 (04/03 0758) Intake/Output from previous day: 04/02 0701 - 04/03 0700 In: 120 [P.O.:120] Out: -  Intake/Output from this shift:    Labs:  Recent Labs  04/13/13 0605 04/15/13 0530  WBC 7.0 7.5  HGB 8.5* 8.6*  HCT 27.6* 28.0*  PLT 105* 108*     Recent Labs  04/13/13 0605 04/14/13 0530 04/15/13 0530  NA 148* 148* 144  K 3.3* 3.6* 3.3*  CL 110 109 105  CO2 _0 GLUCOSE 181* 168* 158*  BUN _1 CREATININE 0.59 0.57 0.59  CALCIUM 8.5 8.6 8.3*  MG  --  2.1 2.0  PHOS  --  3.5 3.5  PROT  --  5.0*  --   ALBUMIN  --  2.2*  --   AST  --  79*  --   ALT  --  69*  --   ALKPHOS  --  76  --   BILITOT  --  0.3  --    Estimated Creatinine Clearance: 86 ml/min (by C-G formula based on Cr of 0.59).    Recent Labs  04/14/13 1639 04/14/13 2147 04/15/13 0637  GLUCAP 207* 132* 154*    Insulin Requirements in the past 24 hours:  2 units of Novolog + 10 units regular insulin in TPN bag  Current Nutrition:  Dysphagia 2 diet with poor PO intake documented (0-25%) Ensure pudding po TID- each supplement provides 170kcal and 4g protein Clinimix E 5/15 to 40 ml/hr + 20% lipid emulsion at 10 ml/hr.  This provides 48g protein and 1162 kcal daily  Nutritional Goals:  2050-2250 kCal, 110-120 grams of protein per day  Admit: Transferred from [place] with abdominal pain, partial SBO, ileus.  GI: resolved ileus per surgery.  Fall 2014 - volvulus, multiple abd  surgeries, fistula development, was on TPN long-term but recently stopped and was tolerating PO intake per report. Now with poor PO intake. Now on Dysphagia 2 diet w/ no N/V and good bowel movement. Per MD will use TPN for support since not planning to place PEG due to anticoagulation (Xarelto). Noted pt with diarrhea 3/28 but improved 3/29. Cdiff negative. Please see RD note 3/27: recs for short-term enteral support via NGT is appropriate as he has a functioning GI tract. Prealbumin 11.4 (low) on 3/30. It seems plan is to hold off on placing PEG tube and likely discharging patient to [facility] today on short term TPN.  Endo: CBGs in last 24 hours 132-154, requiring some SSI, with 15 units of insulin in TPN bag. On PO prednisone  Lytes: Na 144 (trending down); K 3.3, Phos 3.5, Mag 2. Currently on D5-1/2NS with 98mq K @ 517mhr for hypernatremia and hypokalemia  Renal: SCr stable  Pulm: Trach collar, Daliresp  Cards: Afib - VSS (NSR s/p cardioversion 3/27); diltiazem, Xarelto- per EP to continue without interruption, if patient needs to have PEG placed, may need to discuss transitioning to heparin for procedure and then resuming Xarelto post-op. Continuing amiodarone  200mg BID and will repeat cardioversion in 3-4 weeks  Hepatobil: AST and ALT increased to 79 and 69, respectivelyLFTs wnl, alk phos nml, trigs normal, Albumin 2.2  Neuro: A&O  ID: completed a 7d course of ABX for HCAP 3/26  Best Practices: Xarelto for atrial fibrillation  TPN Access: PICC placed 3/22  TPN day#: 9  Plan:  1. Discussed with Katie Lamberton, RD. Increase Clinimix E 5/15 to 60 ml/hr + 20% lipid emulsion at 10 ml/hr since plan is for short term therapy. This provides 1502kCal (~72% of goal) and 72g protein (65% of goal) per day.  2. Consider placing a feeding tube of some sort and feeding enterally as his gut works. TPN not appropriate nutrition support in this pt with functioning GI tract. 3. Continue CBGs and SSI  TID with meals. Will increase insulin in TPN bag back to 15 units.  4. Daily MVI; trace elements M/W/F only due to national shortage  5. BMET in the morning 6. Replete K with 20mEq KCl liquid x2 doses. Increase potassium in MIVF to 40 mEq.    , PharmD.  Clinical Pharmacist Pager [phone]   

## 2013-04-15 NOTE — Progress Notes (Signed)
Speech Language Pathology Treatment: Dysphagia;Passy Muir Speaking valve  Patient Details Name: Tim BarerJames B Niblett MRN: [id number] DOB: 04/13/1934 Today's Date: 04/15/2013 Time: [id number] SLP Time Calculation (min): 16 min  Assessment / Plan / Recommendation Clinical Impression  Provided min visual cues to reinforce chin tuck, pt able to carry over for 4-5 sips, then refused further trials. Tolerated PMSV placement for 15 minutes without signs of retention of CO2, improved from last session. SLP provided moderate tactile cues to help pt find and remove valve independently. Pt reports he does not like wearing his valve because it hurts to talk (sore throat?). He also reports he has worn it up to an hour with visitors present. Still says he does not want to eat because food is not good. Don't think it is related to Dys 2 diet, just that he has no enjoyment from food and the flavors taste different to him.    HPI HPI: Pt is 79 yo male with fairly recent [facility] September [facility] for failed maize procedure and during that hospitalization pt developed volvulus and has required right colectomy (10/08/2012), subsequently developed multiple abscesses and enteric fistulas managed percutaneous drains. Pt [facility], unable to wean of the vent and requiring trach placement 10/29/2012. He was transferred to [facility] in December 2014 and later to [facility] in late January 2015. He was on TNA and was doing fairly well initially, but developed more abdominal pain and on 2/23 taken to OR in [place] for gallbladder removal (secondary to cholecystitis), resection for part of the colon for fistula, IVC filter placement. Sent back to [facility] on march 4th, 2015 and started on TPN. Diet advanced on March 9th, 2015 after pt passed swallow evaluation. [facility], after noticing progressive abdominal distension. In ED, CT abdomen with air- fluid level and findings  consistent with early partial SBO, ileus. Surgery reports pt is ok for PO from their standpoint, SLP ordered for swallow eval.    Pertinent Vitals NA  SLP Plan  Continue with current plan of care    Recommendations Diet recommendations: Nectar-thick liquid;Dysphagia 2 (fine chop) Liquids provided via: Cup Medication Administration: Crushed with puree Supervision: Patient able to self feed;Full supervision/cueing for compensatory strategies Compensations: Slow rate;Small sips/bites Postural Changes and/or Swallow Maneuvers: Chin tuck;Seated upright 90 degrees      Patient may use Passy-Muir Speech Valve: Intermittently with supervision;During all therapies with supervision;Caregiver trained to provide supervision PMSV Supervision: Intermittent       Plan: Continue with current plan of care    GO    [facility] Hospital Medical CenterBonnie Aneesah Hernan, MA CCC-SLP [phone]  Claudine MoutonDeBlois, Kierstynn Babich Caroline 04/15/2013, 4:10 PM

## 2013-04-16 DIAGNOSIS — R633 Feeding difficulties, unspecified: Secondary | ICD-10-CM

## 2013-04-16 LAB — COMPREHENSIVE METABOLIC PANEL
ALT: 69 U/L — ABNORMAL HIGH (ref 0–53)
AST: 56 U/L — ABNORMAL HIGH (ref 0–37)
Albumin: 2.1 g/dL — ABNORMAL LOW (ref 3.5–5.2)
Alkaline Phosphatase: 80 U/L (ref 39–117)
BUN: 16 mg/dL (ref 6–23)
CO2: 28 mEq/L (ref 19–32)
CREATININE: 0.62 mg/dL (ref 0.50–1.35)
Calcium: 8.4 mg/dL (ref 8.4–10.5)
Chloride: 106 mEq/L (ref 96–112)
GFR calc non Af Amer: >90 mL/min (ref >90)
GLUCOSE: 171 mg/dL — AB (ref 70–99)
Potassium: 3.6 mEq/L — ABNORMAL LOW (ref 3.7–5.3)
Sodium: 145 mEq/L (ref 137–147)
TOTAL PROTEIN: 4.9 g/dL — AB (ref 6.0–8.3)
Total Bilirubin: 0.3 mg/dL (ref 0.3–1.2)

## 2013-04-16 LAB — PROTIME-INR
INR: 1.13 (ref 0.00–1.49)
Prothrombin Time: 14.3 seconds (ref 11.6–15.2)

## 2013-04-16 LAB — CLOSTRIDIUM DIFFICILE BY PCR: CDIFFPCR: POSITIVE — AB

## 2013-04-16 LAB — GLUCOSE, CAPILLARY
GLUCOSE-CAPILLARY: 145 mg/dL — AB (ref 70–99)
GLUCOSE-CAPILLARY: 160 mg/dL — AB (ref 70–99)
GLUCOSE-CAPILLARY: 129 mg/dL — AB (ref 70–99)
Glucose-Capillary: 166 mg/dL — ABNORMAL HIGH (ref 70–99)

## 2013-04-16 LAB — CBC
HCT: 26.4 % — ABNORMAL LOW (ref 39.0–52.0)
Hemoglobin: 8.3 g/dL — ABNORMAL LOW (ref 13.0–17.0)
MCH: 30.2 pg (ref 26.0–34.0)
MCHC: 31.4 g/dL (ref 30.0–36.0)
MCV: 96 fL (ref 78.0–100.0)
Platelets: 109 10*3/uL — ABNORMAL LOW (ref 150–400)
RBC: 2.75 MIL/uL — AB (ref 4.22–5.81)
RDW: 19.9 % — ABNORMAL HIGH (ref 11.5–15.5)
WBC: 8.8 10*3/uL (ref 4.0–10.5)

## 2013-04-16 LAB — HEPARIN LEVEL (UNFRACTIONATED): Heparin Unfractionated: 0.22 IU/mL — ABNORMAL LOW (ref 0.30–0.70)

## 2013-04-16 LAB — PHOSPHORUS: PHOSPHORUS: 3.5 mg/dL (ref 2.3–4.6)

## 2013-04-16 LAB — MAGNESIUM: MAGNESIUM: 2 mg/dL (ref 1.5–2.5)

## 2013-04-16 LAB — APTT: aPTT: 30 seconds (ref 24–37)

## 2013-04-16 MED ORDER — M.V.I. ADULT IV INJ
INTRAVENOUS | Status: AC
  Administered 2013-04-16: 18:00:00 via INTRAVENOUS
  Filled 2013-04-16: qty 2000

## 2013-04-16 MED ORDER — FAT EMULSION 20 % IV EMUL
250.0000 mL | INTRAVENOUS | Status: AC
  Administered 2013-04-16: 250 mL via INTRAVENOUS
  Filled 2013-04-16: qty 250

## 2013-04-16 MED ORDER — METOPROLOL TARTRATE 12.5 MG HALF TABLET
12.5000 mg | ORAL_TABLET | Freq: Two times a day (BID) | ORAL | Status: DC
  Administered 2013-04-16 – 2013-04-25 (×16): 12.5 mg via ORAL
  Filled 2013-04-16 (×23): qty 1

## 2013-04-16 MED ORDER — METRONIDAZOLE 500 MG PO TABS
500.0000 mg | ORAL_TABLET | Freq: Three times a day (TID) | ORAL | Status: DC
  Administered 2013-04-16 – 2013-04-20 (×12): 500 mg via ORAL
  Filled 2013-04-16 (×19): qty 1

## 2013-04-16 MED ORDER — POTASSIUM CHLORIDE 20 MEQ/15ML (10%) PO LIQD
40.0000 meq | Freq: Once | ORAL | Status: DC
  Filled 2013-04-16: qty 30

## 2013-04-16 MED ORDER — HEPARIN (PORCINE) IN NACL 100-0.45 UNIT/ML-% IJ SOLN
1250.0000 [IU]/h | INTRAMUSCULAR | Status: DC
  Administered 2013-04-16: 1100 [IU]/h via INTRAVENOUS
  Administered 2013-04-17: 1200 [IU]/h via INTRAVENOUS
  Administered 2013-04-18 – 2013-04-20 (×3): 1250 [IU]/h via INTRAVENOUS
  Filled 2013-04-16 (×8): qty 250

## 2013-04-16 MED ORDER — POTASSIUM CHLORIDE CRYS ER 20 MEQ PO TBCR
40.0000 meq | EXTENDED_RELEASE_TABLET | Freq: Once | ORAL | Status: AC
  Administered 2013-04-16: 40 meq via ORAL
  Filled 2013-04-16: qty 2

## 2013-04-16 NOTE — Progress Notes (Signed)
Patient ID: Tim Short, male   DOB: 10/20/1934, 79 y.o.   MRN: [id number]   IR aware of G tube request Will eval pt in am Tentative for 4/6

## 2013-04-16 NOTE — Progress Notes (Signed)
Patient has refused to eat all meals yesterday and today. Patient receiving TPN and has orders for PEG tube. Will continue to monitor closely. Lajuana Matteina Malaiah Viramontes, RN

## 2013-04-16 NOTE — Progress Notes (Signed)
[facility] PROGRESS NOTE  Tim Short ZOX:[id number] DOB: 10-28-1934 DOA: 03/28/2013 PCP:  Duane Lope, MD  Assessment/Plan: Atrial fibrillation/flutter  - appreciate cardiology/EP following  -s/p cardioversion 3/27, restored sinus rhythm and maintained over the weekend -04/11/13 am--he is back in A fib with RVR, rates in the 130s.  -Cardiology reconsulted, appreciate input.  - continue amiodarone 200mg  bid per EP  -Defer chronotropic control to cardiology--diltiazem CD increased to 240bid  -HR improving 110-120 -Continue rivaroxaban  -add metoprolol tartrate 12.5 mg by mouth daily -Discontinue rivaroxaban today and start the patient on IV heparin in preparation for gastrostomy tube placement  -Consult IR for gastrostomy tube placement  Abdominal pain  - secondary to ileus, partial SBO as observed on admission  -now resolved, patient with bowel movements  -has a degree of abdominal pain which is stable.  -Had few loose stool episodes on 3/29, C diff was sent and was negative.  - continue supportive care with analgesia, antiemetics as needed for symptom control.  - nutrition consulted, appreciate input,  -patient is not meeting his nutritional needs based on oral intake.  - patient with intermittent confusion last week, which is now better, and on anticoagulation for his A fib, will hold off PEG tube placement.  -Consult IR for gastrostomy tube placement -TPN initiated 3/26 and that can be weaned off if po intake improves and pt improves clinically  -long discussion with son regarding risks and benefits of enteral feeding vs TPN--he is amenable to gastrostomy tube placement -continue oral intake as tolerated, had another swallow evaluation 3/30, his diet was changed from Dys1 to Dys 2,  Moderate malnutrition  - secondary to acute on chronic illness as outlined above  - pt tolerating well however does not seem to meet his nutritional requirements per nutrition  - on TPN,  appreciate nutrition and pharmacy consults.  -Ultimately plan to switch the patient enteral feeding  Thrombocytopenia  -Suspect myelosuppression due to acute medical illness  -Check serum B12--558  -RBC folate--950  -Peripheral smear--no schistocytes  -Fibrinogen--355  -HIT panel--results pending  -INR 1.14  Leukocytosis  - likely secondary to ileus, HCAP as suggestive per CXR 3/20  - CT abd/pelvis with mild leak around anastomosis but no other acute events, and surgery signed off  - leukocytosis has now resolved  Dysuria  -UA neg for pyuria  HCAP -  -Finished one week cefepime/levoflox and vancomycin 04/07/2013  -Remains afebrile without any respiratory distress  Chronic respiratory failure  -Continue trach collar 35% oxygenation  -clinically stable--SaO2 95-96%  Pulmonary vascular congestion - lung exam improving and stable.  - IVF stopped 3/20 due to pulmonary vascular congestion  - monitor I's/O's, daily weights  - weight trend: 203 lbs --> 195 --> 192 >> 194 >> 191 >>186  - repeat CXR 3/29 stable  Hypokalemia - continue supplementation. Magnesium normal.  Sacral ulcer - skin breakdown noted, closely monitor. Nursing care. No evidence of infection.  Anemia of chronic disease - no signs of active bleeding.  Likely multifactorial ?medication induced, nutritional deficiencies. Closely monitor.  Essential hypertension - within normal limits today  Tracheostomy status  -pt requires frequent suctioning--will benefit going to LTAC  -concerned about ability to provide level of care he needs at [facility]  - Patient with a desaturation event 3/30 evening requiring suctioning.  -pulmonary toilet  Hypernatremia - TPN per pharmacy. Improving, supplemented 3/30 with D5W.  Family Communication: updated son on phone total time 35 min Disposition Plan: [name] vs [facility]          Procedures/Studies: Ct Abdomen Pelvis W Contrast  04/04/2013   CLINICAL DATA:  Liver flight non.   Leukocytosis.  EXAM: CT ABDOMEN AND PELVIS WITH CONTRAST  TECHNIQUE: Multidetector CT imaging of the abdomen and pelvis was performed using the standard protocol following bolus administration of intravenous contrast.  CONTRAST:  90mL OMNIPAQUE IOHEXOL 300 MG/ML  SOLN  COMPARISON:  DG ABD PORTABLE 1V dated 04/04/2013; CT ABD/PELVIS W CM dated 03/28/2013; CT ABD-PELV W/O CM dated 03/11/2013; CT ABD/PELVIS W CM dated 01/30/2013  FINDINGS: Stable moderate left and small right pleural effusions with associated passive atelectasis. Mild cardiomegaly. Postoperative findings along the gastroesophageal junction.  Slightly reduce marginal definition of the infiltrative hypodense process involving the right hepatic lobe and portions of segment 4 of the liver.  The spleen, adrenal glands, and pancreas unremarkable. Small amount of fluid along the gallbladder fossa and along the inferior edge of the right hepatic lobe could, similar to prior. Gallbladder surgically absent.  There is contrast medium in the renal collecting systems on the initial portal venous phase images, likely due to inadvertent early injection.  Stable renal hypodensities. Infrarenal IVC filter. Continued fluid along the inferior margin of the laparotomy wound. Trace fluid in the lower omentum. Stable trace presacral edema.  Bilateral chronic pars defects at L5 observed with 9 mm of anterolisthesis.  Orally administered contrast extends through to the rectum. Mild circumferential rectal wall thickening.  The patient seems to have had right hemicolectomy, with reanastomosis to the small bowel on image 39 of series 5. Outpouching from the proximal terminal margin of the remaining colon shown on images 32 through 21 of series 5, potentially some type of postoperative diverticulum or contained leak along the stable site.  IMPRESSION: 1. Similar distribution but reduced conspicuity of the peripheral infiltrative hepatic hypodensity affecting the right hepatic lobe an  adjacent portion of segment 4. Fatty infiltration is the most common cause for and infiltrative hypodensity of this type, although localized parenchymal inflammation is not entirely excluded. MRI could differentiate if clinically warranted. 2. Small amount of perihepatic ascites, similar to prior. 3. Hypodense renal lesions, similar to prior. 4. Patient appears to have had right hemicolectomy with small bowel reanastomosis. There is a collection of gas and contrast extending to the right of the proximal terminal margin of the remaining colon, potentially a small contained leak along the staple line. 5. Continued fluid along the inferior margin of the laparotomy wound, but without internal gas density currently.   Electronically Signed   By: Herbie Baltimore M.D.   On: 04/04/2013 18:40   Ct Abdomen Pelvis W Contrast  03/28/2013   CLINICAL DATA:  Abdominal distention.  EXAM: CT ABDOMEN AND PELVIS WITH CONTRAST  TECHNIQUE: Multidetector CT imaging of the abdomen and pelvis was performed using the standard protocol following bolus administration of intravenous contrast.  CONTRAST:  OMNIPAQUE IOHEXOL 300 MG/ML  SOLN  COMPARISON:  DG ABD ACUTE W/CHEST dated 03/28/2013; CT ABD-PELV W/O CM dated 03/11/2013; CT ABD/PELVIS W CM dated 01/30/2013  FINDINGS: There is a new lucency is again noted in the liver. This remains unchanged in appearance and may be infectious in etiology. No clearcut fluid collection in or air collection is noted in the liver to suggest a definite abscess. These changes may be related to phlegmon. Close follow up to evaluate for developing abscess suggested. Hepatic veins patent. Splenic and portal veins patent. No focal significant splenic abnormality. Pancreas normal. Cholecystectomy.  No biliary distention.  Adrenals normal. Stable approximate 13 mm low-density lesion in the left kidney, not definite simple cyst. As noted on prior study, MRI of the kidneys suggest for further evaluation. No  hydronephrosis. The bladder is nondistended. No free pelvic fluid. Prostate is not enlarged. Calcifications within the prostate. No significant adenopathy. Abdominal aorta is atherosclerotic. No aneurysm. Visceral vessels are patent. Inferior vena caval filter noted in the IVC with tip just below the renal veins.  Previously identified drainage catheter in the right upper quadrant has been removed. Inflammatory changes are noted in the right mid abdomen, no abscess noted. The patient has had a prior hemicolectomy. Reference is made to CT report of 01/31/2012 which discusses fistula changes within the abdomen. Patient status post right hemicolectomy. The colon is moderately distended and contains fluid levels. These changes have improved from prior exam suggesting improving ileus. These changes may be related to a diarrheal illness. Follow-up abdominal series suggested. No small bowel distention. The stomach is nondistended. No free air noted.  Atelectasis and/or infiltrates in the lung bases. Bilateral pleural effusions. Cardiomegaly. Coronary artery disease. Midline abdominal scar noted. Fluid noted in the region of the scar with associated small amount of air. Developing abscess should be considered. These findings have progressed slightly from prior study. Multiple anterior abdominal wall subcutaneous nodules most likely injection granulomas.  IMPRESSION: 1. Findings consistent with persistent phlegmon in the liver. 2. Interim removal of right upper quadrant drainage catheter. 3. Colonic distention with air-fluid levels. These findings have improved from prior study suggesting improving adynamic ileus or diarrheal illness. Followup abdominal series suggested. 4. Slight progression of soft tissue fluid in the anterior abdomen in the region the patient's scarring. This could represent developing phlegmon/abscess. Small amount of air is noted within this fluid collection. 5. 13 mm low-density lesion left kidney, not  definite simple cyst. As noted on prior study nonenhanced and enhanced MRI of the kidneys suggested for further evaluation.   Electronically Signed   By: Maisie Fus  Register   On: 03/28/2013 23:29   Dg Chest Port 1 View  04/10/2013   CLINICAL DATA:  Cough and congestion.  EXAM: PORTABLE CHEST - 1 VIEW  COMPARISON:  DG CHEST 1V PORT dated 04/03/2013  FINDINGS: Tracheostomy and left-sided PICC line positioning are stable. The right jugular central line has been removed since the prior chest x-ray. Lungs show lower volumes bilaterally with increased prominence of bilateral lower lobe atelectasis. There may be a component of left-sided pleural fluid. The heart remains moderately enlarged with evidence of prior placement of a left atrial appendage clip. No overt pulmonary edema is identified.  IMPRESSION: Lower volumes with increased prominence of bilateral lower lobe atelectasis. There may be a component of left-sided pleural fluid.   Electronically Signed   By: Irish Lack M.D.   On: 04/10/2013 11:29   Dg Chest Port 1 View  04/03/2013   CLINICAL DATA:  Central venous catheter placement  EXAM: PORTABLE CHEST - 1 VIEW  COMPARISON:  DG CHEST 1V PORT dated 04/01/2013  FINDINGS: Left upper extremity PICC is been placed. Tip is in the lower SVC. Tracheostomy tube and left atrial appendage clip stable. Left pleural effusion and basilar consolidation stable. Vascular congestion increased.  IMPRESSION: Left PICC placed with its tip at the lower SVC.  Increased vascular congestion.  Stable left basilar consolidation and left pleural effusion.   Electronically Signed   By: Maryclare Bean M.D.   On: 04/03/2013 09:04   Dg Chest  Port 1 View  04/01/2013   CLINICAL DATA:  Leukocytosis  EXAM: PORTABLE CHEST - 1 VIEW  COMPARISON:  03/28/2013  FINDINGS: Postsurgical changes are again seen. A right-sided jugular central line is noted in the right innominate vein stable from the prior exam. The cardiac shadow is stable. Left basilar  consolidation with increasing effusion is noted.  IMPRESSION: Increasing left basilar infiltrate and effusion.   Electronically Signed   By: Alcide Clever M.D.   On: 04/01/2013 09:10   Dg Abd 2 Views  03/30/2013   CLINICAL DATA:  Colonic distention.  Multiple abdominal surgeries.  EXAM: ABDOMEN - 2 VIEW  COMPARISON:  DG ABD 2 VIEWS dated 03/29/2013; CT ABD/PELVIS W CM dated 03/28/2013  FINDINGS: Trace blunting of the right costophrenic angle. No free intraperitoneal gas.  Dilated loops of left upper quadrant small bowel measure up to 4 cm, similar to the prior exam. There are several air-fluid levels within these small bowel loops. Reduced colonic gas compared to the prior exam. IVC filter noted.  IMPRESSION: 1. Mildly dilated loops of left upper quadrant small bowel with scattered internal air-fluid levels, similar to the prior exam, with abnormal but nonspecific pattern which could be due to could ileus or partial small bowel obstruction. Correlate with bowel sounds and bowel function.   Electronically Signed   By: Herbie Baltimore M.D.   On: 03/30/2013 10:43   Dg Abd 2 Views  03/29/2013   CLINICAL DATA:  Lower abdominal pain. Nausea. Shortness of breath. Weakness.  EXAM: ABDOMEN - 2 VIEW  COMPARISON:  Abdominal radiograph 03/28/2013.  FINDINGS: Some colonic gas is noted. However, there are multiple borderline dilated of mildly dilated loops of gas-filled small bowel measuring up to 4.2 cm in diameter throughout the central abdomen and left side of the abdomen. No pneumoperitoneum appreciated on the left lateral decubitus view. Several small air-fluid levels are noted. Residual iodinated contrast material is noted within the lumen of the urinary bladder related to yesterday's CT examination. Numerous surgical clips are noted in the right upper quadrant of the abdomen. IVC filter projecting over either the right side at L2-L3.  IMPRESSION: 1. Nonspecific bowel gas pattern, as above, suggestive of partial small  bowel obstruction. 2. No pneumoperitoneum.   Electronically Signed   By: Trudie Reed M.D.   On: 03/29/2013 13:38   Dg Abd Acute W/chest  03/28/2013   CLINICAL DATA:  Evaluate for a bowel obstruction.  EXAM: ACUTE ABDOMEN SERIES (ABDOMEN 2 VIEW & CHEST 1 VIEW)  COMPARISON:  None.  FINDINGS: Tracheostomy tube tip is above the carina. Heart size is mildly enlarged. The lung volumes are low. There is asymmetric elevation of the left hemidiaphragm. Left pleural effusion is noted.  Patient has an IVC filter. There are persistent abnormally dilated loops of small bowel which measure up to 5.5 cm. Gas is noted within the colon and rectum. There is no evidence of dilated bowel loops or free intraperitoneal air. No radiopaque calculi or other significant radiographic abnormality is seen. Heart size and mediastinal contours are within normal limits. Both lungs are clear.  IMPRESSION: 1. Persistent small bowel dilatation compatible with either partial small bowel obstruction or ileus. 2. No change in aeration to the lungs compared with previous exam   Electronically Signed   By: Signa Kell M.D.   On: 03/28/2013 18:56   Dg Abd Portable 1v  04/07/2013   CLINICAL DATA:  Abdominal pain.  EXAM: PORTABLE ABDOMEN - 1 VIEW  COMPARISON:  None.  FINDINGS: The bowel gas pattern is normal. IVC filter seen at the level of L3. Surgical clips from prior cholecystectomy noted. No radio-opaque calculi or other significant radiographic abnormality are seen.  IMPRESSION: No acute findings.  Unremarkable bowel gas pattern.   Electronically Signed   By: Myles Rosenthal M.D.   On: 04/07/2013 15:26   Dg Abd Portable 1v  04/04/2013   CLINICAL DATA:  Evaluate barium  EXAM: PORTABLE ABDOMEN - 1 VIEW  COMPARISON:  03/30/2013  FINDINGS: Contrast material is noted within the transverse colon and splenic flexure of the colon. Small bowel loop distention has improved. Slight prominence of left abdominal small bowel loops persists. No free air.  No organomegaly. Prior cholecystectomy.  IMPRESSION: Improving small bowel distention. Small amount of oral contrast material within the transverse colon and splenic flexure of the colon.   Electronically Signed   By: Charlett Nose M.D.   On: 04/04/2013 10:18   Dg Swallowing Func-speech Pathology  04/11/2013   Riley Nearing Deblois, CCC-SLP     04/11/2013  3:10 PM Objective Swallowing Evaluation: Modified Barium Swallowing Study   Patient Details  Name: Tim Short MRN: [id number] Date of Birth: 08/12/1934  Today's Date: 04/11/2013 Time: 4098-1191 SLP Time Calculation (min): 25 min  Past Medical History:  Past Medical History  Diagnosis Date  . Hypertension   . Hypercholesteremia   . Asthma   . Meniere disease   . Persistent atrial fibrillation     a. s/p multiple dccv's;  b. failed amio;  c. not felt to be AF  RFCA canddiate due to LA dil;  d. s/p failed hybrid ablation @  [facility] in 09/2012;  e. chronic pradaxa.  . Obesity   . Biatrial enlargement     LA size 5.3cm  . Obstructive sleep apnea     AHI 108/hr now on CPAP at 12cm H2O  . H/O hiatal hernia   . GERD (gastroesophageal reflux disease)     "associated w/hiatal hernia" (Jun 05, 2012)  . Peptic ulcer 1950's  . Migraines     "haven't had one for about 15 years" (Jun 05, 2012)  . Arthritis     "joints" (Jun 05, 2012)  . Chronic lower back pain   . Nephrolithiasis ~ 1960's    "passed on their own" (2012-06-05)  . History of pneumonia 2002; 2006    "spent 8 days in isolation; Norovirus" (06/05/2012)  . Other and unspecified angina pectoris   . RLS (restless legs syndrome)   . Coronary artery disease     a. 05/2012 Cath/PCI: LM nl, LAD nl, LCX 35m (3.0x15 Integrity  BMS), PTCA of OM1 through stent struts (kissing balloon).  . Diabetes mellitus without complication     FAMILY STATES PATIENT IS NOT DIABETIC   Past Surgical History:  Past Surgical History  Procedure Laterality Date  . Wrist fracture surgery  1992    "repaired w/left hip bone graft" (06/05/12)  . Total knee  arthroplasty  08/19/1999  . Colonoscopy w/ polypectomy  2006  . Knee arthroscopy with meniscal repair Right 1974    "medial meniscus repaired" (Jun 05, 2012)  . Cardioversion  10/02/2011    Procedure: CARDIOVERSION;  Surgeon: Corky Crafts, MD;   Location: [facility] ENDOSCOPY;  Service: Cardiovascular;  Laterality:  N/A;  h/p in file drawer  . Cardioversion  11/07/2011    Procedure: CARDIOVERSION;  Surgeon: Corky Crafts, MD;   Location: [facility] ENDOSCOPY;  Service: Cardiovascular;  Laterality:  N/A;  h/p from 10/22 in file drawer/dl  .  Cardiac catheterization  05/26/2012  . Coronary angioplasty with stent placement  06/04/2012    "1" (06/04/2012)  . Cataract extraction w/ intraocular lens  implant, bilateral   2009  . Hemorrhoid surgery  ~ 2003  . Hiatal hernia repair  1983  . Nissen fundoplication  1983  . Ablation of dysrhythmic focus  SEPT/OCT 2014    ATRIAL FIB  . Inguinal hernia repair Bilateral 2000 and 2005    "one done at a time" (06/04/2012)  . Colon surgery  10/08/2012   . 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 Pt was in  prolonged respiratory failure at [facility], unable to wean of the vent  and requiring trach placement 10/29/2012. He was transferred to  [facility] in December 2014 and later to [facility] in late January  2015. He was on TNA and was doing fairly well initially, but  developed more abdominal pain and on 2/23 taken to OR in [place]  for gallbladder removal (secondary to cholecystitis), resection  for part of the colon for fistula, IVC filter placement. Sent  back to [facility] on march 4th, 2015 and started on TPN. Diet  advanced on March 9th, 2015 after pt passed swallow evaluation.  He was brought to [facility] March 16th, after  noticing progressive  abdominal distension. In ED, CT abdomen with air- fluid level and  findings consistent with early partial SBO, ileus. Surgery  reports pt is ok for PO from their standpoint, SLP ordered for  swallow eval.      Assessment / Plan / Recommendation Clinical Impression  Dysphagia Diagnosis: Moderate pharyngeal phase dysphagia Clinical impression: Pt demonstrates significant improvement in  motor function since last MBS. Pts primary decifits are now  exclusively sensory in nature. Pt has a delay in swallow  initiation with nectar and thin teaspoon amounts with aspiration  before the swallow. With a chin tuck, the pt is albe to protect  the airway before the swallow with nectar vea teaspoon or straw.  Pt is also able to masticate soft and regular textured solids. He  was noted to have expectoration of mucous/barium after the test,  possible esophageal component not visualized due to body habitus.  Pt is recommended to upgrade to a dys 2 Finely chopped diet with  nectar thick liquids via spoon or straws with full supervison for  a chin tuck and placement of PMSV. Pt may upgrade solids if he  tolerates dys 2 diet.     Treatment Recommendation  Therapy as outlined in treatment plan below    Diet Recommendation Dysphagia 2 (Fine chop);Nectar-thick liquid   Liquid Administration via: Spoon;Straw Medication Administration: Crushed with puree Supervision: Patient able to self feed;Full supervision/cueing  for compensatory strategies Compensations: Slow rate;Small sips/bites Postural Changes and/or Swallow Maneuvers: Chin tuck;Seated  upright 90 degrees    Other  Recommendations Oral Care Recommendations: Oral care BID Other Recommendations: Place PMSV during PO intake;Order  thickener from pharmacy;Have oral suction available   Follow Up Recommendations  Skilled Nursing facility    Frequency and Duration min 2x/week  2 weeks   Pertinent Vitals/Pain NA    SLP Swallow Goals     General HPI: Pt is 79 yo male  with fairly recent hospitalization  at  [facility] in September 2014 for failed maize procedure and during  that hospitalization pt developed volvulus and has required right  colectomy (10/08/2012), subsequently 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 Pt was in  prolonged respiratory failure at [facility], unable to wean of the vent  and requiring trach placement 10/29/2012. He was transferred to  [facility] in December 2014 and later to [facility] in late January  2015. He was on TNA and was doing fairly well initially, but  developed more abdominal pain and on 2/23 taken to OR in [place]  for gallbladder removal (secondary to cholecystitis), resection  for part of the colon for fistula, IVC filter placement. Sent  back to [facility] on march 4th, 2015 and started on TPN. Diet  advanced on March 9th, 2015 after pt passed swallow evaluation.  He was brought to [facility] March 16th, after noticing progressive  abdominal distension. In ED, CT abdomen with air- fluid level and  findings consistent with early partial SBO, ileus. Surgery  reports pt is ok for PO from their standpoint, SLP ordered for  swallow eval.  Type of Study: Modified Barium Swallowing Study Reason for Referral: Objectively evaluate swallowing function Previous Swallow Assessment: [facility] hospital - 3/16 pt "passed"  FEES and was started on diet, began to decline and blue dye test  was given, pt silently aspirated thin, nectar, honey per report.  Diet Prior to this Study: Dysphagia 1 (puree);Pudding-thick  liquids Temperature Spikes Noted: No Respiratory Status: Trach collar Trach Size and Type: Cuff;Deflated;With PMSV in place;#6;Other  (Comment) History of Recent Intubation: No Behavior/Cognition: Alert;Cooperative;Requires cueing;Hard of  hearing Oral Cavity - Dentition: Missing dentition Oral Motor / Sensory Function: Impaired - see Bedside swallow  eval Self-Feeding Abilities: Able to feed self;Needs assist Patient Positioning: Upright  in chair Baseline Vocal Quality: Low vocal intensity Volitional Cough: Strong Volitional Swallow: Able to elicit Anatomy: Within functional limits Pharyngeal Secretions: Not observed secondary MBS    Reason for Referral Objectively evaluate swallowing function   Oral Phase Oral Preparation/Oral Phase Oral Phase: Impaired Oral Phase - Comment Oral Phase - Comment: missing dentition, but places boluse  effectively between remaining teeth. otherwise WFL.    Pharyngeal Phase Pharyngeal Phase Pharyngeal Phase: Impaired Pharyngeal - Honey Pharyngeal - Honey Teaspoon: Not tested Pharyngeal - Honey Cup: Not tested Pharyngeal - Nectar Pharyngeal - Nectar Teaspoon: Delayed swallow  initiation;Penetration/Aspiration before swallow (chin tuck  increases airway closure, preents penetration) Penetration/Aspiration details (nectar teaspoon): Material enters  airway, passes BELOW cords without attempt by patient to eject  out (silent aspiration);Material does not enter airway Pharyngeal - Nectar Straw: Delayed swallow initiation (chin tuck) Pharyngeal - Thin Pharyngeal - Thin Teaspoon: Delayed swallow  initiation;Penetration/Aspiration before swallow (chin tuck) Penetration/Aspiration details (thin teaspoon): Material enters  airway, CONTACTS cords and not ejected out Pharyngeal - Solids Pharyngeal - Puree: Delayed swallow initiation (chin tuck) Pharyngeal - Mechanical Soft: Delayed swallow initiation Pharyngeal - Regular: Delayed swallow initiation  Cervical Esophageal Phase    GO             Harlon Ditty, MA CCC-SLP [phone]  Claudine Mouton 04/11/2013, 3:08 PM    Dg Swallowing Func-speech Pathology  04/02/2013   Breck Coons [place], CCC-SLP     04/02/2013  2:03 PM Objective Swallowing Evaluation: Modified Barium Swallowing Study   Patient Details  Name: SEABRON IANNELLO MRN: [id number] Date of Birth: 03/14/1934  Today's Date: 04/02/2013 Time: 1225-1250 SLP Time Calculation (min): 25 min  Past Medical History:  Past Medical  History  Diagnosis Date  . Hypertension   . Hypercholesteremia   . Asthma   . Meniere disease   . Persistent atrial fibrillation     a. s/p multiple dccv's;  b. failed amio;  c. not felt to be AF  RFCA canddiate due to LA dil;  d. s/p failed hybrid ablation @  [facility] in 09/2012;  e. chronic pradaxa.  . Obesity   . Biatrial enlargement     LA size 5.3cm  . Obstructive sleep apnea     AHI 108/hr now on CPAP at 12cm H2O  . H/O hiatal hernia   . GERD (gastroesophageal reflux disease)     "associated w/hiatal hernia" (2012-06-05)  . Peptic ulcer 1950's  . Migraines     "haven't had one for about 15 years" (06-05-2012)  . Arthritis     "joints" (06/05/12)  . Chronic lower back pain   . Nephrolithiasis ~ 1960's    "passed on their own" (05-Jun-2012)  . History of pneumonia 2002; 2006    "spent 8 days in isolation; Norovirus" (06/05/12)  . Other and unspecified angina pectoris   . RLS (restless legs syndrome)   . Coronary artery disease     a. 05/2012 Cath/PCI: LM nl, LAD nl, LCX 45m (3.0x15 Integrity  BMS), PTCA of OM1 through stent struts (kissing balloon).  . Diabetes mellitus without complication     FAMILY STATES PATIENT IS NOT DIABETIC   Past Surgical History:  Past Surgical History  Procedure Laterality Date  . Wrist fracture surgery  1992    "repaired w/left hip bone graft" (06-05-2012)  . Total knee arthroplasty  08/19/1999  . Colonoscopy w/ polypectomy  2006  . Knee arthroscopy with meniscal repair Right 1974    "medial meniscus repaired" (06/05/2012)  . Cardioversion  10/02/2011    Procedure: CARDIOVERSION;  Surgeon: Corky Crafts, MD;   Location: [facility] ENDOSCOPY;  Service: Cardiovascular;  Laterality:  N/A;  h/p in file drawer  . Cardioversion  11/07/2011    Procedure: CARDIOVERSION;  Surgeon: Corky Crafts, MD;   Location: [facility] ENDOSCOPY;  Service: Cardiovascular;  Laterality:  N/A;  h/p from 10/22 in file drawer/dl  . Cardiac catheterization  05/26/2012  . Coronary angioplasty with stent placement  06/05/12     "1" (05-Jun-2012)  . Cataract extraction w/ intraocular lens  implant, bilateral   2009  . Hemorrhoid surgery  ~ 2003  . Hiatal hernia repair  1983  . Nissen fundoplication  1983  . Ablation of dysrhythmic focus  SEPT/OCT 2014    ATRIAL FIB  . Inguinal hernia repair Bilateral 2000 and 2005    "one done at a time" (06-05-12)  . Colon surgery  10/08/2012   . 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 Pt was in  prolonged respiratory failure at [facility], unable to wean of the vent  and requiring trach placement 10/29/2012. He was transferred to  [facility] in December 2014 and later to [facility] in late January  2015. He was on TNA and was doing fairly well initially, but  developed more abdominal pain and on 2/23 taken to OR in [place]  for gallbladder removal (secondary to cholecystitis), resection  for part of the  colon for fistula, IVC filter placement. Sent  back to [facility] on march 4th, 2015 and started on TPN. Diet  advanced on March 9th, 2015 after pt passed swallow evaluation.  [facility], after noticing progressive  abdominal distension. In ED, CT abdomen with air- fluid level and  findings consistent with early partial SBO, ileus. Surgery  reports pt is ok for PO from their standpoint, SLP ordered for  swallow eval.      Assessment / Plan / Recommendation Clinical Impression  Dysphagia Diagnosis: Moderate pharyngeal phase dysphagia;Severe  pharyngeal phase dysphagia Clinical impression: MBS completed with pt's PMSV donned and  appears to be congruent with study performed at [facility].  He  demonstrated moderate-severe motor based pharyngeal dyspahgia as  evidenced by reduced tongue base retraction, decreased laryngeal   elevation and decreased pharyngeal contraction.  Impairments led  to mild pharyngeal residue in vallecule/pyriform sinuses and  posterior pharyngeal wall and laryngeal vestibule consistently  silent penetration to vocal cords.  Verbal cues for hard cough  briefly cleared vestibule with subsequent penetration from  residue.  SLP recommends Dys 1 (puree) only, no liquids (pudding  thick liquid) with speaking valve donned, two swallows,  volitional coughs, pills crushed in applesauce and full  supervision/assist.       Treatment Recommendation  Therapy as outlined in treatment plan below    Diet Recommendation Dysphagia 1 (Puree);Pudding-thick liquid   Medication Administration: Crushed with puree Supervision: Patient able to self feed;Full supervision/cueing  for compensatory strategies Compensations: Slow rate;Small sips/bites;Multiple dry swallows  after each bite/sip;Hard cough after swallow Postural Changes and/or Swallow Maneuvers: Seated upright 90  degrees;Upright 30-60 min after meal, wear speaking valve during  all po's    Other  Recommendations Oral Care Recommendations: Oral care BID Other Recommendations: Order thickener from pharmacy   Follow Up Recommendations  Skilled Nursing facility    Frequency and Duration min 2x/week  2 weeks   Pertinent Vitals/Pain WDL            Reason for Referral Objectively evaluate swallowing function   Oral Phase Oral Preparation/Oral Phase Oral Phase: WFL (WFL for textures assessed)   Pharyngeal Phase Pharyngeal Phase Pharyngeal Phase: Impaired Pharyngeal - Honey Pharyngeal - Honey Teaspoon: Reduced pharyngeal  peristalsis;Pharyngeal residue - valleculae;Reduced tongue base  retraction;Penetration/Aspiration during swallow;Pharyngeal  residue - pyriform sinuses;Reduced laryngeal elevation Penetration/Aspiration details (honey teaspoon): Material enters  airway, CONTACTS cords and not ejected out Pharyngeal - Honey Cup: Penetration/Aspiration during  swallow;Pharyngeal  residue - valleculae;Pharyngeal residue -  pyriform sinuses;Reduced tongue base retraction;Reduced laryngeal  elevation Penetration/Aspiration details (honey cup): Material enters  airway, remains ABOVE vocal cords then ejected out Pharyngeal - Solids Pharyngeal - Puree: Pharyngeal residue - valleculae;Reduced  tongue base retraction;Pharyngeal residue - pyriform  sinuses;Reduced laryngeal elevation  Cervical Esophageal Phase        Cervical Esophageal Phase Cervical Esophageal Phase: [facility]         Darrow BussingLisa Willis Litaker M.Ed CCC-SLP Pager [phone]  04/02/2013         Subjective: Patient states that he is not breathing any worse than usual but continues to have some shortness of breath chronically. He denies any fevers, chills, chest discomfort, nausea, vomiting.he has loose stools without hematochezia. He has chronic abdominal pain which is unchanged.   Objective: Filed Vitals:   04/15/13 2356 04/16/13 0335 04/16/13 0535 04/16/13 1100  BP:   121/79   Pulse: 112 98 125 116  Temp:   98.6 F (37  C)   TempSrc:   Oral   Resp: 20 24 20 20   Height:      Weight:   89.812 kg (198 lb)   SpO2: 95% 93% 94% 96%    Intake/Output Summary (Last 24 hours) at 04/16/13 1156 Last data filed at 04/16/13 0538  Gross per 24 hour  Intake     25 ml  Output    700 ml  Net   -675 ml   Weight change: -0.907 kg (-2 lb) Exam:   General:  Pt is alert, follows commands appropriately, not in acute distress  HEENT: No icterus, No thrush,  [place]/AT  Cardiovascular: RRR, S1/S2, no rubs, no gallops  Respiratory: Scattered bilateral rales.. No wheezing.  Abdomen: Soft/+BS, non tender, non distended, no guarding  Extremities: trace LE edema, No lymphangitis, No petechiae, No rashes, no synovitis  Data Reviewed: Basic Metabolic Panel:  Recent Labs Lab 04/10/13 0530 04/11/13 0456  04/12/13 0525 04/13/13 0605 04/14/13 0530 04/15/13 0530 04/16/13 0512  NA 151* 152*  < > 149* 148* 148* 144 145  K 3.4* 3.6*   < > 3.3* 3.3* 3.6* 3.3* 3.6*  CL 114* 114*  < > 111 110 109 105 106  CO2 26 26  < > 29 27 28 27 28   GLUCOSE 206* 221*  < > 183* 181* 168* 158* 171*  BUN 25* 25*  < > 24* 21 19 16 16   CREATININE 0.69 0.57  < > 0.59 0.59 0.57 0.59 0.62  CALCIUM 8.7 8.8  < > 8.8 8.5 8.6 8.3* 8.4  MG 2.2 2.2  --   --   --  2.1 2.0 2.0  PHOS 2.8 3.1  --   --   --  3.5 3.5 3.5  < > = values in this interval not displayed. Liver Function Tests:  Recent Labs Lab 04/10/13 0530 04/11/13 0456 04/12/13 0525 04/14/13 0530 04/16/13 0512  AST 27 19 20  79* 56*  ALT 38 33 27 69* 69*  ALKPHOS 63 68 64 76 80  BILITOT 0.3 0.4 0.4 0.3 0.3  PROT 4.9* 5.1* 4.9* 5.0* 4.9*  ALBUMIN 2.3* 2.3* 2.1* 2.2* 2.1*   No results found for this basename: LIPASE, AMYLASE,  in the last 168 hours No results found for this basename: AMMONIA,  in the last 168 hours CBC:  Recent Labs Lab 04/10/13 0530 04/11/13 0456 04/12/13 0525 04/13/13 0605 04/15/13 0530  WBC 9.9 10.3 8.9 7.0 7.5  NEUTROABS  --  8.6*  --   --  5.5  HGB 8.4* 8.7* 8.5* 8.5* 8.6*  HCT 27.7* 28.5* 28.4* 27.6* 28.0*  MCV 98.9 98.6 99.3 96.8 96.9  PLT 114* 114* 100* 105* 108*   Cardiac Enzymes: No results found for this basename: CKTOTAL, CKMB, CKMBINDEX, TROPONINI,  in the last 168 hours BNP: No components found with this basename: POCBNP,  CBG:  Recent Labs Lab 04/15/13 1135 04/15/13 1622 04/15/13 2122 04/16/13 0547 04/16/13 1124  GLUCAP 146* 176* 144* 166* 145*    Recent Results (from the past 240 hour(s))  CLOSTRIDIUM DIFFICILE BY PCR     Status: None   Collection Time    04/10/13  9:03 AM      Result Value Ref Range Status   C difficile by pcr NEGATIVE  NEGATIVE Final  URINE CULTURE     Status: None   Collection Time    04/13/13  2:31 PM      Result Value Ref Range Status   Specimen Description URINE, RANDOM  Final   Special Requests NONE   Final   Culture  Setup Time     Final   Value: 04/13/2013 18:52     Performed at [facility] Count     Final   Value: 8,000 COLONIES/ML     Performed at [facility]   Culture     Final   Value: INSIGNIFICANT GROWTH     Performed at [facility]   Report Status 04/14/2013 FINAL   Final     Scheduled Meds: . amiodarone  200 mg Oral BID  . atorvastatin  40 mg Oral Daily  . chlorhexidine  15 mL Mouth/Throat BID  . diltiazem  240 mg Oral BID  . feeding supplement (ENSURE)  1 Container Oral TID BM  . hydrocortisone cream   Topical BID  . insulin aspart  0-9 Units Subcutaneous TID WC  . pantoprazole  40 mg Oral Daily  . predniSONE  5 mg Oral Q breakfast  . roflumilast  500 mcg Oral Daily  . sodium chloride  10-40 mL Intracatheter Q12H   Continuous Infusions: . sodium chloride    . dextrose 5 % and 0.45 % NaCl with KCl 40 mEq/L 50 mL/hr at 04/16/13 0014  . Marland KitchenTPN (CLINIMIX-E) Adult 60 mL/hr at 04/15/13 1714   And  . fat emulsion 250 mL (04/15/13 1714)  . Marland KitchenTPN (CLINIMIX-E) Adult     And  . fat emulsion       Keshayla Schrum, DO  [facility] Pager [phone]  If 7PM-7AM, please contact night-coverage [email or web address] Password [email or web address] 04/16/2013, 11:56 AM   LOS: 19 days

## 2013-04-16 NOTE — Progress Notes (Signed)
PARENTERAL NUTRITION CONSULT NOTE - FOLLOW UP  Pharmacy Consult for TPN Indication: inadequate PO intake, resolving SBO  Allergies  Allergen Reactions  . Corticosteroids     Inhaled Corticosteroids--Hoarseness, dry mouth  . Furosemide Other (See Comments)    Ototoxicity     Patient Measurements: Height: 5' 8.9" (175 cm) Weight: 198 lb (89.812 kg) IBW/kg (Calculated) : 70.47   Vital Signs: Temp: 98.6 F (37 C) (04/04 0535) Temp src: Oral (04/04 0535) BP: 121/79 mmHg (04/04 0535) Pulse Rate: 125 (04/04 0535) Intake/Output from previous day: 04/03 0701 - 04/04 0700 In: 2424.7 [P.O.:25; I.V.:2336.7; TPN:63] Out: 700 [Urine:700] Intake/Output from this shift:    Labs:  Recent Labs  04/15/13 0530  WBC 7.5  HGB 8.6*  HCT 28.0*  PLT 108*     Recent Labs  04/14/13 0530 04/15/13 0530 04/16/13 0512  NA 148* 144 145  K 3.6* 3.3* 3.6*  CL 109 105 106  CO2 28 27 28   GLUCOSE 168* 158* 171*  BUN 19 16 16   CREATININE 0.57 0.59 0.62  CALCIUM 8.6 8.3* 8.4  MG 2.1 2.0 2.0  PHOS 3.5 3.5 3.5  PROT 5.0*  --  4.9*  ALBUMIN 2.2*  --  2.1*  AST 79*  --  56*  ALT 69*  --  69*  ALKPHOS 76  --  80  BILITOT 0.3  --  0.3   Estimated Creatinine Clearance: 85.5 ml/min (by C-G formula based on Cr of 0.62).    Recent Labs  04/15/13 1622 04/15/13 2122 04/16/13 0547  GLUCAP 176* 144* 166*    Insulin Requirements in the past 24 hours:  2 units of Novolog + 15 units regular insulin in TPN bag  Current Nutrition:  Dysphagia 2 diet with poor PO intake documented (0-25%) Ensure pudding po TID- each supplement provides 170kcal and 4g protein Clinimix E 5/15 to 60 ml/hr + 20% lipid emulsion at 10 ml/hr (provides 1502kCal ~72% of goal and 72g protein ~65% of goal per day)   Nutritional Goals:  2050-2250 kCal, 110-120 grams of protein per day per RD 4/1  Admit: Transferred from [facility] with abdominal pain, partial SBO, ileus.  GI: Hx GERD, PUD - resolved ileus per  surgery.  Fall 2014 - volvulus, multiple abd surgeries, fistula development, was on TPN long-term but recently stopped and was tolerating PO intake per report. Now with poor PO intake. Now on Dysphagia 2 diet w/ no N/V and good bowel movement. Per MD will use TPN for support since not planning to place PEG due to anticoagulation (Xarelto). Please see RD note 3/27: recs for short-term enteral support via NGT is appropriate as he has a functioning GI tract. Prealbumin 11.4 (low) on 3/30. Planning to wean TPN when pt taking adequate PO.  Endo: Hx DM (A1c 6.4) - CBGs at goal with SSI + insulin in TPN, continues on daily PO prednisone  Lytes: Na 145, K 3.6, phos and Mg WNL. Currently on D5-1/2NS with K @ 67mL/hr for hypernatremia and hypokalemia  Renal: SCr stable at 0.62, UOP 0.8ml/kg/hr  Pulm: Hx asthma, OSA - 94% 0.35 TC -  Daliresp  Cards: Hx afib, HTN, HLD, CAD - BP 121/79, HR 125 (NSR s/p cardioversion 3/27 but now in afib again) - amio, lipitor, dilt, xarelto (per EP to continue without interruption, if patient needs to have PEG placed, may need to discuss transitioning to heparin for procedure and then resuming Xarelto post-op)  Hepatobil: LFTs now trending back down, Tbili WNL, trigs  WNL- HIT panel IP  Neuro: Hx RLS, back pain, migraines - A&O  ID: Completed a 7d course of abx for HCAP 3/26 - Afebrile, WBC WNL, no abx  Best Practices: Xarelto, MC, PPI PO  TPN Access: PICC placed 3/22  TPN day#: 10  Plan:  1. Continue Clinimix E 5/15 to 60 ml/hr + 20% lipid emulsion at 10 ml/hr (provides 1502kCal (~72% of goal) and 72g protein (65% of goal) per day) - may need to go back up to goal rate depending on pts PO intake 2. Consider placing a feeding tube of some sort and feeding enterally as his gut works. TPN not appropriate nutrition support in this pt with functioning GI tract. 3. Continue SSI + 15 units insulin in TPN  4. Daily MVI; trace elements M/W/F only due to national shortage   5. Potassium chloride liquid 40meq PO x 1 6. BMET in AM  Lysle Pearlachel Geneen Dieter, PharmD, BCPS Pager # [phone] 04/16/2013 7:52 AM

## 2013-04-16 NOTE — Progress Notes (Signed)
RT suctioned Pt for moderate amt of thick white secretions. Pt tolerated well. RT cleaned around trach & changed drain sponge. Pt declined Sanmina-SCIPassey Muir valve. Pt resting comfortably at this time.

## 2013-04-16 NOTE — Progress Notes (Signed)
ANTICOAGULATION CONSULT NOTE - Initial Consult  Pharmacy Consult for convert xarelto to UFH Indication: atrial fibrillation  Allergies  Allergen Reactions  . Corticosteroids     Inhaled Corticosteroids--Hoarseness, dry mouth  . Furosemide Other (See Comments)    Ototoxicity     Patient Measurements: Height: 5' 8.9" (175 cm) Weight: 198 lb (89.812 kg) IBW/kg (Calculated) : 70.47 Heparin Dosing Weight: 78 kg  Vital Signs: Temp: 98.6 F (37 C) (04/04 0535) Temp src: Oral (04/04 0535) BP: 121/79 mmHg (04/04 0535) Pulse Rate: 116 (04/04 1100)  Labs:  Recent Labs  04/14/13 0530 04/15/13 0530 04/16/13 0512  HGB  --  8.6*  --   HCT  --  28.0*  --   PLT  --  108*  --   CREATININE 0.57 0.59 0.62    Estimated Creatinine Clearance: 85.5 ml/min (by C-G formula based on Cr of 0.62).   Medical History: Past Medical History  Diagnosis Date  . Hypertension   . Hypercholesteremia   . Asthma   . Meniere disease   . Persistent atrial fibrillation     a. s/p multiple dccv's;  b. failed amio;  c. not felt to be AF RFCA canddiate due to LA dil;  d. s/p failed hybrid ablation @ [facility] in 09/2012;  e. chronic pradaxa.  . Obesity   . Biatrial enlargement     LA size 5.3cm  . Obstructive sleep apnea     AHI 108/hr now on CPAP at 12cm H2O  . H/O hiatal hernia   . GERD (gastroesophageal reflux disease)     "associated w/hiatal hernia" (2012-06-07)  . Peptic ulcer 1950's  . Migraines     "haven't had one for about 15 years" (06-07-2012)  . Arthritis     "joints" (Jun 07, 2012)  . Chronic lower back pain   . Nephrolithiasis ~ 1960's    "passed on their own" (06/07/12)  . History of pneumonia 2002; 2006    "spent 8 days in isolation; Norovirus" (07-Jun-2012)  . Other and unspecified angina pectoris   . RLS (restless legs syndrome)   . Coronary artery disease     a. 05/2012 Cath/PCI: LM nl, LAD nl, LCX 26m (3.0x15 Integrity BMS), PTCA of OM1 through stent struts (kissing balloon).  .  Diabetes mellitus without complication     FAMILY STATES PATIENT IS NOT DIABETIC     Assessment: Tim Short is well known to pharmacy.  PTA he was on pradaxa for afib stroke prevention and this was changed to xarelto because he had trouble swallowing the pradaxa pills.  Now he will be transitioned to UFH so he can have a PEG placed Monday for enteral nutrition support.   WT 89.8 kg.  Creat 0.62.  Last dose of xarelto 20 mg 4/3 at 1646. HIT level pending from 4/3 due to thrombocytopenia.  He has NOT received any heparin products this admission.   He was on angiomax 06-08-2022 with cardiac cath.  Hg 8.6; pltc 108;   We will dose heparin based on  aPTT results until the effect of xarelto has worn off.  Xarelto causes heparin levels to elevated.   Goal of Therapy:  Heparin level 0.3-0.7 units/ml aPTT 66-102 seconds Monitor platelets by anticoagulation protocol: Yes   Plan:  1. Draw baseline aPTT, HL and INR now 2. Start heparin drip with no bolus at 1700 (time when next dose of xarelto would be due) 3. APTT 8 hr after started at 0100. 4. Daily aPTT and HL and CBC  while on heparin 5. F/u HIT panel from 4/3  Tim Short, Pharm.D. [phone] 04/16/2013 12:32 PM

## 2013-04-17 DIAGNOSIS — A0472 Enterocolitis due to Clostridium difficile, not specified as recurrent: Secondary | ICD-10-CM

## 2013-04-17 LAB — BASIC METABOLIC PANEL
BUN: 17 mg/dL (ref 6–23)
CO2: 30 mEq/L (ref 19–32)
Calcium: 8.3 mg/dL — ABNORMAL LOW (ref 8.4–10.5)
Chloride: 104 mEq/L (ref 96–112)
Creatinine, Ser: 0.66 mg/dL (ref 0.50–1.35)
Glucose, Bld: 182 mg/dL — ABNORMAL HIGH (ref 70–99)
Potassium: 4 mEq/L (ref 3.7–5.3)
Sodium: 142 mEq/L (ref 137–147)

## 2013-04-17 LAB — APTT: aPTT: 72 seconds — ABNORMAL HIGH (ref 24–37)

## 2013-04-17 LAB — CBC
HCT: 26.3 % — ABNORMAL LOW (ref 39.0–52.0)
HEMOGLOBIN: 8 g/dL — AB (ref 13.0–17.0)
MCH: 29.3 pg (ref 26.0–34.0)
MCHC: 30.4 g/dL (ref 30.0–36.0)
MCV: 96.3 fL (ref 78.0–100.0)
Platelets: 110 10*3/uL — ABNORMAL LOW (ref 150–400)
RBC: 2.73 MIL/uL — ABNORMAL LOW (ref 4.22–5.81)
RDW: 19.8 % — AB (ref 11.5–15.5)
WBC: 8.4 10*3/uL (ref 4.0–10.5)

## 2013-04-17 LAB — GLUCOSE, CAPILLARY
GLUCOSE-CAPILLARY: 150 mg/dL — AB (ref 70–99)
GLUCOSE-CAPILLARY: 174 mg/dL — AB (ref 70–99)
Glucose-Capillary: 156 mg/dL — ABNORMAL HIGH (ref 70–99)
Glucose-Capillary: 150 mg/dL — ABNORMAL HIGH (ref 70–99)

## 2013-04-17 LAB — HEPARIN LEVEL (UNFRACTIONATED): Heparin Unfractionated: 0.28 IU/mL — ABNORMAL LOW (ref 0.30–0.70)

## 2013-04-17 MED ORDER — M.V.I. ADULT IV INJ
INTRAVENOUS | Status: AC
  Administered 2013-04-17: 18:00:00 via INTRAVENOUS
  Filled 2013-04-17: qty 2000

## 2013-04-17 MED ORDER — FAT EMULSION 20 % IV EMUL
250.0000 mL | INTRAVENOUS | Status: AC
  Administered 2013-04-17: 250 mL via INTRAVENOUS
  Filled 2013-04-17: qty 250

## 2013-04-17 NOTE — Progress Notes (Signed)
Patient ID: Tim Short, male   DOB: 06/09/1934, 79 y.o.   MRN: [id number]  Percutaneous gastric tube request rec'd Seems appropriate candidate Dr Miles CostainShick has reviewed CT scan  New C diff diagnosed last pm Now on antibx Still very actively with many loose stools/daily  Will recheck in 24-48 hrs Need treated at least 3 days with antibx  Tentative G tube for 4/7 or 4/8

## 2013-04-17 NOTE — Progress Notes (Signed)
ANTICOAGULATION CONSULT NOTE -follow up Pharmacy Consult for convert xarelto to UFH for PEG placement Indication: atrial fibrillation  Allergies  Allergen Reactions  . Corticosteroids     Inhaled Corticosteroids--Hoarseness, dry mouth  . Furosemide Other (See Comments)    Ototoxicity     Patient Measurements: Height: 5' 8.9" (175 cm) Weight: 199 lb (90.266 kg) IBW/kg (Calculated) : 70.47 Heparin Dosing Weight: 78 kg  Vital Signs: Temp: 98.2 F (36.8 C) (04/05 1452) Temp src: Oral (04/05 1452) BP: 122/79 mmHg (04/05 1452) Pulse Rate: 110 (04/05 1452)  Labs:  Recent Labs  04/15/13 0530 04/16/13 0512 04/16/13 0900 04/16/13 1230 04/17/13 1100 04/17/13 1120  HGB 8.6*  --  8.3*  --   --  8.0*  HCT 28.0*  --  26.4*  --   --  26.3*  PLT 108*  --  109*  --   --  110*  APTT  --   --   --  30 72*  --   LABPROT  --   --   --  14.3  --   --   INR  --   --   --  1.13  --   --   HEPARINUNFRC  --   --   --  0.22* 0.28*  --   CREATININE 0.59 0.62  --   --   --  0.66    Estimated Creatinine Clearance: 85.8 ml/min (by C-G formula based on Cr of 0.66).   Medical History: Past Medical History  Diagnosis Date  . Hypertension   . Hypercholesteremia   . Asthma   . Meniere disease   . Persistent atrial fibrillation     a. s/p multiple dccv's;  b. failed amio;  c. not felt to be AF RFCA canddiate due to LA dil;  d. s/p failed hybrid ablation @ [facility] in 09/2012;  e. chronic pradaxa.  . Obesity   . Biatrial enlargement     LA size 5.3cm  . Obstructive sleep apnea     AHI 108/hr now on CPAP at 12cm H2O  . H/O hiatal hernia   . GERD (gastroesophageal reflux disease)     "associated w/hiatal hernia" (06/04/2012)  . Peptic ulcer 1950's  . Migraines     "haven't had one for about 15 years" (06/04/2012)  . Arthritis     "joints" (06/04/2012)  . Chronic lower back pain   . Nephrolithiasis ~ 1960's    "passed on their own" (06/04/2012)  . History of pneumonia 2002; 2006    "spent 8  days in isolation; Norovirus" (06/04/2012)  . Other and unspecified angina pectoris   . RLS (restless legs syndrome)   . Coronary artery disease     a. 05/2012 Cath/PCI: LM nl, LAD nl, LCX 8034m (3.0x15 Integrity BMS), PTCA of OM1 through stent struts (kissing balloon).  . Diabetes mellitus without complication     FAMILY STATES PATIENT IS NOT DIABETIC     Assessment: Mr. Tim Short is well known to pharmacy.  PTA he was on pradaxa for afib stroke prevention and this was changed to xarelto because he had trouble swallowing the pradaxa pills.  He was transitioned to UFH on 4/4 so he can have a PEG placed for enteral nutrition support.    Last dose of xarelto 20 mg 4/3 at 1646. HIT level pending from 4/3 due to thrombocytopenia.  He has NOT received any heparin products this admission.   He was on angiomax 5/23 with cardiac cath.  We will  dose heparin based on  aPTT results until the effect of xarelto has worn off.  Xarelto causes heparin levels to elevated.  Heparin drip was started 4/4 at 1800 with no bolus at a rate of 1100 units/hr.  Labs were ordered for 8 hrs after start of drip (0100) but were not collected until ~ 1400 today.  He has heparin running via his PICC line so labs have to be drawn as peripheral sticks. APTT on heparin at 1100 units/hr = 72 seconds - within goal of 66 - 102 seconds, but at low end.   Hg remains low at 8, pltc remains low at 110.  No bleeding reported.   Goal of Therapy:  Heparin level 0.3-0.7 units/ml aPTT 66-102 seconds Monitor platelets by anticoagulation protocol: Yes   Plan:  1. increase heparin drip to 1200 units/hr 2. Daily aPTT and HL and CBC while on heparin 3. F/u HIT panel from 4/3 - has not gotten heparin   Herby Abraham, Pharm.D. [phone] 04/17/2013 3:24 PM

## 2013-04-17 NOTE — Progress Notes (Signed)
04/18/13 0827 was called by nurse to  Check on patient due to SOB. When i arrived patient was 97% RR 24 patient stated he was not feeling well. I lavaged with 2ml saline then sx moderate amt of white thick secretion. Patient is on 8L 35% sats 95 RR 20 . Patient stated he feels better. Patient is stable. RT will continue to monitor.

## 2013-04-17 NOTE — Progress Notes (Signed)
PARENTERAL NUTRITION CONSULT NOTE - FOLLOW UP  Pharmacy Consult for TPN Indication: inadequate PO intake, resolving SBO  Allergies  Allergen Reactions  . Corticosteroids     Inhaled Corticosteroids--Hoarseness, dry mouth  . Furosemide Other (See Comments)    Ototoxicity     Patient Measurements: Height: 5' 8.9" (175 cm) Weight: 199 lb (90.266 kg) IBW/kg (Calculated) : 70.47   Vital Signs: Temp: 97.3 F (36.3 C) (04/05 0429) Temp src: Oral (04/05 0429) BP: 114/85 mmHg (04/05 1158) Pulse Rate: 96 (04/05 1158) Intake/Output from previous day: 04/04 0701 - 04/05 0700 In: 2655.3 [P.O.:70; I.V.:1675.3; TPN:910] Out: 1025 [Urine:1025] Intake/Output from this shift:    Labs:  Recent Labs  04/15/13 0530 04/16/13 0900 04/16/13 1230 04/17/13 1120  WBC 7.5 8.8  --  8.4  HGB 8.6* 8.3*  --  8.0*  HCT 28.0* 26.4*  --  26.3*  PLT 108* 109*  --  110*  APTT  --   --  30  --   INR  --   --  1.13  --      Recent Labs  04/15/13 0530 04/16/13 0512 04/17/13 1120  NA 144 145 142  K 3.3* 3.6* 4.0  CL 105 106 104  CO2 27 28 30   GLUCOSE 158* 171* 182*  BUN 16 16 17   CREATININE 0.59 0.62 0.66  CALCIUM 8.3* 8.4 8.3*  MG 2.0 2.0  --   PHOS 3.5 3.5  --   PROT  --  4.9*  --   ALBUMIN  --  2.1*  --   AST  --  56*  --   ALT  --  69*  --   ALKPHOS  --  80  --   BILITOT  --  0.3  --    Estimated Creatinine Clearance: 85.8 ml/min (by C-G formula based on Cr of 0.66).    Recent Labs  04/16/13 2150 04/17/13 0622 04/17/13 1140  GLUCAP 129* 150* 174*    Insulin Requirements in the past 24 hours:  5 units of Novolog + 15 units regular insulin in TPN bag  Current Nutrition:  Dysphagia 2 diet with poor PO intake documented (0-25%) Ensure pudding po TID- each supplement provides 170kcal and 4g protein Clinimix E 5/15 to 60 ml/hr + 20% lipid emulsion at 10 ml/hr (provides 1502kCal ~72% of goal and 72g protein ~65% of goal per day)   Nutritional Goals:  2050-2250 kCal,  110-120 grams of protein per day per RD 4/1  Admit: Transferred from [facility] with abdominal pain, partial SBO, ileus.  GI: Hx GERD, PUD - resolved ileus per surgery.  Fall 2014 - volvulus, multiple abd surgeries, fistula development, was on TPN long-term but recently stopped and was tolerating PO intake per report. Now with poor PO intake. Now on Dysphagia 2 diet w/ no N/V and good bowel movement. Please see RD note 3/27: recs for short-term enteral support via NGT is appropriate as he has a functioning GI tract. Prealbumin 11.4 (low) on 3/30. Planning for PEG tube this week.   Endo: Hx DM (A1c 6.4) - CBGs mostly with SSI + insulin in TPN, continues on daily PO prednisone  Lytes: Na 142, K 4, phos and Mg WNL. Currently on D5-1/2NS with K @ 1mL/hr for hypernatremia and hypokalemia  Renal: SCr stable at 0.66, UOP 0.17ml/kg/hr  Pulm: Hx asthma, OSA - 95% 0.35 TC -  Daliresp  Cards: Hx afib, HTN, HLD, CAD - BP 114/85, HR 96 (NSR s/p cardioversion 3/27 but  now in afib again) - amio, lipitor, dilt, heparin  Hepatobil: LFTs now trending back down, Tbili WNL, trigs WNL- HIT panel IP  Neuro: Hx RLS, back pain, migraines - A&O  ID: Completed a 7d course of abx for HCAP 3/26 - Afebrile, WBC WNL - flagyl D#1 for Cdiff  Best Practices: Heparin, MC, PPI PO  TPN Access: PICC placed 3/22  TPN day#: 11  Plan:  1. Continue Clinimix E 5/15 to 60 ml/hr + 20% lipid emulsion at 10 ml/hr (provides 1502kCal (~72% of goal) and 72g protein (65% of goal) per day) - will not advance back up to goal at this time since planning on PEG tube, hopefully tomorrow 2. Continue SSI + 15 units insulin in TPN  3. Daily MVI; trace elements M/W/F only due to national shortage  4. F/u AM labs 5. F/u PEG placement and ability to start enteral diet  Lysle Pearlachel Anari Evitt, PharmD, BCPS Pager # [phone] 04/17/2013 12:08 PM

## 2013-04-17 NOTE — Progress Notes (Signed)
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 I was unable to remove and clean. For the last three days I have suctioned frequently, replaced his drain guaze and applied hydrocortisone cream as needed for irritation. Patient tolerates suctioning well and has emergency equipment at bedside per protocol. Will continue to monitor closely. Lajuana Matteina Lauralee Waters, RN

## 2013-04-17 NOTE — Progress Notes (Signed)
PROGRESS NOTE  Tim Short ZOX:[id number] DOB: [birth year] DOA: 03/28/2013 PCP:  Duane Lope, MD  Assessment/Plan: Atrial fibrillation/flutter  - appreciate cardiology/EP following  -s/p cardioversion 3/27, restored sinus rhythm and maintained over the weekend -04/11/13 am--he is back in A fib with RVR, rates in the 130s.  -Cardiology reconsulted, appreciate input.  - continue amiodarone 200mg  bid per EP  -Defer chronotropic control to cardiology--diltiazem CD increased to 240bid  -HR improving 100-110 with addition of metoprolol tartrate  -Continue rivaroxaban  -add metoprolol tartrate 12.5 mg by mouth daily  -Discontinued rivaroxaban and started the patient on IV heparin in preparation for gastrostomy tube placement  -Consult IR for gastrostomy tube placement--tentatively planned for 04/19/2013 Clostridium difficile colitis -Flagyl started 04/16/2013 -Partly contributing to abdominal pain Abdominal pain  - secondary to ileus, partial SBO as observed on admission as well as Clostridium difficile colitis presently -ileus/SBO resolved, patient with bowel movements  -has a degree of abdominal pain which is stable.  - continue supportive care with analgesia, antiemetics as needed for symptom control.  - nutrition consulted, appreciate input,  -patient is not meeting his nutritional needs based on oral intake.  - patient with intermittent confusion last week, which is now better, and on anticoagulation for his A fib -Consult IR for gastrostomy tube placement  -TPN initiated 3/26 and that can be weaned off if po intake improves and pt improves clinically  -long discussion with son regarding risks and benefits of enteral feeding vs TPN--he is amenable to gastrostomy tube placement  -continue oral intake as tolerated, had another swallow evaluation 3/30, his diet was changed from Dys1 to Dys 2,  Moderate malnutrition  - secondary to acute on chronic illness as outlined above  - pt  tolerating well however does not seem to meet his nutritional requirements per nutrition  - on TPN, appreciate nutrition and pharmacy consults.  -Ultimately plan to switch the patient enteral feeding  Thrombocytopenia  -Suspect myelosuppression due to acute medical illness--stable, slowly improving -Check serum B12--558  -RBC folate--950  -Peripheral smear--no schistocytes  -Fibrinogen--355  -HIT panel--results pending  -INR 1.14  Leukocytosis  - likely secondary to ileus, HCAP as suggestive per CXR 3/20  - CT abd/pelvis with mild leak around anastomosis but no other acute events, and surgery signed off  - leukocytosis has now resolved  Dysuria  -UA neg for pyuria  HCAP -  -Finished one week cefepime/levoflox and vancomycin 04/07/2013  -Remains afebrile without any respiratory distress  Chronic respiratory failure  -Continue trach collar 35% oxygenation  -clinically stable--SaO2 95-96%  Pulmonary vascular congestion - lung exam improving and stable.  - IVF stopped 3/20 due to pulmonary vascular congestion  - monitor I's/O's, daily weights  - weight trend: 203 lbs --> 195 --> 192 >> 194 >> 191 >>186  - repeat CXR 3/29 stable  Hypokalemia - continue supplementation. Magnesium normal.  Sacral ulcer - skin breakdown noted, closely monitor. Nursing care. No evidence of infection.  Anemia of chronic disease - no signs of active bleeding. Likely multifactorial ?medication induced, nutritional deficiencies. Closely monitor.  Essential hypertension - within normal limits today  Tracheostomy status  -pt requires frequent suctioning--will benefit going to LTAC  -Has had numerous episodes of mucous plugging requiring aggressive suctioning -concerned about ability to provide level of care he needs at [facility]  - Patient with a desaturation event 3/30 evening requiring suctioning.  -pulmonary toilet  Hypernatremia - TPN per pharmacy. Improving, supplemented 3/30 with  D5W.  Family Communication:  updated son on phone total time 35 min  Disposition Plan: [name] vs [facility]          Procedures/Studies: Ct Abdomen Pelvis W Contrast  04/04/2013   CLINICAL DATA:  Liver flight non.  Leukocytosis.  EXAM: CT ABDOMEN AND PELVIS WITH CONTRAST  TECHNIQUE: Multidetector CT imaging of the abdomen and pelvis was performed using the standard protocol following bolus administration of intravenous contrast.  CONTRAST:  90mL OMNIPAQUE IOHEXOL 300 MG/ML  SOLN  COMPARISON:  DG ABD PORTABLE 1V dated 04/04/2013; CT ABD/PELVIS W CM dated 03/28/2013; CT ABD-PELV W/O CM dated 03/11/2013; CT ABD/PELVIS W CM dated 01/30/2013  FINDINGS: Stable moderate left and small right pleural effusions with associated passive atelectasis. Mild cardiomegaly. Postoperative findings along the gastroesophageal junction.  Slightly reduce marginal definition of the infiltrative hypodense process involving the right hepatic lobe and portions of segment 4 of the liver.  The spleen, adrenal glands, and pancreas unremarkable. Small amount of fluid along the gallbladder fossa and along the inferior edge of the right hepatic lobe could, similar to prior. Gallbladder surgically absent.  There is contrast medium in the renal collecting systems on the initial portal venous phase images, likely due to inadvertent early injection.  Stable renal hypodensities. Infrarenal IVC filter. Continued fluid along the inferior margin of the laparotomy wound. Trace fluid in the lower omentum. Stable trace presacral edema.  Bilateral chronic pars defects at L5 observed with 9 mm of anterolisthesis.  Orally administered contrast extends through to the rectum. Mild circumferential rectal wall thickening.  The patient seems to have had right hemicolectomy, with reanastomosis to the small bowel on image 39 of series 5. Outpouching from the proximal terminal margin of the remaining colon shown on images 32 through 21 of series 5, potentially some type of  postoperative diverticulum or contained leak along the stable site.  IMPRESSION: 1. Similar distribution but reduced conspicuity of the peripheral infiltrative hepatic hypodensity affecting the right hepatic lobe an adjacent portion of segment 4. Fatty infiltration is the most common cause for and infiltrative hypodensity of this type, although localized parenchymal inflammation is not entirely excluded. MRI could differentiate if clinically warranted. 2. Small amount of perihepatic ascites, similar to prior. 3. Hypodense renal lesions, similar to prior. 4. Patient appears to have had right hemicolectomy with small bowel reanastomosis. There is a collection of gas and contrast extending to the right of the proximal terminal margin of the remaining colon, potentially a small contained leak along the staple line. 5. Continued fluid along the inferior margin of the laparotomy wound, but without internal gas density currently.   Electronically Signed   By: Herbie BaltimoreWalt  Liebkemann M.D.   On: 04/04/2013 18:40   Ct Abdomen Pelvis W Contrast  03/28/2013   CLINICAL DATA:  Abdominal distention.  EXAM: CT ABDOMEN AND PELVIS WITH CONTRAST  TECHNIQUE: Multidetector CT imaging of the abdomen and pelvis was performed using the standard protocol following bolus administration of intravenous contrast.  CONTRAST:  100mL OMNIPAQUE IOHEXOL 300 MG/ML  SOLN  COMPARISON:  DG ABD ACUTE W/CHEST dated 03/28/2013; CT ABD-PELV W/O CM dated 03/11/2013; CT ABD/PELVIS W CM dated 01/30/2013  FINDINGS: There is a new lucency is again noted in the liver. This remains unchanged in appearance and may be infectious in etiology. No clearcut fluid collection in or air collection is noted in the liver to suggest a definite abscess. These changes may be related to phlegmon. Close follow up to evaluate for  developing abscess suggested. Hepatic veins patent. Splenic and portal veins patent. No focal significant splenic abnormality. Pancreas normal. Cholecystectomy.  No biliary distention.  Adrenals normal. Stable approximate 13 mm low-density lesion in the left kidney, not definite simple cyst. As noted on prior study, MRI of the kidneys suggest for further evaluation. No hydronephrosis. The bladder is nondistended. No free pelvic fluid. Prostate is not enlarged. Calcifications within the prostate. No significant adenopathy. Abdominal aorta is atherosclerotic. No aneurysm. Visceral vessels are patent. Inferior vena caval filter noted in the IVC with tip just below the renal veins.  Previously identified drainage catheter in the right upper quadrant has been removed. Inflammatory changes are noted in the right mid abdomen, no abscess noted. The patient has had a prior hemicolectomy. Reference is made to CT report of 01/31/2012 which discusses fistula changes within the abdomen. Patient status post right hemicolectomy. The colon is moderately distended and contains fluid levels. These changes have improved from prior exam suggesting improving ileus. These changes may be related to a diarrheal illness. Follow-up abdominal series suggested. No small bowel distention. The stomach is nondistended. No free air noted.  Atelectasis and/or infiltrates in the lung bases. Bilateral pleural effusions. Cardiomegaly. Coronary artery disease. Midline abdominal scar noted. Fluid noted in the region of the scar with associated small amount of air. Developing abscess should be considered. These findings have progressed slightly from prior study. Multiple anterior abdominal wall subcutaneous nodules most likely injection granulomas.  IMPRESSION: 1. Findings consistent with persistent phlegmon in the liver. 2. Interim removal of right upper quadrant drainage catheter. 3. Colonic distention with air-fluid levels. These findings have improved from prior study suggesting improving adynamic ileus or diarrheal illness. Followup abdominal series suggested. 4. Slight progression of soft tissue fluid in  the anterior abdomen in the region the patient's scarring. This could represent developing phlegmon/abscess. Small amount of air is noted within this fluid collection. 5. 13 mm low-density lesion left kidney, not definite simple cyst. As noted on prior study nonenhanced and enhanced MRI of the kidneys suggested for further evaluation.   Electronically Signed   By: Maisie Fus  Register   On: 03/28/2013 23:29   Dg Chest Port 1 View  04/10/2013   CLINICAL DATA:  Cough and congestion.  EXAM: PORTABLE CHEST - 1 VIEW  COMPARISON:  DG CHEST 1V PORT dated 04/03/2013  FINDINGS: Tracheostomy and left-sided PICC line positioning are stable. The right jugular central line has been removed since the prior chest x-ray. Lungs show lower volumes bilaterally with increased prominence of bilateral lower lobe atelectasis. There may be a component of left-sided pleural fluid. The heart remains moderately enlarged with evidence of prior placement of a left atrial appendage clip. No overt pulmonary edema is identified.  IMPRESSION: Lower volumes with increased prominence of bilateral lower lobe atelectasis. There may be a component of left-sided pleural fluid.   Electronically Signed   By: Irish Lack M.D.   On: 04/10/2013 11:29   Dg Chest Port 1 View  04/03/2013   CLINICAL DATA:  Central venous catheter placement  EXAM: PORTABLE CHEST - 1 VIEW  COMPARISON:  DG CHEST 1V PORT dated 04/01/2013  FINDINGS: Left upper extremity PICC is been placed. Tip is in the lower SVC. Tracheostomy tube and left atrial appendage clip stable. Left pleural effusion and basilar consolidation stable. Vascular congestion increased.  IMPRESSION: Left PICC placed with its tip at the lower SVC.  Increased vascular congestion.  Stable left basilar consolidation and left pleural effusion.  Electronically Signed   By: Maryclare Bean M.D.   On: 04/03/2013 09:04   Dg Chest Port 1 View  04/01/2013   CLINICAL DATA:  Leukocytosis  EXAM: PORTABLE CHEST - 1 VIEW   COMPARISON:  03/28/2013  FINDINGS: Postsurgical changes are again seen. A right-sided jugular central line is noted in the right innominate vein stable from the prior exam. The cardiac shadow is stable. Left basilar consolidation with increasing effusion is noted.  IMPRESSION: Increasing left basilar infiltrate and effusion.   Electronically Signed   By: Alcide Clever M.D.   On: 04/01/2013 09:10   Dg Abd 2 Views  03/30/2013   CLINICAL DATA:  Colonic distention.  Multiple abdominal surgeries.  EXAM: ABDOMEN - 2 VIEW  COMPARISON:  DG ABD 2 VIEWS dated 03/29/2013; CT ABD/PELVIS W CM dated 03/28/2013  FINDINGS: Trace blunting of the right costophrenic angle. No free intraperitoneal gas.  Dilated loops of left upper quadrant small bowel measure up to 4 cm, similar to the prior exam. There are several air-fluid levels within these small bowel loops. Reduced colonic gas compared to the prior exam. IVC filter noted.  IMPRESSION: 1. Mildly dilated loops of left upper quadrant small bowel with scattered internal air-fluid levels, similar to the prior exam, with abnormal but nonspecific pattern which could be due to could ileus or partial small bowel obstruction. Correlate with bowel sounds and bowel function.   Electronically Signed   By: Herbie Baltimore M.D.   On: 03/30/2013 10:43   Dg Abd 2 Views  03/29/2013   CLINICAL DATA:  Lower abdominal pain. Nausea. Shortness of breath. Weakness.  EXAM: ABDOMEN - 2 VIEW  COMPARISON:  Abdominal radiograph 03/28/2013.  FINDINGS: Some colonic gas is noted. However, there are multiple borderline dilated of mildly dilated loops of gas-filled small bowel measuring up to 4.2 cm in diameter throughout the central abdomen and left side of the abdomen. No pneumoperitoneum appreciated on the left lateral decubitus view. Several small air-fluid levels are noted. Residual iodinated contrast material is noted within the lumen of the urinary bladder related to yesterday's CT examination.  Numerous surgical clips are noted in the right upper quadrant of the abdomen. IVC filter projecting over either the right side at L2-L3.  IMPRESSION: 1. Nonspecific bowel gas pattern, as above, suggestive of partial small bowel obstruction. 2. No pneumoperitoneum.   Electronically Signed   By: Trudie Reed M.D.   On: 03/29/2013 13:38   Dg Abd Acute W/chest  03/28/2013   CLINICAL DATA:  Evaluate for a bowel obstruction.  EXAM: ACUTE ABDOMEN SERIES (ABDOMEN 2 VIEW & CHEST 1 VIEW)  COMPARISON:  None.  FINDINGS: Tracheostomy tube tip is above the carina. Heart size is mildly enlarged. The lung volumes are low. There is asymmetric elevation of the left hemidiaphragm. Left pleural effusion is noted.  Patient has an IVC filter. There are persistent abnormally dilated loops of small bowel which measure up to 5.5 cm. Gas is noted within the colon and rectum. There is no evidence of dilated bowel loops or free intraperitoneal air. No radiopaque calculi or other significant radiographic abnormality is seen. Heart size and mediastinal contours are within normal limits. Both lungs are clear.  IMPRESSION: 1. Persistent small bowel dilatation compatible with either partial small bowel obstruction or ileus. 2. No change in aeration to the lungs compared with previous exam   Electronically Signed   By: Signa Kell M.D.   On: 03/28/2013 18:56   Dg Abd Portable 1v  04/07/2013   CLINICAL DATA:  Abdominal pain.  EXAM: PORTABLE ABDOMEN - 1 VIEW  COMPARISON:  None.  FINDINGS: The bowel gas pattern is normal. IVC filter seen at the level of L3. Surgical clips from prior cholecystectomy noted. No radio-opaque calculi or other significant radiographic abnormality are seen.  IMPRESSION: No acute findings.  Unremarkable bowel gas pattern.   Electronically Signed   By: Myles Rosenthal M.D.   On: 04/07/2013 15:26   Dg Abd Portable 1v  04/04/2013   CLINICAL DATA:  Evaluate barium  EXAM: PORTABLE ABDOMEN - 1 VIEW  COMPARISON:   03/30/2013  FINDINGS: Contrast material is noted within the transverse colon and splenic flexure of the colon. Small bowel loop distention has improved. Slight prominence of left abdominal small bowel loops persists. No free air. No organomegaly. Prior cholecystectomy.  IMPRESSION: Improving small bowel distention. Small amount of oral contrast material within the transverse colon and splenic flexure of the colon.   Electronically Signed   By: Charlett Nose M.D.   On: 04/04/2013 10:18   Dg Swallowing Func-speech Pathology  04/11/2013   Riley Nearing Deblois, CCC-SLP     04/11/2013  3:10 PM Objective Swallowing Evaluation: Modified Barium Swallowing Study   Patient Details  Name: Tim Short MRN: [id number] Date of Birth: [birth year]  Today's Date: 04/11/2013 Time: 4098-1191 SLP Time Calculation (min): 25 min  Past Medical History:  Past Medical History  Diagnosis Date  . Hypertension   . Hypercholesteremia   . Asthma   . Meniere disease   . Persistent atrial fibrillation     a. s/p multiple dccv's;  b. failed amio;  c. not felt to be AF  RFCA canddiate due to LA dil;  d. s/p failed hybrid ablation @  [facility] in 09/2012;  e. chronic pradaxa.  . Obesity   . Biatrial enlargement     LA size 5.3cm  . Obstructive sleep apnea     AHI 108/hr now on CPAP at 12cm H2O  . H/O hiatal hernia   . GERD (gastroesophageal reflux disease)     "associated w/hiatal hernia" (06-18-2012)  . Peptic ulcer 1950's  . Migraines     "haven't had one for about 15 years" (06/18/12)  . Arthritis     "joints" (06-18-2012)  . Chronic lower back pain   . Nephrolithiasis ~ 1960's    "passed on their own" (06-18-2012)  . History of pneumonia 2002; 2006    "spent 8 days in isolation; Norovirus" (18-Jun-2012)  . Other and unspecified angina pectoris   . RLS (restless legs syndrome)   . Coronary artery disease     a. 05/2012 Cath/PCI: LM nl, LAD nl, LCX 30m (3.0x15 Integrity  BMS), PTCA of OM1 through stent struts (kissing balloon).  . Diabetes mellitus without  complication     FAMILY STATES PATIENT IS NOT DIABETIC   Past Surgical History:  Past Surgical History  Procedure Laterality Date  . Wrist fracture surgery  1992    "repaired w/left hip bone graft" (June 18, 2012)  . Total knee arthroplasty  08/19/1999  . Colonoscopy w/ polypectomy  2006  . Knee arthroscopy with meniscal repair Right 1974    "medial meniscus repaired" (06/18/2012)  . Cardioversion  10/02/2011    Procedure: CARDIOVERSION;  Surgeon: Corky Crafts, MD;   Location: [facility] ENDOSCOPY;  Service: Cardiovascular;  Laterality:  N/A;  h/p in file drawer  . Cardioversion  11/07/2011    Procedure: CARDIOVERSION;  Surgeon: Corky Crafts, MD;  Location: [facility] ENDOSCOPY;  Service: Cardiovascular;  Laterality:  N/A;  h/p from 10/22 in file drawer/dl  . Cardiac catheterization  05/26/2012  . Coronary angioplasty with stent placement  06/04/2012    "1" (06/04/2012)  . Cataract extraction w/ intraocular lens  implant, bilateral   2009  . Hemorrhoid surgery  ~ 2003  . Hiatal hernia repair  1983  . Nissen fundoplication  1983  . Ablation of dysrhythmic focus  SEPT/OCT 2014    ATRIAL FIB  . Inguinal hernia repair Bilateral 2000 and 2005    "one done at a time" (06/04/2012)  . Colon surgery  10/08/2012   . 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 Pt was in  prolonged respiratory failure at [facility], unable to wean of the vent  and requiring trach placement 10/29/2012. He was transferred to  [facility] in December 2014 and later to [facility] in late January  2015. He was on TNA and was doing fairly well initially, but  developed more abdominal pain and on 2/23 taken to OR in [place]  for gallbladder removal (secondary to cholecystitis), resection  for  part of the colon for fistula, IVC filter placement. Sent  back to [facility] on march 4th, 2015 and started on TPN. Diet  advanced on March 9th, 2015 after pt passed swallow evaluation.  He was brought to [facility] March 16th, after noticing progressive  abdominal distension. In ED, CT abdomen with air- fluid level and  findings consistent with early partial SBO, ileus. Surgery  reports pt is ok for PO from their standpoint, SLP ordered for  swallow eval.      Assessment / Plan / Recommendation Clinical Impression  Dysphagia Diagnosis: Moderate pharyngeal phase dysphagia Clinical impression: Pt demonstrates significant improvement in  motor function since last MBS. Pts primary decifits are now  exclusively sensory in nature. Pt has a delay in swallow  initiation with nectar and thin teaspoon amounts with aspiration  before the swallow. With a chin tuck, the pt is albe to protect  the airway before the swallow with nectar vea teaspoon or straw.  Pt is also able to masticate soft and regular textured solids. He  was noted to have expectoration of mucous/barium after the test,  possible esophageal component not visualized due to body habitus.  Pt is recommended to upgrade to a dys 2 Finely chopped diet with  nectar thick liquids via spoon or straws with full supervison for  a chin tuck and placement of PMSV. Pt may upgrade solids if he  tolerates dys 2 diet.     Treatment Recommendation  Therapy as outlined in treatment plan below    Diet Recommendation Dysphagia 2 (Fine chop);Nectar-thick liquid   Liquid Administration via: Spoon;Straw Medication Administration: Crushed with puree Supervision: Patient able to self feed;Full supervision/cueing  for compensatory strategies Compensations: Slow rate;Small sips/bites Postural Changes and/or Swallow Maneuvers: Chin tuck;Seated  upright 90 degrees    Other  Recommendations Oral Care Recommendations: Oral care BID Other Recommendations: Place PMSV during PO intake;Order  thickener  from pharmacy;Have oral suction available   Follow Up Recommendations  Skilled Nursing facility    Frequency and Duration min 2x/week  2 weeks   Pertinent Vitals/Pain NA    SLP  Swallow Goals     General HPI: Pt is 79 yo male with fairly recent hospitalization  at [facility] in September 2014 for failed maize procedure and during  that hospitalization pt developed volvulus and has required right  colectomy (10/08/2012), subsequently 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 Pt was in  prolonged respiratory failure at [facility], unable to wean of the vent  and requiring trach placement 10/29/2012. He was transferred to  [facility] in December 2014 and later to [facility] in late January  2015. He was on TNA and was doing fairly well initially, but  developed more abdominal pain and on 2/23 taken to OR in [place]  for gallbladder removal (secondary to cholecystitis), resection  for part of the colon for fistula, IVC filter placement. Sent  back to [facility] on march 4th, 2015 and started on TPN. Diet  advanced on March 9th, 2015 after pt passed swallow evaluation.  He was brought to [facility] March 16th, after noticing progressive  abdominal distension. In ED, CT abdomen with air- fluid level and  findings consistent with early partial SBO, ileus. Surgery  reports pt is ok for PO from their standpoint, SLP ordered for  swallow eval.  Type of Study: Modified Barium Swallowing Study Reason for Referral: Objectively evaluate swallowing function Previous Swallow Assessment: [facility] hospital - 3/16 pt "passed"  FEES and was started on diet, began to decline and blue dye test  was given, pt silently aspirated thin, nectar, honey per report.  Diet Prior to this Study: Dysphagia 1 (puree);Pudding-thick  liquids Temperature Spikes Noted: No Respiratory Status: Trach collar Trach Size and Type: Cuff;Deflated;With PMSV in place;#6;Other  (Comment) History of Recent Intubation: No Behavior/Cognition:  Alert;Cooperative;Requires cueing;Hard of  hearing Oral Cavity - Dentition: Missing dentition Oral Motor / Sensory Function: Impaired - see Bedside swallow  eval Self-Feeding Abilities: Able to feed self;Needs assist Patient Positioning: Upright in chair Baseline Vocal Quality: Low vocal intensity Volitional Cough: Strong Volitional Swallow: Able to elicit Anatomy: Within functional limits Pharyngeal Secretions: Not observed secondary MBS    Reason for Referral Objectively evaluate swallowing function   Oral Phase Oral Preparation/Oral Phase Oral Phase: Impaired Oral Phase - Comment Oral Phase - Comment: missing dentition, but places boluse  effectively between remaining teeth. otherwise WFL.    Pharyngeal Phase Pharyngeal Phase Pharyngeal Phase: Impaired Pharyngeal - Honey Pharyngeal - Honey Teaspoon: Not tested Pharyngeal - Honey Cup: Not tested Pharyngeal - Nectar Pharyngeal - Nectar Teaspoon: Delayed swallow  initiation;Penetration/Aspiration before swallow (chin tuck  increases airway closure, preents penetration) Penetration/Aspiration details (nectar teaspoon): Material enters  airway, passes BELOW cords without attempt by patient to eject  out (silent aspiration);Material does not enter airway Pharyngeal - Nectar Straw: Delayed swallow initiation (chin tuck) Pharyngeal - Thin Pharyngeal - Thin Teaspoon: Delayed swallow  initiation;Penetration/Aspiration before swallow (chin tuck) Penetration/Aspiration details (thin teaspoon): Material enters  airway, CONTACTS cords and not ejected out Pharyngeal - Solids Pharyngeal - Puree: Delayed swallow initiation (chin tuck) Pharyngeal - Mechanical Soft: Delayed swallow initiation Pharyngeal - Regular: Delayed swallow initiation  Cervical Esophageal Phase    GO             Harlon Ditty, MA CCC-SLP [phone]  Claudine Mouton 04/11/2013, 3:08 PM    Dg Swallowing Func-speech Pathology  04/02/2013   Breck Coons Manly, CCC-SLP     04/02/2013  2:03 PM Objective  Swallowing Evaluation: Modified Barium Swallowing Study   Patient Details  Name: Tim Short MRN: [id number] Date  of Birth: 1934/11/01  Today's Date: 04/02/2013 Time: 1225-1250 SLP Time Calculation (min): 25 min  Past Medical History:  Past Medical History  Diagnosis Date  . Hypertension   . Hypercholesteremia   . Asthma   . Meniere disease   . Persistent atrial fibrillation     a. s/p multiple dccv's;  b. failed amio;  c. not felt to be AF  RFCA canddiate due to LA dil;  d. s/p failed hybrid ablation @  [facility] in 09/2012;  e. chronic pradaxa.  . Obesity   . Biatrial enlargement     LA size 5.3cm  . Obstructive sleep apnea     AHI 108/hr now on CPAP at 12cm H2O  . H/O hiatal hernia   . GERD (gastroesophageal reflux disease)     "associated w/hiatal hernia" (25-Jun-2012)  . Peptic ulcer 1950's  . Migraines     "haven't had one for about 15 years" (2012/06/25)  . Arthritis     "joints" (25-Jun-2012)  . Chronic lower back pain   . Nephrolithiasis ~ 1960's    "passed on their own" (06-25-12)  . History of pneumonia 2002; 2006    "spent 8 days in isolation; Norovirus" (06/25/12)  . Other and unspecified angina pectoris   . RLS (restless legs syndrome)   . Coronary artery disease     a. 05/2012 Cath/PCI: LM nl, LAD nl, LCX 2m (3.0x15 Integrity  BMS), PTCA of OM1 through stent struts (kissing balloon).  . Diabetes mellitus without complication     FAMILY STATES PATIENT IS NOT DIABETIC   Past Surgical History:  Past Surgical History  Procedure Laterality Date  . Wrist fracture surgery  1992    "repaired w/left hip bone graft" (2012/06/25)  . Total knee arthroplasty  08/19/1999  . Colonoscopy w/ polypectomy  2006  . Knee arthroscopy with meniscal repair Right 1974    "medial meniscus repaired" (2012-06-25)  . Cardioversion  10/02/2011    Procedure: CARDIOVERSION;  Surgeon: Corky Crafts, MD;   Location: [facility] ENDOSCOPY;  Service: Cardiovascular;  Laterality:  N/A;  h/p in file drawer  . Cardioversion  11/07/2011    Procedure:  CARDIOVERSION;  Surgeon: Corky Crafts, MD;   Location: [facility] ENDOSCOPY;  Service: Cardiovascular;  Laterality:  N/A;  h/p from 10/22 in file drawer/dl  . Cardiac catheterization  05/26/2012  . Coronary angioplasty with stent placement  06-25-12    "1" (2012-06-25)  . Cataract extraction w/ intraocular lens  implant, bilateral   2009  . Hemorrhoid surgery  ~ 2003  . Hiatal hernia repair  1983  . Nissen fundoplication  1983  . Ablation of dysrhythmic focus  SEPT/OCT 2014    ATRIAL FIB  . Inguinal hernia repair Bilateral 2000 and 2005    "one done at a time" (06-25-2012)  . Colon surgery  10/08/2012   . 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 Pt was in  prolonged respiratory failure at [facility], unable to wean of the vent  and requiring trach placement 10/29/2012. He was transferred to  [facility] in December 2014 and later to [facility] in late January  2015. He was on TNA and was doing fairly well initially, but  developed more abdominal pain and on  2/23 taken to OR in [place]  for gallbladder removal (secondary to cholecystitis), resection  for part of the colon for fistula, IVC filter placement. Sent  back to [facility] on march 4th, 2015 and started on TPN. Diet  advanced on March 9th, 2015 after pt passed swallow evaluation.  He was brought to [facility] March 16th, after noticing progressive  abdominal distension. In ED, CT abdomen with air- fluid level and  findings consistent with early partial SBO, ileus. Surgery  reports pt is ok for PO from their standpoint, SLP ordered for  swallow eval.      Assessment / Plan / Recommendation Clinical Impression  Dysphagia Diagnosis: Moderate pharyngeal phase dysphagia;Severe  pharyngeal phase dysphagia  Clinical impression: MBS completed with pt's PMSV donned and  appears to be congruent with study performed at [facility].  He  demonstrated moderate-severe motor based pharyngeal dyspahgia as  evidenced by reduced tongue base retraction, decreased laryngeal  elevation and decreased pharyngeal contraction.  Impairments led  to mild pharyngeal residue in vallecule/pyriform sinuses and  posterior pharyngeal wall and laryngeal vestibule consistently  silent penetration to vocal cords.  Verbal cues for hard cough  briefly cleared vestibule with subsequent penetration from  residue.  SLP recommends Dys 1 (puree) only, no liquids (pudding  thick liquid) with speaking valve donned, two swallows,  volitional coughs, pills crushed in applesauce and full  supervision/assist.       Treatment Recommendation  Therapy as outlined in treatment plan below    Diet Recommendation Dysphagia 1 (Puree);Pudding-thick liquid   Medication Administration: Crushed with puree Supervision: Patient able to self feed;Full supervision/cueing  for compensatory strategies Compensations: Slow rate;Small sips/bites;Multiple dry swallows  after each bite/sip;Hard cough after swallow Postural Changes and/or Swallow Maneuvers: Seated upright 90  degrees;Upright 30-60 min after meal, wear speaking valve during  all po's    Other  Recommendations Oral Care Recommendations: Oral care BID Other Recommendations: Order thickener from pharmacy   Follow Up Recommendations  Skilled Nursing facility    Frequency and Duration min 2x/week  2 weeks   Pertinent Vitals/Pain WDL            Reason for Referral Objectively evaluate swallowing function   Oral Phase Oral Preparation/Oral Phase Oral Phase: WFL (WFL for textures assessed)   Pharyngeal Phase Pharyngeal Phase Pharyngeal Phase: Impaired Pharyngeal - Honey Pharyngeal - Honey Teaspoon: Reduced pharyngeal  peristalsis;Pharyngeal residue - valleculae;Reduced tongue base  retraction;Penetration/Aspiration during  swallow;Pharyngeal  residue - pyriform sinuses;Reduced laryngeal elevation Penetration/Aspiration details (honey teaspoon): Material enters  airway, CONTACTS cords and not ejected out Pharyngeal - Honey Cup: Penetration/Aspiration during  swallow;Pharyngeal residue - valleculae;Pharyngeal residue -  pyriform sinuses;Reduced tongue base retraction;Reduced laryngeal  elevation Penetration/Aspiration details (honey cup): Material enters  airway, remains ABOVE vocal cords then ejected out Pharyngeal - Solids Pharyngeal - Puree: Pharyngeal residue - valleculae;Reduced  tongue base retraction;Pharyngeal residue - pyriform  sinuses;Reduced laryngeal elevation  Cervical Esophageal Phase        Cervical Esophageal Phase Cervical Esophageal Phase: [facility]         Darrow Bussing.Ed CCC-SLP Pager [phone]  04/02/2013         Subjective:  patient had an episode of mucous plugging requiring aggressive suctioning today. Breathing improved after suctioning with saline. No desaturation noted. The patient denies any fevers, chills, chest pain, vomiting, headache. He continues to have chronic abdominal pain unchanged. Breathing is unchanged.   Objective: Filed Vitals:   04/17/13 0827 04/17/13 1158 04/17/13 1206 04/17/13 1452  BP:  114/85  122/79  Pulse:  96  110  Temp:    98.2 F (36.8 C)  TempSrc:    Oral  Resp:    20  Height:      Weight:      SpO2: 95%  96% 98%    Intake/Output Summary (Last 24 hours) at 04/17/13 1655 Last data filed at 04/17/13 1453  Gross per 24 hour  Intake 2585.29 ml  Output   1025 ml  Net 1560.29 ml   Weight change: 0.454 kg (1 lb) Exam:   General:  Pt is alert, follows commands appropriately, not in acute distress  HEENT: No icterus, No thrush [place]/AT  Cardiovascular: IRRR, no rubs, no gallops  Respiratory: scattered bilateral rales  Abdomen: Soft/+BS, non tender, non distended, no guarding  Extremities: trace LE edema, No lymphangitis, No petechiae, No rashes, no  synovitis  Data Reviewed: Basic Metabolic Panel:  Recent Labs Lab 04/11/13 0456  04/13/13 0605 04/14/13 0530 04/15/13 0530 04/16/13 0512 04/17/13 1120  NA 152*  < > 148* 148* 144 145 142  K 3.6*  < > 3.3* 3.6* 3.3* 3.6* 4.0  CL 114*  < > 110 109 105 106 104  CO2 26  < > 27 28 27 28 30   GLUCOSE 221*  < > 181* 168* 158* 171* 182*  BUN 25*  < > 21 19 16 16 17   CREATININE 0.57  < > 0.59 0.57 0.59 0.62 0.66  CALCIUM 8.8  < > 8.5 8.6 8.3* 8.4 8.3*  MG 2.2  --   --  2.1 2.0 2.0  --   PHOS 3.1  --   --  3.5 3.5 3.5  --   < > = values in this interval not displayed. Liver Function Tests:  Recent Labs Lab 04/11/13 0456 04/12/13 0525 04/14/13 0530 04/16/13 0512  AST 19 20 79* 56*  ALT 33 27 69* 69*  ALKPHOS 68 64 76 80  BILITOT 0.4 0.4 0.3 0.3  PROT 5.1* 4.9* 5.0* 4.9*  ALBUMIN 2.3* 2.1* 2.2* 2.1*   No results found for this basename: LIPASE, AMYLASE,  in the last 168 hours No results found for this basename: AMMONIA,  in the last 168 hours CBC:  Recent Labs Lab 04/11/13 0456 04/12/13 0525 04/13/13 0605 04/15/13 0530 04/16/13 0900 04/17/13 1120  WBC 10.3 8.9 7.0 7.5 8.8 8.4  NEUTROABS 8.6*  --   --  5.5  --   --   HGB 8.7* 8.5* 8.5* 8.6* 8.3* 8.0*  HCT 28.5* 28.4* 27.6* 28.0* 26.4* 26.3*  MCV 98.6 99.3 96.8 96.9 96.0 96.3  PLT 114* 100* 105* 108* 109* 110*   Cardiac Enzymes: No results found for this basename: CKTOTAL, CKMB, CKMBINDEX, TROPONINI,  in the last 168 hours BNP: No components found with this basename: POCBNP,  CBG:  Recent Labs Lab 04/16/13 1710 04/16/13 2150 04/17/13 0622 04/17/13 1140 04/17/13 1621  GLUCAP 160* 129* 150* 174* 156*    Recent Results (from the past 240 hour(s))  CLOSTRIDIUM DIFFICILE BY PCR     Status: None   Collection Time    04/10/13  9:03 AM      Result Value Ref Range Status   C difficile by pcr NEGATIVE  NEGATIVE Final  URINE CULTURE     Status: None   Collection Time    04/13/13  2:31 PM      Result Value Ref  Range Status   Specimen Description URINE, RANDOM   Final  Special Requests NONE   Final   Culture  Setup Time     Final   Value: 04/13/2013 18:52     Performed at [facility] Count     Final   Value: 8,000 COLONIES/ML     Performed at [facility]   Culture     Final   Value: INSIGNIFICANT GROWTH     Performed at [facility]   Report Status 04/14/2013 FINAL   Final  CLOSTRIDIUM DIFFICILE BY PCR     Status: Abnormal   Collection Time    04/16/13  4:44 PM      Result Value Ref Range Status   C difficile by pcr POSITIVE (*) NEGATIVE Final   Comment: CRITICAL RESULT CALLED TO, READ BACK BY AND VERIFIED WITH:     M.CROSSON,RN 1806 04/16/13 M.CAMPBELL     Scheduled Meds: . amiodarone  200 mg Oral BID  . atorvastatin  40 mg Oral Daily  . chlorhexidine  15 mL Mouth/Throat BID  . diltiazem  240 mg Oral BID  . feeding supplement (ENSURE)  1 Container Oral TID BM  . hydrocortisone cream   Topical BID  . insulin aspart  0-9 Units Subcutaneous TID WC  . metoprolol tartrate  12.5 mg Oral BID  . metroNIDAZOLE  500 mg Oral 3 times per day  . pantoprazole  40 mg Oral Daily  . predniSONE  5 mg Oral Q breakfast  . roflumilast  500 mcg Oral Daily  . sodium chloride  10-40 mL Intracatheter Q12H   Continuous Infusions: . sodium chloride    . dextrose 5 % and 0.45 % NaCl with KCl 40 mEq/L 50 mL/hr at 04/16/13 2142  . Marland KitchenTPN (CLINIMIX-E) Adult 60 mL/hr at 04/16/13 1805   And  . fat emulsion 250 mL (04/16/13 1806)  . Marland KitchenTPN (CLINIMIX-E) Adult     And  . fat emulsion    . heparin 1,100 Units/hr (04/16/13 1833)     Marquie Aderhold, DO  [facility] Pager [phone]  If 7PM-7AM, please contact night-coverage [email or web address] Password [email or web address] 04/17/2013, 4:55 PM   LOS: 20 days

## 2013-04-18 LAB — COMPREHENSIVE METABOLIC PANEL
ALBUMIN: 2.2 g/dL — AB (ref 3.5–5.2)
ALT: 49 U/L (ref 0–53)
AST: 33 U/L (ref 0–37)
Alkaline Phosphatase: 74 U/L (ref 39–117)
BUN: 19 mg/dL (ref 6–23)
CO2: 26 mEq/L (ref 19–32)
Calcium: 8.5 mg/dL (ref 8.4–10.5)
Chloride: 104 mEq/L (ref 96–112)
Creatinine, Ser: 0.74 mg/dL (ref 0.50–1.35)
GFR calc Af Amer: >90 mL/min (ref >90)
GFR calc non Af Amer: 87 mL/min — ABNORMAL LOW (ref >90)
Glucose, Bld: 166 mg/dL — ABNORMAL HIGH (ref 70–99)
POTASSIUM: 4.1 meq/L (ref 3.7–5.3)
Sodium: 142 mEq/L (ref 137–147)
Total Bilirubin: <0.2 mg/dL — ABNORMAL LOW (ref 0.3–1.2)
Total Protein: 5.1 g/dL — ABNORMAL LOW (ref 6.0–8.3)

## 2013-04-18 LAB — MAGNESIUM: Magnesium: 2.1 mg/dL (ref 1.5–2.5)

## 2013-04-18 LAB — PREALBUMIN: PREALBUMIN: 18.6 mg/dL (ref 17.0–34.0)

## 2013-04-18 LAB — HEPARIN INDUCED THROMBOCYTOPENIA PNL
Heparin Induced Plt Ab: NEGATIVE
PATIENT O. D.: 0.032
UFH High Dose UFH H: 0 % Release
UFH Low Dose 0.1 IU/mL: 7 % Release
UFH Low Dose 0.5 IU/mL: 0 % Release
UFH SRA Result: NEGATIVE

## 2013-04-18 LAB — GLUCOSE, CAPILLARY
GLUCOSE-CAPILLARY: 154 mg/dL — AB (ref 70–99)
GLUCOSE-CAPILLARY: 181 mg/dL — AB (ref 70–99)
GLUCOSE-CAPILLARY: 177 mg/dL — AB (ref 70–99)

## 2013-04-18 LAB — DIFFERENTIAL
Basophils Absolute: 0 10*3/uL (ref 0.0–0.1)
Basophils Relative: 0 % (ref 0–1)
EOS PCT: 2 % (ref 0–5)
Eosinophils Absolute: 0.1 10*3/uL (ref 0.0–0.7)
LYMPHS ABS: 1.6 10*3/uL (ref 0.7–4.0)
LYMPHS PCT: 18 % (ref 12–46)
Monocytes Absolute: 0.5 10*3/uL (ref 0.1–1.0)
Monocytes Relative: 6 % (ref 3–12)
Neutro Abs: 6.4 10*3/uL (ref 1.7–7.7)
Neutrophils Relative %: 74 % (ref 43–77)

## 2013-04-18 LAB — CBC
HEMATOCRIT: 26.4 % — AB (ref 39.0–52.0)
HEMOGLOBIN: 8.1 g/dL — AB (ref 13.0–17.0)
MCH: 29.5 pg (ref 26.0–34.0)
MCHC: 30.7 g/dL (ref 30.0–36.0)
MCV: 96 fL (ref 78.0–100.0)
Platelets: 121 10*3/uL — ABNORMAL LOW (ref 150–400)
RBC: 2.75 MIL/uL — ABNORMAL LOW (ref 4.22–5.81)
RDW: 19.6 % — ABNORMAL HIGH (ref 11.5–15.5)
WBC: 8.6 10*3/uL (ref 4.0–10.5)

## 2013-04-18 LAB — HEPARIN LEVEL (UNFRACTIONATED): Heparin Unfractionated: 0.35 IU/mL (ref 0.30–0.70)

## 2013-04-18 LAB — APTT: aPTT: 92 seconds — ABNORMAL HIGH (ref 24–37)

## 2013-04-18 LAB — PHOSPHORUS: PHOSPHORUS: 3.8 mg/dL (ref 2.3–4.6)

## 2013-04-18 LAB — TRIGLYCERIDES: Triglycerides: 114 mg/dL (ref <150)

## 2013-04-18 MED ORDER — CEFAZOLIN SODIUM-DEXTROSE 2-3 GM-% IV SOLR
2.0000 g | INTRAVENOUS | Status: AC
  Filled 2013-04-18 (×2): qty 50

## 2013-04-18 MED ORDER — TRACE MINERALS CR-CU-F-FE-I-MN-MO-SE-ZN IV SOLN
INTRAVENOUS | Status: AC
  Administered 2013-04-18: 17:00:00 via INTRAVENOUS
  Filled 2013-04-18: qty 2000

## 2013-04-18 MED ORDER — ENSURE PUDDING PO PUDG: 1.0000 | Freq: Three times a day (TID) | ORAL | Status: DC | PRN

## 2013-04-18 MED ORDER — FAT EMULSION 20 % IV EMUL
250.0000 mL | INTRAVENOUS | Status: AC
  Administered 2013-04-18: 250 mL via INTRAVENOUS
  Filled 2013-04-18: qty 250

## 2013-04-18 MED ORDER — DILTIAZEM HCL 60 MG PO TABS: 120.0000 mg | ORAL_TABLET | Freq: Three times a day (TID) | ORAL | Status: DC

## 2013-04-18 NOTE — Progress Notes (Signed)
Physical Therapy Treatment Patient Details Name: Tim Short MRN: [id number] DOB: 12/29/1934 Today's Date: 04/18/2013    History of Present Illness 79 yo male with recent complications at [facility] after an ablation for afib in oct 2014 he developed a volvulus after the ablation was intubated/trachedm required multiple abd surgeries then developed fistula details unknown.  Has been on tpn, with ngt on/off since. Is at [facility] sent here for complaints of abd pain.  Pt was taken off tpn and feeding tube was removed last week.  He has been eating by mouth and doing well.  But last 4 days with worsening abd pain, no n/v/d.  No fevers (however is was started on vanc and zosyn for unknown reasons).  Pt na level is elevated, and appears dehydrated. 04/08/13 Pt now reports that he is having vertigo which has ebbed and flowed for the  past 14 years.    PT Comments    Worked hard on the exercises in bed without complaint.  Transfers remain difficult due to significant LE weakness  Follow Up Recommendations  LTACH     Equipment Recommendations  Other (comment)    Recommendations for Other Services       Precautions / Restrictions Precautions Precautions: Fall Restrictions Weight Bearing Restrictions: No    Mobility  Bed Mobility Overal bed mobility: Needs Assistance Bed Mobility: Supine to Sit   Sidelying to sit: Mod assist   Sit to supine: Mod assist;+2 for safety/equipment   General bed mobility comments: significant truncal assist  Transfers Overall transfer level: Needs assistance   Transfers: Sit to/from Stand;Squat Pivot Transfers Sit to Stand: Mod assist;+2 physical assistance   Squat pivot transfers: Max assist;+2 physical assistance     General transfer comment: significant lifting assist with LE support  Ambulation/Gait                 Stairs            Wheelchair Mobility    Modified Rankin (Stroke Patients Only)       Balance Overall balance  assessment: Needs assistance Sitting-balance support: Feet supported;Bilateral upper extremity supported Sitting balance-[name] Scale: Poor Sitting balance - Comments: tendency to fall backward due to dynamic mattress needing min assist Postural control: Posterior lean Standing balance support: Bilateral upper extremity supported Standing balance-[name] Scale: Zero                      Cognition Arousal/Alertness: Awake/alert Behavior During Therapy: WFL for tasks assessed/performed Overall Cognitive Status: Within Functional Limits for tasks assessed                      Exercises General Exercises - Lower Extremity Ankle Circles/Pumps: AROM;AAROM;Both;10 reps;Supine;Other (comment) (No df on R LE) Quad Sets: AROM;Both;10 reps;Supine Gluteal Sets: AROM;Both;10 reps;Supine Heel Slides: AROM;Strengthening;Both;10 reps;Supine;Sidelying;Other (comment) (resisted flexion/ extension) Hip ABduction/ADduction: AROM;Both;15 reps;Supine Straight Leg Raises: AROM;AAROM;Both;10 reps;Supine    General Comments General comments (skin integrity, edema, etc.): Pt is profoundly weak due to not eating.  Consent being gotten for PEG placement.  Pt also has R drop foot as well as unstable ankle with tendency to supinate with weightbearing      Pertinent Vitals/Pain     Home Living                      Prior Function            PT Goals (current goals can now be found in  the care plan section) Acute Rehab PT Goals Patient Stated Goal: To get back home PT Goal Formulation: With patient Time For Goal Achievement: 04/25/13 Potential to Achieve Goals: Fair Progress towards PT goals: Progressing toward goals    Frequency  Min 3X/week    PT Plan Current plan remains appropriate    Co-evaluation             End of Session Equipment Utilized During Treatment: Oxygen Activity Tolerance: Patient limited by fatigue;Other (comment) (limited by dizziness) Patient  left: in bed;with call bell/phone within reach     Time: 1252-1315 PT Time Calculation (min): 23 min  Charges:  $Therapeutic Exercise: 8-22 mins $Therapeutic Activity: 8-22 mins                    G Codes:      Sanders Manninen, Eliseo Gum 04/18/2013, 3:45 PM 04/18/2013  Rome Bing, PT [phone]  (pager)

## 2013-04-18 NOTE — H&P (Signed)
Agree 

## 2013-04-18 NOTE — H&P (Signed)
Tim Short is an 79 y.o. male.   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 Hypertension   . Hypercholesteremia   . Asthma   . Meniere disease   . Persistent atrial fibrillation     a. s/p multiple dccv's;  b. failed amio;  c. not felt to be AF RFCA canddiate due to LA dil;  d. s/p failed hybrid ablation @ [facility] in 09/2012;  e. chronic pradaxa.  . Obesity   . Biatrial enlargement     LA size 5.3cm  . Obstructive sleep apnea     AHI 108/hr now on CPAP at 12cm H2O  . H/O hiatal hernia   . GERD (gastroesophageal reflux disease)     "associated w/hiatal hernia" (Jun 06, 2012)  . Peptic ulcer 1950's  . Migraines     "haven't had one for about 15 years" (06-06-12)  . Arthritis     "joints" (2012-06-06)  . Chronic lower back pain   . Nephrolithiasis ~ 1960's    "passed on their own" (06/06/12)  . History of pneumonia 2002; 2006    "spent 8 days in isolation; Norovirus" (06-06-2012)  . Other and unspecified angina pectoris   . RLS (restless legs syndrome)   . Coronary artery disease     a. 05/2012 Cath/PCI: LM nl, LAD nl, LCX 57m(3.0x15 Integrity BMS), PTCA of OM1 through stent struts (kissing balloon).  . Diabetes mellitus without complication     FAMILY STATES PATIENT IS NOT DIABETIC    Past Surgical History  Procedure Laterality Date  . Wrist fracture surgery  1992    "repaired w/left hip bone graft"  (505/25/2014  . Total knee arthroplasty  08/19/1999  . Colonoscopy w/ polypectomy  2006  . Knee arthroscopy with meniscal repair Right 1974    "medial meniscus repaired" (505-25-2014  . Cardioversion  10/02/2011    Procedure: CARDIOVERSION;  Surgeon: JJettie Booze MD;  [facility];  Service: Cardiovascular;  Laterality: N/A;  h/p in file drawer  . Cardioversion  11/07/2011    Procedure: CARDIOVERSION;  Surgeon: JJettie Booze MD;  [facility];  Service: Cardiovascular;  Laterality: N/A;  h/p from 10/22 in file drawer/dl  . Cardiac catheterization  05/26/2012  . Coronary angioplasty with stent placement  505/25/2014   "1" (505/25/2014  . Cataract extraction w/ intraocular lens  implant, bilateral  2009  . Hemorrhoid surgery  ~ 2003  . Hiatal hernia repair  1983  . Nissen fundoplication  19480 . Ablation of dysrhythmic focus  SEPT/OCT 2014    ATRIAL FIB  . Inguinal hernia repair Bilateral 2000 and 2005    "one done at a time" (5May 25, 2014  . Colon surgery  10/08/2012   . Tracheostomy  OCT 2014  . Cardioversion N/A 04/08/2013    Procedure: CARDIOVERSION    (BEDSIDE) ;  Surgeon: JCarlena Bjornstad MD;  [facility]  Service: Cardiovascular;  Laterality: N/A;    Family History  Problem Relation Age of Onset  . Congestive Heart Failure    . COPD Father   . Heart failure Sister   . Heart failure Brother   . CVA Brother   . Heart attack Brother   . Heart disease Brother   . CVA Brother   . Heart disease Brother    Social History:  reports that he quit smoking about 31 years ago. His smoking use included Pipe. He has never used smokeless tobacco. He reports that he does not drink alcohol or use illicit drugs.  Allergies:  Allergies  Allergen Reactions  . Corticosteroids     Inhaled Corticosteroids--Hoarseness, dry mouth  . Furosemide Other (See Comments)    Ototoxicity     Medications Prior to Admission  Medication Sig Dispense Refill  .  acetaminophen (TYLENOL) 500 MG tablet Take 500 mg by mouth every 6 (six) hours as needed for pain or fever.       Marland Kitchen albuterol (PROVENTIL HFA;VENTOLIN HFA) 108 (90 BASE) MCG/ACT inhaler Inhale 2 puffs into the lungs every 6 (six) hours as needed for wheezing or shortness of breath.       Marland Kitchen atorvastatin (LIPITOR) 40 MG tablet Take 40 mg by mouth daily.      . chlorhexidine (PERIDEX) 0.12 % solution Use as directed 15 mLs in the mouth or throat 2 (two) times daily.      . dabigatran (PRADAXA) 150 MG CAPS capsule Take 150 mg by mouth 2 (two) times daily.      Marland Kitchen diltiazem (DILACOR XR) 240 MG 24 hr capsule Take 240 mg by mouth daily.      . insulin regular (NOVOLIN R,HUMULIN R) 100 units/mL injection Inject 0-5 Units into the skin every 6 (six) hours. 70-150=0 units, 151-200=1units, 201-250= 2 units, 251-300= 3 units, 301-350 = 4 units, 351-400= 5 units, 401+= call Prescriber      . ipratropium-albuterol (DUONEB) 0.5-2.5 (3) MG/3ML SOLN Take 3 mLs by nebulization every 6 (six) hours as needed.      . isosorbide mononitrate (IMDUR) 30 MG 24 hr tablet Take 30 mg by mouth daily.      Marland Kitchen lisinopril (PRINIVIL,ZESTRIL) 5 MG tablet Take 5 mg by mouth at bedtime.      . meclizine (ANTIVERT) 25 MG tablet Take 25 mg by mouth 3 (three) times daily as needed for dizziness.       . nitroGLYCERIN (NITROSTAT) 0.4 MG SL tablet Place 0.4 mg under the tongue every 5 (five) minutes as needed for chest pain.      Marland Kitchen oxycodone (OXY-IR) 5 MG capsule Give 5 mg by tube 2 (two) times daily as needed for pain.      . pantoprazole sodium (PROTONIX) 40 mg/20 mL PACK Place 40 mg into feeding tube 2 (two) times daily.      . piperacillin-tazobactam (ZOSYN) 3-0.375 G injection Inject 3.375 g into the muscle every 6 (six) hours. For 10 days      . potassium chloride SA (K-DUR,KLOR-CON) 20 MEQ tablet Take 20 mEq by mouth daily.      . predniSONE (DELTASONE) 10 MG tablet Take 10 mg by mouth daily with breakfast.      . roflumilast  (DALIRESP) 500 MCG TABS tablet Take 500 mcg by mouth daily.      . sitaGLIPtin (JANUVIA) 100 MG tablet Take 100 mg by mouth daily.      . Sodium Chloride (HYPERSAL) 3.5 % NEBU 1 vial by  Intratracheal route every 6 (six) hours.      . sodium chloride 0.9 % SOLN 500 mL with vancomycin 10 G SOLR 1,500 mg Inject 1,500 mg into the vein daily.      . Vitamins A & D (VITAMIN A & D) ointment Apply 1 application topically as needed (to sacral area).        Results for orders placed during the hospital encounter of 03/28/13 (from the past 48 hour(s))  CLOSTRIDIUM DIFFICILE BY PCR     Status: Abnormal   Collection Time    04/16/13  4:44 PM      Result Value Ref Range   C difficile by pcr POSITIVE (*) NEGATIVE   Comment: CRITICAL RESULT CALLED TO, READ BACK BY AND VERIFIED WITH:     M.CROSSON,RN 1806 04/16/13 M.CAMPBELL  GLUCOSE, CAPILLARY     Status: Abnormal   Collection Time    04/16/13  5:10 PM      Result Value Ref Range   Glucose-Capillary 160 (*) 70 - 99 mg/dL  GLUCOSE, CAPILLARY     Status: Abnormal   Collection Time    04/16/13  9:50 PM      Result Value Ref Range   Glucose-Capillary 129 (*) 70 - 99 mg/dL  GLUCOSE, CAPILLARY     Status: Abnormal   Collection Time    04/17/13  6:22 AM      Result Value Ref Range   Glucose-Capillary 150 (*) 70 - 99 mg/dL  APTT     Status: Abnormal   Collection Time    04/17/13 11:00 AM      Result Value Ref Range   aPTT 72 (*) 24 - 37 seconds   Comment:            IF BASELINE aPTT IS ELEVATED,     SUGGEST PATIENT RISK ASSESSMENT     BE USED TO DETERMINE APPROPRIATE     ANTICOAGULANT THERAPY.  HEPARIN LEVEL (UNFRACTIONATED)     Status: Abnormal   Collection Time    04/17/13 11:00 AM      Result Value Ref Range   Heparin Unfractionated 0.28 (*) 0.30 - 0.70 IU/mL   Comment:            IF HEPARIN RESULTS ARE BELOW     EXPECTED VALUES, AND PATIENT     DOSAGE HAS BEEN CONFIRMED,     SUGGEST FOLLOW UP TESTING     OF ANTITHROMBIN III LEVELS.   BASIC METABOLIC PANEL     Status: Abnormal   Collection Time    04/17/13 11:20 AM      Result Value Ref Range   Sodium 142  137 - 147 mEq/L   Potassium 4.0  3.7 - 5.3 mEq/L   Chloride 104  96 - 112 mEq/L   CO2 30  19 - 32 mEq/L   Glucose, Bld 182 (*) 70 - 99 mg/dL   BUN 17  6 - 23 mg/dL   Creatinine, Ser 0.66  0.50 - 1.35 mg/dL   Calcium 8.3 (*) 8.4 - 10.5 mg/dL   GFR calc non Af Amer >90  >90 mL/min   GFR calc Af Amer >90  >90 mL/min   Comment: (NOTE)     The eGFR has been calculated using the CKD EPI equation.     This calculation has not been validated in all clinical situations.     eGFR's persistently <90 mL/min signify possible Chronic Kidney     Disease.  CBC  Status: Abnormal   Collection Time    04/17/13 11:20 AM      Result Value Ref Range   WBC 8.4  4.0 - 10.5 K/uL   RBC 2.73 (*) 4.22 - 5.81 MIL/uL   Hemoglobin 8.0 (*) 13.0 - 17.0 g/dL   HCT 26.3 (*) 39.0 - 52.0 %   MCV 96.3  78.0 - 100.0 fL   MCH 29.3  26.0 - 34.0 pg   MCHC 30.4  30.0 - 36.0 g/dL   RDW 19.8 (*) 11.5 - 15.5 %   Platelets 110 (*) 150 - 400 K/uL   Comment: CONSISTENT WITH PREVIOUS RESULT  GLUCOSE, CAPILLARY     Status: Abnormal   Collection Time    04/17/13 11:40 AM      Result Value Ref Range   Glucose-Capillary 174 (*) 70 - 99 mg/dL  GLUCOSE, CAPILLARY     Status: Abnormal   Collection Time    04/17/13  4:21 PM      Result Value Ref Range   Glucose-Capillary 156 (*) 70 - 99 mg/dL  GLUCOSE, CAPILLARY     Status: Abnormal   Collection Time    04/17/13 10:21 PM      Result Value Ref Range   Glucose-Capillary 150 (*) 70 - 99 mg/dL  GLUCOSE, CAPILLARY     Status: Abnormal   Collection Time    04/18/13  5:48 AM      Result Value Ref Range   Glucose-Capillary 154 (*) 70 - 99 mg/dL  COMPREHENSIVE METABOLIC PANEL     Status: Abnormal   Collection Time    04/18/13  6:22 AM      Result Value Ref Range   Sodium 142  137 - 147 mEq/L   Potassium 4.1  3.7 - 5.3 mEq/L   Chloride 104  96 -  112 mEq/L   CO2 26  19 - 32 mEq/L   Glucose, Bld 166 (*) 70 - 99 mg/dL   BUN 19  6 - 23 mg/dL   Creatinine, Ser 0.74  0.50 - 1.35 mg/dL   Calcium 8.5  8.4 - 10.5 mg/dL   Total Protein 5.1 (*) 6.0 - 8.3 g/dL   Albumin 2.2 (*) 3.5 - 5.2 g/dL   AST 33  0 - 37 U/L   ALT 49  0 - 53 U/L   Alkaline Phosphatase 74  39 - 117 U/L   Total Bilirubin <0.2 (*) 0.3 - 1.2 mg/dL   GFR calc non Af Amer 87 (*) >90 mL/min   GFR calc Af Amer >90  >90 mL/min   Comment: (NOTE)     The eGFR has been calculated using the CKD EPI equation.     This calculation has not been validated in all clinical situations.     eGFR's persistently <90 mL/min signify possible Chronic Kidney     Disease.  MAGNESIUM     Status: None   Collection Time    04/18/13  6:22 AM      Result Value Ref Range   Magnesium 2.1  1.5 - 2.5 mg/dL  PHOSPHORUS     Status: None   Collection Time    04/18/13  6:22 AM      Result Value Ref Range   Phosphorus 3.8  2.3 - 4.6 mg/dL  DIFFERENTIAL     Status: None   Collection Time    04/18/13  6:22 AM      Result Value Ref Range   Neutrophils Relative % 74  43 - 77 %   Neutro Abs 6.4  1.7 - 7.7 K/uL   Lymphocytes Relative 18  12 - 46 %   Lymphs Abs 1.6  0.7 - 4.0 K/uL   Monocytes Relative 6  3 - 12 %   Monocytes Absolute 0.5  0.1 - 1.0 K/uL   Eosinophils Relative 2  0 - 5 %   Eosinophils Absolute 0.1  0.0 - 0.7 K/uL   Basophils Relative 0  0 - 1 %   Basophils Absolute 0.0  0.0 - 0.1 K/uL  CBC     Status: Abnormal   Collection Time    04/18/13  6:22 AM      Result Value Ref Range   WBC 8.6  4.0 - 10.5 K/uL   RBC 2.75 (*) 4.22 - 5.81 MIL/uL   Hemoglobin 8.1 (*) 13.0 - 17.0 g/dL   HCT 26.4 (*) 39.0 - 52.0 %   MCV 96.0  78.0 - 100.0 fL   MCH 29.5  26.0 - 34.0 pg   MCHC 30.7  30.0 - 36.0 g/dL   RDW 19.6 (*) 11.5 - 15.5 %   Platelets 121 (*) 150 - 400 K/uL  APTT     Status: Abnormal   Collection Time    04/18/13  6:22 AM      Result Value Ref Range   aPTT 92 (*) 24 - 37 seconds    Comment:            IF BASELINE aPTT IS ELEVATED,     SUGGEST PATIENT RISK ASSESSMENT     BE USED TO DETERMINE APPROPRIATE     ANTICOAGULANT THERAPY.  HEPARIN LEVEL (UNFRACTIONATED)     Status: None   Collection Time    04/18/13  6:22 AM      Result Value Ref Range   Heparin Unfractionated 0.35  0.30 - 0.70 IU/mL   Comment:            IF HEPARIN RESULTS ARE BELOW     EXPECTED VALUES, AND PATIENT     DOSAGE HAS BEEN CONFIRMED,     SUGGEST FOLLOW UP TESTING     OF ANTITHROMBIN III LEVELS.  TRIGLYCERIDES     Status: None   Collection Time    04/18/13  6:22 AM      Result Value Ref Range   Triglycerides 114  <150 mg/dL  GLUCOSE, CAPILLARY     Status: Abnormal   Collection Time    04/18/13 11:30 AM      Result Value Ref Range   Glucose-Capillary 181 (*) 70 - 99 mg/dL   No results found.  Review of Systems  Constitutional: Positive for weight loss.  HENT: Positive for hearing loss.   Respiratory: Positive for shortness of breath.   Cardiovascular: Negative for chest pain.  Gastrointestinal: Positive for abdominal pain. Negative for nausea and vomiting.  Neurological: Positive for weakness.    Blood pressure 113/76, pulse 117, temperature 98 F (36.7 C), temperature source Oral, resp. rate 22, height 5' 8.9" (1.75 m), weight 92.08 kg (203 lb), SpO2 90.00%. Physical Exam  Constitutional: He is oriented to person, place, and time.  HOH Wearing trach collar  Cardiovascular: Normal rate and regular rhythm.   No murmur heard. Respiratory: Effort normal. He has wheezes.  GI: Soft. Bowel sounds are normal.  Musculoskeletal: Normal range of motion.  Neurological: He is alert and oriented to person, place, and time.  Skin: Skin is warm and dry.  Psychiatric: He has  a normal mood and affect. His behavior is normal. Thought content normal.     Assessment/Plan Malnutrition Refuses to eat- causing protein deficiency Poor healing Will return to SNF- long term care Hx resp  failure; colectomy; fistula; drains Existing trach collar Afib--On heparin drip Scheduled now for percutaneous gastric tube placement Pt is aware of placement and agreeable- but very hard of hearing Call into son for consent Check kub Ancef ordered Hep off at [place] 04/18/2013, 12:59 PM

## 2013-04-18 NOTE — Progress Notes (Signed)
NUTRITION FOLLOW UP  DOCUMENTATION CODES  Per approved criteria   -Obesity Unspecified    INTERVENTION:  TPN per pharmacy; wean with advancement of EN Once PEG is placed and ready to use, recommend initiation of Vital AF 1.2 at 20 ml/hr, advance by 10 ml q 4 hours, to goal of 70 ml/hr. Goal rate will provide: 2016 kcal, 126 grams protein, 1362 ml free water. Once all IVF discontinued, recommend free water flushes of 200 ml QID via tube. Continue Ensure Pudding po TID, will change to prn as pt often refuses RD to follow for nutrition care plan  NUTRITION DIAGNOSIS: Inadequate oral intake now related to dysphagia, poor appetite as evidenced by 0-20%, ongoing  Goal: Pt to meet >/= 90% of their estimated nutrition needs, progressing   Monitor:  TPN prescription, PO intake, weight, labs, I/O's  ASSESSMENT: 79 yo male with recent complications at [facility] after an ablation for afib in Oct 2014 he developed a volvulus after the ablation was intubated/trached; required multiple abd surgeries then developed fistula.   Has been on TPN, with NGT on/off since. Is at [facility] and sent to [facility] for complaints of abd pain. Pt was taken off TPN and NGT was removed last week. He has been eating by mouth and doing well. Last 4 days with worsening abd pain, no N/V/D.  Patient upgraded to Dys 2-nectar thick liquid diet.  PO intake remains very poor at 0-25% per flowsheet records. Pt reports that he is not eating well because "the food is not good." Per SLP, suspects that poor intake is not Dysphagia 2 related, just that he has no enjoyment from food and the flavors taste different to him.  Patient refused all meals 4/3 and 4/4.  Tentative G-tube placement 4/7.  Continues on trach collar.  Patient is receiving TPN with Clinimix E 5/15 @ 60 ml/hr and lipids @ 10 ml/hr. Provides 1502 kcal and 72 grams protein per day. Meets 73% minimum estimated energy needs and 65% minimum estimated protein needs. PharmD is not  planning to advance TPN, as pt is to start EN soon via PEG.  Sodium, potassium, phosphorus, magnesium WNL Prealbumin 18.6 - WNL Triglycerides WNL CBG's: 181, 154, 150  Height: Ht Readings from Last 1 Encounters:  03/30/13 5' 8.9" (1.75 m)    Weight -----> trending up, but overall stable Wt Readings from Last 1 Encounters:  04/18/13 203 lb (92.08 kg)  3/30  186 lb 3/29  191 lb 3/27  194 lb 3/26  192 lb 3/24  195 lb 3/18  203 lb 3/17  203 lb  BMI:  Body mass index is 30.07 kg/(m^2). Obese Class I  Re-estimated needs: Kcal: 2050-2250 Protein: 110-120 gm Fluid: 2.0-2.2 L  Skin:  stage II pressure ulcer to L + R buttocks Closed R abdominal incision Open mid-lower abdominal incision  Diet Order: Dysphagia 2, Nectar Thick Liquids   Intake/Output Summary (Last 24 hours) at 04/18/13 1517 Last data filed at 04/18/13 0600  Gross per 24 hour  Intake 2213.07 ml  Output    700 ml  Net 1513.07 ml   Last BM: 4/6 (diarrhea, c diff positive)  Labs:   Recent Labs Lab 04/15/13 0530 04/16/13 0512 04/17/13 1120 04/18/13 0622  NA 144 145 142 142  K 3.3* 3.6* 4.0 4.1  CL 105 106 104 104  CO2 27 28 30 26   BUN 16 16 17 19   CREATININE 0.59 0.62 0.66 0.74  CALCIUM 8.3* 8.4 8.3* 8.5  MG 2.0 2.0  --  2.1  PHOS 3.5 3.5  --  3.8  GLUCOSE 158* 171* 182* 166*    CBG (last 3)   Recent Labs  04/17/13 2221 04/18/13 0548 04/18/13 1130  GLUCAP 150* 154* 181*   Prealbumin  Date/Time Value Ref Range Status  04/18/2013  6:22 AM 18.6  17.0 - 34.0 mg/dL Final     [facility]   Triglycerides  Date/Time Value Ref Range Status  04/18/2013  6:22 AM 114  <150 mg/dL Final    Scheduled Meds: . amiodarone  200 mg Oral BID  . atorvastatin  40 mg Oral Daily  . [START ON 04/19/2013]  ceFAZolin (ANCEF) IV  2 g Intravenous On Call  . chlorhexidine  15 mL Mouth/Throat BID  . diltiazem  240 mg Oral BID  . feeding supplement (ENSURE)  1 Container Oral TID BM  .  hydrocortisone cream   Topical BID  . insulin aspart  0-9 Units Subcutaneous TID WC  . metoprolol tartrate  12.5 mg Oral BID  . metroNIDAZOLE  500 mg Oral 3 times per day  . pantoprazole  40 mg Oral Daily  . predniSONE  5 mg Oral Q breakfast  . roflumilast  500 mcg Oral Daily  . sodium chloride  10-40 mL Intracatheter Q12H    Continuous Infusions: . sodium chloride    . dextrose 5 % and 0.45 % NaCl with KCl 40 mEq/L 50 mL/hr at 04/17/13 2025  . Marland Kitchen.TPN (CLINIMIX-E) Adult 60 mL/hr at 04/17/13 1730   And  . fat emulsion 250 mL (04/17/13 1730)  . Marland Kitchen.TPN (CLINIMIX-E) Adult     And  . fat emulsion    . heparin 1,250 Units/hr (04/18/13 0856)    Jarold MottoSamantha Deysha Cartier MS, RD, LDN Inpatient Registered Dietitian Pager: [phone] After-hours pager: [phone]

## 2013-04-18 NOTE — Significant Event (Addendum)
Rapid Response Event Note  Overview: Time Called: 2206 Arrival Time: 2208 Event Type: Respiratory  Initial Focused Assessment:  Called by Jorja Loaim, RT for a "second set of eyes" due to increased oxygen requirements from 40%TC to 60%Trach collar.  On arrival to room pt is alert, lying in bed with HOB elevated.  C/O abdominal pain, denies Chest pain. Nods head yes to mild increase in SOB.  Respiratory rate 24, unlabored with use of diaphragmatic and abdominal muscles.  Bilateral breath sounds clear in upper fields and diminished in bases.  Had been suctioned for small amount thin, white secretions from trach per RT.  Pt  O2 sats on 60% 95-96 with continuous pulse ox, HR 110.  Afebrile 98.0  116/73.     Interventions:  Repositioned pt in bed,recommended vigorous pulmonary toilet.If pt desaturates and requires further increases in Oxygen requirement may consider obtaining order for ABG/CXR.  Handoff report to Benton HeightsKimberly, Charity fundraiserN.  Will continue to follow.  2345: F/U  Pt continues to maintain O2 sats 95-96% on 60% trach collar.    Event Summary:   at      at    Outcome: Stayed in room and stabalized  Event End Time: 2235  Jules Schickrivette, Minh Roanhorse K

## 2013-04-18 NOTE — Progress Notes (Signed)
ANTICOAGULATION CONSULT NOTE -follow up Pharmacy Consult for convert xarelto to UFH for PEG placement Indication: atrial fibrillation  Allergies  Allergen Reactions  . Corticosteroids     Inhaled Corticosteroids--Hoarseness, dry mouth  . Furosemide Other (See Comments)    Ototoxicity     Patient Measurements: Height: 5' 8.9" (175 cm) Weight: 203 lb (92.08 kg) IBW/kg (Calculated) : 70.47 Heparin Dosing Weight: 78 kg  Vital Signs: Temp: 98 F (36.7 C) (04/06 0554) Temp src: Oral (04/06 0554) BP: 113/76 mmHg (04/06 0554) Pulse Rate: 92 (04/06 0814)  Labs:  Recent Labs  04/16/13 0512  04/16/13 0900 04/16/13 1230 04/17/13 1100 04/17/13 1120 04/18/13 0622  HGB  --   < > 8.3*  --   --  8.0* 8.1*  HCT  --   --  26.4*  --   --  26.3* 26.4*  PLT  --   --  109*  --   --  110* 121*  APTT  --   --   --  30 72*  --  92*  LABPROT  --   --   --  14.3  --   --   --   INR  --   --   --  1.13  --   --   --   HEPARINUNFRC  --   --   --  0.22* 0.28*  --  0.35  CREATININE 0.62  --   --   --   --  0.66 0.74  < > = values in this interval not displayed.  Estimated Creatinine Clearance: 86.5 ml/min (by C-G formula based on Cr of 0.74).   Assessment: Tim Short is well known to pharmacy.  PTA he was on pradaxa for afib stroke prevention and this was changed to xarelto because he had trouble swallowing the pradaxa pills.  He was transitioned to UFH on 4/4 so he can have a PEG placed for enteral nutrition support.    Will start dosing heparin based on HL results since HL and APTT since results indicated that Xarelto has been cleared. Heparin infusion currently running at 1200 units/hr. HL this AM 0.35. Hg remains low at 8.1, pltc remains low at 121 but trending up. HIT level in process from 4/3 due to thrombocytopenia. No bleeding noted.  Goal of Therapy:  Heparin level 0.3-0.7 units/ml Monitor platelets by anticoagulation protocol: Yes   Plan:  1. Increase heparin drip to 1250  units/hr 2. Daily HL and CBC while on heparin 3. F/u HIT panel from 4/3 - has not gotten heparin  4. Monitor for s/s of bleeding   Vinnie LevelBenjamin Hamp Moreland, PharmD.  Clinical Pharmacist Pager [phone]

## 2013-04-18 NOTE — Progress Notes (Signed)
PROGRESS NOTE  Tim Short:[id number] DOB: [birth year] DOA: 03/28/2013 PCP:  Duane Lope, MD  Assessment/Plan: Atrial fibrillation/flutter  - appreciate cardiology/EP following  -s/p cardioversion 3/27, restored sinus rhythm and maintained over the weekend -04/11/13 am--he is back in A fib with RVR, rates in the 130s.  -Cardiology reconsulted, appreciate input.  - continue amiodarone 200mg  bid per EP  -Defer chronotropic control to cardiology--diltiazem CD increased to 240bid  -HR improving 100-110 with addition of metoprolol tartrate  -Continue rivaroxaban  -add metoprolol tartrate 12.5 mg by mouth daily  -Discontinued rivaroxaban and started the patient on IV heparin in preparation for gastrostomy tube placement  -Consult IR for gastrostomy tube placement--tentatively planned for 04/19/2013  Clostridium difficile colitis  -Flagyl started 04/16/2013  -Partly contributing to abdominal pain  -fewer BMs, but still loose stools Abdominal pain  - secondary to ileus, partial SBO as observed on admission as well as Clostridium difficile colitis presently  -ileus/SBO resolved, patient with bowel movements  -has a degree of abdominal pain which is stable.  - continue supportive care with analgesia, antiemetics as needed for symptom control.  - nutrition consulted, appreciate input,  -patient is not meeting his nutritional needs based on oral intake.  - patient with intermittent confusion last week, which is now better, and on anticoagulation for his A fib  -Consult IR for gastrostomy tube placement--tentatively planned on 04/19/13 -TPN initiated 3/26 and that can be weaned off if po intake improves and pt improves clinically  -long discussion with son regarding risks and benefits of enteral feeding vs TPN--he is amenable to gastrostomy tube placement  -continue oral intake as tolerated, had another swallow evaluation 3/30, his diet was changed from Dys1 to Dys 2,  Moderate  malnutrition  - secondary to acute on chronic illness as outlined above  - pt tolerating well however does not seem to meet his nutritional requirements per nutrition  - on TPN, appreciate nutrition and pharmacy consults.  -Ultimately plan to switch the patient enteral feeding  Thrombocytopenia  -Suspect myelosuppression due to acute medical illness--stable, slowly improving  -Check serum B12--558  -RBC folate--950  -Peripheral smear--no schistocytes  -Fibrinogen--355  -HIT panel--results pending  -INR 1.14  Leukocytosis  - likely secondary to ileus, HCAP as suggestive per CXR 3/20  - CT abd/pelvis with mild leak around anastomosis but no other acute events, and surgery signed off  - leukocytosis has now resolved  Dysuria  -UA neg for pyuria  HCAP -  -Finished one week cefepime/levoflox and vancomycin 04/07/2013  -Remains afebrile without any respiratory distress  Chronic respiratory failure  -Continue trach collar 35% oxygenation  -clinically stable--SaO2 95-96%  Pulmonary vascular congestion - lung exam improving and stable.  - IVF stopped 3/20 due to pulmonary vascular congestion  - monitor I's/O's, daily weights  - weight trend: 203 lbs --> 195 --> 192 >> 194 >> 191 >>186  - repeat CXR 3/29 stable  Hypokalemia - continue supplementation. Magnesium normal.  Sacral ulcer - skin breakdown noted, closely monitor. Nursing care. No evidence of infection.  Anemia of chronic disease - no signs of active bleeding. Likely multifactorial ?medication induced, nutritional deficiencies. Closely monitor.  Essential hypertension - -controlled Tracheostomy status  -pt requires frequent suctioning--will benefit going to LTAC  -Has had numerous episodes of mucous plugging requiring aggressive suctioning  -concerned about ability to provide level of care he needs at [facility]  - Patient with a desaturation event 3/30 evening requiring suctioning.  -  pulmonary toilet  Hypernatremia - TPN per  pharmacy. Improving, supplemented 3/30 with D5W.  Family Communication: updated son on phone total time 35 min  Disposition Plan: [name] vs [facility]         Procedures/Studies: Ct Abdomen Pelvis W Contrast  04/04/2013   CLINICAL DATA:  Liver flight non.  Leukocytosis.  EXAM: CT ABDOMEN AND PELVIS WITH CONTRAST  TECHNIQUE: Multidetector CT imaging of the abdomen and pelvis was performed using the standard protocol following bolus administration of intravenous contrast.  CONTRAST:  90mL OMNIPAQUE IOHEXOL 300 MG/ML  SOLN  COMPARISON:  DG ABD PORTABLE 1V dated 04/04/2013; CT ABD/PELVIS W CM dated 03/28/2013; CT ABD-PELV W/O CM dated 03/11/2013; CT ABD/PELVIS W CM dated 01/30/2013  FINDINGS: Stable moderate left and small right pleural effusions with associated passive atelectasis. Mild cardiomegaly. Postoperative findings along the gastroesophageal junction.  Slightly reduce marginal definition of the infiltrative hypodense process involving the right hepatic lobe and portions of segment 4 of the liver.  The spleen, adrenal glands, and pancreas unremarkable. Small amount of fluid along the gallbladder fossa and along the inferior edge of the right hepatic lobe could, similar to prior. Gallbladder surgically absent.  There is contrast medium in the renal collecting systems on the initial portal venous phase images, likely due to inadvertent early injection.  Stable renal hypodensities. Infrarenal IVC filter. Continued fluid along the inferior margin of the laparotomy wound. Trace fluid in the lower omentum. Stable trace presacral edema.  Bilateral chronic pars defects at L5 observed with 9 mm of anterolisthesis.  Orally administered contrast extends through to the rectum. Mild circumferential rectal wall thickening.  The patient seems to have had right hemicolectomy, with reanastomosis to the small bowel on image 39 of series 5. Outpouching from the proximal terminal margin of the remaining colon shown  on images 32 through 21 of series 5, potentially some type of postoperative diverticulum or contained leak along the stable site.  IMPRESSION: 1. Similar distribution but reduced conspicuity of the peripheral infiltrative hepatic hypodensity affecting the right hepatic lobe an adjacent portion of segment 4. Fatty infiltration is the most common cause for and infiltrative hypodensity of this type, although localized parenchymal inflammation is not entirely excluded. MRI could differentiate if clinically warranted. 2. Small amount of perihepatic ascites, similar to prior. 3. Hypodense renal lesions, similar to prior. 4. Patient appears to have had right hemicolectomy with small bowel reanastomosis. There is a collection of gas and contrast extending to the right of the proximal terminal margin of the remaining colon, potentially a small contained leak along the staple line. 5. Continued fluid along the inferior margin of the laparotomy wound, but without internal gas density currently.   Electronically Signed   By: Herbie Baltimore M.D.   On: 04/04/2013 18:40   Ct Abdomen Pelvis W Contrast  03/28/2013   CLINICAL DATA:  Abdominal distention.  EXAM: CT ABDOMEN AND PELVIS WITH CONTRAST  TECHNIQUE: Multidetector CT imaging of the abdomen and pelvis was performed using the standard protocol following bolus administration of intravenous contrast.  CONTRAST:  OMNIPAQUE IOHEXOL 300 MG/ML  SOLN  COMPARISON:  DG ABD ACUTE W/CHEST dated 03/28/2013; CT ABD-PELV W/O CM dated 03/11/2013; CT ABD/PELVIS W CM dated 01/30/2013  FINDINGS: There is a new lucency is again noted in the liver. This remains unchanged in appearance and may be infectious in etiology. No clearcut fluid collection in or air collection is noted in the liver to suggest a definite abscess. These changes  may be related to phlegmon. Close follow up to evaluate for developing abscess suggested. Hepatic veins patent. Splenic and portal veins patent. No focal  significant splenic abnormality. Pancreas normal. Cholecystectomy. No biliary distention.  Adrenals normal. Stable approximate 13 mm low-density lesion in the left kidney, not definite simple cyst. As noted on prior study, MRI of the kidneys suggest for further evaluation. No hydronephrosis. The bladder is nondistended. No free pelvic fluid. Prostate is not enlarged. Calcifications within the prostate. No significant adenopathy. Abdominal aorta is atherosclerotic. No aneurysm. Visceral vessels are patent. Inferior vena caval filter noted in the IVC with tip just below the renal veins.  Previously identified drainage catheter in the right upper quadrant has been removed. Inflammatory changes are noted in the right mid abdomen, no abscess noted. The patient has had a prior hemicolectomy. Reference is made to CT report of 01/31/2012 which discusses fistula changes within the abdomen. Patient status post right hemicolectomy. The colon is moderately distended and contains fluid levels. These changes have improved from prior exam suggesting improving ileus. These changes may be related to a diarrheal illness. Follow-up abdominal series suggested. No small bowel distention. The stomach is nondistended. No free air noted.  Atelectasis and/or infiltrates in the lung bases. Bilateral pleural effusions. Cardiomegaly. Coronary artery disease. Midline abdominal scar noted. Fluid noted in the region of the scar with associated small amount of air. Developing abscess should be considered. These findings have progressed slightly from prior study. Multiple anterior abdominal wall subcutaneous nodules most likely injection granulomas.  IMPRESSION: 1. Findings consistent with persistent phlegmon in the liver. 2. Interim removal of right upper quadrant drainage catheter. 3. Colonic distention with air-fluid levels. These findings have improved from prior study suggesting improving adynamic ileus or diarrheal illness. Followup abdominal  series suggested. 4. Slight progression of soft tissue fluid in the anterior abdomen in the region the patient's scarring. This could represent developing phlegmon/abscess. Small amount of air is noted within this fluid collection. 5. 13 mm low-density lesion left kidney, not definite simple cyst. As noted on prior study nonenhanced and enhanced MRI of the kidneys suggested for further evaluation.   Electronically Signed   By: Maisie Fus  Register   On: 03/28/2013 23:29   Dg Chest Port 1 View  04/10/2013   CLINICAL DATA:  Cough and congestion.  EXAM: PORTABLE CHEST - 1 VIEW  COMPARISON:  DG CHEST 1V PORT dated 04/03/2013  FINDINGS: Tracheostomy and left-sided PICC line positioning are stable. The right jugular central line has been removed since the prior chest x-ray. Lungs show lower volumes bilaterally with increased prominence of bilateral lower lobe atelectasis. There may be a component of left-sided pleural fluid. The heart remains moderately enlarged with evidence of prior placement of a left atrial appendage clip. No overt pulmonary edema is identified.  IMPRESSION: Lower volumes with increased prominence of bilateral lower lobe atelectasis. There may be a component of left-sided pleural fluid.   Electronically Signed   By: Irish Lack M.D.   On: 04/10/2013 11:29   Dg Chest Port 1 View  04/03/2013   CLINICAL DATA:  Central venous catheter placement  EXAM: PORTABLE CHEST - 1 VIEW  COMPARISON:  DG CHEST 1V PORT dated 04/01/2013  FINDINGS: Left upper extremity PICC is been placed. Tip is in the lower SVC. Tracheostomy tube and left atrial appendage clip stable. Left pleural effusion and basilar consolidation stable. Vascular congestion increased.  IMPRESSION: Left PICC placed with its tip at the lower SVC.  Increased vascular  congestion.  Stable left basilar consolidation and left pleural effusion.   Electronically Signed   By: Maryclare Bean M.D.   On: 04/03/2013 09:04   Dg Chest Port 1 View  04/01/2013    CLINICAL DATA:  Leukocytosis  EXAM: PORTABLE CHEST - 1 VIEW  COMPARISON:  03/28/2013  FINDINGS: Postsurgical changes are again seen. A right-sided jugular central line is noted in the right innominate vein stable from the prior exam. The cardiac shadow is stable. Left basilar consolidation with increasing effusion is noted.  IMPRESSION: Increasing left basilar infiltrate and effusion.   Electronically Signed   By: Alcide Clever M.D.   On: 04/01/2013 09:10   Dg Abd 2 Views  03/30/2013   CLINICAL DATA:  Colonic distention.  Multiple abdominal surgeries.  EXAM: ABDOMEN - 2 VIEW  COMPARISON:  DG ABD 2 VIEWS dated 03/29/2013; CT ABD/PELVIS W CM dated 03/28/2013  FINDINGS: Trace blunting of the right costophrenic angle. No free intraperitoneal gas.  Dilated loops of left upper quadrant small bowel measure up to 4 cm, similar to the prior exam. There are several air-fluid levels within these small bowel loops. Reduced colonic gas compared to the prior exam. IVC filter noted.  IMPRESSION: 1. Mildly dilated loops of left upper quadrant small bowel with scattered internal air-fluid levels, similar to the prior exam, with abnormal but nonspecific pattern which could be due to could ileus or partial small bowel obstruction. Correlate with bowel sounds and bowel function.   Electronically Signed   By: Herbie Baltimore M.D.   On: 03/30/2013 10:43   Dg Abd 2 Views  03/29/2013   CLINICAL DATA:  Lower abdominal pain. Nausea. Shortness of breath. Weakness.  EXAM: ABDOMEN - 2 VIEW  COMPARISON:  Abdominal radiograph 03/28/2013.  FINDINGS: Some colonic gas is noted. However, there are multiple borderline dilated of mildly dilated loops of gas-filled small bowel measuring up to 4.2 cm in diameter throughout the central abdomen and left side of the abdomen. No pneumoperitoneum appreciated on the left lateral decubitus view. Several small air-fluid levels are noted. Residual iodinated contrast material is noted within the lumen of the  urinary bladder related to yesterday's CT examination. Numerous surgical clips are noted in the right upper quadrant of the abdomen. IVC filter projecting over either the right side at L2-L3.  IMPRESSION: 1. Nonspecific bowel gas pattern, as above, suggestive of partial small bowel obstruction. 2. No pneumoperitoneum.   Electronically Signed   By: Trudie Reed M.D.   On: 03/29/2013 13:38   Dg Abd Acute W/chest  03/28/2013   CLINICAL DATA:  Evaluate for a bowel obstruction.  EXAM: ACUTE ABDOMEN SERIES (ABDOMEN 2 VIEW & CHEST 1 VIEW)  COMPARISON:  None.  FINDINGS: Tracheostomy tube tip is above the carina. Heart size is mildly enlarged. The lung volumes are low. There is asymmetric elevation of the left hemidiaphragm. Left pleural effusion is noted.  Patient has an IVC filter. There are persistent abnormally dilated loops of small bowel which measure up to 5.5 cm. Gas is noted within the colon and rectum. There is no evidence of dilated bowel loops or free intraperitoneal air. No radiopaque calculi or other significant radiographic abnormality is seen. Heart size and mediastinal contours are within normal limits. Both lungs are clear.  IMPRESSION: 1. Persistent small bowel dilatation compatible with either partial small bowel obstruction or ileus. 2. No change in aeration to the lungs compared with previous exam   Electronically Signed   By: Veronda Prude.D.  On: 03/28/2013 18:56   Dg Abd Portable 1v  04/07/2013   CLINICAL DATA:  Abdominal pain.  EXAM: PORTABLE ABDOMEN - 1 VIEW  COMPARISON:  None.  FINDINGS: The bowel gas pattern is normal. IVC filter seen at the level of L3. Surgical clips from prior cholecystectomy noted. No radio-opaque calculi or other significant radiographic abnormality are seen.  IMPRESSION: No acute findings.  Unremarkable bowel gas pattern.   Electronically Signed   By: Myles Rosenthal M.D.   On: 04/07/2013 15:26   Dg Abd Portable 1v  04/04/2013   CLINICAL DATA:  Evaluate barium   EXAM: PORTABLE ABDOMEN - 1 VIEW  COMPARISON:  03/30/2013  FINDINGS: Contrast material is noted within the transverse colon and splenic flexure of the colon. Small bowel loop distention has improved. Slight prominence of left abdominal small bowel loops persists. No free air. No organomegaly. Prior cholecystectomy.  IMPRESSION: Improving small bowel distention. Small amount of oral contrast material within the transverse colon and splenic flexure of the colon.   Electronically Signed   By: Charlett Nose M.D.   On: 04/04/2013 10:18   Dg Swallowing Func-speech Pathology  04/11/2013   Riley Nearing Deblois, CCC-SLP     04/11/2013  3:10 PM Objective Swallowing Evaluation: Modified Barium Swallowing Study   Patient Details  Name: Tim Short MRN: [id number] Date of Birth: 04/10/1934  Today's Date: 04/11/2013 Time: 4098-1191 SLP Time Calculation (min): 25 min  Past Medical History:  Past Medical History  Diagnosis Date  . Hypertension   . Hypercholesteremia   . Asthma   . Meniere disease   . Persistent atrial fibrillation     a. s/p multiple dccv's;  b. failed amio;  c. not felt to be AF  RFCA canddiate due to LA dil;  d. s/p failed hybrid ablation @  [facility] in 09/2012;  e. chronic pradaxa.  . Obesity   . Biatrial enlargement     LA size 5.3cm  . Obstructive sleep apnea     AHI 108/hr now on CPAP at 12cm H2O  . H/O hiatal hernia   . GERD (gastroesophageal reflux disease)     "associated w/hiatal hernia" (Jun 28, 2012)  . Peptic ulcer 1950's  . Migraines     "haven't had one for about 15 years" (June 28, 2012)  . Arthritis     "joints" (2012-06-28)  . Chronic lower back pain   . Nephrolithiasis ~ 1960's    "passed on their own" (28-Jun-2012)  . History of pneumonia 2002; 2006    "spent 8 days in isolation; Norovirus" (2012-06-28)  . Other and unspecified angina pectoris   . RLS (restless legs syndrome)   . Coronary artery disease     a. 05/2012 Cath/PCI: LM nl, LAD nl, LCX 38m (3.0x15 Integrity  BMS), PTCA of OM1 through stent struts  (kissing balloon).  . Diabetes mellitus without complication     FAMILY STATES PATIENT IS NOT DIABETIC   Past Surgical History:  Past Surgical History  Procedure Laterality Date  . Wrist fracture surgery  1992    "repaired w/left hip bone graft" (2012-06-28)  . Total knee arthroplasty  08/19/1999  . Colonoscopy w/ polypectomy  2006  . Knee arthroscopy with meniscal repair Right 1974    "medial meniscus repaired" (28-Jun-2012)  . Cardioversion  10/02/2011    Procedure: CARDIOVERSION;  Surgeon: Corky Crafts, MD;   Location: [facility] ENDOSCOPY;  Service: Cardiovascular;  Laterality:  N/A;  h/p in file drawer  . Cardioversion  11/07/2011  Procedure: CARDIOVERSION;  Surgeon: Corky CraftsJayadeep S. Varanasi, MD;   Location: [facility] ENDOSCOPY;  Service: Cardiovascular;  Laterality:  N/A;  h/p from 10/22 in file drawer/dl  . Cardiac catheterization  05/26/2012  . Coronary angioplasty with stent placement  06/04/2012    "1" (06/04/2012)  . Cataract extraction w/ intraocular lens  implant, bilateral   2009  . Hemorrhoid surgery  ~ 2003  . Hiatal hernia repair  1983  . Nissen fundoplication  1983  . Ablation of dysrhythmic focus  SEPT/OCT 2014    ATRIAL FIB  . Inguinal hernia repair Bilateral 2000 and 2005    "one done at a time" (06/04/2012)  . Colon surgery  10/08/2012   . 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 [facility], unable to wean of the vent  and requiring trach placement 10/29/2012. He was transferred to  [facility] in December 2014 and later to [facility] in late January  2015. He was on TNA and was doing fairly well initially, but  developed more abdominal pain and on 2/23 taken to OR in [place]  for gallbladder removal  (secondary to cholecystitis), resection  for part of the colon for fistula, IVC filter placement. Sent  back to [facility] on march 4th, 2015 and started on TPN. Diet  advanced on March 9th, 2015 after pt passed swallow evaluation.  [facility], after noticing progressive  abdominal distension. In ED, CT abdomen with air- fluid level and  findings consistent with early partial SBO, ileus. Surgery  reports pt is ok for PO from their standpoint, SLP ordered for  swallow eval.      Assessment / Plan / Recommendation Clinical Impression  Dysphagia Diagnosis: Moderate pharyngeal phase dysphagia Clinical impression: Pt demonstrates significant improvement in  motor function since last MBS. Pts primary decifits are now  exclusively sensory in nature. Pt has a delay in swallow  initiation with nectar and thin teaspoon amounts with aspiration  before the swallow. With a chin tuck, the pt is albe to protect  the airway before the swallow with nectar vea teaspoon or straw.  Pt is also able to masticate soft and regular textured solids. He  was noted to have expectoration of mucous/barium after the test,  possible esophageal component not visualized due to body habitus.  Pt is recommended to upgrade to a dys 2 Finely chopped diet with  nectar thick liquids via spoon or straws with full supervison for  a chin tuck and placement of PMSV. Pt may upgrade solids if he  tolerates dys 2 diet.     Treatment Recommendation  Therapy as outlined in treatment plan below    Diet Recommendation Dysphagia 2 (Fine chop);Nectar-thick liquid   Liquid Administration via: Spoon;Straw Medication Administration: Crushed with puree Supervision: Patient able to self feed;Full supervision/cueing  for compensatory strategies Compensations: Slow rate;Small sips/bites Postural Changes and/or Swallow Maneuvers: Chin tuck;Seated  upright 90 degrees    Other  Recommendations Oral Care Recommendations: Oral care BID Other Recommendations:  Place PMSV during PO intake;Order  thickener from pharmacy;Have oral suction available   Follow Up Recommendations  Skilled Nursing facility    Frequency and Duration min 2x/week  2  weeks   Pertinent Vitals/Pain NA    SLP Swallow Goals     General HPI: Pt is 79 yo male with fairly recent hospitalization  at [facility] in September 2014 for failed maize procedure and during  that hospitalization pt developed volvulus and has required right  colectomy (10/08/2012), subsequently 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 Pt was in  prolonged respiratory failure at [facility], unable to wean of the vent  and requiring trach placement 10/29/2012. He was transferred to  [facility] in December 2014 and later to [facility] in late January  2015. He was on TNA and was doing fairly well initially, but  developed more abdominal pain and on 2/23 taken to OR in [place]  for gallbladder removal (secondary to cholecystitis), resection  for part of the colon for fistula, IVC filter placement. Sent  back to [facility] on march 4th, 2015 and started on TPN. Diet  advanced on March 9th, 2015 after pt passed swallow evaluation.  He was brought to [facility] March 16th, after noticing progressive  abdominal distension. In ED, CT abdomen with air- fluid level and  findings consistent with early partial SBO, ileus. Surgery  reports pt is ok for PO from their standpoint, SLP ordered for  swallow eval.  Type of Study: Modified Barium Swallowing Study Reason for Referral: Objectively evaluate swallowing function Previous Swallow Assessment: [facility] hospital - 3/16 pt "passed"  FEES and was started on diet, began to decline and blue dye test  was given, pt silently aspirated thin, nectar, honey per report.  Diet Prior to this Study: Dysphagia 1 (puree);Pudding-thick  liquids Temperature Spikes Noted: No Respiratory Status: Trach collar Trach Size and Type: Cuff;Deflated;With PMSV in place;#6;Other  (Comment) History of Recent Intubation:  No Behavior/Cognition: Alert;Cooperative;Requires cueing;Hard of  hearing Oral Cavity - Dentition: Missing dentition Oral Motor / Sensory Function: Impaired - see Bedside swallow  eval Self-Feeding Abilities: Able to feed self;Needs assist Patient Positioning: Upright in chair Baseline Vocal Quality: Low vocal intensity Volitional Cough: Strong Volitional Swallow: Able to elicit Anatomy: Within functional limits Pharyngeal Secretions: Not observed secondary MBS    Reason for Referral Objectively evaluate swallowing function   Oral Phase Oral Preparation/Oral Phase Oral Phase: Impaired Oral Phase - Comment Oral Phase - Comment: missing dentition, but places boluse  effectively between remaining teeth. otherwise WFL.    Pharyngeal Phase Pharyngeal Phase Pharyngeal Phase: Impaired Pharyngeal - Honey Pharyngeal - Honey Teaspoon: Not tested Pharyngeal - Honey Cup: Not tested Pharyngeal - Nectar Pharyngeal - Nectar Teaspoon: Delayed swallow  initiation;Penetration/Aspiration before swallow (chin tuck  increases airway closure, preents penetration) Penetration/Aspiration details (nectar teaspoon): Material enters  airway, passes BELOW cords without attempt by patient to eject  out (silent aspiration);Material does not enter airway Pharyngeal - Nectar Straw: Delayed swallow initiation (chin tuck) Pharyngeal - Thin Pharyngeal - Thin Teaspoon: Delayed swallow  initiation;Penetration/Aspiration before swallow (chin tuck) Penetration/Aspiration details (thin teaspoon): Material enters  airway, CONTACTS cords and not ejected out Pharyngeal - Solids Pharyngeal - Puree: Delayed swallow initiation (chin tuck) Pharyngeal - Mechanical Soft: Delayed swallow initiation Pharyngeal - Regular: Delayed swallow initiation  Cervical Esophageal Phase    GO             Harlon Ditty, MA CCC-SLP [phone]  Claudine Mouton 04/11/2013, 3:08 PM    Dg Swallowing Func-speech Pathology  04/02/2013   Breck Coons Wendell, CCC-SLP      04/02/2013  2:03 PM Objective Swallowing Evaluation: Modified Barium Swallowing Study  Patient Details  Name: Tim Short MRN: [id number] Date of Birth: 30-Jun-1934  Today's Date: 04/02/2013 Time: 1225-1250 SLP Time Calculation (min): 25 min  Past Medical History:  Past Medical History  Diagnosis Date  . Hypertension   . Hypercholesteremia   . Asthma   . Meniere disease   . Persistent atrial fibrillation     a. s/p multiple dccv's;  b. failed amio;  c. not felt to be AF  RFCA canddiate due to LA dil;  d. s/p failed hybrid ablation @  [facility] in 09/2012;  e. chronic pradaxa.  . Obesity   . Biatrial enlargement     LA size 5.3cm  . Obstructive sleep apnea     AHI 108/hr now on CPAP at 12cm H2O  . H/O hiatal hernia   . GERD (gastroesophageal reflux disease)     "associated w/hiatal hernia" (06/25/12)  . Peptic ulcer 1950's  . Migraines     "haven't had one for about 15 years" (25-Jun-2012)  . Arthritis     "joints" (2012-06-25)  . Chronic lower back pain   . Nephrolithiasis ~ 1960's    "passed on their own" (June 25, 2012)  . History of pneumonia 2002; 2006    "spent 8 days in isolation; Norovirus" (06-25-12)  . Other and unspecified angina pectoris   . RLS (restless legs syndrome)   . Coronary artery disease     a. 05/2012 Cath/PCI: LM nl, LAD nl, LCX 12m (3.0x15 Integrity  BMS), PTCA of OM1 through stent struts (kissing balloon).  . Diabetes mellitus without complication     FAMILY STATES PATIENT IS NOT DIABETIC   Past Surgical History:  Past Surgical History  Procedure Laterality Date  . Wrist fracture surgery  1992    "repaired w/left hip bone graft" (06-25-12)  . Total knee arthroplasty  08/19/1999  . Colonoscopy w/ polypectomy  2006  . Knee arthroscopy with meniscal repair Right 1974    "medial meniscus repaired" (June 25, 2012)  . Cardioversion  10/02/2011    Procedure: CARDIOVERSION;  Surgeon: Corky Crafts, MD;   Location: [facility] ENDOSCOPY;  Service: Cardiovascular;  Laterality:  N/A;  h/p in file drawer  . Cardioversion   11/07/2011    Procedure: CARDIOVERSION;  Surgeon: Corky Crafts, MD;   Location: [facility] ENDOSCOPY;  Service: Cardiovascular;  Laterality:  N/A;  h/p from 10/22 in file drawer/dl  . Cardiac catheterization  05/26/2012  . Coronary angioplasty with stent placement  06/25/12    "1" (06-25-2012)  . Cataract extraction w/ intraocular lens  implant, bilateral   2009  . Hemorrhoid surgery  ~ 2003  . Hiatal hernia repair  1983  . Nissen fundoplication  1983  . Ablation of dysrhythmic focus  SEPT/OCT 2014    ATRIAL FIB  . Inguinal hernia repair Bilateral 2000 and 2005    "one done at a time" (06/25/2012)  . Colon surgery  10/08/2012   . 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 Pt was in  prolonged respiratory failure at [facility], unable to wean of the vent  and requiring trach placement 10/29/2012. He was transferred to  [facility] in December 2014 and later to [facility] in late January  2015. He was on TNA and was doing fairly  well initially, but  developed more abdominal pain and on 2/23 taken to OR in [place]  for gallbladder removal (secondary to cholecystitis), resection  for part of the colon for fistula, IVC filter placement. Sent  back to [facility] on march 4th, 2015 and started on TPN. Diet  advanced on March 9th, 2015 after pt passed swallow evaluation.  He was brought to [facility] March 16th, after noticing progressive  abdominal distension. In ED, CT abdomen with air- fluid level and  findings consistent with early partial SBO, ileus. Surgery  reports pt is ok for PO from their standpoint, SLP ordered for  swallow eval.      Assessment / Plan / Recommendation Clinical Impression  Dysphagia Diagnosis: Moderate pharyngeal phase dysphagia;Severe   pharyngeal phase dysphagia Clinical impression: MBS completed with pt's PMSV donned and  appears to be congruent with study performed at [facility].  He  demonstrated moderate-severe motor based pharyngeal dyspahgia as  evidenced by reduced tongue base retraction, decreased laryngeal  elevation and decreased pharyngeal contraction.  Impairments led  to mild pharyngeal residue in vallecule/pyriform sinuses and  posterior pharyngeal wall and laryngeal vestibule consistently  silent penetration to vocal cords.  Verbal cues for hard cough  briefly cleared vestibule with subsequent penetration from  residue.  SLP recommends Dys 1 (puree) only, no liquids (pudding  thick liquid) with speaking valve donned, two swallows,  volitional coughs, pills crushed in applesauce and full  supervision/assist.       Treatment Recommendation  Therapy as outlined in treatment plan below    Diet Recommendation Dysphagia 1 (Puree);Pudding-thick liquid   Medication Administration: Crushed with puree Supervision: Patient able to self feed;Full supervision/cueing  for compensatory strategies Compensations: Slow rate;Small sips/bites;Multiple dry swallows  after each bite/sip;Hard cough after swallow Postural Changes and/or Swallow Maneuvers: Seated upright 90  degrees;Upright 30-60 min after meal, wear speaking valve during  all po's    Other  Recommendations Oral Care Recommendations: Oral care BID Other Recommendations: Order thickener from pharmacy   Follow Up Recommendations  Skilled Nursing facility    Frequency and Duration min 2x/week  2 weeks   Pertinent Vitals/Pain WDL            Reason for Referral Objectively evaluate swallowing function   Oral Phase Oral Preparation/Oral Phase Oral Phase: WFL (WFL for textures assessed)   Pharyngeal Phase Pharyngeal Phase Pharyngeal Phase: Impaired Pharyngeal - Honey Pharyngeal - Honey Teaspoon: Reduced pharyngeal  peristalsis;Pharyngeal residue - valleculae;Reduced tongue base   retraction;Penetration/Aspiration during swallow;Pharyngeal  residue - pyriform sinuses;Reduced laryngeal elevation Penetration/Aspiration details (honey teaspoon): Material enters  airway, CONTACTS cords and not ejected out Pharyngeal - Honey Cup: Penetration/Aspiration during  swallow;Pharyngeal residue - valleculae;Pharyngeal residue -  pyriform sinuses;Reduced tongue base retraction;Reduced laryngeal  elevation Penetration/Aspiration details (honey cup): Material enters  airway, remains ABOVE vocal cords then ejected out Pharyngeal - Solids Pharyngeal - Puree: Pharyngeal residue - valleculae;Reduced  tongue base retraction;Pharyngeal residue - pyriform  sinuses;Reduced laryngeal elevation  Cervical Esophageal Phase        Cervical Esophageal Phase Cervical Esophageal Phase: [facility]         Darrow Bussing.Ed CCC-SLP Pager [phone]  04/02/2013         Subjective: Patient complains of chronic shortness of breath abdominal pain which have been unchanged. He denies any fevers, chills, headache, chest pain, vomiting, rashes, dysuria.  Objective: Filed Vitals:   04/18/13 0814 04/18/13 0911 04/18/13 1124 04/18/13 1303  BP:    109/75  Pulse: 92  107 117 111  Temp:    98 F (36.7 C)  TempSrc:    Oral  Resp:  20 22 21   Height:      Weight:      SpO2:  94% 90% 98%    Intake/Output Summary (Last 24 hours) at 04/18/13 1441 Last data filed at 04/18/13 0600  Gross per 24 hour  Intake 2213.07 ml  Output    700 ml  Net 1513.07 ml   Weight change: 1.814 kg (4 lb) Exam:   General:  Pt is alert, follows commands appropriately, not in acute distress  HEENT: No icterus, No thrush, No neck mass, [place]/AT  Cardiovascular: IRRR, S1/S2, no rubs, no gallops  Respiratory: Basilar rales. No wheezing. Good air movement.  Abdomen: Soft/+BS, non tender, non distended, no guarding  Extremities: trace LE edema, No lymphangitis, No petechiae, No rashes, no synovitis  Data Reviewed: Basic Metabolic  Panel:  Recent Labs Lab 04/14/13 0530 04/15/13 0530 04/16/13 0512 04/17/13 1120 04/18/13 0622  NA 148* 144 145 142 142  K 3.6* 3.3* 3.6* 4.0 4.1  CL 109 105 106 104 104  CO2 28 27 28 30 26   GLUCOSE 168* 158* 171* 182* 166*  BUN 19 16 16 17 19   CREATININE 0.57 0.59 0.62 0.66 0.74  CALCIUM 8.6 8.3* 8.4 8.3* 8.5  MG 2.1 2.0 2.0  --  2.1  PHOS 3.5 3.5 3.5  --  3.8   Liver Function Tests:  Recent Labs Lab 04/12/13 0525 04/14/13 0530 04/16/13 0512 04/18/13 0622  AST 20 79* 56* 33  ALT 27 69* 69* 49  ALKPHOS 64 76 80 74  BILITOT 0.4 0.3 0.3 <0.2*  PROT 4.9* 5.0* 4.9* 5.1*  ALBUMIN 2.1* 2.2* 2.1* 2.2*   No results found for this basename: LIPASE, AMYLASE,  in the last 168 hours No results found for this basename: AMMONIA,  in the last 168 hours CBC:  Recent Labs Lab 04/13/13 0605 04/15/13 0530 04/16/13 0900 04/17/13 1120 04/18/13 0622  WBC 7.0 7.5 8.8 8.4 8.6  NEUTROABS  --  5.5  --   --  6.4  HGB 8.5* 8.6* 8.3* 8.0* 8.1*  HCT 27.6* 28.0* 26.4* 26.3* 26.4*  MCV 96.8 96.9 96.0 96.3 96.0  PLT 105* 108* 109* 110* 121*   Cardiac Enzymes: No results found for this basename: CKTOTAL, CKMB, CKMBINDEX, TROPONINI,  in the last 168 hours BNP: No components found with this basename: POCBNP,  CBG:  Recent Labs Lab 04/17/13 1140 04/17/13 1621 04/17/13 2221 04/18/13 0548 04/18/13 1130  GLUCAP 174* 156* 150* 154* 181*    Recent Results (from the past 240 hour(s))  CLOSTRIDIUM DIFFICILE BY PCR     Status: None   Collection Time    04/10/13  9:03 AM      Result Value Ref Range Status   C difficile by pcr NEGATIVE  NEGATIVE Final  URINE CULTURE     Status: None   Collection Time    04/13/13  2:31 PM      Result Value Ref Range Status   Specimen Description URINE, RANDOM   Final   Special Requests NONE   Final   Culture  Setup Time     Final   Value: 04/13/2013 18:52     Performed at [facility] Count     Final   Value: 8,000 COLONIES/ML      Performed at [facility]   Culture     Final  Value: INSIGNIFICANT GROWTH     Performed at [facility]   Report Status 04/14/2013 FINAL   Final  CLOSTRIDIUM DIFFICILE BY PCR     Status: Abnormal   Collection Time    04/16/13  4:44 PM      Result Value Ref Range Status   C difficile by pcr POSITIVE (*) NEGATIVE Final   Comment: CRITICAL RESULT CALLED TO, READ BACK BY AND VERIFIED WITH:     M.CROSSON,RN 1806 04/16/13 M.CAMPBELL     Scheduled Meds: . amiodarone  200 mg Oral BID  . atorvastatin  40 mg Oral Daily  . [START ON 04/19/2013]  ceFAZolin (ANCEF) IV  2 g Intravenous On Call  . chlorhexidine  15 mL Mouth/Throat BID  . diltiazem  240 mg Oral BID  . feeding supplement (ENSURE)  1 Container Oral TID BM  . hydrocortisone cream   Topical BID  . insulin aspart  0-9 Units Subcutaneous TID WC  . metoprolol tartrate  12.5 mg Oral BID  . metroNIDAZOLE  500 mg Oral 3 times per day  . pantoprazole  40 mg Oral Daily  . predniSONE  5 mg Oral Q breakfast  . roflumilast  500 mcg Oral Daily  . sodium chloride  10-40 mL Intracatheter Q12H   Continuous Infusions: . sodium chloride    . dextrose 5 % and 0.45 % NaCl with KCl 40 mEq/L 50 mL/hr at 04/17/13 2025  . Marland KitchenTPN (CLINIMIX-E) Adult 60 mL/hr at 04/17/13 1730   And  . fat emulsion 250 mL (04/17/13 1730)  . Marland KitchenTPN (CLINIMIX-E) Adult     And  . fat emulsion    . heparin 1,250 Units/hr (04/18/13 0856)     Chaun Uemura, DO  [facility] Pager [phone]  If 7PM-7AM, please contact night-coverage [email or web address] Password [email or web address] 04/18/2013, 2:41 PM   LOS: 21 days

## 2013-04-18 NOTE — Progress Notes (Signed)
PARENTERAL NUTRITION CONSULT NOTE - FOLLOW UP  Pharmacy Consult for TPN Indication: inadequate PO intake, resolving SBO  Allergies  Allergen Reactions  . Corticosteroids     Inhaled Corticosteroids--Hoarseness, dry mouth  . Furosemide Other (See Comments)    Ototoxicity     Patient Measurements: Height: 5' 8.9" (175 cm) Weight: 203 lb (92.08 kg) IBW/kg (Calculated) : 70.47   Vital Signs: Temp: 98 F (36.7 C) (04/06 0554) Temp src: Oral (04/06 0554) BP: 113/76 mmHg (04/06 0554) Pulse Rate: 100 (04/06 0554) Intake/Output from previous day: 04/05 0701 - 04/06 0700 In: 2213.1 [I.V.:1416.1; TPN:797] Out: 700 [Urine:700] Intake/Output from this shift:    Labs:  Recent Labs  04/16/13 0900 04/16/13 1230 04/17/13 1100 04/17/13 1120 04/18/13 0622  WBC 8.8  --   --  8.4 8.6  HGB 8.3*  --   --  8.0* 8.1*  HCT 26.4*  --   --  26.3* 26.4*  PLT 109*  --   --  110* 121*  APTT  --  30 72*  --  92*  INR  --  1.13  --   --   --      Recent Labs  04/16/13 0512 04/17/13 1120 04/18/13 0622  NA 145 142 142  K 3.6* 4.0 4.1  CL 106 104 104  CO2 28 30 26   GLUCOSE 171* 182* 166*  BUN 16 17 19   CREATININE 0.62 0.66 0.74  CALCIUM 8.4 8.3* 8.5  MG 2.0  --  2.1  PHOS 3.5  --  3.8  PROT 4.9*  --  5.1*  ALBUMIN 2.1*  --  2.2*  AST 56*  --  33  ALT 69*  --  49  ALKPHOS 80  --  74  BILITOT 0.3  --  <0.2*  TRIG  --   --  114   Estimated Creatinine Clearance: 86.5 ml/min (by C-G formula based on Cr of 0.74).    Recent Labs  04/17/13 1621 04/17/13 2221 04/18/13 0548  GLUCAP 156* 150* 154*    Insulin Requirements in the past 24 hours:  2 units of Novolog + 15 units regular insulin in TPN bag  Current Nutrition:  Dysphagia 2 diet with poor PO intake documented (0-25%) Ensure pudding po TID- each supplement provides 170kcal and 4g protein Clinimix E 5/15 to 60 ml/hr + 20% lipid emulsion at 10 ml/hr (provides 1502kCal ~72% of goal and 72g protein ~65% of goal per day)    Nutritional Goals:  2050-2250 kCal, 110-120 grams of protein per day per RD 4/1  Admit: Transferred from [facility] with abdominal pain, partial SBO, ileus.  GI: Hx GERD, PUD - resolved ileus per surgery.  Fall 2014 - volvulus, multiple abd surgeries, fistula development, was on TPN long-term but recently stopped and was tolerating PO intake per report. Now with poor PO intake. Now on Dysphagia 2 diet w/ no N/V and good bowel movement. Please see RD note 3/27: recs for short-term enteral support via NGT is appropriate as he has a functioning GI tract. Prealbumin pending (low) on 4/6. Planning for PEG tube this week (tentatively on 04/19/13)   Endo: Hx DM (A1c 6.4) - CBGs mostly with SSI + insulin in TPN, continues on daily PO prednisone  Lytes: Na 142, K 4.1, phos and Mg WNL. Corr Ca 9.9, Currently on D5-1/2NS with K @ 32mL/hr for hypernatremia and hypokalemia  Renal: SCr stable at 0.74, UOP 0.78ml/kg/hr  Pulm: Hx asthma, OSA - 95% 0.35 TC -  Daliresp  Cards: Hx afib, HTN, HLD, CAD - BP 113/76, HR 92 (NSR s/p cardioversion 3/27 but now in afib again) - amio, lipitor, dilt, heparin (transitioned from Xarelto for PEG tube placement)  Hepatobil: LFTs now trended down to nl, Tbili WNL, trigs WNL- HIT panel IP  Neuro: Hx RLS, back pain, migraines - A&O  ID: Completed a 7d course of abx for HCAP 3/26 - Afebrile, WBC WNL - flagyl D#1 for Cdiff  Best Practices: Heparin, MC, PPI PO  TPN Access: PICC placed 3/22  TPN day#: 12  Plan:  1. Continue Clinimix E 5/15 to 60 ml/hr + 20% lipid emulsion at 10 ml/hr (provides 1502kCal (~72% of goal) and 72g protein (65% of goal) per day) - will not advance back up to goal at this time since planning on PEG tube, hopefully tomorrow 2. Continue SSI + 15 units insulin in TPN  3. Daily MVI; trace elements M/W/F only due to national shortage  4. F/u AM labs 5. F/u PEG placement and ability to start enteral diet  Tim Short, PharmD.  Clinical  Pharmacist Pager [phone]

## 2013-04-18 NOTE — Progress Notes (Signed)
CSW continuing to follow patient for dc to SNF [facility]. [facility].  Maree KrabbeLindsay Arlie Riker, MSW, Theresia MajorsLCSWA [phone]

## 2013-04-18 NOTE — Progress Notes (Signed)
Orthopedic Tech Progress Note Patient Details:  Tim BarerJames B Short 03/17/1934 [id number] Prafo boot applied to Right LE Ortho Devices Type of Ortho Device: Postop shoe/boot;Other (comment) Ortho Device/Splint Location: Right LE Ortho Device/Splint Interventions: Application   Asia R Thompson 04/18/2013, 1:57 PM

## 2013-04-18 NOTE — Progress Notes (Signed)
Physical Therapy Treatment Patient Details Name: Tim BarerJames B Tweten MRN: [id number] DOB: 01/23/1934 Today's Date: 04/18/2013    History of Present Illness 79 yo male with recent complications at [facility] after an ablation for afib in oct 2014 he developed a volvulus after the ablation was intubated/trachedm required multiple abd surgeries then developed fistula details unknown.  Has been on tpn, with ngt on/off since. Is at [facility] sent here for complaints of abd pain.  Pt was taken off tpn and feeding tube was removed last week.  He has been eating by mouth and doing well.  But last 4 days with worsening abd pain, no n/v/d.  No fevers (however is was started on vanc and zosyn for unknown reasons).  Pt na level is elevated, and appears dehydrated. 04/08/13 Pt now reports that he is having vertigo which has ebbed and flowed for the  past 14 years.    PT Comments    Making little progress presently due to dizziness (?vertigo) and malnitrition.  [facility] (for positioning and foot control sith standing and transfers.  Follow Up Recommendations  [facility]     Equipment Recommendations  Other (comment) (TBA)    Recommendations for Other Services       Precautions / Restrictions Precautions Precautions: Fall Restrictions Weight Bearing Restrictions: No    Mobility  Bed Mobility Overal bed mobility: Needs Assistance Bed Mobility: Supine to Sit   Sidelying to sit: Mod assist       General bed mobility comments: significant truncal assist  Transfers Overall transfer level: Needs assistance   Transfers: Sit to/from Stand;Squat Pivot Transfers Sit to Stand: From elevated surface;Mod assist;+2 physical assistance (times 2)   Squat pivot transfers: Max assist;+2 physical assistance     General transfer comment: significant lifting assist; support of R LE / R foot especially wants to turn/ supinate under weightbearing  Ambulation/Gait                 Stairs             Wheelchair Mobility    Modified Rankin (Stroke Patients Only)       Balance Overall balance assessment: Needs assistance Sitting-balance support: Feet supported;Bilateral upper extremity supported Sitting balance-[name] Scale: Poor Sitting balance - Comments: tendency to fall backward due to dynamic mattress needing min assist Postural control: Posterior lean Standing balance support: Bilateral upper extremity supported Standing balance-[name] Scale: Zero                      Cognition Arousal/Alertness: Awake/alert Behavior During Therapy: WFL for tasks assessed/performed Overall Cognitive Status: Within Functional Limits for tasks assessed                      Exercises      General Comments General comments (skin integrity, edema, etc.): Pt is profoundly weak due to not eating.  Consent being gotten for PEG placement.  Pt also has R drop foot as well as unstable ankle with tendency to supinate with weightbearing      Pertinent Vitals/Pain Sats maintained in low 90's on tc    Home Living                      Prior Function            PT Goals (current goals can now be found in the care plan section) Acute Rehab PT Goals Patient Stated Goal: To  get back home PT Goal Formulation: With patient Time For Goal Achievement: 04/25/13 Potential to Achieve Goals: Fair Progress towards PT goals: Not progressing toward goals - comment (due to decr nutrition and dizz.limiting tx; goals updated)    Frequency  Min 3X/week    PT Plan Current plan remains appropriate    Co-evaluation             End of Session Equipment Utilized During Treatment: Gait belt;Oxygen Activity Tolerance: Patient tolerated treatment well;Patient limited by fatigue Patient left: in chair;with call bell/phone within reach     Time: 1150-1215 PT Time Calculation (min): 25 min  Charges:  $Therapeutic Activity: 23-37 mins                    G Codes:       Cortne Amara, Eliseo Gum 04/18/2013, 12:41 PM 04/18/2013  Phelan Bing, PT [phone]  (pager)

## 2013-04-19 ENCOUNTER — Inpatient Hospital Stay (HOSPITAL_COMMUNITY): Payer: Medicare HMO

## 2013-04-19 LAB — PROTIME-INR
INR: 1.07 (ref 0.00–1.49)
PROTHROMBIN TIME: 13.7 s (ref 11.6–15.2)

## 2013-04-19 LAB — BASIC METABOLIC PANEL
BUN: 20 mg/dL (ref 6–23)
CO2: 25 meq/L (ref 19–32)
Calcium: 8.6 mg/dL (ref 8.4–10.5)
Chloride: 103 mEq/L (ref 96–112)
Creatinine, Ser: 0.65 mg/dL (ref 0.50–1.35)
GFR calc non Af Amer: >90 mL/min (ref >90)
Glucose, Bld: 166 mg/dL — ABNORMAL HIGH (ref 70–99)
Potassium: 4.4 mEq/L (ref 3.7–5.3)
SODIUM: 141 meq/L (ref 137–147)

## 2013-04-19 LAB — CBC
HCT: 24.6 % — ABNORMAL LOW (ref 39.0–52.0)
HEMOGLOBIN: 7.7 g/dL — AB (ref 13.0–17.0)
MCH: 29.7 pg (ref 26.0–34.0)
MCHC: 31.3 g/dL (ref 30.0–36.0)
MCV: 95 fL (ref 78.0–100.0)
PLATELETS: 117 10*3/uL — AB (ref 150–400)
RBC: 2.59 MIL/uL — ABNORMAL LOW (ref 4.22–5.81)
RDW: 19.3 % — ABNORMAL HIGH (ref 11.5–15.5)
WBC: 8.7 10*3/uL (ref 4.0–10.5)

## 2013-04-19 LAB — GLUCOSE, CAPILLARY
Glucose-Capillary: 145 mg/dL — ABNORMAL HIGH (ref 70–99)
Glucose-Capillary: 154 mg/dL — ABNORMAL HIGH (ref 70–99)
Glucose-Capillary: 166 mg/dL — ABNORMAL HIGH (ref 70–99)
Glucose-Capillary: 193 mg/dL — ABNORMAL HIGH (ref 70–99)
Glucose-Capillary: 166 mg/dL — ABNORMAL HIGH (ref 70–99)

## 2013-04-19 LAB — HEPARIN LEVEL (UNFRACTIONATED): HEPARIN UNFRACTIONATED: 0.39 [IU]/mL (ref 0.30–0.70)

## 2013-04-19 LAB — APTT: aPTT: 61 seconds — ABNORMAL HIGH (ref 24–37)

## 2013-04-19 MED ORDER — FUROSEMIDE 10 MG/ML IJ SOLN
40.0000 mg | Freq: Two times a day (BID) | INTRAMUSCULAR | Status: DC
  Administered 2013-04-20 – 2013-04-21 (×3): 40 mg via INTRAVENOUS
  Filled 2013-04-19 (×7): qty 4

## 2013-04-19 MED ORDER — FAT EMULSION 20 % IV EMUL
250.0000 mL | INTRAVENOUS | Status: AC
  Administered 2013-04-19: 250 mL via INTRAVENOUS
  Filled 2013-04-19: qty 250

## 2013-04-19 MED ORDER — ACETAMINOPHEN 160 MG/5ML PO SOLN
500.0000 mg | Freq: Four times a day (QID) | ORAL | Status: DC | PRN
  Administered 2013-04-19 – 2013-05-03 (×2): 500 mg via ORAL
  Filled 2013-04-19: qty 20
  Filled 2013-04-19: qty 20.3

## 2013-04-19 MED ORDER — FUROSEMIDE 10 MG/ML IJ SOLN: 40.0000 mg | Freq: Once | INTRAMUSCULAR | Status: DC

## 2013-04-19 MED ORDER — M.V.I. ADULT IV INJ
INTRAVENOUS | Status: AC
  Administered 2013-04-19: 18:00:00 via INTRAVENOUS
  Filled 2013-04-19: qty 2000

## 2013-04-19 MED ORDER — DILTIAZEM HCL 60 MG PO TABS
120.0000 mg | ORAL_TABLET | Freq: Three times a day (TID) | ORAL | Status: DC
  Administered 2013-04-19 – 2013-05-04 (×53): 120 mg via ORAL
  Filled 2013-04-19 (×67): qty 2

## 2013-04-19 NOTE — Progress Notes (Signed)
Patient ID: Tim BarerJames B Short, male   DOB: 11/03/1934, 79 y.o.   MRN: [id number]   Pt was scheduled for perc G tube today in IR Must be rescheduled secondary to barium in stomach still  Restart Hep and [facility] 4[facility]8

## 2013-04-19 NOTE — Progress Notes (Signed)
PARENTERAL NUTRITION CONSULT NOTE - FOLLOW UP  Pharmacy Consult for TPN Indication: inadequate PO intake, resolving SBO  Allergies  Allergen Reactions  . Corticosteroids     Inhaled Corticosteroids--Hoarseness, dry mouth  . Furosemide Other (See Comments)    Ototoxicity     Patient Measurements: Height: 5' 8.9" (175 cm) Weight: 204 lb (92.534 kg) IBW/kg (Calculated) : 70.47   Vital Signs: Temp: 98 F (36.7 C) (04/06 2222) Temp src: Oral (04/07 0600) BP: 123/86 mmHg (04/07 0600) Pulse Rate: 107 (04/07 0600) Intake/Output from previous day: 04/06 0701 - 04/07 0700 In: -  Out: 475 [Urine:475] Intake/Output from this shift:    Labs:  Recent Labs  04/16/13 1230 04/17/13 1100 04/17/13 1120 04/18/13 0622 04/19/13 0620  WBC  --   --  8.4 8.6 8.7  HGB  --   --  8.0* 8.1* 7.7*  HCT  --   --  26.3* 26.4* 24.6*  PLT  --   --  110* 121* 117*  APTT 30 72*  --  92* 61*  INR 1.13  --   --   --  1.07     Recent Labs  04/17/13 1120 04/18/13 0622 04/19/13 0620  NA 142 142 141  K 4.0 4.1 4.4  CL 104 104 103  CO2 30 26 25   GLUCOSE 182* 166* 166*  BUN 17 19 20   CREATININE 0.66 0.74 0.65  CALCIUM 8.3* 8.5 8.6  MG  --  2.1  --   PHOS  --  3.8  --   PROT  --  5.1*  --   ALBUMIN  --  2.2*  --   AST  --  33  --   ALT  --  49  --   ALKPHOS  --  74  --   BILITOT  --  <0.2*  --   PREALBUMIN  --  18.6  --   TRIG  --  114  --    Estimated Creatinine Clearance: 86.7 ml/min (by C-G formula based on Cr of 0.65).    Recent Labs  04/18/13 1631 04/18/13 2143 04/19/13 0605  GLUCAP 177* 145* 154*    Insulin Requirements in the past 24 hours:  2 units of Novolog + 15 units regular insulin in TPN bag  Current Nutrition:  -Dysphagia 2 diet with poor PO intake documented  -Ensure pudding po TID prn- each supplement provides 170kcal and 4g protein (Currently being refused by patient) -Clinimix E 5/15 to 60 ml/hr + 20% lipid emulsion at 10 ml/hr (provides 1502kCal ~72%  of goal and 72g protein ~65% of goal per day)   Nutritional Goals:  2050-2250 kCal, 110-120 grams of protein per day per RD 4/1  Admit: Transferred from [facility] with abdominal pain, partial SBO, ileus.  GI: Hx GERD, PUD - resolved ileus per surgery.  Fall 2014 - volvulus, multiple abd surgeries, fistula development, was on TPN long-term but recently stopped and was tolerating PO intake per report. Now with poor PO intake. Now on Dysphagia 2 diet w/ no N/V and good bowel movement. Please see RD note 3/27: recs for short-term enteral support via NGT is appropriate as he has a functioning GI tract. Prealbumin trended up to 18.6 on 4/6. Planning for PEG tube today  Endo: Hx DM (A1c 6.4) - CBGs mostly with SSI + insulin in TPN, continues on daily PO prednisone  Lytes: Na 141, K 4.4, phos and Mg WNL. Corr Ca 9.9, Currently on D5-1/2NS with 40mEq K @ 3350mL/hr  for hypernatremia and hypokalemia  Renal: SCr stable at 0.74  Pulm: Hx asthma, OSA - 95% 0.35 TC -  Daliresp  Cards: Hx afib, HTN, HLD, CAD - BP 113/76, HR 92 (NSR s/p cardioversion 3/27 but now in afib again) - amio, lipitor, dilt, heparin (transitioned from Xarelto for PEG tube placement)  Hepatobil: LFTs now trended down to nl, Tbili WNL, trigs WNL- HIT panel neg  Neuro: Hx RLS, back pain, migraines - A&O  ID: Completed a 7d course of abx for HCAP 3/26 - Afebrile, WBC WNL - flagyl D#1 for Cdiff  Best Practices: Heparin infusion, MC, PPI PO  TPN Access: PICC placed 3/22  TPN day#: 13  Plan:  1. Continue Clinimix E 5/15 to 60 ml/hr + 20% lipid emulsion at 10 ml/hr (provides 1502kCal (~72% of goal) and 72g protein (65% of goal) per day) - will not advance back up to goal at this time since planning on PEG tube today 2. Continue SSI + 15 units insulin in TPN  3. Daily MVI; trace elements M/W/F only due to national shortage  4. F/u AM labs and K (may need to lower K in MIVF if K continues to trend up) 5. F/u PEG placement and ability  to start enteral diet  Vinnie Level, PharmD.  Clinical Pharmacist Pager [phone]

## 2013-04-19 NOTE — Progress Notes (Signed)
OT Cancellation Note  Patient Details Name: Tim Short MRN: [id number] DOB: 09/08/1934   Cancelled Treatment:    Reason Eval/Treat Not Completed: Medical issues which prohibited therapy. Asked pt if he would like to work on some light grooming tasks (nursing states ok for activity to tolerance) but pt shaking his head no. Will try back another time.  Lennox LaityStone, Paula Busenbark [place] [phone] 04/19/2013, 11:13 AM

## 2013-04-19 NOTE — Progress Notes (Signed)
Found pts trach cuff deflated-pt has mod amount of secretions. RT inflated cuff

## 2013-04-19 NOTE — Progress Notes (Addendum)
PROGRESS NOTE  YENG PERZ RUE:[id number] DOB: 06/20/1934 DOA: 03/28/2013 PCP:  Duane Lope, MD Interim History Pt is 79 yo male with fairly recent hospitalization at [facility] in September 2014 for failed maize procedure and during that hospitalization pt developed volvulus and has required right colectomy (10/08/2012), subsequently developed multiple abscesses and enteric fistulas managed percutaneous drains. Pt was in prolonged respiratory failure at [facility], unable to wean of the vent and requiring trach placement 10/29/2012. He was transferred to [facility] in December 2014 and later to [facility] in late January 2015. He was on TNA and was doing fairly well initially, but developed more abdominal pain and on 2/23 taken to OR in [place] for gallbladder removal (secondary to cholecystitis), resection for part of the colon for fistula, IVC filter placement. Sent back to [facility] on 03/16/13 and started on TPN. Diet advanced on March 9th, 2015 after pt passed swallow evaluation. He was brought to [facility] March 16th, after noticing progressive abdominal distension. In ED, CT abdomen with air- fluid level and findings consistent with early partial SBO, ileus.  Patient admitted to [facility] service 03/29/2013 with surgical consult, his SBO slowly resolved and surgery signed off 3/27. Throughout his hospitalization patient developed Atrial fibrillation with RVR to rated of 130s-140s, stable, and cardiology was consulted. Patient had several up titration of his medications including addition of Amiodarone, and was d/c cardioverted on 3/27. He maintained sinus rhythm through 3/28-3/29 with plans for discharge on 3/31, however on 3/30 went back into A fib with RVR and cardiology has been re consulted.   Electrophysiology was consulted and the patient's Cardizem CD was increased to 240 mg twice a day. His heart rate improved from 130s to 120s. Metoprolol tartrate was started with improvement of the heart rate to 110s. Cardiology has  signed off but may need to be reconsulted. The patient was initially started on TPN with hopes that this would be temporary. However there is no significant improvement in the patient's nutritional or clinical status. As a result, IR was consulted for gastrostomy tube placement. It is hopeful that this will be placed on 04/20/2013. He was initially scheduled for 04/19/2013, but contrast transit beyond the stomach was slow.  The patient's insurance has denied LTAC transfer.  Case management is working on appealing this as well as the patient's son. In the last 24 hours, the patient has an increasing frothy tracheal secretions. Chest x-ray revealed increasing vascular congestion. The patient was started on intravenous Lasix.   Assessment/Plan: Atrial fibrillation/flutter  -appreciate cardiology/EP following  -s/p cardioversion 3/27, restored sinus rhythm and maintained over the weekend -04/11/13 am--back in A fib with RVR, rates in the 130s.  -Cardiology reconsulted, appreciate input.  -continue amiodarone 200mg  bid per EP  -Defer chronotropic control to cardiology--diltiazem CD increased to 240bid  -HR improving 100-110 with addition of metoprolol tartrate  -Continue rivaroxaban  -add metoprolol tartrate 12.5 mg by mouth daily with HR 100-110 -Discontinued rivaroxaban and started the patient on IV heparin in preparation for gastrostomy tube placement--restart Xarelto after PEG tube -Consult IR for gastrostomy tube placement--tentatively planned for 04/19/2013  Clostridium difficile colitis  -Flagyl started 04/16/2013  -Partly contributing to abdominal pain  -fewer BMs, but still loose stools  Abdominal pain  - secondary to ileus, partial SBO as observed on admission as well as Clostridium difficile colitis presently  -ileus/SBO resolved, patient with bowel movements  -has a degree of abdominal pain which is stable.  -  continue supportive care with analgesia, antiemetics as needed for symptom  control.  - nutrition consulted, appreciate input,  -patient is not meeting his nutritional needs based on oral intake.  - patient with intermittent confusion last week, which is now better, and on anticoagulation for his A fib  -Consult IR for gastrostomy tube placement--tentatively planned on 04/19/13  -TPN initiated 3/26 and that can be weaned off if po intake improves and pt improves clinically  -long discussion with son regarding risks and benefits of enteral feeding vs TPN--he is amenable to gastrostomy tube placement  -continue oral intake as tolerated, had another swallow evaluation 3/30, his diet was changed from Dys1 to Dys 2,  Moderate malnutrition  - secondary to acute on chronic illness as outlined above  - pt tolerating well however does not seem to meet his nutritional requirements per nutrition  - on TPN, appreciate nutrition and pharmacy consults.  -Ultimately plan to switch the patient enteral feeding  Thrombocytopenia  -Suspect myelosuppression due to acute medical illness--stable, slowly improving  -Check serum B12--558  -RBC folate--950  -Peripheral smear--no schistocytes  -Fibrinogen--355  -HIT panel--results pending  -INR 1.14  Leukocytosis  - likely secondary to ileus, HCAP as suggestive per CXR 3/20  - CT abd/pelvis with mild leak around anastomosis but no other acute events, and surgery signed off  - leukocytosis has now resolved  Dysuria  -UA neg for pyuria  HCAP -  -Finished one week cefepime/levoflox and vancomycin 04/07/2013  -Remains afebrile without any respiratory distress  Acute on Chronic respiratory failure  -Continue trach collar 40% oxygenation  -Had to increase oxygenation past 24hr -due to CHF--EF 55-60% on 12/31/13 Acute on chronic diastolic CHF - monitoring I's/O's, daily weights  - weight trend: 191 >>204 -start Lasix 40mg  IV bid - repeat CXR 4/7-->increase vascular congestion Hypokalemia - continue supplementation. Magnesium normal.    Sacral ulcer - skin breakdown noted, closely monitor. Nursing care. No evidence of infection.  Anemia of chronic disease - no signs of active bleeding. Likely multifactorial ?medication induced, nutritional deficiencies. Closely monitor.  Essential hypertension -  -controlled  Tracheostomy status  -pt requires frequent suctioning--will benefit going to LTAC  -Has had numerous episodes of mucous plugging requiring aggressive suctioning  -concerned about ability to provide level of care he needs at [facility]  - Patient with a desaturation event 3/30 evening requiring suctioning.  -pulmonary toilet  Hypernatremia - TPN per pharmacy. Improving, supplemented 3/30 with D5W.  Family Communication: updated son on phone total time 35 min  Disposition Plan: [name] vs [facility]          Procedures/Studies: Ct Abdomen Pelvis W Contrast  04/04/2013   CLINICAL DATA:  Liver flight non.  Leukocytosis.  EXAM: CT ABDOMEN AND PELVIS WITH CONTRAST  TECHNIQUE: Multidetector CT imaging of the abdomen and pelvis was performed using the standard protocol following bolus administration of intravenous contrast.  CONTRAST:  90mL OMNIPAQUE IOHEXOL 300 MG/ML  SOLN  COMPARISON:  DG ABD PORTABLE 1V dated 04/04/2013; CT ABD/PELVIS W CM dated 03/28/2013; CT ABD-PELV W/O CM dated 03/11/2013; CT ABD/PELVIS W CM dated 01/30/2013  FINDINGS: Stable moderate left and small right pleural effusions with associated passive atelectasis. Mild cardiomegaly. Postoperative findings along the gastroesophageal junction.  Slightly reduce marginal definition of the infiltrative hypodense process involving the right hepatic lobe and portions of segment 4 of the liver.  The spleen, adrenal glands, and pancreas unremarkable. Small amount of fluid along the gallbladder fossa and along the inferior edge  of the right hepatic lobe could, similar to prior. Gallbladder surgically absent.  There is contrast medium in the renal collecting systems on the  initial portal venous phase images, likely due to inadvertent early injection.  Stable renal hypodensities. Infrarenal IVC filter. Continued fluid along the inferior margin of the laparotomy wound. Trace fluid in the lower omentum. Stable trace presacral edema.  Bilateral chronic pars defects at L5 observed with 9 mm of anterolisthesis.  Orally administered contrast extends through to the rectum. Mild circumferential rectal wall thickening.  The patient seems to have had right hemicolectomy, with reanastomosis to the small bowel on image 39 of series 5. Outpouching from the proximal terminal margin of the remaining colon shown on images 32 through 21 of series 5, potentially some type of postoperative diverticulum or contained leak along the stable site.  IMPRESSION: 1. Similar distribution but reduced conspicuity of the peripheral infiltrative hepatic hypodensity affecting the right hepatic lobe an adjacent portion of segment 4. Fatty infiltration is the most common cause for and infiltrative hypodensity of this type, although localized parenchymal inflammation is not entirely excluded. MRI could differentiate if clinically warranted. 2. Small amount of perihepatic ascites, similar to prior. 3. Hypodense renal lesions, similar to prior. 4. Patient appears to have had right hemicolectomy with small bowel reanastomosis. There is a collection of gas and contrast extending to the right of the proximal terminal margin of the remaining colon, potentially a small contained leak along the staple line. 5. Continued fluid along the inferior margin of the laparotomy wound, but without internal gas density currently.   Electronically Signed   By: Herbie Baltimore M.D.   On: 04/04/2013 18:40   Ct Abdomen Pelvis W Contrast  03/28/2013   CLINICAL DATA:  Abdominal distention.  EXAM: CT ABDOMEN AND PELVIS WITH CONTRAST  TECHNIQUE: Multidetector CT imaging of the abdomen and pelvis was performed using the standard protocol  following bolus administration of intravenous contrast.  CONTRAST:  OMNIPAQUE IOHEXOL 300 MG/ML  SOLN  COMPARISON:  DG ABD ACUTE W/CHEST dated 03/28/2013; CT ABD-PELV W/O CM dated 03/11/2013; CT ABD/PELVIS W CM dated 01/30/2013  FINDINGS: There is a new lucency is again noted in the liver. This remains unchanged in appearance and may be infectious in etiology. No clearcut fluid collection in or air collection is noted in the liver to suggest a definite abscess. These changes may be related to phlegmon. Close follow up to evaluate for developing abscess suggested. Hepatic veins patent. Splenic and portal veins patent. No focal significant splenic abnormality. Pancreas normal. Cholecystectomy. No biliary distention.  Adrenals normal. Stable approximate 13 mm low-density lesion in the left kidney, not definite simple cyst. As noted on prior study, MRI of the kidneys suggest for further evaluation. No hydronephrosis. The bladder is nondistended. No free pelvic fluid. Prostate is not enlarged. Calcifications within the prostate. No significant adenopathy. Abdominal aorta is atherosclerotic. No aneurysm. Visceral vessels are patent. Inferior vena caval filter noted in the IVC with tip just below the renal veins.  Previously identified drainage catheter in the right upper quadrant has been removed. Inflammatory changes are noted in the right mid abdomen, no abscess noted. The patient has had a prior hemicolectomy. Reference is made to CT report of 01/31/2012 which discusses fistula changes within the abdomen. Patient status post right hemicolectomy. The colon is moderately distended and contains fluid levels. These changes have improved from prior exam suggesting improving ileus. These changes may be related to a diarrheal illness. Follow-up abdominal  series suggested. No small bowel distention. The stomach is nondistended. No free air noted.  Atelectasis and/or infiltrates in the lung bases. Bilateral pleural  effusions. Cardiomegaly. Coronary artery disease. Midline abdominal scar noted. Fluid noted in the region of the scar with associated small amount of air. Developing abscess should be considered. These findings have progressed slightly from prior study. Multiple anterior abdominal wall subcutaneous nodules most likely injection granulomas.  IMPRESSION: 1. Findings consistent with persistent phlegmon in the liver. 2. Interim removal of right upper quadrant drainage catheter. 3. Colonic distention with air-fluid levels. These findings have improved from prior study suggesting improving adynamic ileus or diarrheal illness. Followup abdominal series suggested. 4. Slight progression of soft tissue fluid in the anterior abdomen in the region the patient's scarring. This could represent developing phlegmon/abscess. Small amount of air is noted within this fluid collection. 5. 13 mm low-density lesion left kidney, not definite simple cyst. As noted on prior study nonenhanced and enhanced MRI of the kidneys suggested for further evaluation.   Electronically Signed   By: Maisie Fus  Register   On: 03/28/2013 23:29   Dg Chest Port 1 View  04/19/2013   CLINICAL DATA:  Chronic respiratory failure and worsening shortness of Breath  EXAM: PORTABLE CHEST - 1 VIEW  COMPARISON:  04/10/2013  FINDINGS: A left-sided PICC line, tracheostomy tube and postsurgical changes are again seen and stable. The cardiac shadow is stable. Left-sided pleural effusion is again identified as well as left retrocardiac atelectasis. No focal chondral no other focal infiltrate is seen.  IMPRESSION: Stable changes in the left base.  No new focal abnormality is noted.   Electronically Signed   By: Alcide Clever M.D.   On: 04/19/2013 12:27   Dg Chest Port 1 View  04/10/2013   CLINICAL DATA:  Cough and congestion.  EXAM: PORTABLE CHEST - 1 VIEW  COMPARISON:  DG CHEST 1V PORT dated 04/03/2013  FINDINGS: Tracheostomy and left-sided PICC line positioning are  stable. The right jugular central line has been removed since the prior chest x-ray. Lungs show lower volumes bilaterally with increased prominence of bilateral lower lobe atelectasis. There may be a component of left-sided pleural fluid. The heart remains moderately enlarged with evidence of prior placement of a left atrial appendage clip. No overt pulmonary edema is identified.  IMPRESSION: Lower volumes with increased prominence of bilateral lower lobe atelectasis. There may be a component of left-sided pleural fluid.   Electronically Signed   By: Irish Lack M.D.   On: 04/10/2013 11:29   Dg Chest Port 1 View  04/03/2013   CLINICAL DATA:  Central venous catheter placement  EXAM: PORTABLE CHEST - 1 VIEW  COMPARISON:  DG CHEST 1V PORT dated 04/01/2013  FINDINGS: Left upper extremity PICC is been placed. Tip is in the lower SVC. Tracheostomy tube and left atrial appendage clip stable. Left pleural effusion and basilar consolidation stable. Vascular congestion increased.  IMPRESSION: Left PICC placed with its tip at the lower SVC.  Increased vascular congestion.  Stable left basilar consolidation and left pleural effusion.   Electronically Signed   By: Maryclare Bean M.D.   On: 04/03/2013 09:04   Dg Chest Port 1 View  04/01/2013   CLINICAL DATA:  Leukocytosis  EXAM: PORTABLE CHEST - 1 VIEW  COMPARISON:  03/28/2013  FINDINGS: Postsurgical changes are again seen. A right-sided jugular central line is noted in the right innominate vein stable from the prior exam. The cardiac shadow is stable. Left basilar consolidation with  increasing effusion is noted.  IMPRESSION: Increasing left basilar infiltrate and effusion.   Electronically Signed   By: Alcide Clever M.D.   On: 04/01/2013 09:10   Dg Abd 2 Views  03/30/2013   CLINICAL DATA:  Colonic distention.  Multiple abdominal surgeries.  EXAM: ABDOMEN - 2 VIEW  COMPARISON:  DG ABD 2 VIEWS dated 03/29/2013; CT ABD/PELVIS W CM dated 03/28/2013  FINDINGS: Trace blunting of  the right costophrenic angle. No free intraperitoneal gas.  Dilated loops of left upper quadrant small bowel measure up to 4 cm, similar to the prior exam. There are several air-fluid levels within these small bowel loops. Reduced colonic gas compared to the prior exam. IVC filter noted.  IMPRESSION: 1. Mildly dilated loops of left upper quadrant small bowel with scattered internal air-fluid levels, similar to the prior exam, with abnormal but nonspecific pattern which could be due to could ileus or partial small bowel obstruction. Correlate with bowel sounds and bowel function.   Electronically Signed   By: Herbie Baltimore M.D.   On: 03/30/2013 10:43   Dg Abd 2 Views  03/29/2013   CLINICAL DATA:  Lower abdominal pain. Nausea. Shortness of breath. Weakness.  EXAM: ABDOMEN - 2 VIEW  COMPARISON:  Abdominal radiograph 03/28/2013.  FINDINGS: Some colonic gas is noted. However, there are multiple borderline dilated of mildly dilated loops of gas-filled small bowel measuring up to 4.2 cm in diameter throughout the central abdomen and left side of the abdomen. No pneumoperitoneum appreciated on the left lateral decubitus view. Several small air-fluid levels are noted. Residual iodinated contrast material is noted within the lumen of the urinary bladder related to yesterday's CT examination. Numerous surgical clips are noted in the right upper quadrant of the abdomen. IVC filter projecting over either the right side at L2-L3.  IMPRESSION: 1. Nonspecific bowel gas pattern, as above, suggestive of partial small bowel obstruction. 2. No pneumoperitoneum.   Electronically Signed   By: Trudie Reed M.D.   On: 03/29/2013 13:38   Dg Abd Acute W/chest  03/28/2013   CLINICAL DATA:  Evaluate for a bowel obstruction.  EXAM: ACUTE ABDOMEN SERIES (ABDOMEN 2 VIEW & CHEST 1 VIEW)  COMPARISON:  None.  FINDINGS: Tracheostomy tube tip is above the carina. Heart size is mildly enlarged. The lung volumes are low. There is asymmetric  elevation of the left hemidiaphragm. Left pleural effusion is noted.  Patient has an IVC filter. There are persistent abnormally dilated loops of small bowel which measure up to 5.5 cm. Gas is noted within the colon and rectum. There is no evidence of dilated bowel loops or free intraperitoneal air. No radiopaque calculi or other significant radiographic abnormality is seen. Heart size and mediastinal contours are within normal limits. Both lungs are clear.  IMPRESSION: 1. Persistent small bowel dilatation compatible with either partial small bowel obstruction or ileus. 2. No change in aeration to the lungs compared with previous exam   Electronically Signed   By: Signa Kell M.D.   On: 03/28/2013 18:56   Dg Abd Portable 1v  04/19/2013   CLINICAL DATA:  Evaluate barium in bowel  EXAM: PORTABLE ABDOMEN - 1 VIEW  COMPARISON:  04/07/2013  FINDINGS: Oral contrast is seen in the gastric fundus. The remainder of the stomach does not show significant contrast. There is no significant barium in the larg or small bowel. Negative for bowel obstruction.  IVC filter unchanged at the L2-3 level.  IMPRESSION: Barium is localized to the gastric fundus.  Negative for bowel obstruction.   Electronically Signed   By: Marlan Palauharles  Clark M.D.   On: 04/19/2013 08:05   Dg Abd Portable 1v  04/07/2013   CLINICAL DATA:  Abdominal pain.  EXAM: PORTABLE ABDOMEN - 1 VIEW  COMPARISON:  None.  FINDINGS: The bowel gas pattern is normal. IVC filter seen at the level of L3. Surgical clips from prior cholecystectomy noted. No radio-opaque calculi or other significant radiographic abnormality are seen.  IMPRESSION: No acute findings.  Unremarkable bowel gas pattern.   Electronically Signed   By: Myles RosenthalJohn  Stahl M.D.   On: 04/07/2013 15:26   Dg Abd Portable 1v  04/04/2013   CLINICAL DATA:  Evaluate barium  EXAM: PORTABLE ABDOMEN - 1 VIEW  COMPARISON:  03/30/2013  FINDINGS: Contrast material is noted within the transverse colon and splenic flexure of  the colon. Small bowel loop distention has improved. Slight prominence of left abdominal small bowel loops persists. No free air. No organomegaly. Prior cholecystectomy.  IMPRESSION: Improving small bowel distention. Small amount of oral contrast material within the transverse colon and splenic flexure of the colon.   Electronically Signed   By: Charlett NoseKevin  Dover M.D.   On: 04/04/2013 10:18   Dg Swallowing Func-speech Pathology  04/11/2013   Riley NearingBonnie Caroline Deblois, CCC-SLP     04/11/2013  3:10 PM Objective Swallowing Evaluation: Modified Barium Swallowing Study   Patient Details  Name: Rush BarerJames B Galbreath MRN: [id number] Date of Birth: 02/12/1934  Today's Date: 04/11/2013 Time: [id number] SLP Time Calculation (min): 25 min  Past Medical History:  Past Medical History  Diagnosis Date  . Hypertension   . Hypercholesteremia   . Asthma   . Meniere disease   . Persistent atrial fibrillation     a. s/p multiple dccv's;  b. failed amio;  c. not felt to be AF  RFCA canddiate due to LA dil;  d. s/p failed hybrid ablation @  [facility] in 09/2012;  e. chronic pradaxa.  . Obesity   . Biatrial enlargement     LA size 5.3cm  . Obstructive sleep apnea     AHI 108/hr now on CPAP at 12cm H2O  . H/O hiatal hernia   . GERD (gastroesophageal reflux disease)     "associated w/hiatal hernia" (06/04/2012)  . Peptic ulcer 1950's  . Migraines     "haven't had one for about 15 years" (06/04/2012)  . Arthritis     "joints" (06/04/2012)  . Chronic lower back pain   . Nephrolithiasis ~ 1960's    "passed on their own" (06/04/2012)  . History of pneumonia 2002; 2006    "spent 8 days in isolation; Norovirus" (06/04/2012)  . Other and unspecified angina pectoris   . RLS (restless legs syndrome)   . Coronary artery disease     a. 05/2012 Cath/PCI: LM nl, LAD nl, LCX 2638m (3.0x15 Integrity  BMS), PTCA of OM1 through stent struts (kissing balloon).  . Diabetes mellitus without complication     FAMILY STATES PATIENT IS NOT DIABETIC   Past Surgical History:  Past Surgical  History  Procedure Laterality Date  . Wrist fracture surgery  1992    "repaired w/left hip bone graft" (06/04/2012)  . Total knee arthroplasty  08/19/1999  . Colonoscopy w/ polypectomy  2006  . Knee arthroscopy with meniscal repair Right 1974    "medial meniscus repaired" (06/04/2012)  . Cardioversion  10/02/2011    Procedure: CARDIOVERSION;  Surgeon: Corky CraftsJayadeep S. Varanasi, MD;   [facility];  Service: Cardiovascular;  Laterality:  N/A;  h/p in file drawer  . Cardioversion  11/07/2011    Procedure: CARDIOVERSION;  Surgeon: Corky Crafts, MD;   Location: [facility] ENDOSCOPY;  Service: Cardiovascular;  Laterality:  N/A;  h/p from 10/22 in file drawer/dl  . Cardiac catheterization  05/26/2012  . Coronary angioplasty with stent placement  06/04/2012    "1" (06/04/2012)  . Cataract extraction w/ intraocular lens  implant, bilateral   2009  . Hemorrhoid surgery  ~ 2003  . Hiatal hernia repair  1983  . Nissen fundoplication  1983  . Ablation of dysrhythmic focus  SEPT/OCT 2014    ATRIAL FIB  . Inguinal hernia repair Bilateral 2000 and 2005    "one done at a time" (06/04/2012)  . Colon surgery  10/08/2012   . Tracheostomy  OCT 2014   HPI:  Pt is 79 yo male with fairly recent hospitalization at [facility] in  September 2014 for failed maize procedure and during that  hospitalization pt developed volvulus and has required right  colectomy (10/08/2012), subsequently 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 Pt was in  prolonged respiratory failure at [facility], unable to wean of the vent  and requiring trach placement 10/29/2012. He was transferred to  [facility] in December 2014 and later to [facility] in late January  2015. He was on TNA and was doing fairly well initially, but  developed more abdominal pain and on 2/23 taken to OR in [place]  for gallbladder removal (secondary to cholecystitis), resection  for part of the colon for fistula, IVC filter placement. Sent  back to [facility] on march 4th, 2015 and  started on TPN. Diet  advanced on March 9th, 2015 after pt passed swallow evaluation.  He was brought to [facility] March 16th, after noticing progressive  abdominal distension. In ED, CT abdomen with air- fluid level and  findings consistent with early partial SBO, ileus. Surgery  reports pt is ok for PO from their standpoint, SLP ordered for  swallow eval.      Assessment / Plan / Recommendation Clinical Impression  Dysphagia Diagnosis: Moderate pharyngeal phase dysphagia Clinical impression: Pt demonstrates significant improvement in  motor function since last MBS. Pts primary decifits are now  exclusively sensory in nature. Pt has a delay in swallow  initiation with nectar and thin teaspoon amounts with aspiration  before the swallow. With a chin tuck, the pt is albe to protect  the airway before the swallow with nectar vea teaspoon or straw.  Pt is also able to masticate soft and regular textured solids. He  was noted to have expectoration of mucous/barium after the test,  possible esophageal component not visualized due to body habitus.  Pt is recommended to upgrade to a dys 2 Finely chopped diet with  nectar thick liquids via spoon or straws with full supervison for  a chin tuck and placement of PMSV. Pt may upgrade solids if he  tolerates dys 2 diet.     Treatment Recommendation  Therapy as outlined in treatment plan below    Diet Recommendation Dysphagia 2 (Fine chop);Nectar-thick liquid   Liquid Administration via: Spoon;Straw Medication Administration: Crushed with puree Supervision: Patient able to self feed;Full supervision/cueing  for compensatory strategies Compensations: Slow rate;Small sips/bites Postural Changes and/or Swallow Maneuvers: Chin tuck;Seated  upright 90 degrees    Other  Recommendations Oral Care Recommendations: Oral care BID Other Recommendations: Place PMSV during PO intake;Order  thickener from pharmacy;Have oral suction available   Follow Up  Recommendations  Skilled Nursing facility     Frequency and Duration min 2x/week  2 weeks   Pertinent Vitals/Pain NA    SLP Swallow Goals     General HPI: Pt is 79 yo male with fairly recent hospitalization  at [facility] in September 2014 for failed maize procedure and during  that hospitalization pt developed volvulus and has required right  colectomy (10/08/2012), subsequently 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 Pt was in  prolonged respiratory failure at [facility], unable to wean of the vent  and requiring trach placement 10/29/2012. He was transferred to  [facility] in December 2014 and later to [facility] in late January  2015. He was on TNA and was doing fairly well initially, but  developed more abdominal pain and on 2/23 taken to OR in [place]  for gallbladder removal (secondary to cholecystitis), resection  for part of the colon for fistula, IVC filter placement. Sent  back to [facility] on march 4th, 2015 and started on TPN. Diet  advanced on March 9th, 2015 after pt passed swallow evaluation.  He was brought to [facility] March 16th, after noticing progressive  abdominal distension. In ED, CT abdomen with air- fluid level and  findings consistent with early partial SBO, ileus. Surgery  reports pt is ok for PO from their standpoint, SLP ordered for  swallow eval.  Type of Study: Modified Barium Swallowing Study Reason for Referral: Objectively evaluate swallowing function Previous Swallow Assessment: [facility] hospital - 3/16 pt "passed"  FEES and was started on diet, began to decline and blue dye test  was given, pt silently aspirated thin, nectar, honey per report.  Diet Prior to this Study: Dysphagia 1 (puree);Pudding-thick  liquids Temperature Spikes Noted: No Respiratory Status: Trach collar Trach Size and Type: Cuff;Deflated;With PMSV in place;#6;Other  (Comment) History of Recent Intubation: No Behavior/Cognition: Alert;Cooperative;Requires cueing;Hard of  hearing Oral Cavity - Dentition: Missing dentition Oral Motor / Sensory  Function: Impaired - see Bedside swallow  eval Self-Feeding Abilities: Able to feed self;Needs assist Patient Positioning: Upright in chair Baseline Vocal Quality: Low vocal intensity Volitional Cough: Strong Volitional Swallow: Able to elicit Anatomy: Within functional limits Pharyngeal Secretions: Not observed secondary MBS    Reason for Referral Objectively evaluate swallowing function   Oral Phase Oral Preparation/Oral Phase Oral Phase: Impaired Oral Phase - Comment Oral Phase - Comment: missing dentition, but places boluse  effectively between remaining teeth. otherwise WFL.    Pharyngeal Phase Pharyngeal Phase Pharyngeal Phase: Impaired Pharyngeal - Honey Pharyngeal - Honey Teaspoon: Not tested Pharyngeal - Honey Cup: Not tested Pharyngeal - Nectar Pharyngeal - Nectar Teaspoon: Delayed swallow  initiation;Penetration/Aspiration before swallow (chin tuck  increases airway closure, preents penetration) Penetration/Aspiration details (nectar teaspoon): Material enters  airway, passes BELOW cords without attempt by patient to eject  out (silent aspiration);Material does not enter airway Pharyngeal - Nectar Straw: Delayed swallow initiation (chin tuck) Pharyngeal - Thin Pharyngeal - Thin Teaspoon: Delayed swallow  initiation;Penetration/Aspiration before swallow (chin tuck) Penetration/Aspiration details (thin teaspoon): Material enters  airway, CONTACTS cords and not ejected out Pharyngeal - Solids Pharyngeal - Puree: Delayed swallow initiation (chin tuck) Pharyngeal - Mechanical Soft: Delayed swallow initiation Pharyngeal - Regular: Delayed swallow initiation  Cervical Esophageal Phase    GO             Harlon Ditty, MA CCC-SLP [phone]  Claudine Mouton 04/11/2013, 3:08 PM    Dg Swallowing Func-speech Pathology  04/02/2013   Breck Coons Litaker, CCC-SLP  04/02/2013  2:03 PM Objective Swallowing Evaluation: Modified Barium Swallowing Study   Patient Details  Name: SOUA CALTAGIRONE MRN: [id number] Date  of Birth: 1934-11-11  Today's Date: 04/02/2013 Time: 1225-1250 SLP Time Calculation (min): 25 min  Past Medical History:  Past Medical History  Diagnosis Date  . Hypertension   . Hypercholesteremia   . Asthma   . Meniere disease   . Persistent atrial fibrillation     a. s/p multiple dccv's;  b. failed amio;  c. not felt to be AF  RFCA canddiate due to LA dil;  d. s/p failed hybrid ablation @  [facility] in 09/2012;  e. chronic pradaxa.  . Obesity   . Biatrial enlargement     LA size 5.3cm  . Obstructive sleep apnea     AHI 108/hr now on CPAP at 12cm H2O  . H/O hiatal hernia   . GERD (gastroesophageal reflux disease)     "associated w/hiatal hernia" (06-15-2012)  . Peptic ulcer 1950's  . Migraines     "haven't had one for about 15 years" (15-Jun-2012)  . Arthritis     "joints" (06/15/12)  . Chronic lower back pain   . Nephrolithiasis ~ 1960's    "passed on their own" (2012/06/15)  . History of pneumonia 2002; 2006    "spent 8 days in isolation; Norovirus" (06-15-12)  . Other and unspecified angina pectoris   . RLS (restless legs syndrome)   . Coronary artery disease     a. 05/2012 Cath/PCI: LM nl, LAD nl, LCX 4m (3.0x15 Integrity  BMS), PTCA of OM1 through stent struts (kissing balloon).  . Diabetes mellitus without complication     FAMILY STATES PATIENT IS NOT DIABETIC   Past Surgical History:  Past Surgical History  Procedure Laterality Date  . Wrist fracture surgery  1992    "repaired w/left hip bone graft" (06-15-12)  . Total knee arthroplasty  08/19/1999  . Colonoscopy w/ polypectomy  2006  . Knee arthroscopy with meniscal repair Right 1974    "medial meniscus repaired" (Jun 15, 2012)  . Cardioversion  10/02/2011    Procedure: CARDIOVERSION;  Surgeon: Corky Crafts, MD;   Location: [facility] ENDOSCOPY;  Service: Cardiovascular;  Laterality:  N/A;  h/p in file drawer  . Cardioversion  11/07/2011    Procedure: CARDIOVERSION;  Surgeon: Corky Crafts, MD;   Location: [facility] ENDOSCOPY;  Service: Cardiovascular;  Laterality:  N/A;   h/p from 10/22 in file drawer/dl  . Cardiac catheterization  05/26/2012  . Coronary angioplasty with stent placement  06-15-2012    "1" (Jun 15, 2012)  . Cataract extraction w/ intraocular lens  implant, bilateral   2009  . Hemorrhoid surgery  ~ 2003  . Hiatal hernia repair  1983  . Nissen fundoplication  1983  . Ablation of dysrhythmic focus  SEPT/OCT 2014    ATRIAL FIB  . Inguinal hernia repair Bilateral 2000 and 2005    "one done at a time" (06/15/2012)  . Colon surgery  10/08/2012   . Tracheostomy  OCT 2014   HPI:  Pt is 79 yo male with fairly recent hospitalization at [facility] in  September 2014 for failed maize procedure and during that  hospitalization pt developed volvulus and has required right  colectomy (10/08/2012), subsequently 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 Pt was in  prolonged respiratory failure at [facility], unable to wean of the vent  and requiring trach placement 10/29/2012. He was transferred to  [facility] in December 2014 and later to  [facility] in late January  2015. He was on TNA and was doing fairly well initially, but  developed more abdominal pain and on 2/23 taken to OR in [place]  for gallbladder removal (secondary to cholecystitis), resection  for part of the colon for fistula, IVC filter placement. Sent  back to [facility] on march 4th, 2015 and started on TPN. Diet  advanced on March 9th, 2015 after pt passed swallow evaluation.  He was brought to [facility] March 16th, after noticing progressive  abdominal distension. In ED, CT abdomen with air- fluid level and  findings consistent with early partial SBO, ileus. Surgery  reports pt is ok for PO from their standpoint, SLP ordered for  swallow eval.      Assessment / Plan / Recommendation Clinical Impression  Dysphagia Diagnosis: Moderate pharyngeal phase dysphagia;Severe  pharyngeal phase dysphagia Clinical impression: MBS completed with pt's PMSV donned and  appears to be congruent with study performed at [facility].  He   demonstrated moderate-severe motor based pharyngeal dyspahgia as  evidenced by reduced tongue base retraction, decreased laryngeal  elevation and decreased pharyngeal contraction.  Impairments led  to mild pharyngeal residue in vallecule/pyriform sinuses and  posterior pharyngeal wall and laryngeal vestibule consistently  silent penetration to vocal cords.  Verbal cues for hard cough  briefly cleared vestibule with subsequent penetration from  residue.  SLP recommends Dys 1 (puree) only, no liquids (pudding  thick liquid) with speaking valve donned, two swallows,  volitional coughs, pills crushed in applesauce and full  supervision/assist.       Treatment Recommendation  Therapy as outlined in treatment plan below    Diet Recommendation Dysphagia 1 (Puree);Pudding-thick liquid   Medication Administration: Crushed with puree Supervision: Patient able to self feed;Full supervision/cueing  for compensatory strategies Compensations: Slow rate;Small sips/bites;Multiple dry swallows  after each bite/sip;Hard cough after swallow Postural Changes and/or Swallow Maneuvers: Seated upright 90  degrees;Upright 30-60 min after meal, wear speaking valve during  all po's    Other  Recommendations Oral Care Recommendations: Oral care BID Other Recommendations: Order thickener from pharmacy   Follow Up Recommendations  Skilled Nursing facility    Frequency and Duration min 2x/week  2 weeks   Pertinent Vitals/Pain WDL            Reason for Referral Objectively evaluate swallowing function   Oral Phase Oral Preparation/Oral Phase Oral Phase: WFL (WFL for textures assessed)   Pharyngeal Phase Pharyngeal Phase Pharyngeal Phase: Impaired Pharyngeal - Honey Pharyngeal - Honey Teaspoon: Reduced pharyngeal  peristalsis;Pharyngeal residue - valleculae;Reduced tongue base  retraction;Penetration/Aspiration during swallow;Pharyngeal  residue - pyriform sinuses;Reduced laryngeal elevation Penetration/Aspiration details (honey teaspoon):  Material enters  airway, CONTACTS cords and not ejected out Pharyngeal - Honey Cup: Penetration/Aspiration during  swallow;Pharyngeal residue - valleculae;Pharyngeal residue -  pyriform sinuses;Reduced tongue base retraction;Reduced laryngeal  elevation Penetration/Aspiration details (honey cup): Material enters  airway, remains ABOVE vocal cords then ejected out Pharyngeal - Solids Pharyngeal - Puree: Pharyngeal residue - valleculae;Reduced  tongue base retraction;Pharyngeal residue - pyriform  sinuses;Reduced laryngeal elevation  Cervical Esophageal Phase        Cervical Esophageal Phase Cervical Esophageal Phase: [facility]         Darrow Bussing.Ed CCC-SLP Pager [phone]  04/02/2013         Subjective: Patient complains of his chronic abdominal pain which is not worse than usual. Last bowel movement today. No vomiting. He is slightly more short of breath today. He denies any headache, chest pain,  fevers, chills.  Objective: Filed Vitals:   04/19/13 0900 04/19/13 1200 04/19/13 1446 04/19/13 1554  BP:   102/80   Pulse: 95 115 105 115  Temp:   98.2 F (36.8 C)   TempSrc:   Oral   Resp: 24 24 21 22   Height:      Weight:      SpO2: 90% 91% 95% 90%    Intake/Output Summary (Last 24 hours) at 04/19/13 1806 Last data filed at 04/19/13 1230  Gross per 24 hour  Intake      0 ml  Output    475 ml  Net   -475 ml   Weight change: 0.454 kg (1 lb) Exam:   General:  Pt is alert, follows commands appropriately, not in acute distress  HEENT: No icterus, No thrush, [place]/AT  Cardiovascular: RRR, S1/S2, no rubs, no gallops  Respiratory: Scattered bilateral rhonchi.  Abdomen: Soft/+BS, non tender, non distended, no guarding  Extremities: trace LE edema, No lymphangitis, No petechiae, No rashes, no synovitis  Data Reviewed: Basic Metabolic Panel:  Recent Labs Lab 04/14/13 0530 04/15/13 0530 04/16/13 0512 04/17/13 1120 04/18/13 0622 04/19/13 0620  NA 148* 144 145 142 142 141  K  3.6* 3.3* 3.6* 4.0 4.1 4.4  CL 109 105 106 104 104 103  CO2 28 27 28 30 26 25   GLUCOSE 168* 158* 171* 182* 166* 166*  BUN 19 16 16 17 19 20   CREATININE 0.57 0.59 0.62 0.66 0.74 0.65  CALCIUM 8.6 8.3* 8.4 8.3* 8.5 8.6  MG 2.1 2.0 2.0  --  2.1  --   PHOS 3.5 3.5 3.5  --  3.8  --    Liver Function Tests:  Recent Labs Lab 04/14/13 0530 04/16/13 0512 04/18/13 0622  AST 79* 56* 33  ALT 69* 69* 49  ALKPHOS 76 80 74  BILITOT 0.3 0.3 <0.2*  PROT 5.0* 4.9* 5.1*  ALBUMIN 2.2* 2.1* 2.2*   No results found for this basename: LIPASE, AMYLASE,  in the last 168 hours No results found for this basename: AMMONIA,  in the last 168 hours CBC:  Recent Labs Lab 04/15/13 0530 04/16/13 0900 04/17/13 1120 04/18/13 0622 04/19/13 0620  WBC 7.5 8.8 8.4 8.6 8.7  NEUTROABS 5.5  --   --  6.4  --   HGB 8.6* 8.3* 8.0* 8.1* 7.7*  HCT 28.0* 26.4* 26.3* 26.4* 24.6*  MCV 96.9 96.0 96.3 96.0 95.0  PLT 108* 109* 110* 121* 117*   Cardiac Enzymes: No results found for this basename: CKTOTAL, CKMB, CKMBINDEX, TROPONINI,  in the last 168 hours BNP: No components found with this basename: POCBNP,  CBG:  Recent Labs Lab 04/18/13 1631 04/18/13 2143 04/19/13 0605 04/19/13 1115 04/19/13 1604  GLUCAP 177* 145* 154* 166* 193*    Recent Results (from the past 240 hour(s))  CLOSTRIDIUM DIFFICILE BY PCR     Status: None   Collection Time    04/10/13  9:03 AM      Result Value Ref Range Status   C difficile by pcr NEGATIVE  NEGATIVE Final  URINE CULTURE     Status: None   Collection Time    04/13/13  2:31 PM      Result Value Ref Range Status   Specimen Description URINE, RANDOM   Final   Special Requests NONE   Final   Culture  Setup Time     Final   Value: 04/13/2013 18:52     Performed at [facility]  Colony Count     Final   Value: 8,000 COLONIES/ML     Performed at [facility]   Culture     Final   Value: INSIGNIFICANT GROWTH     Performed at [facility]    Report Status 04/14/2013 FINAL   Final  CLOSTRIDIUM DIFFICILE BY PCR     Status: Abnormal   Collection Time    04/16/13  4:44 PM      Result Value Ref Range Status   C difficile by pcr POSITIVE (*) NEGATIVE Final   Comment: CRITICAL RESULT CALLED TO, READ BACK BY AND VERIFIED WITH:     M.CROSSON,RN 1806 04/16/13 M.CAMPBELL     Scheduled Meds: . amiodarone  200 mg Oral BID  . atorvastatin  40 mg Oral Daily  .  ceFAZolin (ANCEF) IV  2 g Intravenous On Call  . chlorhexidine  15 mL Mouth/Throat BID  . diltiazem  120 mg Oral TID WC & HS  . furosemide  40 mg Intravenous Once  . hydrocortisone cream   Topical BID  . insulin aspart  0-9 Units Subcutaneous TID WC  . metoprolol tartrate  12.5 mg Oral BID  . metroNIDAZOLE  500 mg Oral 3 times per day  . pantoprazole  40 mg Oral Daily  . predniSONE  5 mg Oral Q breakfast  . roflumilast  500 mcg Oral Daily  . sodium chloride  10-40 mL Intracatheter Q12H   Continuous Infusions: . sodium chloride    . dextrose 5 % and 0.45 % NaCl with KCl 40 mEq/L 50 mL/hr at 04/18/13 1915  . Marland KitchenTPN (CLINIMIX-E) Adult 60 mL/hr at 04/19/13 1747   And  . fat emulsion 250 mL (04/19/13 1747)  . heparin 1,250 Units/hr (04/19/13 1622)     Kamry Faraci, DO  [facility] Pager [phone]  If 7PM-7AM, please contact night-coverage [email or web address] Password [email or web address] 04/19/2013, 6:06 PM   LOS: 22 days

## 2013-04-19 NOTE — Progress Notes (Signed)
ANTICOAGULATION CONSULT NOTE -follow up Pharmacy Consult for convert xarelto to UFH for PEG placement Indication: atrial fibrillation  Allergies  Allergen Reactions  . Corticosteroids     Inhaled Corticosteroids--Hoarseness, dry mouth  . Furosemide Other (See Comments)    Ototoxicity     Patient Measurements: Height: 5' 8.9" (175 cm) Weight: 204 lb (92.534 kg) IBW/kg (Calculated) : 70.47 Heparin Dosing Weight: 78 kg  Vital Signs: Temp: 98 F (36.7 C) (04/06 2222) Temp src: Oral (04/07 0600) BP: 123/86 mmHg (04/07 0600) Pulse Rate: 107 (04/07 0600)  Labs:  Recent Labs  04/16/13 1230 04/17/13 1100 04/17/13 1120 04/18/13 0622 04/19/13 0620  HGB  --   --  8.0* 8.1* 7.7*  HCT  --   --  26.3* 26.4* 24.6*  PLT  --   --  110* 121* 117*  APTT 30 72*  --  92* 61*  LABPROT 14.3  --   --   --  13.7  INR 1.13  --   --   --  1.07  HEPARINUNFRC 0.22* 0.28*  --  0.35 0.39  CREATININE  --   --  0.66 0.74 0.65    Estimated Creatinine Clearance: 86.7 ml/min (by C-G formula based on Cr of 0.65).   Assessment: Mr. Shirlee LatchMcLean is well known to pharmacy.  PTA he was on pradaxa for afib stroke prevention and this was changed to xarelto because he had trouble swallowing the pradaxa pills.  He was transitioned to UFH on 4/4 so he can have a PEG placed for enteral nutrition support.  Heparin infusion currently running at 1250 units/hr. HL this AM is therapeutic at 0.39. Hg trend down to 7.7, pltc remains low at 117. HIT panel on 4/3 was negative. No bleeding noted.  Goal of Therapy:  Heparin level 0.3-0.7 units/ml Monitor platelets by anticoagulation protocol: Yes   Plan:  1. Continue heparin drip to 1250 units/hr 2. Daily HL and CBC while on heparin 3. Monitor for s/s of bleeding  4. F/u transition back to Xarelto after PEG tube placement today.   Vinnie LevelBenjamin Natahlia Hoggard, PharmD.  Clinical Pharmacist Pager [phone]

## 2013-04-19 NOTE — Progress Notes (Signed)
Cuff inflated due to increased WOB and secretions. Pt tol well. Decreased fio2 to 40% due to stable sats post inflated cuff and trach sxn. RN notified of no PMV trials today.

## 2013-04-20 ENCOUNTER — Inpatient Hospital Stay (HOSPITAL_COMMUNITY): Payer: Medicare HMO

## 2013-04-20 DIAGNOSIS — J9620 Acute and chronic respiratory failure, unspecified whether with hypoxia or hypercapnia: Secondary | ICD-10-CM | POA: Diagnosis present

## 2013-04-20 LAB — BASIC METABOLIC PANEL
BUN: 24 mg/dL — AB (ref 6–23)
CHLORIDE: 102 meq/L (ref 96–112)
CO2: 28 mEq/L (ref 19–32)
CREATININE: 0.75 mg/dL (ref 0.50–1.35)
Calcium: 8.9 mg/dL (ref 8.4–10.5)
GFR calc Af Amer: >90 mL/min (ref >90)
GFR calc non Af Amer: 86 mL/min — ABNORMAL LOW (ref >90)
Glucose, Bld: 212 mg/dL — ABNORMAL HIGH (ref 70–99)
Potassium: 5.1 mEq/L (ref 3.7–5.3)
Sodium: 140 mEq/L (ref 137–147)

## 2013-04-20 LAB — POCT I-STAT 3, ART BLOOD GAS (G3+)
Acid-Base Excess: 1 mmol/L (ref 0.0–2.0)
Bicarbonate: 25.5 mEq/L — ABNORMAL HIGH (ref 20.0–24.0)
O2 Saturation: 93 %
PCO2 ART: 38.9 mmHg (ref 35.0–45.0)
TCO2: 27 mmol/L (ref 0–100)
pH, Arterial: 7.419 (ref 7.350–7.450)
pO2, Arterial: 60 mmHg — ABNORMAL LOW (ref 80.0–100.0)

## 2013-04-20 LAB — BLOOD GAS, ARTERIAL
Acid-Base Excess: 0.2 mmol/L (ref 0.0–2.0)
BICARBONATE: 28.8 meq/L — AB (ref 20.0–24.0)
Drawn by: 205171
FIO2: 1 %
O2 Saturation: 84.5 %
PCO2 ART: 92.1 mmHg — AB (ref 35.0–45.0)
Patient temperature: 98.6
TCO2: 31.6 mmol/L (ref 0–100)
pH, Arterial: 7.122 — CL (ref 7.350–7.450)
pO2, Arterial: 61.5 mmHg — ABNORMAL LOW (ref 80.0–100.0)

## 2013-04-20 LAB — GLUCOSE, CAPILLARY
GLUCOSE-CAPILLARY: 218 mg/dL — AB (ref 70–99)
GLUCOSE-CAPILLARY: 148 mg/dL — AB (ref 70–99)
GLUCOSE-CAPILLARY: 178 mg/dL — AB (ref 70–99)
Glucose-Capillary: 125 mg/dL — ABNORMAL HIGH (ref 70–99)

## 2013-04-20 LAB — HEPARIN LEVEL (UNFRACTIONATED): Heparin Unfractionated: 0.42 IU/mL (ref 0.30–0.70)

## 2013-04-20 LAB — CBC
HCT: 26.4 % — ABNORMAL LOW (ref 39.0–52.0)
HEMOGLOBIN: 8.2 g/dL — AB (ref 13.0–17.0)
MCH: 30 pg (ref 26.0–34.0)
MCHC: 31.1 g/dL (ref 30.0–36.0)
MCV: 96.7 fL (ref 78.0–100.0)
Platelets: 149 10*3/uL — ABNORMAL LOW (ref 150–400)
RBC: 2.73 MIL/uL — AB (ref 4.22–5.81)
RDW: 19.1 % — ABNORMAL HIGH (ref 11.5–15.5)
WBC: 11.2 10*3/uL — ABNORMAL HIGH (ref 4.0–10.5)

## 2013-04-20 MED ORDER — BIOTENE DRY MOUTH MT LIQD
15.0000 mL | Freq: Four times a day (QID) | OROMUCOSAL | Status: DC
  Administered 2013-04-20 (×2): 15 mL via OROMUCOSAL

## 2013-04-20 MED ORDER — SODIUM CHLORIDE 0.9 % IV SOLN
INTRAVENOUS | Status: DC
  Administered 2013-04-25 – 2013-04-30 (×2): 20 mL/h via INTRAVENOUS
  Administered 2013-05-02: 10 mL/h via INTRAVENOUS

## 2013-04-20 MED ORDER — BIOTENE DRY MOUTH MT LIQD
15.0000 mL | Freq: Four times a day (QID) | OROMUCOSAL | Status: DC
  Administered 2013-04-21 – 2013-05-04 (×52): 15 mL via OROMUCOSAL

## 2013-04-20 MED ORDER — TRACE MINERALS CR-CU-F-FE-I-MN-MO-SE-ZN IV SOLN
INTRAVENOUS | Status: AC
  Administered 2013-04-20: 18:00:00 via INTRAVENOUS
  Filled 2013-04-20: qty 2000

## 2013-04-20 MED ORDER — INSULIN ASPART 100 UNIT/ML ~~LOC~~ SOLN
0.0000 [IU] | SUBCUTANEOUS | Status: DC
  Administered 2013-04-20: 2 [IU] via SUBCUTANEOUS
  Administered 2013-04-20: 3 [IU] via SUBCUTANEOUS
  Administered 2013-04-20: 2 [IU] via SUBCUTANEOUS
  Administered 2013-04-21: 3 [IU] via SUBCUTANEOUS
  Administered 2013-04-21: 2 [IU] via SUBCUTANEOUS
  Administered 2013-04-21 (×3): 3 [IU] via SUBCUTANEOUS
  Administered 2013-04-22 (×2): 2 [IU] via SUBCUTANEOUS
  Administered 2013-04-22: 3 [IU] via SUBCUTANEOUS
  Administered 2013-04-22: 2 [IU] via SUBCUTANEOUS
  Administered 2013-04-22: 3 [IU] via SUBCUTANEOUS
  Administered 2013-04-23 (×2): 2 [IU] via SUBCUTANEOUS
  Administered 2013-04-23 (×4): 3 [IU] via SUBCUTANEOUS
  Administered 2013-04-24 – 2013-04-25 (×5): 2 [IU] via SUBCUTANEOUS
  Administered 2013-04-25 (×2): 3 [IU] via SUBCUTANEOUS
  Administered 2013-04-25: 2 [IU] via SUBCUTANEOUS
  Administered 2013-04-26: 3 [IU] via SUBCUTANEOUS
  Administered 2013-04-26 (×3): 2 [IU] via SUBCUTANEOUS
  Administered 2013-04-26: 3 [IU] via SUBCUTANEOUS
  Administered 2013-04-26 – 2013-04-27 (×6): 2 [IU] via SUBCUTANEOUS
  Administered 2013-04-28 (×3): 3 [IU] via SUBCUTANEOUS
  Administered 2013-04-28 (×2): 2 [IU] via SUBCUTANEOUS
  Administered 2013-04-29: 3 [IU] via SUBCUTANEOUS
  Administered 2013-04-29: 2 [IU] via SUBCUTANEOUS
  Administered 2013-04-29 (×2): 3 [IU] via SUBCUTANEOUS
  Administered 2013-04-29 (×2): 2 [IU] via SUBCUTANEOUS
  Administered 2013-04-30 (×3): 3 [IU] via SUBCUTANEOUS
  Administered 2013-04-30: 2 [IU] via SUBCUTANEOUS
  Administered 2013-04-30 – 2013-05-01 (×3): 3 [IU] via SUBCUTANEOUS
  Administered 2013-05-01: 2 [IU] via SUBCUTANEOUS
  Administered 2013-05-01 – 2013-05-02 (×6): 3 [IU] via SUBCUTANEOUS
  Administered 2013-05-02: 2 [IU] via SUBCUTANEOUS
  Administered 2013-05-02: 3 [IU] via SUBCUTANEOUS
  Administered 2013-05-02: 2 [IU] via SUBCUTANEOUS
  Administered 2013-05-02 – 2013-05-03 (×4): 3 [IU] via SUBCUTANEOUS
  Administered 2013-05-03 (×2): 2 [IU] via SUBCUTANEOUS
  Administered 2013-05-03: 3 [IU] via SUBCUTANEOUS
  Administered 2013-05-03: 2 [IU] via SUBCUTANEOUS
  Administered 2013-05-04 (×3): 3 [IU] via SUBCUTANEOUS

## 2013-04-20 MED ORDER — FAT EMULSION 20 % IV EMUL
250.0000 mL | INTRAVENOUS | Status: AC
  Administered 2013-04-20: 250 mL via INTRAVENOUS
  Filled 2013-04-20: qty 250

## 2013-04-20 MED ORDER — FAMOTIDINE IN NACL 20-0.9 MG/50ML-% IV SOLN
20.0000 mg | INTRAVENOUS | Status: DC
  Administered 2013-04-20: 20 mg via INTRAVENOUS
  Filled 2013-04-20 (×2): qty 50

## 2013-04-20 MED ORDER — CHLORHEXIDINE GLUCONATE 0.12 % MT SOLN
15.0000 mL | Freq: Two times a day (BID) | OROMUCOSAL | Status: DC
  Administered 2013-04-20 – 2013-05-04 (×28): 15 mL via OROMUCOSAL
  Filled 2013-04-20 (×30): qty 15

## 2013-04-20 NOTE — Progress Notes (Signed)
PT Cancellation Note  Patient Details Name: Tim Short MRN: [id number] DOB: 02/26/1934   Cancelled Treatment:    Reason Eval/Treat Not Completed: Medical issues which prohibited therapy.  Pt transferred this am to ICU.  Having respiratory issues throughout the day and is now somewhat agitated. 04/20/2013  Tim Short, PT [phone]  (pager)   Tim Short 04/20/2013, 4:50 PM

## 2013-04-20 NOTE — Progress Notes (Signed)
PARENTERAL NUTRITION CONSULT NOTE - FOLLOW UP  Pharmacy Consult for TPN Indication: inadequate PO intake, resolving SBO  Allergies  Allergen Reactions  . Corticosteroids     Inhaled Corticosteroids--Hoarseness, dry mouth  . Furosemide Other (See Comments)    Ototoxicity     Patient Measurements: Height: 5' 8.9" (175 cm) Weight: 204 lb (92.534 kg) IBW/kg (Calculated) : 70.47   Vital Signs: Temp: 97.8 F (36.6 C) (04/08 0453) Temp src: Oral (04/08 0453) BP: 125/81 mmHg (04/08 0453) Pulse Rate: 91 (04/08 0730) Intake/Output from previous day: 04/07 0701 - 04/08 0700 In: 1517.5 [P.O.:60; I.V.:687.5; TPN:770] Out: 1204 [Urine:1200; Stool:4] Intake/Output from this shift: Total I/O In: 10 [I.V.:10] Out: -   Labs:  Recent Labs  04/17/13 1100  04/18/13 0622 04/19/13 0620 04/20/13 0353  WBC  --   < > 8.6 8.7 11.2*  HGB  --   < > 8.1* 7.7* 8.2*  HCT  --   < > 26.4* 24.6* 26.4*  PLT  --   < > 121* 117* 149*  APTT 72*  --  92* 61*  --   INR  --   --   --  1.07  --   < > = values in this interval not displayed.   Recent Labs  04/18/13 0622 04/19/13 0620 04/20/13 0353  NA 142 141 140  K 4.1 4.4 5.1  CL 104 103 102  CO2 26 25 28   GLUCOSE 166* 166* 212*  BUN 19 20 24*  CREATININE 0.74 0.65 0.75  CALCIUM 8.5 8.6 8.9  MG 2.1  --   --   PHOS 3.8  --   --   PROT 5.1*  --   --   ALBUMIN 2.2*  --   --   AST 33  --   --   ALT 49  --   --   ALKPHOS 74  --   --   BILITOT <0.2*  --   --   PREALBUMIN 18.6  --   --   TRIG 114  --   --    Estimated Creatinine Clearance: 86.7 ml/min (by C-G formula based on Cr of 0.75).    Recent Labs  04/19/13 1604 04/19/13 2101 04/20/13 0600  GLUCAP 193* 166* 218*    Insulin Requirements in the past 24 hours:  3 units of Novolog + 15 units regular insulin in TPN bag  Current Nutrition:  -Dysphagia 2 diet with poor PO intake documented  -Ensure pudding po TID prn- each supplement provides 170kcal and 4g protein  (Currently being refused by patient) -Clinimix E 5/15 to 60 ml/hr + 20% lipid emulsion at 10 ml/hr (provides 1502kCal ~72% of goal and 72g protein ~65% of goal per day)   Nutritional Goals:  2050-2250 kCal, 110-120 grams of protein per day per RD 4/1  Admit: Transferred from [facility] with abdominal pain, partial SBO, ileus.  GI: Hx GERD, PUD - resolved ileus per surgery.  Fall 2014 - volvulus, multiple abd surgeries, fistula development, was on TPN long-term but recently stopped and was tolerating PO intake per report. Now with poor PO intake. Now on Dysphagia 2 diet w/ no N/V and good bowel movement. Please see RD note 3/27: recs for short-term enteral support via NGT is appropriate as he has a functioning GI tract. Prealbumin trended up to 18.6 on 4/6. PEG tube was planned for yesterday but per Dr. Miles CostainShick, still too much barium in stomach to safely perform perc G tube.  Endo: Hx DM (  A1c 6.4) - CBG mostly controlled but >200 this AM with SSI + insulin in TPN, continues on daily PO prednisone  Lytes: Na 140, K 5.1, phos and Mg WNL. Corr Ca 9.9, MIVF stopped on 4/8 likely due to K trending up  Renal: SCr stable at 0.74  Pulm: Hx asthma, OSA - 95% 0.35 TC -  Daliresp  Cards: Hx afib, HTN, HLD, CAD - BP 125/81, HR 91 (NSR s/p cardioversion 3/27 but now in afib again) - amio, lipitor, dilt, heparin (transitioned from Xarelto for PEG tube placement)  Hepatobil: LFTs now trended down to nl, Tbili WNL, trigs WNL- HIT panel neg  Neuro: Hx RLS, back pain, migraines - A&O  ID: Completed a 7d course of abx for HCAP 3/26 - Afebrile, WBC WNL - flagyl D#4 for Cdiff  Best Practices: Heparin infusion, MC, PPI PO  TPN Access: PICC placed 3/22  TPN day#: 14  Plan:  1. Increase rate and change to Clinimix E 5/15 at 83 ml/hr without electrolytes + 20% lipid emulsion at 10 ml/hr (provides 1894kCal (~92% of goal) and 100g protein (91% of goal) per day) since PEG tube delayed  2. Continue SSI + 15 units  insulin in TPN - monitor  3. Daily MVI; trace elements M/W/F only due to national shortage  4. F/u AM labs and K  5. F/u PEG placement and ability to start enteral diet  Vinnie Level, PharmD.  Clinical Pharmacist Pager [phone]

## 2013-04-20 NOTE — Progress Notes (Signed)
Patient ID: Tim BarerJames B Pooley, male   DOB: 09/16/1934, 79 y.o.   MRN: [id number]   Still with too much barium in stomach to safely perform perc G tube per Dr Miles CostainShick  Will ask RN to have pt lay on Rt side down as much as possible today Restart Hep now  Rescheduled again for 4/9 Re check kub in am

## 2013-04-20 NOTE — Progress Notes (Signed)
Patient transferred to [facility]. 2W social worker provided handoff to Child psychotherapistsocial worker on 2H. This Child psychotherapistsocial worker signing off.  Maree KrabbeLindsay Shaneka Efaw, MSW, Theresia MajorsLCSWA [phone]

## 2013-04-20 NOTE — Progress Notes (Signed)
SLP Cancellation Note  Patient Details Name: Rush BarerJames B Kanzler MRN: [id number] DOB: 06/21/1934   Cancelled treatment:       Reason Eval/Treat Not Completed: Patient at procedure or test/unavailable;Medical issues which prohibited therapy. Following via chart for therapy readiness.    Riley NearingBonnie Caroline Antoinette Borgwardt 04/20/2013, 9:53 AM

## 2013-04-20 NOTE — Consult Note (Signed)
PULMONARY / CRITICAL CARE MEDICINE   Name: Rush BarerJames B Ashkar MRN: [id number] DOB: 10/13/1934    ADMISSION DATE:  03/28/2013 CONSULTATION DATE:  04/20/2013  REFERRING MD :  Dr. Vanessa BarbaraZamora PRIMARY SERVICE:  PCCM  CHIEF COMPLAINT:  AMS / Hypoxia   BRIEF PATIENT DESCRIPTION: 79 yo with complicated, prolonged illness since 09/2012 with respiratory failure, tracheostomy, dysphagia, cholecystitis s/p cholecystectomy, colon fistula, IVC filter placement, SBO, AF with RVR.  4/8 patient decompensated on medical floor with AMS and hypoxia.  PCCM consulted for evaluation.    SIGNIFICANT EVENTS / STUDIES:   [facility], developed volvulus requiring R colectomy.  Subsequently developed multiple abscesses, enteric fistulas that were managed with percutaneous drains 10/2012 - prolonged resp failure requiring trach 10/29/12 12/2012 - [facility] - Tx to [facility]. 03/07/13 - Developed abd pain, distention at [facility].  [facility] for gallbladder removal, resection of colon fistula, IVC filter placement 03/2013 - Returned back to [facility] 03/16/13, started on TPN.  Passed swallow eval 3/9 03/28/13 - Progressive abd pain / distension.  [facility] abdomen with early SBO/Ileus.  [facility] - SBO resolved.  Surgery s/o.  Developed Afib with RVR.  Cardioverted per Cardiology.  SR 3/28-3/29 with plan for d/c on 3/31 but went back into fib on 3/30 -on heparin gtt.  TPN continued.   04/20/13 - decompensated & Tx to ICU  LINES / TUBES: Trach (chronic)>>> LUE PICC 3/22>>>  CULTURES: Sputum 3/22>>> KLEBSIELLA BCx2 3/26>>>neg C-Diff 3/29>>>neg UC 4/2>>>neg C-Diff 4/4 >>> POSITIVE  ANTIBIOTICS: Flagyl (PO) 4/4>>>  HISTORY OF PRESENT ILLNESS:  79 y/o M with PMH of HTN, HLD, CAD s/p cath with PCI, DM, Obesity, OSA on CPAP (12 cm), GERD, PUD, Chronic Back Pain who has had an extensive / prolonged critical illness.  [facility]  [facility] a failed MAIZE procedure.  He had multiple complications to include:  Development of volvulus requiring R colectomy (10/08/12), multiple abscesses / enteric fistulas requiring percutaneous drains, prolonged respiratory failure requiring tracheostomy, dysphagia, atrial fibrillation that has been refractory to medications / cardioversion, IVC filter placement, multiple facility transfers ([facility]).    [facility] with increased abdominal distention and pain.  He was found to have a CT of ABD consistent with SBO / Ileus and [facility]  CCS consulted and managed SBO which slowly resolved and surgery had signed off by 3/27.  During admit, he has had difficult to manage atrial fibrillation / flutter that has been refractory to amiodarone and cardioversion.    PCCM consulted am of 4/8 after patient was noted to have increased lethargy in the setting of increased secretions, hypoxemia with saturations into the 80's and accessory muscle usage.  Transferred to ICU for further care.  Pt was slotted for PEG tube placement on 4/8 however, has been delayed due to slow moving barium in stomach.    PAST MEDICAL HISTORY :  Past Medical History  Diagnosis Date  . Hypertension   . Hypercholesteremia   . Asthma   . Meniere disease   . Persistent atrial fibrillation     a. s/p multiple dccv's;  b. failed amio;  c. not felt to be AF RFCA canddiate due to LA dil;  d. s/p failed hybrid ablation @ [facility] in 09/2012;  e. chronic pradaxa.  .Marland Kitchen  Obesity   . Biatrial enlargement     LA size 5.3cm  . Obstructive sleep apnea     AHI 108/hr now on CPAP at 12cm H2O  . H/O hiatal hernia   . GERD (gastroesophageal reflux disease)     "associated w/hiatal hernia" (06-Jun-2012)  . Peptic ulcer 1950's  . Migraines     "haven't had one for about 15 years" (Jun 06, 2012)  . Arthritis     "joints" (06-Jun-2012)  . Chronic lower back  pain   . Nephrolithiasis ~ 1960's    "passed on their own" (06/06/12)  . History of pneumonia 2002; 2006    "spent 8 days in isolation; Norovirus" (2012-06-06)  . Other and unspecified angina pectoris   . RLS (restless legs syndrome)   . Coronary artery disease     a. 05/2012 Cath/PCI: LM nl, LAD nl, LCX 74m (3.0x15 Integrity BMS), PTCA of OM1 through stent struts (kissing balloon).  . Diabetes mellitus without complication     FAMILY STATES PATIENT IS NOT DIABETIC   Past Surgical History  Procedure Laterality Date  . Wrist fracture surgery  1992    "repaired w/left hip bone graft" (06-06-2012)  . Total knee arthroplasty  08/19/1999  . Colonoscopy w/ polypectomy  2006  . Knee arthroscopy with meniscal repair Right 1974    "medial meniscus repaired" (2012/06/06)  . Cardioversion  10/02/2011    Procedure: CARDIOVERSION;  Surgeon: Corky Crafts, MD;  [facility] ENDOSCOPY;  Service: Cardiovascular;  Laterality: N/A;  h/p in file drawer  . Cardioversion  11/07/2011    Procedure: CARDIOVERSION;  Surgeon: Corky Crafts, MD;  Location: [facility] ENDOSCOPY;  Service: Cardiovascular;  Laterality: N/A;  h/p from 10/22 in file drawer/dl  . Cardiac catheterization  05/26/2012  . Coronary angioplasty with stent placement  06/06/12    "1" (06/06/12)  . Cataract extraction w/ intraocular lens  implant, bilateral  2009  . Hemorrhoid surgery  ~ 2003  . Hiatal hernia repair  1983  . Nissen fundoplication  1983  . Ablation of dysrhythmic focus  SEPT/OCT 2014    ATRIAL FIB  . Inguinal hernia repair Bilateral 2000 and 2005    "one done at a time" (06-06-2012)  . Colon surgery  10/08/2012   . Tracheostomy  OCT 2014  . Cardioversion N/A 04/08/2013    Procedure: CARDIOVERSION    (BEDSIDE) ;  Surgeon: Luis Abed, MD;  Location[facility] OR;  Service: Cardiovascular;  Laterality: N/A;   Prior to Admission medications   Medication Sig Start Date End Date Taking? Authorizing Provider  acetaminophen  (TYLENOL) 500 MG tablet Take 500 mg by mouth every 6 (six) hours as needed for pain or fever.    Yes Historical Provider, MD  albuterol (PROVENTIL HFA;VENTOLIN HFA) 108 (90 BASE) MCG/ACT inhaler Inhale 2 puffs into the lungs every 6 (six) hours as needed for wheezing or shortness of breath.    Yes Historical Provider, MD  atorvastatin (LIPITOR) 40 MG tablet Take 40 mg by mouth daily.   Yes Historical Provider, MD  chlorhexidine (PERIDEX) 0.12 % solution Use as directed 15 mLs in the mouth or throat 2 (two) times daily.   Yes Historical Provider, MD  dabigatran (PRADAXA) 150 MG CAPS capsule Take 150 mg by mouth 2 (two) times daily.   Yes Historical Provider, MD  diltiazem (DILACOR XR) 240 MG 24 hr capsule Take 240 mg by mouth daily.   Yes Historical Provider, MD  insulin regular (NOVOLIN R,HUMULIN R) 100 units/mL  injection Inject 0-5 Units into the skin every 6 (six) hours. 70-150=0 units, 151-200=1units, 201-250= 2 units, 251-300= 3 units, 301-350 = 4 units, 351-400= 5 units, 401+= call Prescriber   Yes Historical Provider, MD  ipratropium-albuterol (DUONEB) 0.5-2.5 (3) MG/3ML SOLN Take 3 mLs by nebulization every 6 (six) hours as needed.   Yes Historical Provider, MD  isosorbide mononitrate (IMDUR) 30 MG 24 hr tablet Take 30 mg by mouth daily.   Yes Historical Provider, MD  lisinopril (PRINIVIL,ZESTRIL) 5 MG tablet Take 5 mg by mouth at bedtime.   Yes Historical Provider, MD  meclizine (ANTIVERT) 25 MG tablet Take 25 mg by mouth 3 (three) times daily as needed for dizziness.    Yes Historical Provider, MD  nitroGLYCERIN (NITROSTAT) 0.4 MG SL tablet Place 0.4 mg under the tongue every 5 (five) minutes as needed for chest pain.   Yes Historical Provider, MD  oxycodone (OXY-IR) 5 MG capsule Give 5 mg by tube 2 (two) times daily as needed for pain.   Yes Historical Provider, MD  pantoprazole sodium (PROTONIX) 40 mg/20 mL PACK Place 40 mg into feeding tube 2 (two) times daily.   Yes Historical Provider, MD   piperacillin-tazobactam (ZOSYN) 3-0.375 G injection Inject 3.375 g into the muscle every 6 (six) hours. For 10 days 03/22/13  Yes Historical Provider, MD  potassium chloride SA (K-DUR,KLOR-CON) 20 MEQ tablet Take 20 mEq by mouth daily.   Yes Historical Provider, MD  predniSONE (DELTASONE) 10 MG tablet Take 10 mg by mouth daily with breakfast.   Yes Historical Provider, MD  roflumilast (DALIRESP) 500 MCG TABS tablet Take 500 mcg by mouth daily.   Yes Historical Provider, MD  sitaGLIPtin (JANUVIA) 100 MG tablet Take 100 mg by mouth daily.   Yes Historical Provider, MD  Sodium Chloride (HYPERSAL) 3.5 % NEBU 1 vial by Intratracheal route every 6 (six) hours.   Yes Historical Provider, MD  sodium chloride 0.9 % SOLN 500 mL with vancomycin 10 G SOLR 1,500 mg Inject 1,500 mg into the vein daily.   Yes Historical Provider, MD  Vitamins A & D (VITAMIN A & D) ointment Apply 1 application topically as needed (to sacral area).   Yes Historical Provider, MD   Allergies  Allergen Reactions  . Corticosteroids     Inhaled Corticosteroids--Hoarseness, dry mouth  . Furosemide Other (See Comments)    Ototoxicity    FAMILY HISTORY:  Family History  Problem Relation Age of Onset  . Congestive Heart Failure    . COPD Father   . Heart failure Sister   . Heart failure Brother   . CVA Brother   . Heart attack Brother   . Heart disease Brother   . CVA Brother   . Heart disease Brother    SOCIAL HISTORY:  reports that he quit smoking about 31 years ago. His smoking use included Pipe. He has never used smokeless tobacco. He reports that he does not drink alcohol or use illicit drugs.  REVIEW OF SYSTEMS:  Unable to complete as pt is on vent with altered mental status.   SUBJECTIVE:   VITAL SIGNS: Temp:  [97.8 F (36.6 C)-98.6 F (37 C)] 97.8 F (36.6 C) (04/08 0453) Pulse Rate:  [91-118] 91 (04/08 0730) Resp:  [20-28] 26 (04/08 0730) BP: (102-136)/(80-84) 125/81 mmHg (04/08 0453) SpO2:  [83 %-95 %]  87 % (04/08 0730) FiO2 (%):  [40 %-100 %] 100 % (04/08 0730) Weight:  [204 lb (92.534 kg)] 204 lb (92.534 kg) (04/08  1610)  HEMODYNAMICS:    VENTILATOR SETTINGS: Vent Mode:  [-]  FiO2 (%):  [40 %-100 %] 100 %  INTAKE / OUTPUT: Intake/Output     04/07 0701 - 04/08 0700 04/08 0701 - 04/09 0700   P.O. 60    I.V. (mL/kg) 687.5 (7.4)    TPN 770    Total Intake(mL/kg) 1517.5 (16.4)    Urine (mL/kg/hr) 1200 (0.5)    Stool 4 (0)    Total Output 1204     Net +313.5          Urine Occurrence 3 x    Stool Occurrence 3 x      PHYSICAL EXAMINATION: General:  Chronically ill appearing adult male in NAD on vent Neuro:  Awake, follows commands HEENT:  Mm pale, moist.  Trach midline (?portex) c/d/i Cardiovascular:  s1s2 irr irr, no m/r/g Lungs:  Mild tachypnea, lungs bilaterally with scattered rhonchi Abdomen:  Round/soft, bsx4 hypoactive Musculoskeletal:  No acute deformities Skin:  Warm/dry, trace edema in BLE  LABS:  CBC  Recent Labs Lab 04/18/13 0622 04/19/13 0620 04/20/13 0353  WBC 8.6 8.7 11.2*  HGB 8.1* 7.7* 8.2*  HCT 26.4* 24.6* 26.4*  PLT 121* 117* 149*   Coag's  Recent Labs Lab 04/16/13 1230 04/17/13 1100 04/18/13 0622 04/19/13 0620  APTT 30 72* 92* 61*  INR 1.13  --   --  1.07   BMET  Recent Labs Lab 04/18/13 0622 04/19/13 0620 04/20/13 0353  NA 142 141 140  K 4.1 4.4 5.1  CL 104 103 102  CO2 26 25 28   BUN 19 20 24*  CREATININE 0.74 0.65 0.75  GLUCOSE 166* 166* 212*   Electrolytes  Recent Labs Lab 04/15/13 0530 04/16/13 0512  04/18/13 0622 04/19/13 0620 04/20/13 0353  CALCIUM 8.3* 8.4  < > 8.5 8.6 8.9  MG 2.0 2.0  --  2.1  --   --   PHOS 3.5 3.5  --  3.8  --   --   < > = values in this interval not displayed. Sepsis Markers No results found for this basename: LATICACIDVEN, PROCALCITON, O2SATVEN,  in the last 168 hours ABG  Recent Labs Lab 04/20/13 0830  PHART 7.122*  PCO2ART 92.1*  PO2ART 61.5*   Liver Enzymes  Recent  Labs Lab 04/14/13 0530 04/16/13 0512 04/18/13 0622  AST 79* 56* 33  ALT 69* 69* 49  ALKPHOS 76 80 74  BILITOT 0.3 0.3 <0.2*  ALBUMIN 2.2* 2.1* 2.2*   Cardiac Enzymes No results found for this basename: TROPONINI, PROBNP,  in the last 168 hours Glucose  Recent Labs Lab 04/18/13 2143 04/19/13 0605 04/19/13 1115 04/19/13 1604 04/19/13 2101 04/20/13 0600  GLUCAP 145* 154* 166* 193* 166* 218*   IMAGING:   Dg Chest Port 1 View  04/19/2013   CLINICAL DATA:  Chronic respiratory failure and worsening shortness of Breath  EXAM: PORTABLE CHEST - 1 VIEW  COMPARISON:  04/10/2013  FINDINGS: A left-sided PICC line, tracheostomy tube and postsurgical changes are again seen and stable. The cardiac shadow is stable. Left-sided pleural effusion is again identified as well as left retrocardiac atelectasis. No focal chondral no other focal infiltrate is seen.  IMPRESSION: Stable changes in the left base.  No new focal abnormality is noted.   Electronically Signed   By: Alcide Clever M.D.   On: 04/19/2013 12:27   Dg Abd Portable 1v  04/20/2013   CLINICAL DATA:  Evaluate retained barium  EXAM: PORTABLE ABDOMEN - 1  VIEW  COMPARISON:  Abdomen film of 04/19/2013.  FINDINGS: Barium is now scattered throughout small bowel with some contrast in large bowel as well. No bowel distention is seen.  IMPRESSION: Barium is scattered throughout primarily small bowel with some contrast within colon as well.   Electronically Signed   By: Dwyane Dee M.D.   On: 04/20/2013 08:08   Dg Abd Portable 1v  04/19/2013   CLINICAL DATA:  Evaluate barium in bowel  EXAM: PORTABLE ABDOMEN - 1 VIEW  COMPARISON:  04/07/2013  FINDINGS: Oral contrast is seen in the gastric fundus. The remainder of the stomach does not show significant contrast. There is no significant barium in the larg or small bowel. Negative for bowel obstruction.  IVC filter unchanged at the L2-3 level.  IMPRESSION: Barium is localized to the gastric fundus. Negative for  bowel obstruction.   Electronically Signed   By: Marlan Palau M.D.   On: 04/19/2013 08:05   ASSESSMENT / PLAN:  PULMONARY A: Acute on chronic respiratory failure Mucus plug? Tracheostomy status  OSA  Stable L effusion COPD? No acute bronchospasm P:   Goal pH>7.30, SpO2>92 Continuous mechanical support VAP bundle Daily SBT Trend ABG/CXR Xopenex PRN D/c Daliresp  No indication for systemic steroids No indication for thoracentesis  CARDIOVASCULAR A:  AF / RVR HTN HLD CAD, s/p cath with PCI P:  Amiodarone, Cardizem, Lopressor, Lipitor Lopressor for HR >110  RENAL A:   Fluid overlaod P:   Trend BMP Lasix  GASTROINTESTINAL A:   Dysphagia GERD Pseudomembranous colitis P:   TNA Pending PEG tube per IR once barium has safely cleared stomach D/c Protonix Start Pepcid  HEMATOLOGIC A:   Chronic anemia Therapeutic anticoagulation for AF P:  Trend CBC Heparin gtt  INFECTIOUS A:   Pseudomembranous colitis P:   Flagyl  ENDOCRINE A:   DM II At risk for adrenal insufficiency ( chronic low dose Prednisone ? ) P:   SSI, change to q4h D/c Prednisone Hydrocortisone if hypotensive  NEUROLOGIC A:   Acute encephalopathy P:   D/c Xanax Morphine PRN  Canary Brim, NP-C [facility] Pgr: [phone] or [phone]  04/20/2013, 9:01 AM  I have personally obtained history, examined patient, evaluated and interpreted laboratory and imaging results, reviewed medical records, formulated assessment / plan and placed orders.  CRITICAL CARE:  The patient is critically ill with multiple organ systems failure and requires high complexity decision making for assessment and support, frequent evaluation and titration of therapies, application of advanced monitoring technologies and extensive interpretation of multiple databases. Critical Care Time devoted to patient care services described in this note is 45 minutes.   Lonia Farber,  MD Pulmonary and Critical Care Medicine [facility] Pager: [phone]  04/20/2013, 11:12 AM

## 2013-04-20 NOTE — Progress Notes (Signed)
PROGRESS NOTE  Tim Short:[id number] DOB: [birth year] DOA: 03/28/2013 PCP:  Duane Lope, MD Interim History Pt is 79 yo male with fairly recent hospitalization at [facility] in September 2014 for failed maize procedure and during that hospitalization pt developed volvulus and has required right colectomy (10/08/2012), subsequently developed multiple abscesses and enteric fistulas managed percutaneous drains. Pt was in prolonged respiratory failure at [facility], unable to wean of the vent and requiring trach placement 10/29/2012. He was transferred to [facility] in December 2014 and later to [facility] in late January 2015. He was on TNA and was doing fairly well initially, but developed more abdominal pain and on 2/23 taken to OR in [place] for gallbladder removal (secondary to cholecystitis), resection for part of the colon for fistula, IVC filter placement. Sent back to [facility] on 03/16/13 and started on TPN. Diet advanced on March 9th, 2015 after pt passed swallow evaluation. He was brought to [facility] March 16th, after noticing progressive abdominal distension. In ED, CT abdomen with air- fluid level and findings consistent with early partial SBO, ileus.  Patient admitted to [facility] service 03/29/2013 with surgical consult, his SBO slowly resolved and surgery signed off 3/27. Throughout his hospitalization patient developed Atrial fibrillation with RVR to rated of 130s-140s, stable, and cardiology was consulted. Patient had several up titration of his medications including addition of Amiodarone, and was d/c cardioverted on 3/27. He maintained sinus rhythm through 3/28-3/29 with plans for discharge on 3/31, however on 3/30 went back into A fib with RVR and cardiology has been re consulted.   Electrophysiology was consulted and the patient's Cardizem CD was increased to 240 mg twice a day. His heart rate improved from 130s to 120s. Metoprolol tartrate was started with improvement of the heart rate to 110s. Cardiology has  signed off but may need to be reconsulted. The patient was initially started on TPN with hopes that this would be temporary. However there is no significant improvement in the patient's nutritional or clinical status. As a result, IR was consulted for gastrostomy tube placement. It is hopeful that this will be placed on 04/20/2013. He was initially scheduled for 04/19/2013, but contrast transit beyond the stomach was slow.  The patient's insurance has denied LTAC transfer.  Case management is working on appealing this as well as the patient's son. In the last 24 hours, the patient has an increasing frothy tracheal secretions. Chest x-ray revealed increasing vascular congestion. The patient was started on intravenous Lasix.   I was called by RN notified of change in status. Evaluated patient at bedside, appears lethargic using accessory muscles, now requiring 80% O2, up from 40%. Has trach collar. He is able to follow simple commands for me.   Assessment/Plan: Acute on Chronic respiratory failure  -Had been on  trach collar 40% oxygenation, now requiring 80% and becoming lethargic, using accessory muscles.  -Had to increase oxygenation past 24hr -May be due to volume overload, giving IV lasix this am. -Obtain CXR and ABG -Transfer to ICU, case discussed with PCCM. -Son notified of change in status.   Atrial fibrillation/flutter  -appreciate cardiology/EP following  -s/p cardioversion 3/27, restored sinus rhythm and maintained over the weekend -04/11/13 am--back in A fib with RVR, rates in the 130s.  -Cardiology reconsulted, appreciate input.  -continue amiodarone 200mg  bid per EP  -Defer chronotropic control to cardiology--diltiazem CD increased to 240bid  -HR improving 100-110 with addition of metoprolol tartrate  -Continue rivaroxaban  -  add metoprolol tartrate 12.5 mg by mouth daily with HR 100-110 -Discontinued rivaroxaban and started the patient on IV heparin in preparation for gastrostomy  tube placement--restart Xarelto after PEG tube -Consult IR for gastrostomy tube placement--tentatively planned for 04/19/2013   Clostridium difficile colitis  -Flagyl started 04/16/2013  -Partly contributing to abdominal pain  -fewer BMs, but still loose stools   Abdominal pain  - secondary to ileus, partial SBO as observed on admission as well as Clostridium difficile colitis presently  -ileus/SBO resolved, patient with bowel movements  -has a degree of abdominal pain which is stable.  - continue supportive care with analgesia, antiemetics as needed for symptom control.  - nutrition consulted, appreciate input,  -patient is not meeting his nutritional needs based on oral intake.  - patient with intermittent confusion last week, which is now better, and on anticoagulation for his A fib  -Consult IR for gastrostomy tube placement--tentatively planned on 04/19/13  -TPN initiated 3/26 and that can be weaned off if po intake improves and pt improves clinically  -long discussion with son regarding risks and benefits of enteral feeding vs TPN--he is amenable to gastrostomy tube placement  -continue oral intake as tolerated, had another swallow evaluation 3/30, his diet was changed from Dys1 to Dys 2,   Moderate malnutrition  - secondary to acute on chronic illness as outlined above  - pt tolerating well however does not seem to meet his nutritional requirements per nutrition  - on TPN, appreciate nutrition and pharmacy consults.  -Ultimately plan to switch the patient enteral feeding   Thrombocytopenia  -Suspect myelosuppression due to acute medical illness--stable, slowly improving  -Check serum B12--558  -RBC folate--950  -Peripheral smear--no schistocytes  -Fibrinogen--355  -HIT panel--results pending  -INR 1.14   Leukocytosis  - likely secondary to ileus, HCAP as suggestive per CXR 3/20  - CT abd/pelvis with mild leak around anastomosis but no other acute events, and surgery signed  off  - leukocytosis has now resolved   Dysuria  -UA neg for pyuria   HCAP -  -Finished one week cefepime/levoflox and vancomycin 04/07/2013  -Remains afebrile without any respiratory distress   Acute on chronic diastolic CHF - monitoring I's/O's, daily weights  - weight trend: 191 >>204 -start Lasix 40mg  IV bid - repeat CXR 4/7-->increase vascular congestion  Hypokalemia - continue supplementation. Magnesium normal.   Sacral ulcer - skin breakdown noted, closely monitor. Nursing care. No evidence of infection.   Anemia of chronic disease - no signs of active bleeding. Likely multifactorial ?medication induced, nutritional deficiencies. Closely monitor.   Essential hypertension -  -controlled   Tracheostomy status  -pt requires frequent suctioning--will benefit going to LTAC  -Has had numerous episodes of mucous plugging requiring aggressive suctioning  -concerned about ability to provide level of care he needs at [facility]  - Patient with a desaturation event 3/30 evening requiring suctioning.  -pulmonary toilet   Hypernatremia - TPN per pharmacy. Improving, supplemented 3/30 with D5W.  Family Communication: Son notified of change in patient's status and transfer to ICU request made Disposition Plan: [name] vs [facility]          Procedures/Studies: Ct Abdomen Pelvis W Contrast  04/04/2013   CLINICAL DATA:  Liver flight non.  Leukocytosis.  EXAM: CT ABDOMEN AND PELVIS WITH CONTRAST  TECHNIQUE: Multidetector CT imaging of the abdomen and pelvis was performed using the standard protocol following bolus administration of intravenous contrast.  CONTRAST:  90mL OMNIPAQUE IOHEXOL 300 MG/ML  SOLN  COMPARISON:  DG ABD PORTABLE 1V dated 04/04/2013; CT ABD/PELVIS W CM dated 03/28/2013; CT ABD-PELV W/O CM dated 03/11/2013; CT ABD/PELVIS W CM dated 01/30/2013  FINDINGS: Stable moderate left and small right pleural effusions with associated passive atelectasis. Mild cardiomegaly.  Postoperative findings along the gastroesophageal junction.  Slightly reduce marginal definition of the infiltrative hypodense process involving the right hepatic lobe and portions of segment 4 of the liver.  The spleen, adrenal glands, and pancreas unremarkable. Small amount of fluid along the gallbladder fossa and along the inferior edge of the right hepatic lobe could, similar to prior. Gallbladder surgically absent.  There is contrast medium in the renal collecting systems on the initial portal venous phase images, likely due to inadvertent early injection.  Stable renal hypodensities. Infrarenal IVC filter. Continued fluid along the inferior margin of the laparotomy wound. Trace fluid in the lower omentum. Stable trace presacral edema.  Bilateral chronic pars defects at L5 observed with 9 mm of anterolisthesis.  Orally administered contrast extends through to the rectum. Mild circumferential rectal wall thickening.  The patient seems to have had right hemicolectomy, with reanastomosis to the small bowel on image 39 of series 5. Outpouching from the proximal terminal margin of the remaining colon shown on images 32 through 21 of series 5, potentially some type of postoperative diverticulum or contained leak along the stable site.  IMPRESSION: 1. Similar distribution but reduced conspicuity of the peripheral infiltrative hepatic hypodensity affecting the right hepatic lobe an adjacent portion of segment 4. Fatty infiltration is the most common cause for and infiltrative hypodensity of this type, although localized parenchymal inflammation is not entirely excluded. MRI could differentiate if clinically warranted. 2. Small amount of perihepatic ascites, similar to prior. 3. Hypodense renal lesions, similar to prior. 4. Patient appears to have had right hemicolectomy with small bowel reanastomosis. There is a collection of gas and contrast extending to the right of the proximal terminal margin of the remaining  colon, potentially a small contained leak along the staple line. 5. Continued fluid along the inferior margin of the laparotomy wound, but without internal gas density currently.   Electronically Signed   By: Tim Short M.D.   On: 04/04/2013 18:40   Ct Abdomen Pelvis W Contrast  03/28/2013   CLINICAL DATA:  Abdominal distention.  EXAM: CT ABDOMEN AND PELVIS WITH CONTRAST  TECHNIQUE: Multidetector CT imaging of the abdomen and pelvis was performed using the standard protocol following bolus administration of intravenous contrast.  CONTRAST:  OMNIPAQUE IOHEXOL 300 MG/ML  SOLN  COMPARISON:  DG ABD ACUTE W/CHEST dated 03/28/2013; CT ABD-PELV W/O CM dated 03/11/2013; CT ABD/PELVIS W CM dated 01/30/2013  FINDINGS: There is a new lucency is again noted in the liver. This remains unchanged in appearance and may be infectious in etiology. No clearcut fluid collection in or air collection is noted in the liver to suggest a definite abscess. These changes may be related to phlegmon. Close follow up to evaluate for developing abscess suggested. Hepatic veins patent. Splenic and portal veins patent. No focal significant splenic abnormality. Pancreas normal. Cholecystectomy. No biliary distention.  Adrenals normal. Stable approximate 13 mm low-density lesion in the left kidney, not definite simple cyst. As noted on prior study, MRI of the kidneys suggest for further evaluation. No hydronephrosis. The bladder is nondistended. No free pelvic fluid. Prostate is not enlarged. Calcifications within the prostate. No significant adenopathy. Abdominal aorta is atherosclerotic. No aneurysm. Visceral vessels are patent. Inferior vena caval filter noted  in the IVC with tip just below the renal veins.  Previously identified drainage catheter in the right upper quadrant has been removed. Inflammatory changes are noted in the right mid abdomen, no abscess noted. The patient has had a prior hemicolectomy. Reference is made to CT  report of 01/31/2012 which discusses fistula changes within the abdomen. Patient status post right hemicolectomy. The colon is moderately distended and contains fluid levels. These changes have improved from prior exam suggesting improving ileus. These changes may be related to a diarrheal illness. Follow-up abdominal series suggested. No small bowel distention. The stomach is nondistended. No free air noted.  Atelectasis and/or infiltrates in the lung bases. Bilateral pleural effusions. Cardiomegaly. Coronary artery disease. Midline abdominal scar noted. Fluid noted in the region of the scar with associated small amount of air. Developing abscess should be considered. These findings have progressed slightly from prior study. Multiple anterior abdominal wall subcutaneous nodules most likely injection granulomas.  IMPRESSION: 1. Findings consistent with persistent phlegmon in the liver. 2. Interim removal of right upper quadrant drainage catheter. 3. Colonic distention with air-fluid levels. These findings have improved from prior study suggesting improving adynamic ileus or diarrheal illness. Followup abdominal series suggested. 4. Slight progression of soft tissue fluid in the anterior abdomen in the region the patient's scarring. This could represent developing phlegmon/abscess. Small amount of air is noted within this fluid collection. 5. 13 mm low-density lesion left kidney, not definite simple cyst. As noted on prior study nonenhanced and enhanced MRI of the kidneys suggested for further evaluation.   Electronically Signed   By: Tim Short  Register   On: 03/28/2013 23:29   Dg Chest Port 1 View  04/19/2013   CLINICAL DATA:  Chronic respiratory failure and worsening shortness of Breath  EXAM: PORTABLE CHEST - 1 VIEW  COMPARISON:  04/10/2013  FINDINGS: A left-sided PICC line, tracheostomy tube and postsurgical changes are again seen and stable. The cardiac shadow is stable. Left-sided pleural effusion is again  identified as well as left retrocardiac atelectasis. No focal chondral no other focal infiltrate is seen.  IMPRESSION: Stable changes in the left base.  No new focal abnormality is noted.   Electronically Signed   By: Tim Short M.D.   On: 04/19/2013 12:27   Dg Chest Port 1 View  04/10/2013   CLINICAL DATA:  Cough and congestion.  EXAM: PORTABLE CHEST - 1 VIEW  COMPARISON:  DG CHEST 1V PORT dated 04/03/2013  FINDINGS: Tracheostomy and left-sided PICC line positioning are stable. The right jugular central line has been removed since the prior chest x-ray. Lungs show lower volumes bilaterally with increased prominence of bilateral lower lobe atelectasis. There may be a component of left-sided pleural fluid. The heart remains moderately enlarged with evidence of prior placement of a left atrial appendage clip. No overt pulmonary edema is identified.  IMPRESSION: Lower volumes with increased prominence of bilateral lower lobe atelectasis. There may be a component of left-sided pleural fluid.   Electronically Signed   By: Tim Short M.D.   On: 04/10/2013 11:29   Dg Chest Port 1 View  04/03/2013   CLINICAL DATA:  Central venous catheter placement  EXAM: PORTABLE CHEST - 1 VIEW  COMPARISON:  DG CHEST 1V PORT dated 04/01/2013  FINDINGS: Left upper extremity PICC is been placed. Tip is in the lower SVC. Tracheostomy tube and left atrial appendage clip stable. Left pleural effusion and basilar consolidation stable. Vascular congestion increased.  IMPRESSION: Left PICC placed with its tip  at the lower SVC.  Increased vascular congestion.  Stable left basilar consolidation and left pleural effusion.   Electronically Signed   By: Maryclare BeanArt  Hoss M.D.   On: 04/03/2013 09:04   Dg Chest Port 1 View  04/01/2013   CLINICAL DATA:  Leukocytosis  EXAM: PORTABLE CHEST - 1 VIEW  COMPARISON:  03/28/2013  FINDINGS: Postsurgical changes are again seen. A right-sided jugular central line is noted in the right innominate vein stable  from the prior exam. The cardiac shadow is stable. Left basilar consolidation with increasing effusion is noted.  IMPRESSION: Increasing left basilar infiltrate and effusion.   Electronically Signed   By: Tim CleverMark  Lukens M.D.   On: 04/01/2013 09:10   Dg Abd 2 Views  03/30/2013   CLINICAL DATA:  Colonic distention.  Multiple abdominal surgeries.  EXAM: ABDOMEN - 2 VIEW  COMPARISON:  DG ABD 2 VIEWS dated 03/29/2013; CT ABD/PELVIS W CM dated 03/28/2013  FINDINGS: Trace blunting of the right costophrenic angle. No free intraperitoneal gas.  Dilated loops of left upper quadrant small bowel measure up to 4 cm, similar to the prior exam. There are several air-fluid levels within these small bowel loops. Reduced colonic gas compared to the prior exam. IVC filter noted.  IMPRESSION: 1. Mildly dilated loops of left upper quadrant small bowel with scattered internal air-fluid levels, similar to the prior exam, with abnormal but nonspecific pattern which could be due to could ileus or partial small bowel obstruction. Correlate with bowel sounds and bowel function.   Electronically Signed   By: Tim BaltimoreWalt  Liebkemann M.D.   On: 03/30/2013 10:43   Dg Abd 2 Views  03/29/2013   CLINICAL DATA:  Lower abdominal pain. Nausea. Shortness of breath. Weakness.  EXAM: ABDOMEN - 2 VIEW  COMPARISON:  Abdominal radiograph 03/28/2013.  FINDINGS: Some colonic gas is noted. However, there are multiple borderline dilated of mildly dilated loops of gas-filled small bowel measuring up to 4.2 cm in diameter throughout the central abdomen and left side of the abdomen. No pneumoperitoneum appreciated on the left lateral decubitus view. Several small air-fluid levels are noted. Residual iodinated contrast material is noted within the lumen of the urinary bladder related to yesterday's CT examination. Numerous surgical clips are noted in the right upper quadrant of the abdomen. IVC filter projecting over either the right side at L2-L3.  IMPRESSION: 1.  Nonspecific bowel gas pattern, as above, suggestive of partial small bowel obstruction. 2. No pneumoperitoneum.   Electronically Signed   By: Trudie Reedaniel  Entrikin M.D.   On: 03/29/2013 13:38   Dg Abd Acute W/chest  03/28/2013   CLINICAL DATA:  Evaluate for a bowel obstruction.  EXAM: ACUTE ABDOMEN SERIES (ABDOMEN 2 VIEW & CHEST 1 VIEW)  COMPARISON:  None.  FINDINGS: Tracheostomy tube tip is above the carina. Heart size is mildly enlarged. The lung volumes are low. There is asymmetric elevation of the left hemidiaphragm. Left pleural effusion is noted.  Patient has an IVC filter. There are persistent abnormally dilated loops of small bowel which measure up to 5.5 cm. Gas is noted within the colon and rectum. There is no evidence of dilated bowel loops or free intraperitoneal air. No radiopaque calculi or other significant radiographic abnormality is seen. Heart size and mediastinal contours are within normal limits. Both lungs are clear.  IMPRESSION: 1. Persistent small bowel dilatation compatible with either partial small bowel obstruction or ileus. 2. No change in aeration to the lungs compared with previous exam   Electronically Signed  By: Signa Kell M.D.   On: 03/28/2013 18:56   Dg Abd Portable 1v  04/19/2013   CLINICAL DATA:  Evaluate barium in bowel  EXAM: PORTABLE ABDOMEN - 1 VIEW  COMPARISON:  04/07/2013  FINDINGS: Oral contrast is seen in the gastric fundus. The remainder of the stomach does not show significant contrast. There is no significant barium in the larg or small bowel. Negative for bowel obstruction.  IVC filter unchanged at the L2-3 level.  IMPRESSION: Barium is localized to the gastric fundus. Negative for bowel obstruction.   Electronically Signed   By: Marlan Palau M.D.   On: 04/19/2013 08:05   Dg Abd Portable 1v  04/07/2013   CLINICAL DATA:  Abdominal pain.  EXAM: PORTABLE ABDOMEN - 1 VIEW  COMPARISON:  None.  FINDINGS: The bowel gas pattern is normal. IVC filter seen at the level  of L3. Surgical clips from prior cholecystectomy noted. No radio-opaque calculi or other significant radiographic abnormality are seen.  IMPRESSION: No acute findings.  Unremarkable bowel gas pattern.   Electronically Signed   By: Myles Rosenthal M.D.   On: 04/07/2013 15:26   Dg Abd Portable 1v  04/04/2013   CLINICAL DATA:  Evaluate barium  EXAM: PORTABLE ABDOMEN - 1 VIEW  COMPARISON:  03/30/2013  FINDINGS: Contrast material is noted within the transverse colon and splenic flexure of the colon. Small bowel loop distention has improved. Slight prominence of left abdominal small bowel loops persists. No free air. No organomegaly. Prior cholecystectomy.  IMPRESSION: Improving small bowel distention. Small amount of oral contrast material within the transverse colon and splenic flexure of the colon.   Electronically Signed   By: Charlett Nose M.D.   On: 04/04/2013 10:18   Dg Swallowing Func-speech Pathology  04/11/2013   Riley Nearing Deblois, CCC-SLP     04/11/2013  3:10 PM Objective Swallowing Evaluation: Modified Barium Swallowing Study   Patient Details  Name: Tim Short MRN: [id number] Date of Birth: January 10, 1935  Today's Date: 04/11/2013 Time: 4098-1191 SLP Time Calculation (min): 25 min  Past Medical History:  Past Medical History  Diagnosis Date  . Hypertension   . Hypercholesteremia   . Asthma   . Meniere disease   . Persistent atrial fibrillation     a. s/p multiple dccv's;  b. failed amio;  c. not felt to be AF  RFCA canddiate due to LA dil;  d. s/p failed hybrid ablation @  [facility] in 09/2012;  e. chronic pradaxa.  . Obesity   . Biatrial enlargement     LA size 5.3cm  . Obstructive sleep apnea     AHI 108/hr now on CPAP at 12cm H2O  . H/O hiatal hernia   . GERD (gastroesophageal reflux disease)     "associated w/hiatal hernia" (June 15, 2012)  . Peptic ulcer 1950's  . Migraines     "haven't had one for about 15 years" (06-15-2012)  . Arthritis     "joints" (15-Jun-2012)  . Chronic lower back pain   . Nephrolithiasis ~  1960's    "passed on their own" (2012-06-15)  . History of pneumonia 2002; 2006    "spent 8 days in isolation; Norovirus" (06/15/2012)  . Other and unspecified angina pectoris   . RLS (restless legs syndrome)   . Coronary artery disease     a. 05/2012 Cath/PCI: LM nl, LAD nl, LCX 29m (3.0x15 Integrity  BMS), PTCA of OM1 through stent struts (kissing balloon).  . Diabetes mellitus without complication  FAMILY STATES PATIENT IS NOT DIABETIC   Past Surgical History:  Past Surgical History  Procedure Laterality Date  . Wrist fracture surgery  1992    "repaired w/left hip bone graft" (06/04/2012)  . Total knee arthroplasty  08/19/1999  . Colonoscopy w/ polypectomy  2006  . Knee arthroscopy with meniscal repair Right 1974    "medial meniscus repaired" (06/04/2012)  . Cardioversion  10/02/2011    Procedure: CARDIOVERSION;  Surgeon: Corky Crafts, MD;   Location: [facility] ENDOSCOPY;  Service: Cardiovascular;  Laterality:  N/A;  h/p in file drawer  . Cardioversion  11/07/2011    Procedure: CARDIOVERSION;  Surgeon: Corky Crafts, MD;   Location: [facility] ENDOSCOPY;  Service: Cardiovascular;  Laterality:  N/A;  h/p from 10/22 in file drawer/dl  . Cardiac catheterization  05/26/2012  . Coronary angioplasty with stent placement  06/04/2012    "1" (06/04/2012)  . Cataract extraction w/ intraocular lens  implant, bilateral   2009  . Hemorrhoid surgery  ~ 2003  . Hiatal hernia repair  1983  . Nissen fundoplication  1983  . Ablation of dysrhythmic focus  SEPT/OCT 2014    ATRIAL FIB  . Inguinal hernia repair Bilateral 2000 and 2005    "one done at a time" (06/04/2012)  . Colon surgery  10/08/2012   . Tracheostomy  OCT 2014   HPI:  Pt is 79 yo male with fairly recent hospitalization at [facility] in  September 2014 for failed maize procedure and during that  hospitalization pt developed volvulus and has required right  colectomy (10/08/2012), subsequently 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 Pt was in   prolonged respiratory failure at [facility], unable to wean of the vent  and requiring trach placement 10/29/2012. He was transferred to  [facility] in December 2014 and later to [facility] in late January  2015. He was on TNA and was doing fairly well initially, but  developed more abdominal pain and on 2/23 taken to OR in [place]  for gallbladder removal (secondary to cholecystitis), resection  for part of the colon for fistula, IVC filter placement. Sent  back to [facility] on march 4th, 2015 and started on TPN. Diet  advanced on March 9th, 2015 after pt passed swallow evaluation.  He was brought to [facility] March 16th, after noticing progressive  abdominal distension. In ED, CT abdomen with air- fluid level and  findings consistent with early partial SBO, ileus. Surgery  reports pt is ok for PO from their standpoint, SLP ordered for  swallow eval.      Assessment / Plan / Recommendation Clinical Impression  Dysphagia Diagnosis: Moderate pharyngeal phase dysphagia Clinical impression: Pt demonstrates significant improvement in  motor function since last MBS. Pts primary decifits are now  exclusively sensory in nature. Pt has a delay in swallow  initiation with nectar and thin teaspoon amounts with aspiration  before the swallow. With a chin tuck, the pt is albe to protect  the airway before the swallow with nectar vea teaspoon or straw.  Pt is also able to masticate soft and regular textured solids. He  was noted to have expectoration of mucous/barium after the test,  possible esophageal component not visualized due to body habitus.  Pt is recommended to upgrade to a dys 2 Finely chopped diet with  nectar thick liquids via spoon or straws with full supervison for  a chin tuck and placement of PMSV. Pt may upgrade solids if he  tolerates dys 2 diet.  Treatment Recommendation  Therapy as outlined in treatment plan below    Diet Recommendation Dysphagia 2 (Fine chop);Nectar-thick liquid   Liquid Administration via: Spoon;Straw  Medication Administration: Crushed with puree Supervision: Patient able to self feed;Full supervision/cueing  for compensatory strategies Compensations: Slow rate;Small sips/bites Postural Changes and/or Swallow Maneuvers: Chin tuck;Seated  upright 90 degrees    Other  Recommendations Oral Care Recommendations: Oral care BID Other Recommendations: Place PMSV during PO intake;Order  thickener from pharmacy;Have oral suction available   Follow Up Recommendations  Skilled Nursing facility    Frequency and Duration min 2x/week  2 weeks   Pertinent Vitals/Pain NA    SLP Swallow Goals     General HPI: Pt is 79 yo male with fairly recent hospitalization  at [facility] in September 2014 for failed maize procedure and during  that hospitalization pt developed volvulus and has required right  colectomy (10/08/2012), subsequently 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 Pt was in  prolonged respiratory failure at [facility], unable to wean of the vent  and requiring trach placement 10/29/2012. He was transferred to  [facility] in December 2014 and later to [facility] in late January  2015. He was on TNA and was doing fairly well initially, but  developed more abdominal pain and on 2/23 taken to OR in [place]  for gallbladder removal (secondary to cholecystitis), resection  for part of the colon for fistula, IVC filter placement. Sent  back to [facility] on march 4th, 2015 and started on TPN. Diet  advanced on March 9th, 2015 after pt passed swallow evaluation.  He was brought to [facility] March 16th, after noticing progressive  abdominal distension. In ED, CT abdomen with air- fluid level and  findings consistent with early partial SBO, ileus. Surgery  reports pt is ok for PO from their standpoint, SLP ordered for  swallow eval.  Type of Study: Modified Barium Swallowing Study Reason for Referral: Objectively evaluate swallowing function Previous Swallow Assessment: [facility] hospital - 3/16 pt "passed"  FEES and was  started on diet, began to decline and blue dye test  was given, pt silently aspirated thin, nectar, honey per report.  Diet Prior to this Study: Dysphagia 1 (puree);Pudding-thick  liquids Temperature Spikes Noted: No Respiratory Status: Trach collar Trach Size and Type: Cuff;Deflated;With PMSV in place;#6;Other  (Comment) History of Recent Intubation: No Behavior/Cognition: Alert;Cooperative;Requires cueing;Hard of  hearing Oral Cavity - Dentition: Missing dentition Oral Motor / Sensory Function: Impaired - see Bedside swallow  eval Self-Feeding Abilities: Able to feed self;Needs assist Patient Positioning: Upright in chair Baseline Vocal Quality: Low vocal intensity Volitional Cough: Strong Volitional Swallow: Able to elicit Anatomy: Within functional limits Pharyngeal Secretions: Not observed secondary MBS    Reason for Referral Objectively evaluate swallowing function   Oral Phase Oral Preparation/Oral Phase Oral Phase: Impaired Oral Phase - Comment Oral Phase - Comment: missing dentition, but places boluse  effectively between remaining teeth. otherwise WFL.    Pharyngeal Phase Pharyngeal Phase Pharyngeal Phase: Impaired Pharyngeal - Honey Pharyngeal - Honey Teaspoon: Not tested Pharyngeal - Honey Cup: Not tested Pharyngeal - Nectar Pharyngeal - Nectar Teaspoon: Delayed swallow  initiation;Penetration/Aspiration before swallow (chin tuck  increases airway closure, preents penetration) Penetration/Aspiration details (nectar teaspoon): Material enters  airway, passes BELOW cords without attempt by patient to eject  out (silent aspiration);Material does not enter airway Pharyngeal - Nectar Straw: Delayed swallow initiation (chin tuck) Pharyngeal - Thin Pharyngeal - Thin Teaspoon: Delayed swallow  initiation;Penetration/Aspiration before swallow (chin tuck)  Penetration/Aspiration details (thin teaspoon): Material enters  airway, CONTACTS cords and not ejected out Pharyngeal - Solids Pharyngeal - Puree: Delayed  swallow initiation (chin tuck) Pharyngeal - Mechanical Soft: Delayed swallow initiation Pharyngeal - Regular: Delayed swallow initiation  Cervical Esophageal Phase    GO             Harlon Ditty, MA CCC-SLP [phone]  Claudine Mouton 04/11/2013, 3:08 PM    Dg Swallowing Func-speech Pathology  04/02/2013   Breck Coons [place], CCC-SLP     04/02/2013  2:03 PM Objective Swallowing Evaluation: Modified Barium Swallowing Study   Patient Details  Name: Tim Short MRN: [id number] Date of Birth: [birth year]  Today's Date: 04/02/2013 Time: 1225-1250 SLP Time Calculation (min): 25 min  Past Medical History:  Past Medical History  Diagnosis Date  . Hypertension   . Hypercholesteremia   . Asthma   . Meniere disease   . Persistent atrial fibrillation     a. s/p multiple dccv's;  b. failed amio;  c. not felt to be AF  RFCA canddiate due to LA dil;  d. s/p failed hybrid ablation @  [facility] in 09/2012;  e. chronic pradaxa.  . Obesity   . Biatrial enlargement     LA size 5.3cm  . Obstructive sleep apnea     AHI 108/hr now on CPAP at 12cm H2O  . H/O hiatal hernia   . GERD (gastroesophageal reflux disease)     "associated w/hiatal hernia" (Jun 30, 2012)  . Peptic ulcer 1950's  . Migraines     "haven't had one for about 15 years" (06/30/2012)  . Arthritis     "joints" (06/30/2012)  . Chronic lower back pain   . Nephrolithiasis ~ 1960's    "passed on their own" (06-30-12)  . History of pneumonia 2002; 2006    "spent 8 days in isolation; Norovirus" (06-30-12)  . Other and unspecified angina pectoris   . RLS (restless legs syndrome)   . Coronary artery disease     a. 05/2012 Cath/PCI: LM nl, LAD nl, LCX 57m (3.0x15 Integrity  BMS), PTCA of OM1 through stent struts (kissing balloon).  . Diabetes mellitus without complication     FAMILY STATES PATIENT IS NOT DIABETIC   Past Surgical History:  Past Surgical History  Procedure Laterality Date  . Wrist fracture surgery  1992    "repaired w/left hip bone graft" (30-Jun-2012)  . Total knee  arthroplasty  08/19/1999  . Colonoscopy w/ polypectomy  2006  . Knee arthroscopy with meniscal repair Right 1974    "medial meniscus repaired" (06/30/2012)  . Cardioversion  10/02/2011    Procedure: CARDIOVERSION;  Surgeon: Corky Crafts, MD;   Location: [facility] ENDOSCOPY;  Service: Cardiovascular;  Laterality:  N/A;  h/p in file drawer  . Cardioversion  11/07/2011    Procedure: CARDIOVERSION;  Surgeon: Corky Crafts, MD;   Location: [facility] ENDOSCOPY;  Service: Cardiovascular;  Laterality:  N/A;  h/p from 10/22 in file drawer/dl  . Cardiac catheterization  05/26/2012  . Coronary angioplasty with stent placement  2012/06/30    "1" (06-30-2012)  . Cataract extraction w/ intraocular lens  implant, bilateral   2009  . Hemorrhoid surgery  ~ 2003  . Hiatal hernia repair  1983  . Nissen fundoplication  1983  . Ablation of dysrhythmic focus  SEPT/OCT 2014    ATRIAL FIB  . Inguinal hernia repair Bilateral 2000 and 2005    "one done at a time" (30-Jun-2012)  . Colon surgery  10/08/2012   .  Tracheostomy  OCT 2014   HPI:  Pt is 79 yo male with fairly recent hospitalization at [facility] in  September 2014 for failed maize procedure and during that  hospitalization pt developed volvulus and has required right  colectomy (10/08/2012), subsequently 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 Pt was in  prolonged respiratory failure at [facility], unable to wean of the vent  and requiring trach placement 10/29/2012. He was transferred to  [facility] in December 2014 and later to [facility] in late January  2015. He was on TNA and was doing fairly well initially, but  developed more abdominal pain and on 2/23 taken to OR in [place]  for gallbladder removal (secondary to cholecystitis), resection  for part of the colon for fistula, IVC filter placement. Sent  back to [facility] on march 4th, 2015 and started on TPN. Diet  advanced on March 9th, 2015 after pt passed swallow evaluation.  He was brought to [facility] March 16th, after  noticing progressive  abdominal distension. In ED, CT abdomen with air- fluid level and  findings consistent with early partial SBO, ileus. Surgery  reports pt is ok for PO from their standpoint, SLP ordered for  swallow eval.      Assessment / Plan / Recommendation Clinical Impression  Dysphagia Diagnosis: Moderate pharyngeal phase dysphagia;Severe  pharyngeal phase dysphagia Clinical impression: MBS completed with pt's PMSV donned and  appears to be congruent with study performed at [facility].  He  demonstrated moderate-severe motor based pharyngeal dyspahgia as  evidenced by reduced tongue base retraction, decreased laryngeal  elevation and decreased pharyngeal contraction.  Impairments led  to mild pharyngeal residue in vallecule/pyriform sinuses and  posterior pharyngeal wall and laryngeal vestibule consistently  silent penetration to vocal cords.  Verbal cues for hard cough  briefly cleared vestibule with subsequent penetration from  residue.  SLP recommends Dys 1 (puree) only, no liquids (pudding  thick liquid) with speaking valve donned, two swallows,  volitional coughs, pills crushed in applesauce and full  supervision/assist.       Treatment Recommendation  Therapy as outlined in treatment plan below    Diet Recommendation Dysphagia 1 (Puree);Pudding-thick liquid   Medication Administration: Crushed with puree Supervision: Patient able to self feed;Full supervision/cueing  for compensatory strategies Compensations: Slow rate;Small sips/bites;Multiple dry swallows  after each bite/sip;Hard cough after swallow Postural Changes and/or Swallow Maneuvers: Seated upright 90  degrees;Upright 30-60 min after meal, wear speaking valve during  all po's    Other  Recommendations Oral Care Recommendations: Oral care BID Other Recommendations: Order thickener from pharmacy   Follow Up Recommendations  Skilled Nursing facility    Frequency and Duration min 2x/week  2 weeks   Pertinent Vitals/Pain WDL            Reason for  Referral Objectively evaluate swallowing function   Oral Phase Oral Preparation/Oral Phase Oral Phase: WFL (WFL for textures assessed)   Pharyngeal Phase Pharyngeal Phase Pharyngeal Phase: Impaired Pharyngeal - Honey Pharyngeal - Honey Teaspoon: Reduced pharyngeal  peristalsis;Pharyngeal residue - valleculae;Reduced tongue base  retraction;Penetration/Aspiration during swallow;Pharyngeal  residue - pyriform sinuses;Reduced laryngeal elevation Penetration/Aspiration details (honey teaspoon): Material enters  airway, CONTACTS cords and not ejected out Pharyngeal - Honey Cup: Penetration/Aspiration during  swallow;Pharyngeal residue - valleculae;Pharyngeal residue -  pyriform sinuses;Reduced tongue base retraction;Reduced laryngeal  elevation Penetration/Aspiration details (honey cup): Material enters  airway, remains ABOVE vocal cords then ejected out Pharyngeal - Solids Pharyngeal - Puree: Pharyngeal residue - valleculae;Reduced  tongue  base retraction;Pharyngeal residue - pyriform  sinuses;Reduced laryngeal elevation  Cervical Esophageal Phase        Cervical Esophageal Phase Cervical Esophageal Phase: [facility]         Tim Short.Ed CCC-SLP Pager [phone]  04/02/2013         Subjective: Patient complains of his chronic abdominal pain which is not worse than usual. Last bowel movement today. No vomiting. He is slightly more short of breath today. He denies any headache, chest pain, fevers, chills.  Objective: Filed Vitals:   04/20/13 0453 04/20/13 0630 04/20/13 0640 04/20/13 0730  BP: 125/81     Pulse: 91   91  Temp: 97.8 F (36.6 C)     TempSrc: Oral     Resp: 20 28  26   Height:      Weight: 92.534 kg (204 lb)     SpO2: 94% 83% 88% 87%    Intake/Output Summary (Last 24 hours) at 04/20/13 0829 Last data filed at 04/20/13 0600  Gross per 24 hour  Intake 1517.5 ml  Output   1204 ml  Net  313.5 ml   Weight change: 0 kg (0 lb) Exam:   General:  Pt is alert, follows commands  appropriately, not in acute distress  HEENT: No icterus, No thrush, [place]/AT  Cardiovascular: RRR, S1/S2, no rubs, no gallops  Respiratory: Scattered bilateral rhonchi.  Abdomen: Soft/+BS, non tender, non distended, no guarding  Extremities: trace LE edema, No lymphangitis, No petechiae, No rashes, no synovitis  Data Reviewed: Basic Metabolic Panel:  Recent Labs Lab 04/14/13 0530 04/15/13 0530 04/16/13 0512 04/17/13 1120 04/18/13 0622 04/19/13 0620 04/20/13 0353  NA 148* 144 145 142 142 141 140  K 3.6* 3.3* 3.6* 4.0 4.1 4.4 5.1  CL 109 105 106 104 104 103 102  CO2 28 27 28 30 26 25 28   GLUCOSE 168* 158* 171* 182* 166* 166* 212*  BUN 19 16 16 17 19 20  24*  CREATININE 0.57 0.59 0.62 0.66 0.74 0.65 0.75  CALCIUM 8.6 8.3* 8.4 8.3* 8.5 8.6 8.9  MG 2.1 2.0 2.0  --  2.1  --   --   PHOS 3.5 3.5 3.5  --  3.8  --   --    Liver Function Tests:  Recent Labs Lab 04/14/13 0530 04/16/13 0512 04/18/13 0622  AST 79* 56* 33  ALT 69* 69* 49  ALKPHOS 76 80 74  BILITOT 0.3 0.3 <0.2*  PROT 5.0* 4.9* 5.1*  ALBUMIN 2.2* 2.1* 2.2*   No results found for this basename: LIPASE, AMYLASE,  in the last 168 hours No results found for this basename: AMMONIA,  in the last 168 hours CBC:  Recent Labs Lab 04/15/13 0530 04/16/13 0900 04/17/13 1120 04/18/13 0622 04/19/13 0620 04/20/13 0353  WBC 7.5 8.8 8.4 8.6 8.7 11.2*  NEUTROABS 5.5  --   --  6.4  --   --   HGB 8.6* 8.3* 8.0* 8.1* 7.7* 8.2*  HCT 28.0* 26.4* 26.3* 26.4* 24.6* 26.4*  MCV 96.9 96.0 96.3 96.0 95.0 96.7  PLT 108* 109* 110* 121* 117* 149*   Cardiac Enzymes: No results found for this basename: CKTOTAL, CKMB, CKMBINDEX, TROPONINI,  in the last 168 hours BNP: No components found with this basename: POCBNP,  CBG:  Recent Labs Lab 04/19/13 0605 04/19/13 1115 04/19/13 1604 04/19/13 2101 04/20/13 0600  GLUCAP 154* 166* 193* 166* 218*    Recent Results (from the past 240 hour(s))  CLOSTRIDIUM DIFFICILE BY PCR  Status: None   Collection Time    04/10/13  9:03 AM      Result Value Ref Range Status   C difficile by pcr NEGATIVE  NEGATIVE Final  URINE CULTURE     Status: None   Collection Time    04/13/13  2:31 PM      Result Value Ref Range Status   Specimen Description URINE, RANDOM   Final   Special Requests NONE   Final   Culture  Setup Time     Final   Value: 04/13/2013 18:52     Performed at [facility] Count     Final   Value: 8,000 COLONIES/ML     Performed at [facility]   Culture     Final   Value: INSIGNIFICANT GROWTH     Performed at [facility]   Report Status 04/14/2013 FINAL   Final  CLOSTRIDIUM DIFFICILE BY PCR     Status: Abnormal   Collection Time    04/16/13  4:44 PM      Result Value Ref Range Status   C difficile by pcr POSITIVE (*) NEGATIVE Final   Comment: CRITICAL RESULT CALLED TO, READ BACK BY AND VERIFIED WITH:     M.CROSSON,RN 1806 04/16/13 M.CAMPBELL     Scheduled Meds: . amiodarone  200 mg Oral BID  . atorvastatin  40 mg Oral Daily  . chlorhexidine  15 mL Mouth/Throat BID  . diltiazem  120 mg Oral TID WC & HS  . furosemide  40 mg Intravenous Once  . furosemide  40 mg Intravenous BID  . hydrocortisone cream   Topical BID  . insulin aspart  0-9 Units Subcutaneous TID WC  . metoprolol tartrate  12.5 mg Oral BID  . metroNIDAZOLE  500 mg Oral 3 times per day  . pantoprazole  40 mg Oral Daily  . predniSONE  5 mg Oral Q breakfast  . roflumilast  500 mcg Oral Daily  . sodium chloride  10-40 mL Intracatheter Q12H   Continuous Infusions: . sodium chloride    . Marland KitchenTPN (CLINIMIX-E) Adult 60 mL/hr at 04/19/13 1747   And  . fat emulsion 250 mL (04/19/13 2000)  . heparin Stopped (04/20/13 0627)     Tim Bennett, DO  [facility] Pager [phone]  If 7PM-7AM, please contact night-coverage [email or web address] Password [email or web address] 04/20/2013, 8:29 AM   LOS: 23 days

## 2013-04-20 NOTE — Progress Notes (Signed)
Called by RN to assist with transfer to 2H.  Patient on monitor, oxygen at 55% via trach collar, Sats 83%.  Sats decreased during transport to 67%, switched to ambu bag.  Patient tolerated transport well.

## 2013-04-21 ENCOUNTER — Inpatient Hospital Stay (HOSPITAL_COMMUNITY): Payer: Medicare HMO

## 2013-04-21 DIAGNOSIS — R4182 Altered mental status, unspecified: Secondary | ICD-10-CM

## 2013-04-21 DIAGNOSIS — J9600 Acute respiratory failure, unspecified whether with hypoxia or hypercapnia: Secondary | ICD-10-CM

## 2013-04-21 LAB — GLUCOSE, CAPILLARY
GLUCOSE-CAPILLARY: 165 mg/dL — AB (ref 70–99)
GLUCOSE-CAPILLARY: 169 mg/dL — AB (ref 70–99)
Glucose-Capillary: 115 mg/dL — ABNORMAL HIGH (ref 70–99)
Glucose-Capillary: 127 mg/dL — ABNORMAL HIGH (ref 70–99)
Glucose-Capillary: 153 mg/dL — ABNORMAL HIGH (ref 70–99)
Glucose-Capillary: 171 mg/dL — ABNORMAL HIGH (ref 70–99)
Glucose-Capillary: 173 mg/dL — ABNORMAL HIGH (ref 70–99)

## 2013-04-21 LAB — COMPREHENSIVE METABOLIC PANEL
ALT: 26 U/L (ref 0–53)
AST: 19 U/L (ref 0–37)
Albumin: 2.3 g/dL — ABNORMAL LOW (ref 3.5–5.2)
Alkaline Phosphatase: 66 U/L (ref 39–117)
BUN: 34 mg/dL — ABNORMAL HIGH (ref 6–23)
CO2: 26 meq/L (ref 19–32)
CREATININE: 0.95 mg/dL (ref 0.50–1.35)
Calcium: 8.7 mg/dL (ref 8.4–10.5)
Chloride: 101 mEq/L (ref 96–112)
GFR, EST NON AFRICAN AMERICAN: 78 mL/min — AB (ref >90)
Glucose, Bld: 119 mg/dL — ABNORMAL HIGH (ref 70–99)
Potassium: 3.5 mEq/L — ABNORMAL LOW (ref 3.7–5.3)
Sodium: 140 mEq/L (ref 137–147)
TOTAL PROTEIN: 5.1 g/dL — AB (ref 6.0–8.3)
Total Bilirubin: 0.3 mg/dL (ref 0.3–1.2)

## 2013-04-21 LAB — MAGNESIUM: Magnesium: 1.9 mg/dL (ref 1.5–2.5)

## 2013-04-21 LAB — CBC
HEMATOCRIT: 23.4 % — AB (ref 39.0–52.0)
Hemoglobin: 7.4 g/dL — ABNORMAL LOW (ref 13.0–17.0)
MCH: 29.7 pg (ref 26.0–34.0)
MCHC: 31.6 g/dL (ref 30.0–36.0)
MCV: 94 fL (ref 78.0–100.0)
Platelets: 151 10*3/uL (ref 150–400)
RBC: 2.49 MIL/uL — AB (ref 4.22–5.81)
RDW: 19.5 % — ABNORMAL HIGH (ref 11.5–15.5)
WBC: 10.8 10*3/uL — AB (ref 4.0–10.5)

## 2013-04-21 LAB — HEPARIN LEVEL (UNFRACTIONATED): Heparin Unfractionated: 0.17 IU/mL — ABNORMAL LOW (ref 0.30–0.70)

## 2013-04-21 LAB — PHOSPHORUS: PHOSPHORUS: 2.4 mg/dL (ref 2.3–4.6)

## 2013-04-21 MED ORDER — SUCCINYLCHOLINE CHLORIDE 20 MG/ML IJ SOLN
INTRAMUSCULAR | Status: AC
  Filled 2013-04-21: qty 1

## 2013-04-21 MED ORDER — ROCURONIUM BROMIDE 50 MG/5ML IV SOLN
INTRAVENOUS | Status: AC
  Filled 2013-04-21: qty 2

## 2013-04-21 MED ORDER — MIDAZOLAM HCL 2 MG/2ML IJ SOLN
1.0000 mg | INTRAMUSCULAR | Status: DC | PRN
  Administered 2013-04-21: 1 mg via INTRAVENOUS
  Filled 2013-04-21 (×2): qty 2

## 2013-04-21 MED ORDER — LIDOCAINE HCL (CARDIAC) 20 MG/ML IV SOLN
INTRAVENOUS | Status: AC
  Filled 2013-04-21: qty 5

## 2013-04-21 MED ORDER — ETOMIDATE 2 MG/ML IV SOLN
INTRAVENOUS | Status: AC
  Filled 2013-04-21: qty 20

## 2013-04-21 MED ORDER — FUROSEMIDE 10 MG/ML IJ SOLN
40.0000 mg | Freq: Four times a day (QID) | INTRAMUSCULAR | Status: AC
  Administered 2013-04-21 – 2013-04-22 (×3): 40 mg via INTRAVENOUS
  Filled 2013-04-21 (×2): qty 4

## 2013-04-21 MED ORDER — FAT EMULSION 20 % IV EMUL
250.0000 mL | INTRAVENOUS | Status: AC
  Administered 2013-04-21: 250 mL via INTRAVENOUS
  Filled 2013-04-21: qty 250

## 2013-04-21 MED ORDER — POTASSIUM CHLORIDE 10 MEQ/50ML IV SOLN
10.0000 meq | INTRAVENOUS | Status: AC
  Administered 2013-04-21 (×4): 10 meq via INTRAVENOUS
  Filled 2013-04-21 (×4): qty 50

## 2013-04-21 MED ORDER — HEPARIN (PORCINE) IN NACL 100-0.45 UNIT/ML-% IJ SOLN
1500.0000 [IU]/h | INTRAMUSCULAR | Status: AC
  Administered 2013-04-21 (×2): 1250 [IU]/h via INTRAVENOUS
  Filled 2013-04-21: qty 250

## 2013-04-21 MED ORDER — POTASSIUM CHLORIDE 10 MEQ/50ML IV SOLN: 10.0000 meq | INTRAVENOUS | Status: AC

## 2013-04-21 MED ORDER — M.V.I. ADULT IV INJ
INTRAVENOUS | Status: AC
  Administered 2013-04-21: 18:00:00 via INTRAVENOUS
  Filled 2013-04-21: qty 2000

## 2013-04-21 MED ORDER — METRONIDAZOLE IN NACL 5-0.79 MG/ML-% IV SOLN
500.0000 mg | Freq: Three times a day (TID) | INTRAVENOUS | Status: DC
  Administered 2013-04-21 – 2013-04-27 (×17): 500 mg via INTRAVENOUS
  Filled 2013-04-21 (×19): qty 100

## 2013-04-21 MED ORDER — METOPROLOL TARTRATE 1 MG/ML IV SOLN
2.5000 mg | Freq: Four times a day (QID) | INTRAVENOUS | Status: DC
  Administered 2013-04-21 – 2013-04-26 (×13): 2.5 mg via INTRAVENOUS
  Filled 2013-04-21 (×23): qty 5

## 2013-04-21 MED ORDER — HEPARIN (PORCINE) IN NACL 100-0.45 UNIT/ML-% IJ SOLN
1250.0000 [IU]/h | INTRAMUSCULAR | Status: DC
  Administered 2013-04-21: 1250 [IU]/h via INTRAVENOUS

## 2013-04-21 NOTE — Progress Notes (Signed)
ANTICOAGULATION CONSULT NOTE -follow up Pharmacy Consult for convert xarelto to UFH for PEG placement Indication: atrial fibrillation  Allergies  Allergen Reactions  . Corticosteroids     Inhaled Corticosteroids--Hoarseness, dry mouth  . Furosemide Other (See Comments)    Ototoxicity    Patient Measurements: Height: 5' 8.9" (175 cm) Weight: 208 lb 5.4 oz (94.5 kg) IBW/kg (Calculated) : 70.47 Heparin Dosing Weight: 78 kg  Vital Signs: Temp: 98.1 F (36.7 C) (04/09 1200) Temp src: Oral (04/09 1200) BP: 132/81 mmHg (04/09 1500) Pulse Rate: 86 (04/09 1546)  Labs:  Recent Labs  04/19/13 0620 04/20/13 0353 04/21/13 0300  HGB 7.7* 8.2* 7.4*  HCT 24.6* 26.4* 23.4*  PLT 117* 149* 151  APTT 61*  --   --   LABPROT 13.7  --   --   INR 1.07  --   --   HEPARINUNFRC 0.39 0.42 0.17*  CREATININE 0.65 0.75 0.95   Estimated Creatinine Clearance: 73.8 ml/min (by C-G formula based on Cr of 0.95).  Assessment: Mr. Shirlee LatchMcLean is well known to pharmacy.  PTA he was on pradaxa for afib stroke prevention and this was changed to xarelto because he had trouble swallowing the pradaxa pills.  He was transitioned to UFH on 4/4 so he can have a PEG placed for enteral nutrition support.  He was unable to have PEG placed today due to still too much barium in stomach and we have been asked to resume his IV heparin.  He had been therapeutic on 1250 units/hr.  He has some ongoing anemia with H/H of 7.4/23.4 and a platelet count of 151K today.  No bleeding noted.  Goal of Therapy:  Heparin level 0.3-0.7 units/ml Monitor platelets by anticoagulation protocol: Yes   Plan:  1. Restart IV heparin drip at 1250 units/hr 2. Daily HL and CBC while on heparin 3. Monitor for s/s of bleeding  4. F/u transition back to Xarelto after PEG tube placement today.   Nadara MustardNita Kaysen Sefcik, PharmD., MS Clinical Pharmacist Pager:  [phone] Thank you for allowing pharmacy to be part of this patients care team.

## 2013-04-21 NOTE — Consult Note (Addendum)
PULMONARY / CRITICAL CARE MEDICINE   Name: Tim Short MRN: [id number] DOB: 11/17/1934    ADMISSION DATE:  03/28/2013 CONSULTATION DATE:  04/20/2013  REFERRING MD :  Dr. Vanessa BarbaraZamora PRIMARY SERVICE:  PCCM  CHIEF COMPLAINT:  AMS / Hypoxia   BRIEF PATIENT DESCRIPTION: 79 yo with complicated, prolonged illness since 09/2012 with respiratory failure, tracheostomy, dysphagia, cholecystitis s/p cholecystectomy, colon fistula, IVC filter placement, SBO, AF with RVR.  4/8 patient decompensated on medical floor with AMS and hypoxia.  PCCM consulted for evaluation.    SIGNIFICANT EVENTS / STUDIES:   09/2012 - [facility], developed volvulus requiring R colectomy.  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 03/07/13 - Developed abd pain, distention at [facility].  [facility] for gallbladder removal, resection of colon fistula, IVC filter placement 03/2013 - Returned back to [facility] 03/16/13, started on TPN.  Passed swallow eval 3/9 03/28/13 - Progressive abd pain / distension.  [facility] abdomen with early SBO/Ileus.  [facility] - SBO resolved.  Surgery s/o.  Developed Afib with RVR.  Cardioverted per Cardiology.  SR 3/28-3/29 with plan for d/c on 3/31 but went back into fib on 3/30 -on heparin gtt.  TPN continued.   04/20/13 - decompensated & Tx to ICU  LINES / TUBES: Trach (chronic)>>> LUE PICC 3/22>>>  CULTURES: Sputum 3/22>>> KLEBSIELLA BCx2 3/26>>>neg C-Diff 3/29>>>neg UC 4/2>>>neg C-Diff 4/4 >>> POSITIVE  ANTIBIOTICS: Flagyl (PO) 4/4>>>  SUBJECTIVE: Alert and interactive, moving all ext to command.  VITAL SIGNS: Temp:  [98.4 F (36.9 C)-100.1 F (37.8 C)] 98.4 F (36.9 C) (04/09 0729) Pulse Rate:  [25-124] 80 (04/09 0850) Resp:  [6-31] 16 (04/09  0850) BP: (88-126)/(50-89) 106/70 mmHg (04/09 0850) SpO2:  [85 %-100 %] 98 % (04/09 0800) FiO2 (%):  [40 %-50 %] 40 % (04/09 0850) Weight:  [208 lb 5.4 oz (94.5 kg)] 208 lb 5.4 oz (94.5 kg) (04/09 0500)  HEMODYNAMICS:    VENTILATOR SETTINGS: Vent Mode:  [-] PRVC FiO2 (%):  [40 %-50 %] 40 % Set Rate:  [16 bmp] 16 bmp Vt Set:  [600 mL] 600 mL PEEP:  [10 cmH20] 10 cmH20 Plateau Pressure:  [24 cmH20-29 cmH20] 25 cmH20  INTAKE / OUTPUT: Intake/Output     04/08 0701 - 04/09 0700 04/09 0701 - 04/10 0700   P.O.     I.V. (mL/kg) 492.5 (5.2)    IV Piggyback 50    TPN 1862    Total Intake(mL/kg) 2404.5 (25.4)    Urine (mL/kg/hr) 700 (0.3)    Stool     Total Output 700     Net +1704.5          Urine Occurrence 6 x    Stool Occurrence 4 x      PHYSICAL EXAMINATION: General:  Chronically ill appearing adult male in NAD on vent Neuro:  Awake, follows commands HEENT:  Mm pale, moist.  Trach midline (?portex) c/d/i Cardiovascular:  s1s2 irr irr, no m/r/g Lungs:  Mild tachypnea, lungs bilaterally with scattered rhonchi Abdomen:  Round/soft, bsx4 hypoactive Musculoskeletal:  No acute deformities Skin:  Warm/dry, trace edema in BLE  LABS:  CBC  Recent Labs Lab 04/19/13 0620 04/20/13 0353 04/21/13 0300  WBC 8.7 11.2* 10.8*  HGB 7.7* 8.2* 7.4*  HCT 24.6* 26.4* 23.4*  PLT 117* 149* 151   Coag's  Recent Labs Lab 04/16/13 1230 04/17/13 1100 04/18/13 0622 04/19/13 0620  APTT 30 72* 92* 61*  INR 1.13  --   --  1.07   BMET  Recent Labs Lab 04/19/13 0620 04/20/13 0353 04/21/13 0300  NA 141 140 140  K 4.4 5.1 3.5*  CL 103 102 101  CO2 25 28 26   BUN 20 24* 34*  CREATININE 0.65 0.75 0.95  GLUCOSE 166* 212* 119*   Electrolytes  Recent Labs Lab 04/16/13 0512  04/18/13 0622 04/19/13 0620 04/20/13 0353 04/21/13 0300  CALCIUM 8.4  < > 8.5 8.6 8.9 8.7  MG 2.0  --  2.1  --   --  1.9  PHOS 3.5  --  3.8  --   --  2.4  < > = values in this interval not  displayed. Sepsis Markers No results found for this basename: LATICACIDVEN, PROCALCITON, O2SATVEN,  in the last 168 hours ABG  Recent Labs Lab 04/20/13 0830 04/20/13 1027  PHART 7.122* 7.419  PCO2ART 92.1* 38.9  PO2ART 61.5* 60.0*   Liver Enzymes  Recent Labs Lab 04/16/13 0512 04/18/13 0622 04/21/13 0300  AST 56* 33 19  ALT 69* 49 26  ALKPHOS 80 74 66  BILITOT 0.3 <0.2* 0.3  ALBUMIN 2.1* 2.2* 2.3*   Cardiac Enzymes No results found for this basename: TROPONINI, PROBNP,  in the last 168 hours Glucose  Recent Labs Lab 04/20/13 1643 04/20/13 2024 04/21/13 0048 04/21/13 0300 04/21/13 0726 04/21/13 0804  GLUCAP 148* 178* 153* 115* 171* 173*   IMAGING:   Dg Chest Port 1 View  04/20/2013   CLINICAL DATA:  Acute respiratory failure, on ventilator. Hypertension.  EXAM: PORTABLE CHEST - 1 VIEW  COMPARISON:  DG CHEST 1V PORT dated 04/19/2013  FINDINGS: Tracheostomy is midline. Left PICC tip projects over the SVC. Left atrial appendage occlusion clip is in place. Cardiomediastinal silhouette is enlarged, stable. Biapical pleural thickening. Mild diffuse interstitial prominence and indistinctness persists. Overall aeration has improved slightly in the interval, however. Small bilateral pleural effusions. Left lower lobe collapse/ consolidation. Retained contrast in the stomach.  IMPRESSION: 1. Slight improvement in congestive heart failure. 2. Left lower lobe collapse/consolidation.   Electronically Signed   By: Leanna Battles M.D.   On: 04/20/2013 09:50   Dg Abd Portable 1v  04/21/2013   CLINICAL DATA:  Evaluate barium.  EXAM: PORTABLE ABDOMEN - 1 VIEW  COMPARISON:  DG ABD PORTABLE 1V dated 04/20/2013; DG ABD PORTABLE 1V dated 04/07/2013; CT ABD/PELVIS W CM dated 04/04/2013  FINDINGS: Small amount of residual barium noted in the stomach. Barium at now progressed to the colon to the level of the rectum. Proximal small bowel distention remains. Moderate distention of the transverse colon  noted. No free air. Cholecystectomy. No acute osseous abnormality.  IMPRESSION: Barium has now progressed to the colon and rectum. Moderate distention of proximal small bowel loops and of the transverse colon noted .   Electronically Signed   By: Maisie Fus  Register   On: 04/21/2013 07:45   Dg Abd Portable 1v  04/20/2013   CLINICAL DATA:  Evaluate retained barium  EXAM: PORTABLE ABDOMEN - 1 VIEW  COMPARISON:  Abdomen film of 04/19/2013.  FINDINGS: Barium is now scattered throughout small bowel with some contrast in large bowel as well. No bowel distention is seen.  IMPRESSION: Barium is scattered throughout primarily small bowel with some contrast within colon as well.   Electronically Signed   By:  Dwyane Dee M.D.   On: 04/20/2013 08:08   ASSESSMENT / PLAN:  PULMONARY A: Acute on chronic respiratory failure Mucus plug? Tracheostomy status  OSA  Stable L effusion COPD? No acute bronchospasm P:   - Goal pH>7.30, SpO2>92. - Continuous mechanical support. - VAP bundle. - Trend ABG/CXR. - Xopenex PRN. - D/c Daliresp. - No indication for systemic steroids. - No indication for thoracentesis. - Decrease PEEP to 10. - More aggressive diureses today.  CARDIOVASCULAR A:  AF / RVR HTN HLD CAD, s/p cath with PCI P:  - Amiodarone, Cardizem, Lopressor, Lipitor. - Lopressor for HR >110.  RENAL A:   Fluid overlaod P:   - Trend BMP. - Lasix.  GASTROINTESTINAL A:   Dysphagia GERD Pseudomembranous colitis P:   - TNA - IR not comfortable doing PEG while patient has barium in his stomach. - D/c Protonix. - Continue pepcid. - Called surgery to place PEG. - After PEG is in will start TF and hopefully get off the TNA.  HEMATOLOGIC A:   Chronic anemia Therapeutic anticoagulation for AF P:  - Trend CBC. - Heparin GTT.  INFECTIOUS A:   Pseudomembranous colitis P:   - Flagyl.  ENDOCRINE A:   DM II At risk for adrenal insufficiency ( chronic low dose Prednisone ? ) P:   -  SSI, change to q4h. - D/c Prednisone. - Hydrocortisone if hypotensive.  NEUROLOGIC A:   Acute encephalopathy P:   - D/c Xanax. - Morphine PRN.  I have personally obtained history, examined patient, evaluated and interpreted laboratory and imaging results, reviewed medical records, formulated assessment / plan and placed orders.  CRITICAL CARE:  The patient is critically ill with multiple organ systems failure and requires high complexity decision making for assessment and support, frequent evaluation and titration of therapies, application of advanced monitoring technologies and extensive interpretation of multiple databases. Critical Care Time devoted to patient care services described in this note is 35 minutes.   Alyson Reedy, MD Pulmonary and Critical Care Medicine [facility] Pager: [phone]  04/21/2013, 10:51 AM

## 2013-04-21 NOTE — Progress Notes (Signed)
Not a good candidate for PEG. Marta LamasJames O. Gae BonWyatt, III, MD, FACS [phone]--pager [phone]--office [facility]

## 2013-04-21 NOTE — Progress Notes (Signed)
Patient ID: Tim Short, male   DOB: 08/24/1934, 79 y.o.   MRN: [id number]  After reviewing patient's complicated course, multiple abdominal surgeries with fistulae and abscess formations, distorted abdominal anatomy based on most recent CT scan, and current C diff infection with Dr. Lindie SpruceWyatt we feel we cannot safely place a PEG in this patient. I discussed this with Dr. Molli KnockYacoub.    Tim CaldronMichael J. Miley Lindon, PA-C Pager: [phone] General Trauma PA Pager: [phone]

## 2013-04-21 NOTE — Progress Notes (Signed)
Patient ID: Tim BarerJames B Rappa, male   DOB: 05/14/1934, 79 y.o.   MRN: [id number]  Awaiting to be able to safely move forward with perc G tube placement  Unfortunately, pt still has barium in stomach yet today.  Orders in to lay on Rt side as much as possible Re check kub in am Restart hep now---off at [facility]am 4/10

## 2013-04-21 NOTE — Progress Notes (Signed)
NUTRITION FOLLOW UP  DOCUMENTATION CODES  Per approved criteria   -Obesity Unspecified    INTERVENTION:  TPN per pharmacy; wean with advancement of EN Once PEG is placed and ready to use, recommend initiation of Vital High Protein at 10 ml/hr, advance by 10 ml q 6 hours, to goal of 70 ml/hr daily. Goal rate will provide: 1680 kcal, 147 grams protein, 1394 ml free water. Continue Ensure Pudding po TID, will change to prn as pt often refuses RD to follow for nutrition care plan  NUTRITION DIAGNOSIS: Inadequate oral intake now related to dysphagia, poor appetite as evidenced by 0-20%, ongoing  Goal: Pt to meet >/= 90% of their estimated nutrition needs, progressing PO, currently being met with TPN.  Monitor:  TPN prescription, PO intake, weight, labs, I/O's  ASSESSMENT: 79 yo male with recent complications at [facility] after an ablation for afib in Oct 2014 he developed a volvulus after the ablation was intubated/trached; required multiple abd surgeries then developed fistula.   Has been on TPN, with NGT on/off since. Is at [facility] and sent to [facility] for complaints of abd pain. Pt was taken off TPN and NGT was removed last week. He has been eating by mouth and doing well. Last 4 days with worsening abd pain, no N/V/D.  Pt with trach but now requiring vent support. Patient is currently intubated on ventilator support MV: 9.7 L/min Temp (24hrs), Avg:99.4 F (37.4 C), Min:98.4 F (36.9 C), Max:100.1 F (37.8 C)  Propofol: none  Pt has been maintained on TPN for abdominal pain s/p surgery, but has recently been able to advance to PO diet with tolerance.  Pt Dysphagia 2 diet with nectar thick liquids currently held for vent status.  Pt has been eating very poorly and refusing most meals.  Per MD, pt to be evaluated by surgery for PEG placement.    Patient is receiving TPN with Clinimix E 5/15 @ 83 ml/hr and lipids @ 10 ml/hr. Provides 1893 kcal and 99 grams protein per day. Meets 90% minimum  estimated energy needs and 90% minimum estimated protein needs.  Discussed with PharmD.  Plan to continue TPN through placement of G-tube and advancement of TFs to goal.   Pt with slow movement of barium through GI tract, but is having bowel movement.  KUB this AM showed transition of barium from stomach to bowel. Second KUB planned for this afternoon.     Height: Ht Readings from Last 1 Encounters:  03/30/13 5' 8.9" (1.75 m)    Weight -----> trending up, but overall stable Wt Readings from Last 1 Encounters:  04/21/13 208 lb 5.4 oz (94.5 kg)  3/30  186 lb 3/29  191 lb 3/27  194 lb 3/26  192 lb 3/24  195 lb 3/18  203 lb 3/17  203 lb  BMI:  Body mass index is 30.86 kg/(m^2). Obese Class I  Re-estimated needs: Permissive underfeeding:  1540-1750 kcal Protein: >140 gm Fluid: 2.0-2.2 L  Skin:  stage II pressure ulcer to L + R buttocks Closed R abdominal incision Open mid-lower abdominal incision  Diet Order: NPO   Intake/Output Summary (Last 24 hours) at 04/21/13 1119 Last data filed at 04/21/13 0700  Gross per 24 hour  Intake 2209.5 ml  Output    400 ml  Net 1809.5 ml   Last BM: 4/6 (diarrhea, c diff positive)  Labs:   Recent Labs Lab 04/16/13 0512  04/18/13 0622 04/19/13 0620 04/20/13 0353 04/21/13 0300  NA 145  < >  142 141 140 140  K 3.6*  < > 4.1 4.4 5.1 3.5*  CL 106  < > 104 103 102 101  CO2 28  < > _0 BUN 16  < > 19 20 24* 34*  CREATININE 0.62  < > 0.74 0.65 0.75 0.95  CALCIUM 8.4  < > 8.5 8.6 8.9 8.7  MG 2.0  --  2.1  --   --  1.9  PHOS 3.5  --  3.8  --   --  2.4  GLUCOSE 171*  < > 166* 166* 212* 119*  < > = values in this interval not displayed.  CBG (last 3)   Recent Labs  04/21/13 0300 04/21/13 0726 04/21/13 0804  GLUCAP 115* 171* 173*   Prealbumin  Date/Time Value Ref Range Status  04/18/2013  6:22 AM 18.6  17.0 - 34.0 mg/dL Final     Performed at [facility]   Triglycerides  Date/Time Value Ref Range Status   04/18/2013  6:22 AM 114  <150 mg/dL Final    Scheduled Meds: . amiodarone  200 mg Oral BID  . antiseptic oral rinse  15 mL Mouth Rinse QID  . atorvastatin  40 mg Oral Daily  . chlorhexidine  15 mL Mouth Rinse BID  . diltiazem  120 mg Oral TID WC & HS  . furosemide  40 mg Intravenous Q6H  . hydrocortisone cream   Topical BID  . insulin aspart  0-15 Units Subcutaneous 6 times per day  . metoprolol tartrate  12.5 mg Oral BID  . metroNIDAZOLE  500 mg Oral 3 times per day  . potassium chloride  10 mEq Intravenous Q1 Hr x 3  . potassium chloride  10 mEq Intravenous Q1 Hr x 4  . sodium chloride  10-40 mL Intracatheter Q12H    Continuous Infusions: . sodium chloride 10 mL/hr at 04/20/13 1200  . TPN (CLINIMIX) Adult without lytes 83 mL/hr at 04/20/13 1811   And  . fat emulsion 250 mL (04/20/13 1812)  . Marland KitchenTPN (CLINIMIX-E) Adult     And  . fat emulsion    . heparin Stopped (04/21/13 0600)    Brynda Greathouse, MS RD LDN Clinical Inpatient Dietitian Pager: [phone] Weekend/After hours pager: [phone]

## 2013-04-21 NOTE — Progress Notes (Signed)
PROGRESS NOTE  Tim BarerJames B Short ZOX:[id number] DOB: 05/11/1934 DOA: 03/28/2013 PCP:  Duane Lopeoss, Alan, MD Interim History Pt is 79 yo male with fairly recent hospitalization at [facility] in September 2014 for failed maize procedure and during that hospitalization pt developed volvulus and has required right colectomy (10/08/2012), subsequently developed multiple abscesses and enteric fistulas managed percutaneous drains. Pt was in [facility], unable to wean of the vent and requiring trach placement 10/29/2012. He was transferred to [facility] in December 2014 and later to [facility] in late January 2015. He was on TNA and was doing fairly well initially, but developed more abdominal pain and on 2/23 taken to [place] in [place] for gallbladder removal (secondary to cholecystitis), resection for part of the colon for fistula, IVC filter placement. Sent back to [facility] on 03/16/13 and started on TPN. Diet advanced on March 9th, 2015 after pt passed swallow evaluation. [facility], after noticing progressive abdominal distension. In ED, CT abdomen with air- fluid level and findings consistent with early partial SBO, ileus.  [facility] with surgical consult, his SBO slowly resolved and surgery signed off 3/27. Throughout his hospitalization patient developed Atrial fibrillation with RVR to rated of 130s-140s, stable, and cardiology was consulted. Patient had several up titration of his medications including addition of Amiodarone, and was d/c cardioverted on 3/27. He maintained sinus rhythm through 3/28-3/29 with plans for discharge on 3/31, however on 3/30 went back into A fib with RVR and cardiology has been re consulted.   Electrophysiology was consulted and the patient's Cardizem CD was increased to 240 mg twice a day. His heart rate improved from 130s to 120s. Metoprolol tartrate was started with improvement of the heart rate to 110s. Cardiology has  signed off but may need to be reconsulted. The patient was initially started on TPN with hopes that this would be temporary. However there is no significant improvement in the patient's nutritional or clinical status. As a result, IR was consulted for gastrostomy tube placement. It is hopeful that this will be placed on 04/20/2013. He was initially scheduled for 04/19/2013, but contrast transit beyond the stomach was slow.  The patient's insurance has denied LTAC transfer.  Case management is working on appealing this as well as the patient's son. In the last 24 hours, the patient has an increasing frothy tracheal secretions. Chest x-ray revealed increasing vascular congestion. The patient was started on intravenous Lasix.   I was called by RN notified of change in status. Evaluated patient at bedside, appears lethargic using accessory muscles, now requiring 80% O2, up from 40%. Has trach collar. He is able to follow simple commands for me.   Assessment/Plan: Acute on Chronic respiratory failure  -ABG showing pH of 7.12 with PCO2 of 92.1, transferred to ICU placed on vent support  -May be due to volume overload, getting lasix 40mg  IV BID. -Currently on 40% FiO2, lung exam improved -Follow up on repeat CXR  Atrial fibrillation/flutter  -s/p cardioversion 3/27, restored sinus rhythm and maintained over the weekend -04/11/13 am--back in A fib with RVR, rates in the 130s.  -Cardiology reconsulted, appreciate input.  -continue amiodarone 200mg  bid per EP  -Defer chronotropic control to cardiology--diltiazem CD increased to 240bid  -HR improving 100-110 with addition of metoprolol tartrate  -add metoprolol tartrate 12.5 mg by mouth daily with HR 100-110 -on IV heparin in preparation for gastrostomy tube placement--restart  Xarelto after PEG tube -Consult IR for gastrostomy tube placement--tentatively planned for 04/21/2013   Clostridium difficile colitis  -Flagyl started 04/16/2013  -Partly  contributing to abdominal pain  -fewer BMs, but still loose stools   Abdominal pain  - secondary to ileus, partial SBO as observed on admission as well as Clostridium difficile colitis presently  -ileus/SBO resolved, patient with bowel movements  -has a degree of abdominal pain which is stable.  - continue supportive care with analgesia, antiemetics as needed for symptom control.  -Consulted IR for gastrostomy tube placement--tentatively planned on 04/19/13  -TPN initiated 3/26 and that can be weaned off if po intake improves and pt improves clinically  -long discussion with son regarding risks and benefits of enteral feeding vs TPN--he is amenable to gastrostomy tube placement  -continue oral intake as tolerated, had another swallow evaluation 3/30, his diet was changed from Dys1 to Dys 2,   Moderate malnutrition  - secondary to acute on chronic illness as outlined above  - pt tolerating well however does not seem to meet his nutritional requirements per nutrition  - on TPN, appreciate nutrition and pharmacy consults.  -Ultimately plan to switch the patient enteral feeding   Thrombocytopenia  -Suspect myelosuppression due to acute medical illness--stable, slowly improving  -Check serum B12--558  -RBC folate--950  -Peripheral smear--no schistocytes  -Fibrinogen--355  -INR 1.14   Leukocytosis  - likely secondary to ileus, HCAP as suggestive per CXR 3/20  - CT abd/pelvis with mild leak around anastomosis but no other acute events, and surgery signed off  - leukocytosis has now resolved   Dysuria  -UA neg for pyuria   HCAP -  -Finished one week cefepime/levoflox and vancomycin 04/07/2013  -Remains afebrile without any respiratory distress   Acute on chronic diastolic CHF - monitoring I's/O's, daily weights  - weight trend: 191 >>204 -start Lasix 40mg  IV bid - repeat CXR 4/7-->increase vascular congestion  Hypokalemia - continue supplementation. Magnesium normal.   Sacral  ulcer - skin breakdown noted, closely monitor. Nursing care. No evidence of infection.   Anemia of chronic disease - no signs of active bleeding. Likely multifactorial ?medication induced, nutritional deficiencies. Closely monitor.   Essential hypertension -  -controlled   Tracheostomy status  -pt requires frequent suctioning--will benefit going to LTAC  -Has had numerous episodes of mucous plugging requiring aggressive suctioning  -concerned about ability to provide level of care he needs at [facility]  - Patient with a desaturation event 3/30 evening requiring suctioning.  -pulmonary toilet   Hypernatremia - TPN per pharmacy. Improving, supplemented 3/30 with D5W.  Family Communication: Son notified of change in patient's status and transfer to ICU request made Disposition Plan: [name] vs [facility]          Procedures/Studies: Ct Abdomen Pelvis W Contrast  04/04/2013   CLINICAL DATA:  Liver flight non.  Leukocytosis.  EXAM: CT ABDOMEN AND PELVIS WITH CONTRAST  TECHNIQUE: Multidetector CT imaging of the abdomen and pelvis was performed using the standard protocol following bolus administration of intravenous contrast.  CONTRAST:  90mL OMNIPAQUE IOHEXOL 300 MG/ML  SOLN  COMPARISON:  DG ABD PORTABLE 1V dated 04/04/2013; CT ABD/PELVIS W CM dated 03/28/2013; CT ABD-PELV W/O CM dated 03/11/2013; CT ABD/PELVIS W CM dated 01/30/2013  FINDINGS: Stable moderate left and small right pleural effusions with associated passive atelectasis. Mild cardiomegaly. Postoperative findings along the gastroesophageal junction.  Slightly reduce marginal definition of the infiltrative hypodense process involving the right hepatic lobe and portions of  segment 4 of the liver.  The spleen, adrenal glands, and pancreas unremarkable. Small amount of fluid along the gallbladder fossa and along the inferior edge of the right hepatic lobe could, similar to prior. Gallbladder surgically absent.  There is contrast medium in  the renal collecting systems on the initial portal venous phase images, likely due to inadvertent early injection.  Stable renal hypodensities. Infrarenal IVC filter. Continued fluid along the inferior margin of the laparotomy wound. Trace fluid in the lower omentum. Stable trace presacral edema.  Bilateral chronic pars defects at L5 observed with 9 mm of anterolisthesis.  Orally administered contrast extends through to the rectum. Mild circumferential rectal wall thickening.  The patient seems to have had right hemicolectomy, with reanastomosis to the small bowel on image 39 of series 5. Outpouching from the proximal terminal margin of the remaining colon shown on images 32 through 21 of series 5, potentially some type of postoperative diverticulum or contained leak along the stable site.  IMPRESSION: 1. Similar distribution but reduced conspicuity of the peripheral infiltrative hepatic hypodensity affecting the right hepatic lobe an adjacent portion of segment 4. Fatty infiltration is the most common cause for and infiltrative hypodensity of this type, although localized parenchymal inflammation is not entirely excluded. MRI could differentiate if clinically warranted. 2. Small amount of perihepatic ascites, similar to prior. 3. Hypodense renal lesions, similar to prior. 4. Patient appears to have had right hemicolectomy with small bowel reanastomosis. There is a collection of gas and contrast extending to the right of the proximal terminal margin of the remaining colon, potentially a small contained leak along the staple line. 5. Continued fluid along the inferior margin of the laparotomy wound, but without internal gas density currently.   Electronically Signed   By: Herbie Baltimore M.D.   On: 04/04/2013 18:40   Ct Abdomen Pelvis W Contrast  03/28/2013   CLINICAL DATA:  Abdominal distention.  EXAM: CT ABDOMEN AND PELVIS WITH CONTRAST  TECHNIQUE: Multidetector CT imaging of the abdomen and pelvis was  performed using the standard protocol following bolus administration of intravenous contrast.  CONTRAST:  OMNIPAQUE IOHEXOL 300 MG/ML  SOLN  COMPARISON:  DG ABD ACUTE W/CHEST dated 03/28/2013; CT ABD-PELV W/O CM dated 03/11/2013; CT ABD/PELVIS W CM dated 01/30/2013  FINDINGS: There is a new lucency is again noted in the liver. This remains unchanged in appearance and may be infectious in etiology. No clearcut fluid collection in or air collection is noted in the liver to suggest a definite abscess. These changes may be related to phlegmon. Close follow up to evaluate for developing abscess suggested. Hepatic veins patent. Splenic and portal veins patent. No focal significant splenic abnormality. Pancreas normal. Cholecystectomy. No biliary distention.  Adrenals normal. Stable approximate 13 mm low-density lesion in the left kidney, not definite simple cyst. As noted on prior study, MRI of the kidneys suggest for further evaluation. No hydronephrosis. The bladder is nondistended. No free pelvic fluid. Prostate is not enlarged. Calcifications within the prostate. No significant adenopathy. Abdominal aorta is atherosclerotic. No aneurysm. Visceral vessels are patent. Inferior vena caval filter noted in the IVC with tip just below the renal veins.  Previously identified drainage catheter in the right upper quadrant has been removed. Inflammatory changes are noted in the right mid abdomen, no abscess noted. The patient has had a prior hemicolectomy. Reference is made to CT report of 01/31/2012 which discusses fistula changes within the abdomen. Patient status post right hemicolectomy. The colon is moderately  distended and contains fluid levels. These changes have improved from prior exam suggesting improving ileus. These changes may be related to a diarrheal illness. Follow-up abdominal series suggested. No small bowel distention. The stomach is nondistended. No free air noted.  Atelectasis and/or infiltrates in the  lung bases. Bilateral pleural effusions. Cardiomegaly. Coronary artery disease. Midline abdominal scar noted. Fluid noted in the region of the scar with associated small amount of air. Developing abscess should be considered. These findings have progressed slightly from prior study. Multiple anterior abdominal wall subcutaneous nodules most likely injection granulomas.  IMPRESSION: 1. Findings consistent with persistent phlegmon in the liver. 2. Interim removal of right upper quadrant drainage catheter. 3. Colonic distention with air-fluid levels. These findings have improved from prior study suggesting improving adynamic ileus or diarrheal illness. Followup abdominal series suggested. 4. Slight progression of soft tissue fluid in the anterior abdomen in the region the patient's scarring. This could represent developing phlegmon/abscess. Small amount of air is noted within this fluid collection. 5. 13 mm low-density lesion left kidney, not definite simple cyst. As noted on prior study nonenhanced and enhanced MRI of the kidneys suggested for further evaluation.   Electronically Signed   By: Maisie Fus  Register   On: 03/28/2013 23:29   Dg Chest Port 1 View  04/19/2013   CLINICAL DATA:  Chronic respiratory failure and worsening shortness of Breath  EXAM: PORTABLE CHEST - 1 VIEW  COMPARISON:  04/10/2013  FINDINGS: A left-sided PICC line, tracheostomy tube and postsurgical changes are again seen and stable. The cardiac shadow is stable. Left-sided pleural effusion is again identified as well as left retrocardiac atelectasis. No focal chondral no other focal infiltrate is seen.  IMPRESSION: Stable changes in the left base.  No new focal abnormality is noted.   Electronically Signed   By: Alcide Clever M.D.   On: 04/19/2013 12:27   Dg Chest Port 1 View  04/10/2013   CLINICAL DATA:  Cough and congestion.  EXAM: PORTABLE CHEST - 1 VIEW  COMPARISON:  DG CHEST 1V PORT dated 04/03/2013  FINDINGS: Tracheostomy and left-sided  PICC line positioning are stable. The right jugular central line has been removed since the prior chest x-ray. Lungs show lower volumes bilaterally with increased prominence of bilateral lower lobe atelectasis. There may be a component of left-sided pleural fluid. The heart remains moderately enlarged with evidence of prior placement of a left atrial appendage clip. No overt pulmonary edema is identified.  IMPRESSION: Lower volumes with increased prominence of bilateral lower lobe atelectasis. There may be a component of left-sided pleural fluid.   Electronically Signed   By: Irish Lack M.D.   On: 04/10/2013 11:29   Dg Chest Port 1 View  04/03/2013   CLINICAL DATA:  Central venous catheter placement  EXAM: PORTABLE CHEST - 1 VIEW  COMPARISON:  DG CHEST 1V PORT dated 04/01/2013  FINDINGS: Left upper extremity PICC is been placed. Tip is in the lower SVC. Tracheostomy tube and left atrial appendage clip stable. Left pleural effusion and basilar consolidation stable. Vascular congestion increased.  IMPRESSION: Left PICC placed with its tip at the lower SVC.  Increased vascular congestion.  Stable left basilar consolidation and left pleural effusion.   Electronically Signed   By: Maryclare Bean M.D.   On: 04/03/2013 09:04   Dg Chest Port 1 View  04/01/2013   CLINICAL DATA:  Leukocytosis  EXAM: PORTABLE CHEST - 1 VIEW  COMPARISON:  03/28/2013  FINDINGS: Postsurgical changes are again seen.  A right-sided jugular central line is noted in the right innominate vein stable from the prior exam. The cardiac shadow is stable. Left basilar consolidation with increasing effusion is noted.  IMPRESSION: Increasing left basilar infiltrate and effusion.   Electronically Signed   By: Alcide Clever M.D.   On: 04/01/2013 09:10   Dg Abd 2 Views  03/30/2013   CLINICAL DATA:  Colonic distention.  Multiple abdominal surgeries.  EXAM: ABDOMEN - 2 VIEW  COMPARISON:  DG ABD 2 VIEWS dated 03/29/2013; CT ABD/PELVIS W CM dated 03/28/2013   FINDINGS: Trace blunting of the right costophrenic angle. No free intraperitoneal gas.  Dilated loops of left upper quadrant small bowel measure up to 4 cm, similar to the prior exam. There are several air-fluid levels within these small bowel loops. Reduced colonic gas compared to the prior exam. IVC filter noted.  IMPRESSION: 1. Mildly dilated loops of left upper quadrant small bowel with scattered internal air-fluid levels, similar to the prior exam, with abnormal but nonspecific pattern which could be due to could ileus or partial small bowel obstruction. Correlate with bowel sounds and bowel function.   Electronically Signed   By: Herbie Baltimore M.D.   On: 03/30/2013 10:43   Dg Abd 2 Views  03/29/2013   CLINICAL DATA:  Lower abdominal pain. Nausea. Shortness of breath. Weakness.  EXAM: ABDOMEN - 2 VIEW  COMPARISON:  Abdominal radiograph 03/28/2013.  FINDINGS: Some colonic gas is noted. However, there are multiple borderline dilated of mildly dilated loops of gas-filled small bowel measuring up to 4.2 cm in diameter throughout the central abdomen and left side of the abdomen. No pneumoperitoneum appreciated on the left lateral decubitus view. Several small air-fluid levels are noted. Residual iodinated contrast material is noted within the lumen of the urinary bladder related to yesterday's CT examination. Numerous surgical clips are noted in the right upper quadrant of the abdomen. IVC filter projecting over either the right side at L2-L3.  IMPRESSION: 1. Nonspecific bowel gas pattern, as above, suggestive of partial small bowel obstruction. 2. No pneumoperitoneum.   Electronically Signed   By: Trudie Reed M.D.   On: 03/29/2013 13:38   Dg Abd Acute W/chest  03/28/2013   CLINICAL DATA:  Evaluate for a bowel obstruction.  EXAM: ACUTE ABDOMEN SERIES (ABDOMEN 2 VIEW & CHEST 1 VIEW)  COMPARISON:  None.  FINDINGS: Tracheostomy tube tip is above the carina. Heart size is mildly enlarged. The lung volumes  are low. There is asymmetric elevation of the left hemidiaphragm. Left pleural effusion is noted.  Patient has an IVC filter. There are persistent abnormally dilated loops of small bowel which measure up to 5.5 cm. Gas is noted within the colon and rectum. There is no evidence of dilated bowel loops or free intraperitoneal air. No radiopaque calculi or other significant radiographic abnormality is seen. Heart size and mediastinal contours are within normal limits. Both lungs are clear.  IMPRESSION: 1. Persistent small bowel dilatation compatible with either partial small bowel obstruction or ileus. 2. No change in aeration to the lungs compared with previous exam   Electronically Signed   By: Signa Kell M.D.   On: 03/28/2013 18:56   Dg Abd Portable 1v  04/19/2013   CLINICAL DATA:  Evaluate barium in bowel  EXAM: PORTABLE ABDOMEN - 1 VIEW  COMPARISON:  04/07/2013  FINDINGS: Oral contrast is seen in the gastric fundus. The remainder of the stomach does not show significant contrast. There is no significant barium in  the larg or small bowel. Negative for bowel obstruction.  IVC filter unchanged at the L2-3 level.  IMPRESSION: Barium is localized to the gastric fundus. Negative for bowel obstruction.   Electronically Signed   By: Marlan Palau M.D.   On: 04/19/2013 08:05   Dg Abd Portable 1v  04/07/2013   CLINICAL DATA:  Abdominal pain.  EXAM: PORTABLE ABDOMEN - 1 VIEW  COMPARISON:  None.  FINDINGS: The bowel gas pattern is normal. IVC filter seen at the level of L3. Surgical clips from prior cholecystectomy noted. No radio-opaque calculi or other significant radiographic abnormality are seen.  IMPRESSION: No acute findings.  Unremarkable bowel gas pattern.   Electronically Signed   By: Myles Rosenthal M.D.   On: 04/07/2013 15:26   Dg Abd Portable 1v  04/04/2013   CLINICAL DATA:  Evaluate barium  EXAM: PORTABLE ABDOMEN - 1 VIEW  COMPARISON:  03/30/2013  FINDINGS: Contrast material is noted within the transverse  colon and splenic flexure of the colon. Small bowel loop distention has improved. Slight prominence of left abdominal small bowel loops persists. No free air. No organomegaly. Prior cholecystectomy.  IMPRESSION: Improving small bowel distention. Small amount of oral contrast material within the transverse colon and splenic flexure of the colon.   Electronically Signed   By: Charlett Nose M.D.   On: 04/04/2013 10:18   Dg Swallowing Func-speech Pathology  04/11/2013   Riley Nearing Deblois, CCC-SLP     04/11/2013  3:10 PM Objective Swallowing Evaluation: Modified Barium Swallowing Study   Patient Details  Name: Tim Short MRN: [id number] Date of Birth: January 06, 1935  Today's Date: 04/11/2013 Time: 4098-1191 SLP Time Calculation (min): 25 min  Past Medical History:  Past Medical History  Diagnosis Date  . Hypertension   . Hypercholesteremia   . Asthma   . Meniere disease   . Persistent atrial fibrillation     a. s/p multiple dccv's;  b. failed amio;  c. not felt to be AF  RFCA canddiate due to LA dil;  d. s/p failed hybrid ablation @  [facility] in 09/2012;  e. chronic pradaxa.  . Obesity   . Biatrial enlargement     LA size 5.3cm  . Obstructive sleep apnea     AHI 108/hr now on CPAP at 12cm H2O  . H/O hiatal hernia   . GERD (gastroesophageal reflux disease)     "associated w/hiatal hernia" (27-Jun-2012)  . Peptic ulcer 1950's  . Migraines     "haven't had one for about 15 years" (2012-06-27)  . Arthritis     "joints" (06-27-12)  . Chronic lower back pain   . Nephrolithiasis ~ 1960's    "passed on their own" (2012-06-27)  . History of pneumonia 2002; 2006    "spent 8 days in isolation; Norovirus" (06-27-12)  . Other and unspecified angina pectoris   . RLS (restless legs syndrome)   . Coronary artery disease     a. 05/2012 Cath/PCI: LM nl, LAD nl, LCX 72m (3.0x15 Integrity  BMS), PTCA of OM1 through stent struts (kissing balloon).  . Diabetes mellitus without complication     FAMILY STATES PATIENT IS NOT DIABETIC   Past Surgical  History:  Past Surgical History  Procedure Laterality Date  . Wrist fracture surgery  1992    "repaired w/left hip bone graft" (06/27/2012)  . Total knee arthroplasty  08/19/1999  . Colonoscopy w/ polypectomy  2006  . Knee arthroscopy with meniscal repair Right 1974    "medial meniscus  repaired" (06/04/2012)  . Cardioversion  10/02/2011    Procedure: CARDIOVERSION;  Surgeon: Corky Crafts, MD;   Location[facility] ENDOSCOPY;  Service: Cardiovascular;  Laterality:  N/A;  h/p in file drawer  . Cardioversion  11/07/2011    Procedure: CARDIOVERSION;  Surgeon: Corky Crafts, MD;   Location: [facility] ENDOSCOPY;  Service: Cardiovascular;  Laterality:  N/A;  h/p from 10/22 in file drawer/dl  . Cardiac catheterization  05/26/2012  . Coronary angioplasty with stent placement  06/04/2012    "1" (06/04/2012)  . Cataract extraction w/ intraocular lens  implant, bilateral   2009  . Hemorrhoid surgery  ~ 2003  . Hiatal hernia repair  1983  . Nissen fundoplication  1983  . Ablation of dysrhythmic focus  SEPT/OCT 2014    ATRIAL FIB  . Inguinal hernia repair Bilateral 2000 and 2005    "one done at a time" (06/04/2012)  . Colon surgery  10/08/2012   . Tracheostomy  OCT 2014   HPI:  Pt is 79 yo male with fairly recent hospitalization at [facility] in  September 2014 for failed maize procedure and during that  hospitalization pt developed volvulus and has required right  colectomy (10/08/2012), subsequently 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 Pt was in  prolonged respiratory failure at [facility] unable to wean of the vent  and requiring trach placement 10/29/2012. He was transferred to  [facility] in December 2014 and later to [facility] in late January  2015. He was on TNA and was doing fairly well initially, but  developed more abdominal pain and on 2/23 taken to OR in [place]  for gallbladder removal (secondary to cholecystitis), resection  for part of the colon for fistula, IVC filter placement. Sent  back to  [facility] on march 4th, 2015 and started on TPN. Diet  advanced on March 9th, 2015 after pt passed swallow evaluation.  He was brought to [facility] March 16th, after noticing progressive  abdominal distension. In ED, CT abdomen with air- fluid level and  findings consistent with early partial SBO, ileus. Surgery  reports pt is ok for PO from their standpoint, SLP ordered for  swallow eval.      Assessment / Plan / Recommendation Clinical Impression  Dysphagia Diagnosis: Moderate pharyngeal phase dysphagia Clinical impression: Pt demonstrates significant improvement in  motor function since last MBS. Pts primary decifits are now  exclusively sensory in nature. Pt has a delay in swallow  initiation with nectar and thin teaspoon amounts with aspiration  before the swallow. With a chin tuck, the pt is albe to protect  the airway before the swallow with nectar vea teaspoon or straw.  Pt is also able to masticate soft and regular textured solids. He  was noted to have expectoration of mucous/barium after the test,  possible esophageal component not visualized due to body habitus.  Pt is recommended to upgrade to a dys 2 Finely chopped diet with  nectar thick liquids via spoon or straws with full supervison for  a chin tuck and placement of PMSV. Pt may upgrade solids if he  tolerates dys 2 diet.     Treatment Recommendation  Therapy as outlined in treatment plan below    Diet Recommendation Dysphagia 2 (Fine chop);Nectar-thick liquid   Liquid Administration via: Spoon;Straw Medication Administration: Crushed with puree Supervision: Patient able to self feed;Full supervision/cueing  for compensatory strategies Compensations: Slow rate;Small sips/bites Postural Changes and/or Swallow Maneuvers: Chin tuck;Seated  upright 90 degrees  Other  Recommendations Oral Care Recommendations: Oral care BID Other Recommendations: Place PMSV during PO intake;Order  thickener from pharmacy;Have oral suction available   Follow Up Recommendations   Skilled Nursing facility    Frequency and Duration min 2x/week  2 weeks   Pertinent Vitals/Pain NA    SLP Swallow Goals     General HPI: Pt is 80 yo male with fairly recent hospitalization  at [facility] in September 2014 for failed maize procedure and during  that hospitalization pt developed volvulus and has required right  colectomy (10/08/2012), subsequently 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 Pt was in  prolonged respiratory failure at [facility], unable to wean of the vent  and requiring trach placement 10/29/2012. He was transferred to  [facility] in December 2014 and later to [facility] in late January  2015. He was on TNA and was doing fairly well initially, but  developed more abdominal pain and on 2/23 taken to OR in [place]  for gallbladder removal (secondary to cholecystitis), resection  for part of the colon for fistula, IVC filter placement. Sent  back to [facility] on march 4th, 2015 and started on TPN. Diet  advanced on March 9th, 2015 after pt passed swallow evaluation.  He was brought to [facility] March 16th, after noticing progressive  abdominal distension. In ED, CT abdomen with air- fluid level and  findings consistent with early partial SBO, ileus. Surgery  reports pt is ok for PO from their standpoint, SLP ordered for  swallow eval.  Type of Study: Modified Barium Swallowing Study Reason for Referral: Objectively evaluate swallowing function Previous Swallow Assessment: [facility] - 3/16 pt "passed"  FEES and was started on diet, began to decline and blue dye test  was given, pt silently aspirated thin, nectar, honey per report.  Diet Prior to this Study: Dysphagia 1 (puree);Pudding-thick  liquids Temperature Spikes Noted: No Respiratory Status: Trach collar Trach Size and Type: Cuff;Deflated;With PMSV in place;#6;Other  (Comment) History of Recent Intubation: No Behavior/Cognition: Alert;Cooperative;Requires cueing;Hard of  hearing Oral Cavity - Dentition: Missing  dentition Oral Motor / Sensory Function: Impaired - see Bedside swallow  eval Self-Feeding Abilities: Able to feed self;Needs assist Patient Positioning: Upright in chair Baseline Vocal Quality: Low vocal intensity Volitional Cough: Strong Volitional Swallow: Able to elicit Anatomy: Within functional limits Pharyngeal Secretions: Not observed secondary MBS    Reason for Referral Objectively evaluate swallowing function   Oral Phase Oral Preparation/Oral Phase Oral Phase: Impaired Oral Phase - Comment Oral Phase - Comment: missing dentition, but places boluse  effectively between remaining teeth. otherwise WFL.    Pharyngeal Phase Pharyngeal Phase Pharyngeal Phase: Impaired Pharyngeal - Honey Pharyngeal - Honey Teaspoon: Not tested Pharyngeal - Honey Cup: Not tested Pharyngeal - Nectar Pharyngeal - Nectar Teaspoon: Delayed swallow  initiation;Penetration/Aspiration before swallow (chin tuck  increases airway closure, preents penetration) Penetration/Aspiration details (nectar teaspoon): Material enters  airway, passes BELOW cords without attempt by patient to eject  out (silent aspiration);Material does not enter airway Pharyngeal - Nectar Straw: Delayed swallow initiation (chin tuck) Pharyngeal - Thin Pharyngeal - Thin Teaspoon: Delayed swallow  initiation;Penetration/Aspiration before swallow (chin tuck) Penetration/Aspiration details (thin teaspoon): Material enters  airway, CONTACTS cords and not ejected out Pharyngeal - Solids Pharyngeal - Puree: Delayed swallow initiation (chin tuck) Pharyngeal - Mechanical Soft: Delayed swallow initiation Pharyngeal - Regular: Delayed swallow initiation  Cervical Esophageal Phase    GO             Harlon Ditty,  MA CCC-SLP [phone]  Claudine Mouton 04/11/2013, 3:08 PM    Dg Swallowing Func-speech Pathology  04/02/2013   Breck Coons Sandoval, CCC-SLP     04/02/2013  2:03 PM Objective Swallowing Evaluation: Modified Barium Swallowing Study   Patient Details  Name:  Tim Short MRN: [id number] Date of Birth: 05/29/1934  Today's Date: 04/02/2013 Time: 1225-1250 SLP Time Calculation (min): 25 min  Past Medical History:  Past Medical History  Diagnosis Date  . Hypertension   . Hypercholesteremia   . Asthma   . Meniere disease   . Persistent atrial fibrillation     a. s/p multiple dccv's;  b. failed amio;  c. not felt to be AF  RFCA canddiate due to LA dil;  d. s/p failed hybrid ablation @  [facility] in 09/2012;  e. chronic pradaxa.  . Obesity   . Biatrial enlargement     LA size 5.3cm  . Obstructive sleep apnea     AHI 108/hr now on CPAP at 12cm H2O  . H/O hiatal hernia   . GERD (gastroesophageal reflux disease)     "associated w/hiatal hernia" (2012/06/27)  . Peptic ulcer 1950's  . Migraines     "haven't had one for about 15 years" (2012-06-27)  . Arthritis     "joints" (27-Jun-2012)  . Chronic lower back pain   . Nephrolithiasis ~ 1960's    "passed on their own" (2012-06-27)  . History of pneumonia 2002; 2006    "spent 8 days in isolation; Norovirus" (06/27/2012)  . Other and unspecified angina pectoris   . RLS (restless legs syndrome)   . Coronary artery disease     a. 05/2012 Cath/PCI: LM nl, LAD nl, LCX 27m (3.0x15 Integrity  BMS), PTCA of OM1 through stent struts (kissing balloon).  . Diabetes mellitus without complication     FAMILY STATES PATIENT IS NOT DIABETIC   Past Surgical History:  Past Surgical History  Procedure Laterality Date  . Wrist fracture surgery  1992    "repaired w/left hip bone graft" (2012/06/27)  . Total knee arthroplasty  08/19/1999  . Colonoscopy w/ polypectomy  2006  . Knee arthroscopy with meniscal repair Right 1974    "medial meniscus repaired" (06-27-12)  . Cardioversion  10/02/2011    Procedure: CARDIOVERSION;  Surgeon: Corky Crafts, MD;   Location[facility] ENDOSCOPY;  Service: Cardiovascular;  Laterality:  N/A;  h/p in file drawer  . Cardioversion  11/07/2011    Procedure: CARDIOVERSION;  Surgeon: Corky Crafts, MD;   Location: [facility] ENDOSCOPY;  Service:  Cardiovascular;  Laterality:  N/A;  h/p from 10/22 in file drawer/dl  . Cardiac catheterization  05/26/2012  . Coronary angioplasty with stent placement  27-Jun-2012    "1" (06-27-2012)  . Cataract extraction w/ intraocular lens  implant, bilateral   2009  . Hemorrhoid surgery  ~ 2003  . Hiatal hernia repair  1983  . Nissen fundoplication  1983  . Ablation of dysrhythmic focus  SEPT/OCT 2014    ATRIAL FIB  . Inguinal hernia repair Bilateral 2000 and 2005    "one done at a time" (2012/06/27)  . Colon surgery  10/08/2012   . Tracheostomy  OCT 2014   HPI:  Pt is 79 yo male with fairly recent hospitalization at [facility] in  September 2014 for failed maize procedure and during that  hospitalization pt developed volvulus and has required right  colectomy (10/08/2012), subsequently 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 Pt was in  prolonged respiratory failure at [facility], unable to wean of the vent  and requiring trach placement 10/29/2012. He was transferred to  [facility] in December 2014 and later to [facility] in late January  2015. He was on TNA and was doing fairly well initially, but  developed more abdominal pain and on 2/23 taken to OR in [place]  for gallbladder removal (secondary to cholecystitis), resection  for part of the colon for fistula, IVC filter placement. Sent  back to [facility] on march 4th, 2015 and started on TPN. Diet  advanced on March 9th, 2015 after pt passed swallow evaluation.  He was brought to [facility] March 16th, after noticing progressive  abdominal distension. In ED, CT abdomen with air- fluid level and  findings consistent with early partial SBO, ileus. Surgery  reports pt is ok for PO from their standpoint, SLP ordered for  swallow eval.      Assessment / Plan / Recommendation Clinical Impression  Dysphagia Diagnosis: Moderate pharyngeal phase dysphagia;Severe  pharyngeal phase dysphagia Clinical impression: MBS completed with pt's PMSV donned and  appears to be congruent  with study performed at [facility].  He  demonstrated moderate-severe motor based pharyngeal dyspahgia as  evidenced by reduced tongue base retraction, decreased laryngeal  elevation and decreased pharyngeal contraction.  Impairments led  to mild pharyngeal residue in vallecule/pyriform sinuses and  posterior pharyngeal wall and laryngeal vestibule consistently  silent penetration to vocal cords.  Verbal cues for hard cough  briefly cleared vestibule with subsequent penetration from  residue.  SLP recommends Dys 1 (puree) only, no liquids (pudding  thick liquid) with speaking valve donned, two swallows,  volitional coughs, pills crushed in applesauce and full  supervision/assist.       Treatment Recommendation  Therapy as outlined in treatment plan below    Diet Recommendation Dysphagia 1 (Puree);Pudding-thick liquid   Medication Administration: Crushed with puree Supervision: Patient able to self feed;Full supervision/cueing  for compensatory strategies Compensations: Slow rate;Small sips/bites;Multiple dry swallows  after each bite/sip;Hard cough after swallow Postural Changes and/or Swallow Maneuvers: Seated upright 90  degrees;Upright 30-60 min after meal, wear speaking valve during  all po's    Other  Recommendations Oral Care Recommendations: Oral care BID Other Recommendations: Order thickener from pharmacy   Follow Up Recommendations  Skilled Nursing facility    Frequency and Duration min 2x/week  2 weeks   Pertinent Vitals/Pain WDL            Reason for Referral Objectively evaluate swallowing function   Oral Phase Oral Preparation/Oral Phase Oral Phase: WFL (WFL for textures assessed)   Pharyngeal Phase Pharyngeal Phase Pharyngeal Phase: Impaired Pharyngeal - Honey Pharyngeal - Honey Teaspoon: Reduced pharyngeal  peristalsis;Pharyngeal residue - valleculae;Reduced tongue base  retraction;Penetration/Aspiration during swallow;Pharyngeal  residue - pyriform sinuses;Reduced laryngeal elevation  Penetration/Aspiration details (honey teaspoon): Material enters  airway, CONTACTS cords and not ejected out Pharyngeal - Honey Cup: Penetration/Aspiration during  swallow;Pharyngeal residue - valleculae;Pharyngeal residue -  pyriform sinuses;Reduced tongue base retraction;Reduced laryngeal  elevation Penetration/Aspiration details (honey cup): Material enters  airway, remains ABOVE vocal cords then ejected out Pharyngeal - Solids Pharyngeal - Puree: Pharyngeal residue - valleculae;Reduced  tongue base retraction;Pharyngeal residue - pyriform  sinuses;Reduced laryngeal elevation  Cervical Esophageal Phase        Cervical Esophageal Phase Cervical Esophageal Phase: [facility]         Darrow Bussing.Ed CCC-SLP Pager [phone]  04/02/2013         Subjective: Patient complains of his  chronic abdominal pain which is not worse than usual. Last bowel movement today. No vomiting. He is slightly more short of breath today. He denies any headache, chest pain, fevers, chills.  Objective: Filed Vitals:   04/21/13 0500 04/21/13 0600 04/21/13 0700 04/21/13 0729  BP: 114/89 100/73 90/58   Pulse: 85 39 124   Temp:    98.4 F (36.9 C)  TempSrc:    Oral  Resp: 21 19 16    Height:      Weight: 94.5 kg (208 lb 5.4 oz)     SpO2: 94% 99% 100%     Intake/Output Summary (Last 24 hours) at 04/21/13 0740 Last data filed at 04/21/13 0700  Gross per 24 hour  Intake 2404.5 ml  Output    700 ml  Net 1704.5 ml   Weight change: 2.866 kg (6 lb 5.1 oz) Exam:   General:  Arousable, following commands  HEENT: No icterus, No thrush, [place]/AT  Cardiovascular: RRR, S1/S2, no rubs, no gallops  Respiratory: improved lung exam with decreased crackles compared yesterday's exam  Abdomen: Soft/+BS, non tender, non distended, no guarding  Extremities: trace LE edema, No lymphangitis, No petechiae, No rashes, no synovitis  Data Reviewed: Basic Metabolic Panel:  Recent Labs Lab 04/15/13 0530 04/16/13 0512  04/17/13 1120 04/18/13 0622 04/19/13 0620 04/20/13 0353 04/21/13 0300  NA 144 145 142 142 141 140 140  K 3.3* 3.6* 4.0 4.1 4.4 5.1 3.5*  CL 105 106 104 104 103 102 101  CO2 27 28 30 26 25 28 26   GLUCOSE 158* 171* 182* 166* 166* 212* 119*  BUN 16 16 17 19 20  24* 34*  CREATININE 0.59 0.62 0.66 0.74 0.65 0.75 0.95  CALCIUM 8.3* 8.4 8.3* 8.5 8.6 8.9 8.7  MG 2.0 2.0  --  2.1  --   --  1.9  PHOS 3.5 3.5  --  3.8  --   --  2.4   Liver Function Tests:  Recent Labs Lab 04/16/13 0512 04/18/13 0622 04/21/13 0300  AST 56* 33 19  ALT 69* 49 26  ALKPHOS 80 74 66  BILITOT 0.3 <0.2* 0.3  PROT 4.9* 5.1* 5.1*  ALBUMIN 2.1* 2.2* 2.3*   No results found for this basename: LIPASE, AMYLASE,  in the last 168 hours No results found for this basename: AMMONIA,  in the last 168 hours CBC:  Recent Labs Lab 04/15/13 0530  04/17/13 1120 04/18/13 0622 04/19/13 0620 04/20/13 0353 04/21/13 0300  WBC 7.5  < > 8.4 8.6 8.7 11.2* 10.8*  NEUTROABS 5.5  --   --  6.4  --   --   --   HGB 8.6*  < > 8.0* 8.1* 7.7* 8.2* 7.4*  HCT 28.0*  < > 26.3* 26.4* 24.6* 26.4* 23.4*  MCV 96.9  < > 96.3 96.0 95.0 96.7 94.0  PLT 108*  < > 110* 121* 117* 149* 151  < > = values in this interval not displayed. Cardiac Enzymes: No results found for this basename: CKTOTAL, CKMB, CKMBINDEX, TROPONINI,  in the last 168 hours BNP: No components found with this basename: POCBNP,  CBG:  Recent Labs Lab 04/20/13 1134 04/20/13 1643 04/20/13 2024 04/21/13 0048 04/21/13 0300  GLUCAP 125* 148* 178* 153* 115*    Recent Results (from the past 240 hour(s))  URINE CULTURE     Status: None   Collection Time    04/13/13  2:31 PM      Result Value Ref Range Status   Specimen Description  URINE, RANDOM   Final   Special Requests NONE   Final   Culture  Setup Time     Final   Value: 04/13/2013 18:52     Performed at [facility] Count     Final   Value: 8,000 COLONIES/ML     Performed at [facility]   Culture     Final   Value: INSIGNIFICANT GROWTH     Performed at [facility]   Report Status 04/14/2013 FINAL   Final  CLOSTRIDIUM DIFFICILE BY PCR     Status: Abnormal   Collection Time    04/16/13  4:44 PM      Result Value Ref Range Status   C difficile by pcr POSITIVE (*) NEGATIVE Final   Comment: CRITICAL RESULT CALLED TO, READ BACK BY AND VERIFIED WITH:     M.CROSSON,RN 1806 04/16/13 M.CAMPBELL     Scheduled Meds: . amiodarone  200 mg Oral BID  . antiseptic oral rinse  15 mL Mouth Rinse QID  . atorvastatin  40 mg Oral Daily  . chlorhexidine  15 mL Mouth Rinse BID  . diltiazem  120 mg Oral TID WC & HS  . famotidine (PEPCID) IV  20 mg Intravenous Q24H  . furosemide  40 mg Intravenous Once  . furosemide  40 mg Intravenous BID  . hydrocortisone cream   Topical BID  . insulin aspart  0-15 Units Subcutaneous 6 times per day  . metoprolol tartrate  12.5 mg Oral BID  . metroNIDAZOLE  500 mg Oral 3 times per day  . sodium chloride  10-40 mL Intracatheter Q12H   Continuous Infusions: . sodium chloride 10 mL/hr at 04/20/13 1200  . TPN (CLINIMIX) Adult without lytes 83 mL/hr at 04/20/13 1811   And  . fat emulsion 250 mL (04/20/13 1812)  . heparin Stopped (04/21/13 0600)     Jeralyn Bennett, DO  [facility] Pager [phone]  If 7PM-7AM, please contact night-coverage [email or web address] Password [email or web address] 04/21/2013, 7:40 AM   LOS: 24 days

## 2013-04-21 NOTE — Progress Notes (Signed)
eLink Physician-Brief Progress Note Patient Name: Tim BarerJames B Fetherolf DOB: 12/05/1934 MRN: [id number]  Date of Service  04/21/2013   HPI/Events of Note   Agitation  On morphine iV  eICU Interventions  Use versed prn   Intervention Category Minor Interventions: Agitation / anxiety - evaluation and management  Oretha MilchRakesh V Macayla Ekdahl 04/21/2013, 1:09 AM

## 2013-04-21 NOTE — Progress Notes (Signed)
PARENTERAL NUTRITION CONSULT NOTE - FOLLOW UP  Pharmacy Consult for TPN Indication: inadequate PO intake, resolving SBO  Allergies  Allergen Reactions  . Corticosteroids     Inhaled Corticosteroids--Hoarseness, dry mouth  . Furosemide Other (See Comments)    Ototoxicity     Patient Measurements: Height: 5' 8.9" (175 cm) Weight: 208 lb 5.4 oz (94.5 kg) IBW/kg (Calculated) : 70.47   Vital Signs: Temp: 98.4 F (36.9 C) (04/09 0729) Temp src: Oral (04/09 0729) BP: 106/70 mmHg (04/09 0850) Pulse Rate: 80 (04/09 0850) Intake/Output from previous day: 04/08 0701 - 04/09 0700 In: 2404.5 [I.V.:492.5; IV Piggyback:50; TPN:1862] Out: 700 [Urine:700] Intake/Output from this shift:    Labs:  Recent Labs  04/19/13 0620 04/20/13 0353 04/21/13 0300  WBC 8.7 11.2* 10.8*  HGB 7.7* 8.2* 7.4*  HCT 24.6* 26.4* 23.4*  PLT 117* 149* 151  APTT 61*  --   --   INR 1.07  --   --      Recent Labs  04/19/13 0620 04/20/13 0353 04/21/13 0300  NA 141 140 140  K 4.4 5.1 3.5*  CL 103 102 101  CO2 25 28 26   GLUCOSE 166* 212* 119*  BUN 20 24* 34*  CREATININE 0.65 0.75 0.95  CALCIUM 8.6 8.9 8.7  MG  --   --  1.9  PHOS  --   --  2.4  PROT  --   --  5.1*  ALBUMIN  --   --  2.3*  AST  --   --  19  ALT  --   --  26  ALKPHOS  --   --  66  BILITOT  --   --  0.3   Estimated Creatinine Clearance: 73.8 ml/min (by C-G formula based on Cr of 0.95).    Recent Labs  04/21/13 0300 04/21/13 0726 04/21/13 0804  GLUCAP 115* 171* 173*    Insulin Requirements in the past 24 hours:  6 units of Novolog + 15 units regular insulin in TPN bag  Current Nutrition:  -Clinimix E 5/15 at 83 ml/hr without lytes + 20% lipid emulsion at 10 ml/hr (provides 1894kCal (~92% of goal) and 100g protein (91% of goal) per day)   Nutritional Goals:  2050-2250 kCal, 110-120 grams of protein per day per RD 4/1  Admit: Transferred from [facility] with abdominal pain, partial SBO, ileus.  GI: Hx GERD, PUD -  resolved ileus per surgery.  Fall 2014 - volvulus, multiple abd surgeries, fistula development, was on TPN long-term but recently stopped and was tolerating PO intake per report. Now with poor PO intake. Currently NPO. Please see RD note 3/27: recs for short-term enteral support via NGT is appropriate as he has a functioning GI tract. Prealbumin trended up to 18.6 on 4/6. PEG tube was planned for yesterday but per Dr. Miles CostainShick, still too much barium in stomach to safely perform perc G tube. Repeat KUB ordered for today.   Endo: Hx DM (A1c 6.4) - CBG have trended up to 171-178 today with SSI + insulin in TPN, continues on daily PO prednisone.   Lytes: Na 140, K 5.1>>3.5, phos and Mg WNL. Corr Ca 10.06, MIVF stopped on 4/8 likely due to K trending up. K trended down likely due to combination of stopping MIVF, removing electrolytes from TPN and increasing rate.   Renal: SCr slight trend up to 0.95   Pulm: Hx asthma, OSA - 98% on 40% FiO2-  Daliresp  Cards: Hx afib, HTN, HLD, CAD - BP  106/70, HR 80 (NSR s/p cardioversion 3/27 but now in afib again) - amio, lipitor, dilt, heparin (transitioned from Xarelto for PEG tube placement)  Hepatobil: LFTs wnl, Tbili WNL, trigs WNL- HIT panel neg. Albumin low at 2.3   Neuro: Hx RLS, back pain, migraines - A&O  ID: Completed a 7d course of abx for HCAP 3/26 - Afebrile, WBC WNL - flagyl D#5 for Cdiff  Best Practices: Heparin infusion, MC, PPI PO  TPN Access: PICC placed 3/22  TPN day#: 15  Plan:  1. Continue rate and change to Clinimix E 5/15 at 83 ml/hr with electrolytes + 20% lipid emulsion at 10 ml/hr (provides 1894kCal (~92% of goal) and 100g protein (91% of goal) per day) since PEG tube delayed  2. Continue SSI. Increase insulin in bag to 20 units insulin in TPN - monitor  3. KCl 10 mEq x 3 runs  4. Will d/c Pepcid order and add Pepcid to TPN bag.  5. Daily MVI; trace elements M/W/F only due to national shortage  6. F/u AM labs, especially K  7. F/u  PEG placement and ability to start enteral diet  Vinnie Level, PharmD.  Clinical Pharmacist Pager [phone]

## 2013-04-21 NOTE — Progress Notes (Signed)
Physical Therapy Treatment Patient Details Name: Tim Short MRN: [id number] DOB: 1934-03-08 Today's Date: 04/21/2013    History of Present Illness 79 yo male with recent complications at [facility] after an ablation for afib in oct 2014 he developed a volvulus after the ablation was intubated/trachedm required multiple abd surgeries then developed fistula details unknown. Has been on tpn, with ngt on/off since. Is at [facility] sent here for complaints of abd pain.    PT Comments    Per RN, pt was alert after she gave him morphine for pain. Pt is supposed to go for a PEG later today. Attempted ROM exercises with poor participation due to lethargy. Pt at times resisting movements with legs, then will cooperate for 3-5 reps and then becomes PROM. Repositioned for edema management of RUE and to promote Rt ankle dorsiflexion and hip rotation to neutral.    Follow Up Recommendations  LTACH     Equipment Recommendations  None recommended by PT    Recommendations for Other Services       Precautions / Restrictions Precautions Precautions: Fall Restrictions Weight Bearing Restrictions: No    Mobility  Bed Mobility                  Transfers                    Ambulation/Gait                 Stairs            Wheelchair Mobility    Modified Rankin (Stroke Patients Only)       Balance                                    Cognition Arousal/Alertness: Lethargic;Suspect due to medications (had morphine for pain) Behavior During Therapy: Flat affect Overall Cognitive Status: Difficult to assess                      Exercises General Exercises - Lower Extremity Ankle Circles/Pumps: Both;10 reps;Supine;Other (comment);PROM (dorsiflexion to neutral with prolonged stretch) Heel Slides: Strengthening;Both;10 reps;Supine;Other (comment);AAROM (assisted flexion; resisted extension) Hip ABduction/ADduction: Both;Supine;PROM;5  reps;AAROM Other Exercises Other Exercises: RUE very edematous and weeping; Rt hand pumps; elevated for edema management Other Exercises: Rt hip internal rotation PROM to neutral; positioning with pillows and bolsters for ankle dorsiflexion and hip rotation to neutral    General Comments        Pertinent Vitals/Pain HR 105-130 (fluctuates even at rest; per cardiology HR down to 110-120s on amiodorone) SaO2 99-100% on PRVC FiO2 40% with RR 16    Home Living                      Prior Function            PT Goals (current goals can now be found in the care plan section) Acute Rehab PT Goals PT Goal Formulation: With patient Time For Goal Achievement: 04/25/13 Potential to Achieve Goals: Fair Progress towards PT goals: Not progressing toward goals - comment (lethargic)    Frequency  Min 2X/week    PT Plan Current plan remains appropriate;Frequency needs to be updated    Co-evaluation             End of Session Equipment Utilized During Treatment: Oxygen (on vent; PRVC) Activity Tolerance: Patient limited by lethargy Patient  left: in bed;with call bell/phone within reach     Time: 0914-0937 PT Time Calculation (min): 23 min  Charges:  $Therapeutic Exercise: 23-37 mins                    G Codes:      Scherrie NovemberLynn P Kaysey Berndt 04/21/2013, 9:48 AM Pager [phone]

## 2013-04-22 ENCOUNTER — Inpatient Hospital Stay (HOSPITAL_COMMUNITY): Payer: Medicare HMO

## 2013-04-22 LAB — CBC
HEMATOCRIT: 23.9 % — AB (ref 39.0–52.0)
HEMOGLOBIN: 7.5 g/dL — AB (ref 13.0–17.0)
MCH: 29 pg (ref 26.0–34.0)
MCHC: 31.4 g/dL (ref 30.0–36.0)
MCV: 92.3 fL (ref 78.0–100.0)
Platelets: 150 10*3/uL (ref 150–400)
RBC: 2.59 MIL/uL — ABNORMAL LOW (ref 4.22–5.81)
RDW: 19.4 % — ABNORMAL HIGH (ref 11.5–15.5)
WBC: 8.8 10*3/uL (ref 4.0–10.5)

## 2013-04-22 LAB — BASIC METABOLIC PANEL
BUN: 30 mg/dL — ABNORMAL HIGH (ref 6–23)
BUN: 31 mg/dL — ABNORMAL HIGH (ref 6–23)
CALCIUM: 8.3 mg/dL — AB (ref 8.4–10.5)
CHLORIDE: 96 meq/L (ref 96–112)
CO2: 28 meq/L (ref 19–32)
CO2: 27 mEq/L (ref 19–32)
Calcium: 8.2 mg/dL — ABNORMAL LOW (ref 8.4–10.5)
Chloride: 100 mEq/L (ref 96–112)
Creatinine, Ser: 0.83 mg/dL (ref 0.50–1.35)
Creatinine, Ser: 0.76 mg/dL (ref 0.50–1.35)
GFR calc Af Amer: >90 mL/min (ref >90)
GFR calc non Af Amer: 83 mL/min — ABNORMAL LOW (ref >90)
GFR, EST NON AFRICAN AMERICAN: 86 mL/min — AB (ref >90)
Glucose, Bld: 106 mg/dL — ABNORMAL HIGH (ref 70–99)
Glucose, Bld: 131 mg/dL — ABNORMAL HIGH (ref 70–99)
POTASSIUM: 3 meq/L — AB (ref 3.7–5.3)
Potassium: 2.5 mEq/L — CL (ref 3.7–5.3)
SODIUM: 140 meq/L (ref 137–147)
Sodium: 142 mEq/L (ref 137–147)

## 2013-04-22 LAB — GLUCOSE, CAPILLARY
GLUCOSE-CAPILLARY: 122 mg/dL — AB (ref 70–99)
GLUCOSE-CAPILLARY: 127 mg/dL — AB (ref 70–99)
GLUCOSE-CAPILLARY: 141 mg/dL — AB (ref 70–99)
Glucose-Capillary: 174 mg/dL — ABNORMAL HIGH (ref 70–99)
Glucose-Capillary: 200 mg/dL — ABNORMAL HIGH (ref 70–99)

## 2013-04-22 LAB — HEPARIN LEVEL (UNFRACTIONATED)

## 2013-04-22 LAB — PHOSPHORUS: PHOSPHORUS: 2.7 mg/dL (ref 2.3–4.6)

## 2013-04-22 LAB — MAGNESIUM: MAGNESIUM: 1.8 mg/dL (ref 1.5–2.5)

## 2013-04-22 MED ORDER — MIDAZOLAM HCL 2 MG/2ML IJ SOLN
INTRAMUSCULAR | Status: AC | PRN
  Administered 2013-04-22 (×4): 1 mg via INTRAVENOUS

## 2013-04-22 MED ORDER — CEFAZOLIN SODIUM-DEXTROSE 2-3 GM-% IV SOLR
2.0000 g | Freq: Once | INTRAVENOUS | Status: AC
  Administered 2013-04-22: 2 g via INTRAVENOUS
  Filled 2013-04-22: qty 50

## 2013-04-22 MED ORDER — FENTANYL CITRATE 0.05 MG/ML IJ SOLN
INTRAMUSCULAR | Status: AC
  Filled 2013-04-22: qty 2

## 2013-04-22 MED ORDER — POTASSIUM CHLORIDE 10 MEQ/50ML IV SOLN
10.0000 meq | INTRAVENOUS | Status: AC
  Administered 2013-04-22 (×7): 10 meq via INTRAVENOUS
  Filled 2013-04-22 (×3): qty 50

## 2013-04-22 MED ORDER — POTASSIUM CHLORIDE 20 MEQ/15ML (10%) PO LIQD
40.0000 meq | Freq: Three times a day (TID) | ORAL | Status: DC
  Filled 2013-04-22 (×2): qty 30

## 2013-04-22 MED ORDER — GLUCAGON HCL (RDNA) 1 MG IJ SOLR
INTRAMUSCULAR | Status: AC | PRN
  Administered 2013-04-22: 1 mg via INTRAVENOUS

## 2013-04-22 MED ORDER — MIDAZOLAM HCL 2 MG/2ML IJ SOLN
INTRAMUSCULAR | Status: AC
  Filled 2013-04-22: qty 2

## 2013-04-22 MED ORDER — IOHEXOL 300 MG/ML  SOLN
50.0000 mL | Freq: Once | INTRAMUSCULAR | Status: AC | PRN
  Administered 2013-04-22: 20 mL

## 2013-04-22 MED ORDER — FENTANYL CITRATE 0.05 MG/ML IJ SOLN
INTRAMUSCULAR | Status: AC | PRN
  Administered 2013-04-22 (×4): 50 ug via INTRAVENOUS

## 2013-04-22 MED ORDER — FAT EMULSION 20 % IV EMUL
240.0000 mL | INTRAVENOUS | Status: AC
  Administered 2013-04-22: 240 mL via INTRAVENOUS
  Filled 2013-04-22: qty 250

## 2013-04-22 MED ORDER — FUROSEMIDE 10 MG/ML IJ SOLN
40.0000 mg | Freq: Four times a day (QID) | INTRAMUSCULAR | Status: AC
  Administered 2013-04-22 – 2013-04-23 (×2): 40 mg via INTRAVENOUS
  Filled 2013-04-22 (×2): qty 4

## 2013-04-22 MED ORDER — POTASSIUM CHLORIDE 10 MEQ/50ML IV SOLN
10.0000 meq | INTRAVENOUS | Status: AC
  Administered 2013-04-22 (×2): 10 meq via INTRAVENOUS
  Filled 2013-04-22 (×4): qty 50

## 2013-04-22 MED ORDER — POTASSIUM CHLORIDE 10 MEQ/50ML IV SOLN
INTRAVENOUS | Status: AC
  Filled 2013-04-22: qty 100

## 2013-04-22 MED ORDER — TRACE MINERALS CR-CU-F-FE-I-MN-MO-SE-ZN IV SOLN
INTRAVENOUS | Status: AC
  Administered 2013-04-22: 19:00:00 via INTRAVENOUS
  Filled 2013-04-22: qty 2000

## 2013-04-22 MED ORDER — GLUCAGON HCL (RDNA) 1 MG IJ SOLR
INTRAMUSCULAR | Status: AC
  Filled 2013-04-22: qty 1

## 2013-04-22 MED ORDER — METOLAZONE 5 MG PO TABS
5.0000 mg | ORAL_TABLET | Freq: Every day | ORAL | Status: AC
  Filled 2013-04-22: qty 1

## 2013-04-22 NOTE — Progress Notes (Signed)
PULMONARY / CRITICAL CARE MEDICINE   Name: DWIGHT ADAMCZAK MRN: [id number] DOB: 1934/07/17    ADMISSION DATE:  03/28/2013 CONSULTATION DATE:  04/20/2013  REFERRING MD :  Dr. Vanessa Barbara PRIMARY SERVICE:  PCCM  CHIEF COMPLAINT:  AMS / Hypoxia   BRIEF PATIENT DESCRIPTION: 79 yo with complicated, prolonged illness since 09/2012 with respiratory failure, tracheostomy, dysphagia, cholecystitis s/p cholecystectomy, colon fistula, IVC filter placement, SBO, AF with RVR.  4/8 patient decompensated on medical floor with AMS and hypoxia.  PCCM consulted for evaluation.    SIGNIFICANT EVENTS / STUDIES:   09/2012 - Admit at [facility] for failed MAIZE procedure, developed volvulus requiring R colectomy.  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 03/07/13 - Developed abd pain, distention at [facility].  Taken to OR in [facility] for gallbladder removal, resection of colon fistula, IVC filter placement 03/2013 - Returned back to [facility] 03/16/13, started on TPN.  Passed swallow eval 3/9 03/28/13 - Progressive abd pain / distension.  Tx to [facility] with CT abdomen with early SBO/Ileus.  Admitted by [facility]. 04/08/13 - SBO resolved.  Surgery s/o.  Developed Afib with RVR.  Cardioverted per Cardiology.  SR 3/28-3/29 with plan for d/c on 3/31 but went back into fib on 3/30 -on heparin gtt.  TPN continued.   04/20/13 - decompensated & Tx to ICU  LINES / TUBES: Trach (chronic)>>> LUE PICC 3/22>>>  CULTURES: Sputum 3/22>>> KLEBSIELLA BCx2 3/26>>>neg C-Diff 3/29>>>neg UC 4/2>>>neg C-Diff 4/4 >>> POSITIVE  ANTIBIOTICS: Flagyl 4/4>>>  SUBJECTIVE: awoken from sleep, follows commands but goes back to sleep   VITAL SIGNS: Temp:  [98.1 F (36.7 C)-99.3 F (37.4 C)] 98.9 F (37.2 C) (04/10 0728) Pulse Rate:  [48-131] 64 (04/10 0728) Resp:  [13-26] 19 (04/10  0728) BP: (83-132)/(43-84) 100/43 mmHg (04/10 0715) SpO2:  [98 %-100 %] 99 % (04/10 0728) FiO2 (%):  [40 %] 40 % (04/10 0728) Weight:  [202 lb 2.6 oz (91.7 kg)] 202 lb 2.6 oz (91.7 kg) (04/10 0213)  HEMODYNAMICS:    VENTILATOR SETTINGS: Vent Mode:  [-] PRVC FiO2 (%):  [40 %] 40 % Set Rate:  [16 bmp] 16 bmp Vt Set:  [600 mL] 600 mL PEEP:  [5 cmH20-10 cmH20] 5 cmH20 Plateau Pressure:  [16 cmH20-25 cmH20] 18 cmH20  INTAKE / OUTPUT: Intake/Output     04/09 0701 - 04/10 0700 04/10 0701 - 04/11 0700   I.V. (mL/kg) 258.3 (2.8)    IV Piggyback 150    TPN 1860    Total Intake(mL/kg) 2268.3 (24.7)    Urine (mL/kg/hr)     Total Output       Net +2268.3          Urine Occurrence 5 x      PHYSICAL EXAMINATION: General:  Chronically ill appearing adult male in NAD on vent Neuro:  follows commands HEENT:  Mm pale, moist.  Trach midline (?portex) c/d/i Cardiovascular:  s1s2 irr irr, no m/r/g Lungs: coarse breath sounds b/l throughout Abdomen:  Round/soft, bsx4 hypoactive Musculoskeletal:  No acute deformities Skin:  Warm/dry, trace edema in BLE  LABS:  CBC  Recent Labs Lab 04/20/13 0353 04/21/13 0300 04/22/13 0311  WBC 11.2* 10.8* 8.8  HGB 8.2* 7.4* 7.5*  HCT 26.4* 23.4* 23.9*  PLT 149* 151 150   Coag's  Recent Labs Lab 04/16/13 1230 04/17/13 1100 04/18/13 0622 04/19/13 7829  APTT 30 72* 92* 61*  INR 1.13  --   --  1.07   BMET  Recent Labs Lab 04/20/13 0353 04/21/13 0300 04/22/13 0311  NA 140 140 140  K 5.1 3.5* 2.5*  CL 102 101 96  CO2 28 26 28   BUN 24* 34* 30*  CREATININE 0.75 0.95 0.83  GLUCOSE 212* 119* 106*   Electrolytes  Recent Labs Lab 04/18/13 0622  04/20/13 0353 04/21/13 0300 04/22/13 0311  CALCIUM 8.5  < > 8.9 8.7 8.3*  MG 2.1  --   --  1.9 1.8  PHOS 3.8  --   --  2.4 2.7  < > = values in this interval not displayed. Sepsis Markers No results found for this basename: LATICACIDVEN, PROCALCITON, O2SATVEN,  in the last 168  hours ABG  Recent Labs Lab 04/20/13 0830 04/20/13 1027  PHART 7.122* 7.419  PCO2ART 92.1* 38.9  PO2ART 61.5* 60.0*   Liver Enzymes  Recent Labs Lab 04/16/13 0512 04/18/13 0622 04/21/13 0300  AST 56* 33 19  ALT 69* 49 26  ALKPHOS 80 74 66  BILITOT 0.3 <0.2* 0.3  ALBUMIN 2.1* 2.2* 2.3*   Cardiac Enzymes No results found for this basename: TROPONINI, PROBNP,  in the last 168 hours Glucose  Recent Labs Lab 04/21/13 0804 04/21/13 1124 04/21/13 1707 04/21/13 2014 04/22/13 0003 04/22/13 0352  GLUCAP 173* 165* 127* 169* 122* 127*   IMAGING:   Dg Chest Port 1 View  04/22/2013   CLINICAL DATA:  Respiratory failure.  EXAM: PORTABLE CHEST - 1 VIEW  COMPARISON:  DG CHEST 1V PORT dated 04/20/2013  FINDINGS: Tracheostomy tube noted good anatomic position. Left PICC line in stable position. Stable cardiomegaly. Left atrial appendage clip again noted. Progressive bilateral pulmonary infiltrates are present. Small left effusion cannot be excluded. No pneumothorax. No acute osseous abnormality.  IMPRESSION: 1. Line and tube position stable. 2. Stable cardiomegaly.  Left atrial appendage clip again noted. 3. Progressive bilateral pulmonary alveolar infiltrates most likely secondary to pulmonary edema. Bilateral basilar pneumonia cannot be excluded. Small left pleural effusion cannot be excluded.   Electronically Signed   By: Maisie Fus  Register   On: 04/22/2013 07:56   Dg Chest Port 1 View  04/20/2013   CLINICAL DATA:  Acute respiratory failure, on ventilator. Hypertension.  EXAM: PORTABLE CHEST - 1 VIEW  COMPARISON:  DG CHEST 1V PORT dated 04/19/2013  FINDINGS: Tracheostomy is midline. Left PICC tip projects over the SVC. Left atrial appendage occlusion clip is in place. Cardiomediastinal silhouette is enlarged, stable. Biapical pleural thickening. Mild diffuse interstitial prominence and indistinctness persists. Overall aeration has improved slightly in the interval, however. Small bilateral  pleural effusions. Left lower lobe collapse/ consolidation. Retained contrast in the stomach.  IMPRESSION: 1. Slight improvement in congestive heart failure. 2. Left lower lobe collapse/consolidation.   Electronically Signed   By: Leanna Battles M.D.   On: 04/20/2013 09:50   Dg Abd Portable 1v  04/22/2013   CLINICAL DATA:  Check barium position  EXAM: PORTABLE ABDOMEN - 1 VIEW  COMPARISON:  04/21/2013  FINDINGS: Contrast material has progressed into the distal colon. Only minimal opacification of the distal transverse colon is seen. A Trapease filter is noted. No acute abnormality is seen.  IMPRESSION: Contrast material within the distal colon.   Electronically Signed   By: Alcide Clever M.D.   On: 04/22/2013 07:41   Dg Abd Portable 1v  04/21/2013   CLINICAL DATA:  re-check for barium  EXAM: PORTABLE ABDOMEN -  1 VIEW  COMPARISON:  DG ABD PORTABLE 1V dated 04/21/2013  FINDINGS: Small amount of residual barium within the stomach. Barium has progressed into loops of small bowel and colon. The rectum is not included on this study. Air-filled loops of large small bowel are appreciated.  IMPRESSION: Contrast is appreciated within loops of colon and small bowel. Minimal residual contrast in the stomach. There are air-filled loops of bowel nonspecific nonobstructive pattern.   Electronically Signed   By: Salome HolmesHector  Cooper M.D.   On: 04/21/2013 11:45   Dg Abd Portable 1v  04/21/2013   CLINICAL DATA:  Evaluate barium.  EXAM: PORTABLE ABDOMEN - 1 VIEW  COMPARISON:  DG ABD PORTABLE 1V dated 04/20/2013; DG ABD PORTABLE 1V dated 04/07/2013; CT ABD/PELVIS W CM dated 04/04/2013  FINDINGS: Small amount of residual barium noted in the stomach. Barium at now progressed to the colon to the level of the rectum. Proximal small bowel distention remains. Moderate distention of the transverse colon noted. No free air. Cholecystectomy. No acute osseous abnormality.  IMPRESSION: Barium has now progressed to the colon and rectum. Moderate  distention of proximal small bowel loops and of the transverse colon noted .   Electronically Signed   By: Maisie Fushomas  Register   On: 04/21/2013 07:45   ASSESSMENT / PLAN:  PULMONARY A: Acute on chronic respiratory failure, Mucus plug? Chronic Tracheostomy, OSA  Stable L effusion COPD? No acute bronchospasm P:   - Goal pH>7.30, SpO2>92. - Continuous mechanical support, wean as able, desaturated on TC. - VAP bundle. - Trend ABG/CXR prn - Xopenex PRN. - Daliresp has been d/c - No indication for systemic steroids. - No indication for thoracentesis. - Continue aggressive diureses today.  CARDIOVASCULAR A:  AF / RVR, HTN, HLD CAD, s/p cath with PCI P:  - Amiodarone, Cardizem, Lopressor, Lipitor. - Lopressor for HR >110. - Heparin gtt (stopped for procedure)  RENAL A:   Fluid overlaod P:   - Trend BMP. - Lasix. - Replete electrolytes   GASTROINTESTINAL A:   Dysphagia GERD C. Diff Pseudomembranous colitis P:   - TNA. - IR to place G-Tube today. - Hopeful to start tube feeds after tube placement & d/c TNA.  HEMATOLOGIC A:   Chronic anemia Therapeutic anticoagulation for AF P:  - Trend CBC. - Heparin GTT (off for procedure).  INFECTIOUS A:   Pseudomembranous colitis P:   - Flagyl.  ENDOCRINE A:   DM II At risk for adrenal insufficiency ( chronic low dose Prednisone ? ) P:   - SSI, change to q4h. - Hydrocortisone if hypotensive.  NEUROLOGIC A:   Acute encephalopathy P:   - D/c Xanax. - Morphine PRN.  Belia HemanNeema K Sharda, MD ([facility], PGY3)  Will hold weaning for today, will go down for a PEG in IR then will start TF and d/c TPN once able to use PEG.  Aggressive diureses and replace K.  I have personally obtained history, examined patient, evaluated and interpreted laboratory and imaging results, reviewed medical records, formulated assessment / plan and placed orders.  CRITICAL CARE:  The patient is critically ill with multiple organ systems failure and  requires high complexity decision making for assessment and support, frequent evaluation and titration of therapies, application of advanced monitoring technologies and extensive interpretation of multiple databases. Critical Care Time devoted to patient care services described in this note is 35 minutes.   Alyson ReedyWesam G. Holliday Sheaffer, M.D. [facility]/Critical Care Medicine. Pager: [phone]. After hours pager: [phone].

## 2013-04-22 NOTE — Sedation Documentation (Signed)
Patient denies pain and is resting comfortably.  

## 2013-04-22 NOTE — Progress Notes (Signed)
ANTICOAGULATION CONSULT NOTE - Follow Up Consult  Pharmacy Consult for heparin  Indication: xarelto to UFH bridge for g-tube placement  Allergies  Allergen Reactions  . Corticosteroids     Inhaled Corticosteroids--Hoarseness, dry mouth  . Furosemide Other (See Comments)    Ototoxicity     Patient Measurements: Height: 5' 8.9" (175 cm) Weight: 202 lb 2.6 oz (91.7 kg) IBW/kg (Calculated) : 70.47 Heparin Dosing Weight:   Vital Signs: Temp: 99.1 F (37.3 C) (04/10 0000) Temp src: Oral (04/10 0000) BP: 99/55 mmHg (04/10 0414) Pulse Rate: 67 (04/10 0414)  Labs:  Recent Labs  04/19/13 0620 04/20/13 0353 04/21/13 0300 04/22/13 0311  HGB 7.7* 8.2* 7.4* 7.5*  HCT 24.6* 26.4* 23.4* 23.9*  PLT 117* 149* 151 150  APTT 61*  --   --   --   LABPROT 13.7  --   --   --   INR 1.07  --   --   --   HEPARINUNFRC 0.39 0.42 0.17* <0.10*  CREATININE 0.65 0.75 0.95  --     Estimated Creatinine Clearance: 72.8 ml/min (by C-G formula based on Cr of 0.95).   Assessment:       Heparin level <0.1 on 1250/hr. H/h 7.5/23.9 remains low but consistent.  Goal of Therapy:  Heparin level 0.3-0.7 units/ml    Plan:  Increase heparin to 1500 units/hr and recheck in 8 hours.   Tim CoffinWilliam Jonathan Tomeika Short 04/22/2013,5:05 AM

## 2013-04-22 NOTE — Progress Notes (Signed)
PARENTERAL NUTRITION CONSULT NOTE - FOLLOW UP  Pharmacy Consult for TPN Indication: inadequate PO intake, resolving SBO  Allergies  Allergen Reactions  . Corticosteroids     Inhaled Corticosteroids--Hoarseness, dry mouth  . Furosemide Other (See Comments)    Ototoxicity    Patient Measurements: Height: 5' 8.9" (175 cm) Weight: 202 lb 2.6 oz (91.7 kg) IBW/kg (Calculated) : 70.47  Vital Signs: Temp: 98.9 F (37.2 C) (04/10 0728) Temp src: Oral (04/10 0728) BP: 100/43 mmHg (04/10 0715) Pulse Rate: 64 (04/10 0728) Intake/Output from previous day: 04/09 0701 - 04/10 0700 In: 2268.3 [I.V.:258.3; IV Piggyback:150; TPN:1860] Out: -  Intake/Output from this shift:    Labs:  Recent Labs  04/20/13 0353 04/21/13 0300 04/22/13 0311  WBC 11.2* 10.8* 8.8  HGB 8.2* 7.4* 7.5*  HCT 26.4* 23.4* 23.9*  PLT 149* 151 150     Recent Labs  04/20/13 0353 04/21/13 0300 04/22/13 0311  NA 140 140 140  K 5.1 3.5* 2.5*  CL 102 101 96  CO2 28 26 28   GLUCOSE 212* 119* 106*  BUN 24* 34* 30*  CREATININE 0.75 0.95 0.83  CALCIUM 8.9 8.7 8.3*  MG  --  1.9 1.8  PHOS  --  2.4 2.7  PROT  --  5.1*  --   ALBUMIN  --  2.3*  --   AST  --  19  --   ALT  --  26  --   ALKPHOS  --  66  --   BILITOT  --  0.3  --    Estimated Creatinine Clearance: 83.3 ml/min (by C-G formula based on Cr of 0.83).    Recent Labs  04/21/13 2014 04/22/13 0003 04/22/13 0352  GLUCAP 169* 122* 127*    Insulin Requirements in the past 24 hours:  12 units of Novolog + 20 units regular insulin in TPN bag  Current Nutrition:  Clinimix E 5/15 at 83 ml/hr + 20% lipid emulsion at 10 ml/hr (provides 1894kCal (~92% of goal) and 100g protein (91% of goal) per day)   Nutritional Goals:  1540-1750 kCal, >140 grams of protein per day per RD 4/9 (permissive underfeeding)  Admit: Transferred from [facility] with abdominal pain, partial SBO, ileus.  GI: Hx GERD, PUD - resolved ileus per surgery.  Fall 2014 - volvulus,  multiple abd surgeries, fistula development, was on TPN long-term but recently stopped and was tolerating PO intake per report. Now with poor PO intake. Currently NPO except sips with meds. Prealbumin trended up to 18.6 on 4/6. PEG tube placement was planned for 4/8 but was delayed by IR as patient still had barium in his stomach. Surgery was then going to do yesterday, but they do not feel he is a good candidate for a PEG d/t multiple gastrointestinal surgeries and complications along with his distorted anatomy and CDiff infection  Endo: Hx DM (A1c 6.4) - CBGs 106-171 in last 24 hours with SSI + insulin in TPN. Prednisone stopped 4/8  Lytes: Na 140, K remains low at 2.5 (currently getting 6 runs per CCM) phos 2.7, Mag 1.8, Corr Ca 9.6. Lytes were added back to TPN with bag hung yesterday at 1800. Still appears that K has not fully recovered  Renal: SCr stable at 0.83, est CrCL ~56mL/min  Pulm: Hx asthma, OSA - 99% on 40% FiO2. Daliresp stopped 4/8, not currently on meds  Cards: Hx afib, HTN, HLD, CAD - BP 100/43, HR 48-131 (NSR s/p cardioversion 3/27 but now in afib again) -  amio, lipitor, dilt, heparin (transitioned from Xarelto for PEG tube placement)  Hepatobil: LFTs wnl except low albumin at 2.3, Tbili WNL, trigs 114; HIT panel neg.  Neuro: Hx RLS, back pain, migraines - A&O  ID: Completed a 7d course of abx for HCAP 3/26 - Afebrile, WBC WNL - Flagyl D#6 for Cdiff  Best Practices: Heparin infusion, MC, Pepcid in TPN  TPN Access: PICC placed 3/22  TPN day#: 16  Plan:  1. Continue Clinimix E 5/15 at 83 ml/hr + 20% lipid emulsion at 10 ml/hr- provides 1894kCal (>100% of goal) and 100g protein (~71% of goal) per day. Due to restrictions of premade Clinimix bags and inability to use a repeater pump due to tubing restrictions from [employer], unable to meet patient's re-estimated protein needs. 2. Daily MVI in TPN; trace elements M/W/F only due to national shortage   3. Continue moderate SSI  and continue 20 units regular insulin in TPN bag 4. Continue Pepcid 20mg  in TPN bag.  5. KCl 10mEq runs x3 when 6 runs ordered by CCM are complete (since 10mEq of KCl increases K ~0.601mEq/L, 6 runs will only increase K to ~3.1 which is still low) 6. BMET, mag and phos in the morning 7. F/u CCM, nutrition, surgery plans for any ability to place feeding tube and possibly wean off TPN.  Daquana Paddock D. Ekam Besson, PharmD, BCPS Clinical Pharmacist Pager: [phone] 04/22/2013 8:26 AM

## 2013-04-22 NOTE — Procedures (Signed)
Successful fluoroscopic guided insertion of gastrostomy tube without immediate post procedural complicatoin.   The gastrostomy tube may be used immediately for medications.  Tube feeds may be initiated in 24 hours as per the primary team.   

## 2013-04-22 NOTE — Progress Notes (Signed)
PROGRESS NOTE  Tim Short BJY:[id number] DOB: 13-Dec-1934 DOA: 03/28/2013 PCP:  Duane Lope, MD Interim History Pt is 79 yo male with fairly recent hospitalization at [facility] in September 2014 for failed maize procedure and during that hospitalization pt developed volvulus and has required right colectomy (10/08/2012), subsequently developed multiple abscesses and enteric fistulas managed percutaneous drains. Pt was in prolonged respiratory failure at [facility], unable to wean of the vent and requiring trach placement 10/29/2012. He was transferred to [facility] in December 2014 and later to [facility] in late January 2015. He was on TNA and was doing fairly well initially, but developed more abdominal pain and on 2/23 taken to OR in [place] for gallbladder removal (secondary to cholecystitis), resection for part of the colon for fistula, IVC filter placement. Sent back to [facility] on 03/16/13 and started on TPN. Diet advanced on March 9th, 2015 after pt passed swallow evaluation. He was brought to [facility] March 16th, after noticing progressive abdominal distension. In ED, CT abdomen with air- fluid level and findings consistent with early partial SBO, ileus.  Patient admitted to [facility] 03/29/2013 with surgical consult, his SBO slowly resolved and surgery signed off 3/27. Throughout his hospitalization patient developed Atrial fibrillation with RVR to rated of 130s-140s, stable, and cardiology was consulted. Patient had several up titration of his medications including addition of Amiodarone, and was d/c cardioverted on 3/27. He maintained sinus rhythm through 3/28-3/29 with plans for discharge on 3/31, however on 3/30 went back into A fib with RVR and cardiology has been re consulted.   Electrophysiology was consulted and the patient's Cardizem CD was increased to 240 mg twice a day. His heart rate improved from 130s to 120s. Metoprolol tartrate was started with improvement of the heart rate to 110s. Cardiology has  signed off but may need to be reconsulted. The patient was initially started on TPN with hopes that this would be temporary. However there is no significant improvement in the patient's nutritional or clinical status. As a result, IR was consulted for gastrostomy tube placement. It is hopeful that this will be placed on 04/20/2013. He was initially scheduled for 04/19/2013, but contrast transit beyond the stomach was slow.  The patient's insurance has denied LTAC transfer.  Case management is working on appealing this as well as the patient's son. In the last 24 hours, the patient has an increasing frothy tracheal secretions. Chest x-ray revealed increasing vascular congestion. The patient was started on intravenous Lasix.   Patient seems a little better today, he is able to nod appropriately to my questions, following commands, awake and alert. Remains on vent support.   Assessment/Plan: Acute on Chronic respiratory failure  -ABG showing pH of 7.12 with PCO2 of 92.1, transferred to ICU placed on vent support  -May be due to volume overload, was given lasix 40mg  IV BID. -Currently on 40% FiO2, lung exam improved  Atrial fibrillation/flutter  -s/p cardioversion 3/27, restored sinus rhythm and maintained over the weekend -04/11/13 am--back in A fib with RVR, rates in the 130s.  -Cardiology reconsulted -continue amiodarone 200mg  bid per EP  -Defer chronotropic control to cardiology--diltiazem CD increased to 240bid  -HR's in the 60's to 70's now -add metoprolol tartrate 12.5 mg by mouth daily with HR 100-110 -on IV heparin   Clostridium difficile colitis  -Flagyl started 04/16/2013  -Partly contributing to abdominal pain  -fewer BMs, but still loose stools   Abdominal pain  - secondary  to ileus, partial SBO as observed on admission as well as Clostridium difficile colitis presently  -ileus/SBO resolved, patient with bowel movements  -has a degree of abdominal pain which is stable.  - continue  supportive care with analgesia, antiemetics as needed for symptom control.  -Consulted Surgery for PEG, felt could not safely be done with distorted anatomy, multiple complications, Cdiff infection.  -TPN initiated 3/26 and that can be weaned off if po intake improves and pt improves clinically  -long discussion with son regarding risks and benefits of enteral feeding vs TPN--he is amenable to gastrostomy tube placement   Moderate malnutrition  - secondary to acute on chronic illness as outlined above  - pt tolerating well however does not seem to meet his nutritional requirements per nutrition  - on TPN, appreciate nutrition and pharmacy consults.  -Surgery consulted for PEG tube placement, felt that with his multiple complications, could not safely place a PEG at this time.   Thrombocytopenia  -Suspect myelosuppression due to acute medical illness--stable, slowly improving  -Check serum B12--558  -RBC folate--950  -Peripheral smear--no schistocytes  -Fibrinogen--355  -INR 1.14   Leukocytosis  - likely secondary to ileus, HCAP as suggestive per CXR 3/20  - CT abd/pelvis with mild leak around anastomosis but no other acute events, and surgery signed off  - leukocytosis has now resolved   Dysuria  -UA neg for pyuria   HCAP -  -Finished one week cefepime/levoflox and vancomycin 04/07/2013  -Remains afebrile without any respiratory distress   Acute on chronic diastolic CHF - monitoring I's/O's, daily weights  - weight trend: 191 >>204 -start Lasix 40mg  IV bid - repeat CXR 4/7-->increase vascular congestion  Hypokalemia - continue supplementation. Magnesium normal.   Sacral ulcer - skin breakdown noted, closely monitor. Nursing care. No evidence of infection.   Anemia of chronic disease - no signs of active bleeding. Likely multifactorial ?medication induced, nutritional deficiencies. Closely monitor.   Essential hypertension -  -controlled   Tracheostomy status  -pt  requires frequent suctioning--will benefit going to LTAC  -Has had numerous episodes of mucous plugging requiring aggressive suctioning  -concerned about ability to provide level of care he needs at [facility]  - Patient with a desaturation event 3/30 evening requiring suctioning.  -pulmonary toilet   Hypernatremia - TPN per pharmacy. Improving, supplemented 3/30 with D5W.  Family Communication: Son notified of change in patient's status and transfer to ICU request made Disposition Plan: [name] vs [facility]          Procedures/Studies: Ct Abdomen Pelvis W Contrast  04/04/2013   CLINICAL DATA:  Liver flight non.  Leukocytosis.  EXAM: CT ABDOMEN AND PELVIS WITH CONTRAST  TECHNIQUE: Multidetector CT imaging of the abdomen and pelvis was performed using the standard protocol following bolus administration of intravenous contrast.  CONTRAST:  90mL OMNIPAQUE IOHEXOL 300 MG/ML  SOLN  COMPARISON:  DG ABD PORTABLE 1V dated 04/04/2013; CT ABD/PELVIS W CM dated 03/28/2013; CT ABD-PELV W/O CM dated 03/11/2013; CT ABD/PELVIS W CM dated 01/30/2013  FINDINGS: Stable moderate left and small right pleural effusions with associated passive atelectasis. Mild cardiomegaly. Postoperative findings along the gastroesophageal junction.  Slightly reduce marginal definition of the infiltrative hypodense process involving the right hepatic lobe and portions of segment 4 of the liver.  The spleen, adrenal glands, and pancreas unremarkable. Small amount of fluid along the gallbladder fossa and along the inferior edge of the right hepatic lobe could, similar to prior. Gallbladder surgically absent.  There is  contrast medium in the renal collecting systems on the initial portal venous phase images, likely due to inadvertent early injection.  Stable renal hypodensities. Infrarenal IVC filter. Continued fluid along the inferior margin of the laparotomy wound. Trace fluid in the lower omentum. Stable trace presacral edema.  Bilateral  chronic pars defects at L5 observed with 9 mm of anterolisthesis.  Orally administered contrast extends through to the rectum. Mild circumferential rectal wall thickening.  The patient seems to have had right hemicolectomy, with reanastomosis to the small bowel on image 39 of series 5. Outpouching from the proximal terminal margin of the remaining colon shown on images 32 through 21 of series 5, potentially some type of postoperative diverticulum or contained leak along the stable site.  IMPRESSION: 1. Similar distribution but reduced conspicuity of the peripheral infiltrative hepatic hypodensity affecting the right hepatic lobe an adjacent portion of segment 4. Fatty infiltration is the most common cause for and infiltrative hypodensity of this type, although localized parenchymal inflammation is not entirely excluded. MRI could differentiate if clinically warranted. 2. Small amount of perihepatic ascites, similar to prior. 3. Hypodense renal lesions, similar to prior. 4. Patient appears to have had right hemicolectomy with small bowel reanastomosis. There is a collection of gas and contrast extending to the right of the proximal terminal margin of the remaining colon, potentially a small contained leak along the staple line. 5. Continued fluid along the inferior margin of the laparotomy wound, but without internal gas density currently.   Electronically Signed   By: Herbie Baltimore M.D.   On: 04/04/2013 18:40   Ct Abdomen Pelvis W Contrast  03/28/2013   CLINICAL DATA:  Abdominal distention.  EXAM: CT ABDOMEN AND PELVIS WITH CONTRAST  TECHNIQUE: Multidetector CT imaging of the abdomen and pelvis was performed using the standard protocol following bolus administration of intravenous contrast.  CONTRAST:  OMNIPAQUE IOHEXOL 300 MG/ML  SOLN  COMPARISON:  DG ABD ACUTE W/CHEST dated 03/28/2013; CT ABD-PELV W/O CM dated 03/11/2013; CT ABD/PELVIS W CM dated 01/30/2013  FINDINGS: There is a new lucency is again noted  in the liver. This remains unchanged in appearance and may be infectious in etiology. No clearcut fluid collection in or air collection is noted in the liver to suggest a definite abscess. These changes may be related to phlegmon. Close follow up to evaluate for developing abscess suggested. Hepatic veins patent. Splenic and portal veins patent. No focal significant splenic abnormality. Pancreas normal. Cholecystectomy. No biliary distention.  Adrenals normal. Stable approximate 13 mm low-density lesion in the left kidney, not definite simple cyst. As noted on prior study, MRI of the kidneys suggest for further evaluation. No hydronephrosis. The bladder is nondistended. No free pelvic fluid. Prostate is not enlarged. Calcifications within the prostate. No significant adenopathy. Abdominal aorta is atherosclerotic. No aneurysm. Visceral vessels are patent. Inferior vena caval filter noted in the IVC with tip just below the renal veins.  Previously identified drainage catheter in the right upper quadrant has been removed. Inflammatory changes are noted in the right mid abdomen, no abscess noted. The patient has had a prior hemicolectomy. Reference is made to CT report of 01/31/2012 which discusses fistula changes within the abdomen. Patient status post right hemicolectomy. The colon is moderately distended and contains fluid levels. These changes have improved from prior exam suggesting improving ileus. These changes may be related to a diarrheal illness. Follow-up abdominal series suggested. No small bowel distention. The stomach is nondistended. No free air noted.  Atelectasis and/or infiltrates in the lung bases. Bilateral pleural effusions. Cardiomegaly. Coronary artery disease. Midline abdominal scar noted. Fluid noted in the region of the scar with associated small amount of air. Developing abscess should be considered. These findings have progressed slightly from prior study. Multiple anterior abdominal wall  subcutaneous nodules most likely injection granulomas.  IMPRESSION: 1. Findings consistent with persistent phlegmon in the liver. 2. Interim removal of right upper quadrant drainage catheter. 3. Colonic distention with air-fluid levels. These findings have improved from prior study suggesting improving adynamic ileus or diarrheal illness. Followup abdominal series suggested. 4. Slight progression of soft tissue fluid in the anterior abdomen in the region the patient's scarring. This could represent developing phlegmon/abscess. Small amount of air is noted within this fluid collection. 5. 13 mm low-density lesion left kidney, not definite simple cyst. As noted on prior study nonenhanced and enhanced MRI of the kidneys suggested for further evaluation.   Electronically Signed   By: Maisie Fus  Register   On: 03/28/2013 23:29   Dg Chest Port 1 View  04/19/2013   CLINICAL DATA:  Chronic respiratory failure and worsening shortness of Breath  EXAM: PORTABLE CHEST - 1 VIEW  COMPARISON:  04/10/2013  FINDINGS: A left-sided PICC line, tracheostomy tube and postsurgical changes are again seen and stable. The cardiac shadow is stable. Left-sided pleural effusion is again identified as well as left retrocardiac atelectasis. No focal chondral no other focal infiltrate is seen.  IMPRESSION: Stable changes in the left base.  No new focal abnormality is noted.   Electronically Signed   By: Alcide Clever M.D.   On: 04/19/2013 12:27   Dg Chest Port 1 View  04/10/2013   CLINICAL DATA:  Cough and congestion.  EXAM: PORTABLE CHEST - 1 VIEW  COMPARISON:  DG CHEST 1V PORT dated 04/03/2013  FINDINGS: Tracheostomy and left-sided PICC line positioning are stable. The right jugular central line has been removed since the prior chest x-ray. Lungs show lower volumes bilaterally with increased prominence of bilateral lower lobe atelectasis. There may be a component of left-sided pleural fluid. The heart remains moderately enlarged with evidence of  prior placement of a left atrial appendage clip. No overt pulmonary edema is identified.  IMPRESSION: Lower volumes with increased prominence of bilateral lower lobe atelectasis. There may be a component of left-sided pleural fluid.   Electronically Signed   By: Irish Lack M.D.   On: 04/10/2013 11:29   Dg Chest Port 1 View  04/03/2013   CLINICAL DATA:  Central venous catheter placement  EXAM: PORTABLE CHEST - 1 VIEW  COMPARISON:  DG CHEST 1V PORT dated 04/01/2013  FINDINGS: Left upper extremity PICC is been placed. Tip is in the lower SVC. Tracheostomy tube and left atrial appendage clip stable. Left pleural effusion and basilar consolidation stable. Vascular congestion increased.  IMPRESSION: Left PICC placed with its tip at the lower SVC.  Increased vascular congestion.  Stable left basilar consolidation and left pleural effusion.   Electronically Signed   By: Maryclare Bean M.D.   On: 04/03/2013 09:04   Dg Chest Port 1 View  04/01/2013   CLINICAL DATA:  Leukocytosis  EXAM: PORTABLE CHEST - 1 VIEW  COMPARISON:  03/28/2013  FINDINGS: Postsurgical changes are again seen. A right-sided jugular central line is noted in the right innominate vein stable from the prior exam. The cardiac shadow is stable. Left basilar consolidation with increasing effusion is noted.  IMPRESSION: Increasing left basilar infiltrate and effusion.   Electronically  Signed   By: Alcide CleverMark  Lukens M.D.   On: 04/01/2013 09:10   Dg Abd 2 Views  03/30/2013   CLINICAL DATA:  Colonic distention.  Multiple abdominal surgeries.  EXAM: ABDOMEN - 2 VIEW  COMPARISON:  DG ABD 2 VIEWS dated 03/29/2013; CT ABD/PELVIS W CM dated 03/28/2013  FINDINGS: Trace blunting of the right costophrenic angle. No free intraperitoneal gas.  Dilated loops of left upper quadrant small bowel measure up to 4 cm, similar to the prior exam. There are several air-fluid levels within these small bowel loops. Reduced colonic gas compared to the prior exam. IVC filter noted.   IMPRESSION: 1. Mildly dilated loops of left upper quadrant small bowel with scattered internal air-fluid levels, similar to the prior exam, with abnormal but nonspecific pattern which could be due to could ileus or partial small bowel obstruction. Correlate with bowel sounds and bowel function.   Electronically Signed   By: Herbie BaltimoreWalt  Liebkemann M.D.   On: 03/30/2013 10:43   Dg Abd 2 Views  03/29/2013   CLINICAL DATA:  Lower abdominal pain. Nausea. Shortness of breath. Weakness.  EXAM: ABDOMEN - 2 VIEW  COMPARISON:  Abdominal radiograph 03/28/2013.  FINDINGS: Some colonic gas is noted. However, there are multiple borderline dilated of mildly dilated loops of gas-filled small bowel measuring up to 4.2 cm in diameter throughout the central abdomen and left side of the abdomen. No pneumoperitoneum appreciated on the left lateral decubitus view. Several small air-fluid levels are noted. Residual iodinated contrast material is noted within the lumen of the urinary bladder related to yesterday's CT examination. Numerous surgical clips are noted in the right upper quadrant of the abdomen. IVC filter projecting over either the right side at L2-L3.  IMPRESSION: 1. Nonspecific bowel gas pattern, as above, suggestive of partial small bowel obstruction. 2. No pneumoperitoneum.   Electronically Signed   By: Trudie Reedaniel  Entrikin M.D.   On: 03/29/2013 13:38   Dg Abd Acute W/chest  03/28/2013   CLINICAL DATA:  Evaluate for a bowel obstruction.  EXAM: ACUTE ABDOMEN SERIES (ABDOMEN 2 VIEW & CHEST 1 VIEW)  COMPARISON:  None.  FINDINGS: Tracheostomy tube tip is above the carina. Heart size is mildly enlarged. The lung volumes are low. There is asymmetric elevation of the left hemidiaphragm. Left pleural effusion is noted.  Patient has an IVC filter. There are persistent abnormally dilated loops of small bowel which measure up to 5.5 cm. Gas is noted within the colon and rectum. There is no evidence of dilated bowel loops or free  intraperitoneal air. No radiopaque calculi or other significant radiographic abnormality is seen. Heart size and mediastinal contours are within normal limits. Both lungs are clear.  IMPRESSION: 1. Persistent small bowel dilatation compatible with either partial small bowel obstruction or ileus. 2. No change in aeration to the lungs compared with previous exam   Electronically Signed   By: Signa Kellaylor  Stroud M.D.   On: 03/28/2013 18:56   Dg Abd Portable 1v  04/19/2013   CLINICAL DATA:  Evaluate barium in bowel  EXAM: PORTABLE ABDOMEN - 1 VIEW  COMPARISON:  04/07/2013  FINDINGS: Oral contrast is seen in the gastric fundus. The remainder of the stomach does not show significant contrast. There is no significant barium in the larg or small bowel. Negative for bowel obstruction.  IVC filter unchanged at the L2-3 level.  IMPRESSION: Barium is localized to the gastric fundus. Negative for bowel obstruction.   Electronically Signed   By: Marlan Palauharles  Clark M.D.  On: 04/19/2013 08:05   Dg Abd Portable 1v  04/07/2013   CLINICAL DATA:  Abdominal pain.  EXAM: PORTABLE ABDOMEN - 1 VIEW  COMPARISON:  None.  FINDINGS: The bowel gas pattern is normal. IVC filter seen at the level of L3. Surgical clips from prior cholecystectomy noted. No radio-opaque calculi or other significant radiographic abnormality are seen.  IMPRESSION: No acute findings.  Unremarkable bowel gas pattern.   Electronically Signed   By: Myles Rosenthal M.D.   On: 04/07/2013 15:26   Dg Abd Portable 1v  04/04/2013   CLINICAL DATA:  Evaluate barium  EXAM: PORTABLE ABDOMEN - 1 VIEW  COMPARISON:  03/30/2013  FINDINGS: Contrast material is noted within the transverse colon and splenic flexure of the colon. Small bowel loop distention has improved. Slight prominence of left abdominal small bowel loops persists. No free air. No organomegaly. Prior cholecystectomy.  IMPRESSION: Improving small bowel distention. Small amount of oral contrast material within the transverse  colon and splenic flexure of the colon.   Electronically Signed   By: Charlett Nose M.D.   On: 04/04/2013 10:18   Dg Swallowing Func-speech Pathology  04/11/2013   Riley Nearing Deblois, CCC-SLP     04/11/2013  3:10 PM Objective Swallowing Evaluation: Modified Barium Swallowing Study   Patient Details  Name: BRICYN LABRADA MRN: [id number] Date of Birth: [birth year]  Today's Date: 04/11/2013 Time: 7425-9563 SLP Time Calculation (min): 25 min  Past Medical History:  Past Medical History  Diagnosis Date  . Hypertension   . Hypercholesteremia   . Asthma   . Meniere disease   . Persistent atrial fibrillation     a. s/p multiple dccv's;  b. failed amio;  c. not felt to be AF  RFCA canddiate due to LA dil;  d. s/p failed hybrid ablation @  [facility] in 09/2012;  e. chronic pradaxa.  . Obesity   . Biatrial enlargement     LA size 5.3cm  . Obstructive sleep apnea     AHI 108/hr now on CPAP at 12cm H2O  . H/O hiatal hernia   . GERD (gastroesophageal reflux disease)     "associated w/hiatal hernia" (12-Jun-2012)  . Peptic ulcer 1950's  . Migraines     "haven't had one for about 15 years" (06/12/12)  . Arthritis     "joints" (2012/06/12)  . Chronic lower back pain   . Nephrolithiasis ~ 1960's    "passed on their own" (2012-06-12)  . History of pneumonia 2002; 2006    "spent 8 days in isolation; Norovirus" (12-Jun-2012)  . Other and unspecified angina pectoris   . RLS (restless legs syndrome)   . Coronary artery disease     a. 05/2012 Cath/PCI: LM nl, LAD nl, LCX 108m (3.0x15 Integrity  BMS), PTCA of OM1 through stent struts (kissing balloon).  . Diabetes mellitus without complication     FAMILY STATES PATIENT IS NOT DIABETIC   Past Surgical History:  Past Surgical History  Procedure Laterality Date  . Wrist fracture surgery  1992    "repaired w/left hip bone graft" (06/12/12)  . Total knee arthroplasty  08/19/1999  . Colonoscopy w/ polypectomy  2006  . Knee arthroscopy with meniscal repair Right 1974    "medial meniscus repaired" (06/12/2012)  .  Cardioversion  10/02/2011    Procedure: CARDIOVERSION;  Surgeon: Corky Crafts, MD;   Location: [facility] ENDOSCOPY;  Service: Cardiovascular;  Laterality:  N/A;  h/p in file drawer  . Cardioversion  11/07/2011  Procedure: CARDIOVERSION;  Surgeon: Corky Crafts, MD;   Location: [facility] ENDOSCOPY;  Service: Cardiovascular;  Laterality:  N/A;  h/p from 10/22 in file drawer/dl  . Cardiac catheterization  05/26/2012  . Coronary angioplasty with stent placement  06/04/2012    "1" (06/04/2012)  . Cataract extraction w/ intraocular lens  implant, bilateral   2009  . Hemorrhoid surgery  ~ 2003  . Hiatal hernia repair  1983  . Nissen fundoplication  1983  . Ablation of dysrhythmic focus  SEPT/OCT 2014    ATRIAL FIB  . Inguinal hernia repair Bilateral 2000 and 2005    "one done at a time" (06/04/2012)  . Colon surgery  10/08/2012   . Tracheostomy  OCT 2014   HPI:  Pt is 79 yo male with fairly recent hospitalization at [facility] in  September 2014 for failed maize procedure and during that  hospitalization pt developed volvulus and has required right  colectomy (10/08/2012), subsequently 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 Pt was in  prolonged respiratory failure at [facility] unable to wean of the vent  and requiring trach placement 10/29/2012. He was transferred to  [facility] in December 2014 and later to [facility] in late January  2015. He was on TNA and was doing fairly well initially, but  developed more abdominal pain and on 2/23 taken to OR in [place]  for gallbladder removal (secondary to cholecystitis), resection  for part of the colon for fistula, IVC filter placement. Sent  back to [facility] on march 4th, 2015 and started on TPN. Diet  advanced on March 9th, 2015 after pt passed swallow evaluation.  He was brought to [facility] March 16th, after noticing progressive  abdominal distension. In ED, CT abdomen with air- fluid level and  findings consistent with early partial SBO, ileus. Surgery  reports  pt is ok for PO from their standpoint, SLP ordered for  swallow eval.      Assessment / Plan / Recommendation Clinical Impression  Dysphagia Diagnosis: Moderate pharyngeal phase dysphagia Clinical impression: Pt demonstrates significant improvement in  motor function since last MBS. Pts primary decifits are now  exclusively sensory in nature. Pt has a delay in swallow  initiation with nectar and thin teaspoon amounts with aspiration  before the swallow. With a chin tuck, the pt is albe to protect  the airway before the swallow with nectar vea teaspoon or straw.  Pt is also able to masticate soft and regular textured solids. He  was noted to have expectoration of mucous/barium after the test,  possible esophageal component not visualized due to body habitus.  Pt is recommended to upgrade to a dys 2 Finely chopped diet with  nectar thick liquids via spoon or straws with full supervison for  a chin tuck and placement of PMSV. Pt may upgrade solids if he  tolerates dys 2 diet.     Treatment Recommendation  Therapy as outlined in treatment plan below    Diet Recommendation Dysphagia 2 (Fine chop);Nectar-thick liquid   Liquid Administration via: Spoon;Straw Medication Administration: Crushed with puree Supervision: Patient able to self feed;Full supervision/cueing  for compensatory strategies Compensations: Slow rate;Small sips/bites Postural Changes and/or Swallow Maneuvers: Chin tuck;Seated  upright 90 degrees    Other  Recommendations Oral Care Recommendations: Oral care BID Other Recommendations: Place PMSV during PO intake;Order  thickener from pharmacy;Have oral suction available   Follow Up Recommendations  Skilled Nursing facility    Frequency and Duration min 2x/week  2 weeks  Pertinent Vitals/Pain NA    SLP Swallow Goals     General HPI: Pt is 79 yo male with fairly recent hospitalization  at [facility] in September 2014 for failed maize procedure and during  that hospitalization pt developed volvulus and has required  right  colectomy (10/08/2012), subsequently 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 Pt was in  prolonged respiratory failure at [facility], unable to wean of the vent  and requiring trach placement 10/29/2012. He was transferred to  [facility] in December 2014 and later to [facility] in late January  2015. He was on TNA and was doing fairly well initially, but  developed more abdominal pain and on 2/23 taken to OR in [place]  for gallbladder removal (secondary to cholecystitis), resection  for part of the colon for fistula, IVC filter placement. Sent  back to [facility] on march 4th, 2015 and started on TPN. Diet  advanced on March 9th, 2015 after pt passed swallow evaluation.  He was brought to [facility] March 16th, after noticing progressive  abdominal distension. In ED, CT abdomen with air- fluid level and  findings consistent with early partial SBO, ileus. Surgery  reports pt is ok for PO from their standpoint, SLP ordered for  swallow eval.  Type of Study: Modified Barium Swallowing Study Reason for Referral: Objectively evaluate swallowing function Previous Swallow Assessment: [facility] hospital - 3/16 pt "passed"  FEES and was started on diet, began to decline and blue dye test  was given, pt silently aspirated thin, nectar, honey per report.  Diet Prior to this Study: Dysphagia 1 (puree);Pudding-thick  liquids Temperature Spikes Noted: No Respiratory Status: Trach collar Trach Size and Type: Cuff;Deflated;With PMSV in place;#6;Other  (Comment) History of Recent Intubation: No Behavior/Cognition: Alert;Cooperative;Requires cueing;Hard of  hearing Oral Cavity - Dentition: Missing dentition Oral Motor / Sensory Function: Impaired - see Bedside swallow  eval Self-Feeding Abilities: Able to feed self;Needs assist Patient Positioning: Upright in chair Baseline Vocal Quality: Low vocal intensity Volitional Cough: Strong Volitional Swallow: Able to elicit Anatomy: Within functional limits Pharyngeal  Secretions: Not observed secondary MBS    Reason for Referral Objectively evaluate swallowing function   Oral Phase Oral Preparation/Oral Phase Oral Phase: Impaired Oral Phase - Comment Oral Phase - Comment: missing dentition, but places boluse  effectively between remaining teeth. otherwise WFL.    Pharyngeal Phase Pharyngeal Phase Pharyngeal Phase: Impaired Pharyngeal - Honey Pharyngeal - Honey Teaspoon: Not tested Pharyngeal - Honey Cup: Not tested Pharyngeal - Nectar Pharyngeal - Nectar Teaspoon: Delayed swallow  initiation;Penetration/Aspiration before swallow (chin tuck  increases airway closure, preents penetration) Penetration/Aspiration details (nectar teaspoon): Material enters  airway, passes BELOW cords without attempt by patient to eject  out (silent aspiration);Material does not enter airway Pharyngeal - Nectar Straw: Delayed swallow initiation (chin tuck) Pharyngeal - Thin Pharyngeal - Thin Teaspoon: Delayed swallow  initiation;Penetration/Aspiration before swallow (chin tuck) Penetration/Aspiration details (thin teaspoon): Material enters  airway, CONTACTS cords and not ejected out Pharyngeal - Solids Pharyngeal - Puree: Delayed swallow initiation (chin tuck) Pharyngeal - Mechanical Soft: Delayed swallow initiation Pharyngeal - Regular: Delayed swallow initiation  Cervical Esophageal Phase    GO             Harlon Ditty, MA CCC-SLP [phone]  Claudine Mouton 04/11/2013, 3:08 PM    Dg Swallowing Func-speech Pathology  04/02/2013   Breck Coons [place], CCC-SLP     04/02/2013  2:03 PM Objective Swallowing Evaluation: Modified Barium Swallowing Study   Patient Details  Name: ANSELM AUMILLER MRN: [id number] Date of Birth: 1934/07/20  Today's Date: 04/02/2013 Time: 1225-1250 SLP Time Calculation (min): 25 min  Past Medical History:  Past Medical History  Diagnosis Date  . Hypertension   . Hypercholesteremia   . Asthma   . Meniere disease   . Persistent atrial fibrillation     a. s/p multiple dccv's;   b. failed amio;  c. not felt to be AF  RFCA canddiate due to LA dil;  d. s/p failed hybrid ablation @  [facility] in 09/2012;  e. chronic pradaxa.  . Obesity   . Biatrial enlargement     LA size 5.3cm  . Obstructive sleep apnea     AHI 108/hr now on CPAP at 12cm H2O  . H/O hiatal hernia   . GERD (gastroesophageal reflux disease)     "associated w/hiatal hernia" (06-11-12)  . Peptic ulcer 1950's  . Migraines     "haven't had one for about 15 years" (Jun 11, 2012)  . Arthritis     "joints" (Jun 11, 2012)  . Chronic lower back pain   . Nephrolithiasis ~ 1960's    "passed on their own" (11-Jun-2012)  . History of pneumonia 2002; 2006    "spent 8 days in isolation; Norovirus" (Jun 11, 2012)  . Other and unspecified angina pectoris   . RLS (restless legs syndrome)   . Coronary artery disease     a. 05/2012 Cath/PCI: LM nl, LAD nl, LCX 8m (3.0x15 Integrity  BMS), PTCA of OM1 through stent struts (kissing balloon).  . Diabetes mellitus without complication     FAMILY STATES PATIENT IS NOT DIABETIC   Past Surgical History:  Past Surgical History  Procedure Laterality Date  . Wrist fracture surgery  1992    "repaired w/left hip bone graft" (06/11/2012)  . Total knee arthroplasty  08/19/1999  . Colonoscopy w/ polypectomy  2006  . Knee arthroscopy with meniscal repair Right 1974    "medial meniscus repaired" (11-Jun-2012)  . Cardioversion  10/02/2011    Procedure: CARDIOVERSION;  Surgeon: Corky Crafts, MD;   Location: [facility] ENDOSCOPY;  Service: Cardiovascular;  Laterality:  N/A;  h/p in file drawer  . Cardioversion  11/07/2011    Procedure: CARDIOVERSION;  Surgeon: Corky Crafts, MD;   Location: [facility] ENDOSCOPY;  Service: Cardiovascular;  Laterality:  N/A;  h/p from 10/22 in file drawer/dl  . Cardiac catheterization  05/26/2012  . Coronary angioplasty with stent placement  06/11/2012    "1" (2012-06-11)  . Cataract extraction w/ intraocular lens  implant, bilateral   2009  . Hemorrhoid surgery  ~ 2003  . Hiatal hernia repair  1983  . Nissen  fundoplication  1983  . Ablation of dysrhythmic focus  SEPT/OCT 2014    ATRIAL FIB  . Inguinal hernia repair Bilateral 2000 and 2005    "one done at a time" (06/11/2012)  . Colon surgery  10/08/2012   . Tracheostomy  OCT 2014   HPI:  Pt is 79 yo male with fairly recent hospitalization at [facility] in  September 2014 for failed maize procedure and during that  hospitalization pt developed volvulus and has required right  colectomy (10/08/2012), subsequently 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 Pt was in  prolonged respiratory failure at [facility], unable to wean of the vent  and requiring trach placement 10/29/2012. He was transferred to  [facility] in December 2014 and later to [facility] in late January  2015. He was on TNA and was doing fairly well initially,  but  developed more abdominal pain and on 2/23 taken to OR in [place]  for gallbladder removal (secondary to cholecystitis), resection  for part of the colon for fistula, IVC filter placement. Sent  back to [facility] on march 4th, 2015 and started on TPN. Diet  advanced on March 9th, 2015 after pt passed swallow evaluation.  He was brought to [facility] March 16th, after noticing progressive  abdominal distension. In ED, CT abdomen with air- fluid level and  findings consistent with early partial SBO, ileus. Surgery  reports pt is ok for PO from their standpoint, SLP ordered for  swallow eval.      Assessment / Plan / Recommendation Clinical Impression  Dysphagia Diagnosis: Moderate pharyngeal phase dysphagia;Severe  pharyngeal phase dysphagia Clinical impression: MBS completed with pt's PMSV donned and  appears to be congruent with study performed at [facility].  He  demonstrated moderate-severe motor based pharyngeal dyspahgia as  evidenced by reduced tongue base retraction, decreased laryngeal  elevation and decreased pharyngeal contraction.  Impairments led  to mild pharyngeal residue in vallecule/pyriform sinuses and  posterior pharyngeal wall  and laryngeal vestibule consistently  silent penetration to vocal cords.  Verbal cues for hard cough  briefly cleared vestibule with subsequent penetration from  residue.  SLP recommends Dys 1 (puree) only, no liquids (pudding  thick liquid) with speaking valve donned, two swallows,  volitional coughs, pills crushed in applesauce and full  supervision/assist.       Treatment Recommendation  Therapy as outlined in treatment plan below    Diet Recommendation Dysphagia 1 (Puree);Pudding-thick liquid   Medication Administration: Crushed with puree Supervision: Patient able to self feed;Full supervision/cueing  for compensatory strategies Compensations: Slow rate;Small sips/bites;Multiple dry swallows  after each bite/sip;Hard cough after swallow Postural Changes and/or Swallow Maneuvers: Seated upright 90  degrees;Upright 30-60 min after meal, wear speaking valve during  all po's    Other  Recommendations Oral Care Recommendations: Oral care BID Other Recommendations: Order thickener from pharmacy   Follow Up Recommendations  Skilled Nursing facility    Frequency and Duration min 2x/week  2 weeks   Pertinent Vitals/Pain WDL            Reason for Referral Objectively evaluate swallowing function   Oral Phase Oral Preparation/Oral Phase Oral Phase: WFL (WFL for textures assessed)   Pharyngeal Phase Pharyngeal Phase Pharyngeal Phase: Impaired Pharyngeal - Honey Pharyngeal - Honey Teaspoon: Reduced pharyngeal  peristalsis;Pharyngeal residue - valleculae;Reduced tongue base  retraction;Penetration/Aspiration during swallow;Pharyngeal  residue - pyriform sinuses;Reduced laryngeal elevation Penetration/Aspiration details (honey teaspoon): Material enters  airway, CONTACTS cords and not ejected out Pharyngeal - Honey Cup: Penetration/Aspiration during  swallow;Pharyngeal residue - valleculae;Pharyngeal residue -  pyriform sinuses;Reduced tongue base retraction;Reduced laryngeal  elevation Penetration/Aspiration details (honey  cup): Material enters  airway, remains ABOVE vocal cords then ejected out Pharyngeal - Solids Pharyngeal - Puree: Pharyngeal residue - valleculae;Reduced  tongue base retraction;Pharyngeal residue - pyriform  sinuses;Reduced laryngeal elevation  Cervical Esophageal Phase        Cervical Esophageal Phase Cervical Esophageal Phase: [facility]         Darrow Bussing.Ed CCC-SLP Pager [phone]  04/02/2013         Subjective: Patient complains of his chronic abdominal pain which is not worse than usual. Last bowel movement today. No vomiting. He is slightly more short of breath today. He denies any headache, chest pain, fevers, chills.  Objective: Filed Vitals:   04/22/13 1478 04/22/13 0600 04/22/13 0715 04/22/13 2956  BP: 99/55 86/44 100/43   Pulse: 67 62  64  Temp:    98.9 F (37.2 C)  TempSrc:    Oral  Resp: 15 19  19   Height:      Weight:      SpO2: 100% 99%  99%    Intake/Output Summary (Last 24 hours) at 04/22/13 0837 Last data filed at 04/22/13 0700  Gross per 24 hour  Intake 2165.33 ml  Output      0 ml  Net 2165.33 ml   Weight change: -3.7 kg (-8 lb 2.5 oz) Exam:   General:  Arousable, following commands  HEENT: No icterus, No thrush, [place]/AT  Cardiovascular: RRR, S1/S2, no rubs, no gallops  Respiratory: improved lung exam with decreased crackles compared yesterday's exam  Abdomen: Soft/+BS, non tender, non distended, no guarding  Extremities: trace LE edema, No lymphangitis, No petechiae, No rashes, no synovitis  Data Reviewed: Basic Metabolic Panel:  Recent Labs Lab 04/16/13 0512  04/18/13 0622 04/19/13 0620 04/20/13 0353 04/21/13 0300 04/22/13 0311  NA 145  < > 142 141 140 140 140  K 3.6*  < > 4.1 4.4 5.1 3.5* 2.5*  CL 106  < > 104 103 102 101 96  CO2 28  < > 26 25 28 26 28   GLUCOSE 171*  < > 166* 166* 212* 119* 106*  BUN 16  < > 19 20 24* 34* 30*  CREATININE 0.62  < > 0.74 0.65 0.75 0.95 0.83  CALCIUM 8.4  < > 8.5 8.6 8.9 8.7 8.3*  MG 2.0  --   2.1  --   --  1.9 1.8  PHOS 3.5  --  3.8  --   --  2.4 2.7  < > = values in this interval not displayed. Liver Function Tests:  Recent Labs Lab 04/16/13 0512 04/18/13 0622 04/21/13 0300  AST 56* 33 19  ALT 69* 49 26  ALKPHOS 80 74 66  BILITOT 0.3 <0.2* 0.3  PROT 4.9* 5.1* 5.1*  ALBUMIN 2.1* 2.2* 2.3*   No results found for this basename: LIPASE, AMYLASE,  in the last 168 hours No results found for this basename: AMMONIA,  in the last 168 hours CBC:  Recent Labs Lab 04/18/13 0622 04/19/13 0620 04/20/13 0353 04/21/13 0300 04/22/13 0311  WBC 8.6 8.7 11.2* 10.8* 8.8  NEUTROABS 6.4  --   --   --   --   HGB 8.1* 7.7* 8.2* 7.4* 7.5*  HCT 26.4* 24.6* 26.4* 23.4* 23.9*  MCV 96.0 95.0 96.7 94.0 92.3  PLT 121* 117* 149* 151 150   Cardiac Enzymes: No results found for this basename: CKTOTAL, CKMB, CKMBINDEX, TROPONINI,  in the last 168 hours BNP: No components found with this basename: POCBNP,  CBG:  Recent Labs Lab 04/21/13 1124 04/21/13 1707 04/21/13 2014 04/22/13 0003 04/22/13 0352  GLUCAP 165* 127* 169* 122* 127*    Recent Results (from the past 240 hour(s))  URINE CULTURE     Status: None   Collection Time    04/13/13  2:31 PM      Result Value Ref Range Status   Specimen Description URINE, RANDOM   Final   Special Requests NONE   Final   Culture  Setup Time     Final   Value: 04/13/2013 18:52     Performed at [facility] Count     Final   Value: 8,000 COLONIES/ML     Performed at [facility]  Culture     Final   Value: INSIGNIFICANT GROWTH     Performed at [facility]   Report Status 04/14/2013 FINAL   Final  CLOSTRIDIUM DIFFICILE BY PCR     Status: Abnormal   Collection Time    04/16/13  4:44 PM      Result Value Ref Range Status   C difficile by pcr POSITIVE (*) NEGATIVE Final   Comment: CRITICAL RESULT CALLED TO, READ BACK BY AND VERIFIED WITH:     M.CROSSON,RN 1806 04/16/13 M.CAMPBELL     Scheduled  Meds: . amiodarone  200 mg Oral BID  . antiseptic oral rinse  15 mL Mouth Rinse QID  . atorvastatin  40 mg Oral Daily  .  ceFAZolin (ANCEF) IV  2 g Intravenous Once  . chlorhexidine  15 mL Mouth Rinse BID  . diltiazem  120 mg Oral TID WC & HS  . hydrocortisone cream   Topical BID  . insulin aspart  0-15 Units Subcutaneous 6 times per day  . metoprolol  2.5 mg Intravenous 4 times per day  . metoprolol tartrate  12.5 mg Oral BID  . metronidazole  500 mg Intravenous Q8H  . potassium chloride  10 mEq Intravenous Q1 Hr x 6  . potassium chloride  10 mEq Intravenous Q1 Hr x 3  . potassium chloride      . sodium chloride  10-40 mL Intracatheter Q12H   Continuous Infusions: . sodium chloride 10 mL/hr at 04/20/13 1200  . Marland KitchenTPN (CLINIMIX-E) Adult     And  . fat emulsion    . Marland KitchenTPN (CLINIMIX-E) Adult 83 mL/hr at 04/21/13 1749   And  . fat emulsion 250 mL (04/21/13 1745)     Jeralyn Bennett, DO  [facility] Pager [phone]  If 7PM-7AM, please contact night-coverage [email or web address] Password [facility] 04/22/2013, 8:37 AM   LOS: 25 days

## 2013-04-22 NOTE — Progress Notes (Signed)
eLink Physician-Brief Progress Note Patient Name: Tim Short DOB: 01/04/1935 MRN: [id number]  Date of Service  04/22/2013   HPI/Events of Note     eICU Interventions  Hypokalemia -repleted    Intervention Category Intermediate Interventions: Electrolyte abnormality - evaluation and management  Oretha MilchRakesh V Alva 04/22/2013, 5:37 AM

## 2013-04-22 NOTE — Progress Notes (Signed)
SLP Cancellation Note  Patient Details Name: Rush BarerJames B Scheid MRN: [id number] DOB: 10/11/1934   Cancelled treatment:       Reason Eval/Treat Not Completed: Medical issues which prohibited therapy. Pt remains on vent support. Possible PEG.    Riley NearingBonnie Caroline Brandii Lakey 04/22/2013, 12:30 PM

## 2013-04-22 NOTE — Progress Notes (Signed)
CRITICAL VALUE ALERT  Critical value received:  Potassium 2.5  Date of notification: 04/22/2013  Time of notification:  0530  Critical value read back:yes  Nurse who received alert:  Anselm LisMelvin kuffour  MD notified (1st page): Pccm( RN Marcelle SmilingNatasha)  Time of first page: [phone]  MD notified (2nd page):  Time of second page:  Responding MD:  pccm  Time MD responded:  [phone]

## 2013-04-22 NOTE — Progress Notes (Signed)
OT Cancellation Note  Patient Details Name: Tim Short MRN: [id number] DOB: 06/30/1934   Cancelled Treatment:     Pt currently in interventional radiology for PEG placement.  Evette GeorgesCatherine Eva Kazi Montoro [phone] 04/22/2013, 12:58 PM

## 2013-04-22 NOTE — Progress Notes (Signed)
Called pt's son and introduced self and explained role. Pt getting PEG tube placed today, so son hopeful pt would be eligible for LTACH. Son understanding of CSW role in care plan and appreciative of call.   Maryclare LabradorJulie Raejean Swinford, MSW, [facility]rin[facility] For ChildrenCSWA Clinical Social Worker [phone]

## 2013-04-23 LAB — BASIC METABOLIC PANEL
BUN: 28 mg/dL — ABNORMAL HIGH (ref 6–23)
CHLORIDE: 97 meq/L (ref 96–112)
CO2: 25 meq/L (ref 19–32)
CREATININE: 0.8 mg/dL (ref 0.50–1.35)
Calcium: 8.1 mg/dL — ABNORMAL LOW (ref 8.4–10.5)
GFR calc non Af Amer: 84 mL/min — ABNORMAL LOW (ref >90)
Glucose, Bld: 717 mg/dL (ref 70–99)
Potassium: 4 mEq/L (ref 3.7–5.3)
SODIUM: 136 meq/L — AB (ref 137–147)

## 2013-04-23 LAB — CBC
HEMATOCRIT: 22 % — AB (ref 39.0–52.0)
HEMOGLOBIN: 7.2 g/dL — AB (ref 13.0–17.0)
MCH: 30.8 pg (ref 26.0–34.0)
MCHC: 32.7 g/dL (ref 30.0–36.0)
MCV: 94 fL (ref 78.0–100.0)
Platelets: 134 10*3/uL — ABNORMAL LOW (ref 150–400)
RBC: 2.34 MIL/uL — AB (ref 4.22–5.81)
RDW: 19.8 % — ABNORMAL HIGH (ref 11.5–15.5)
WBC: 6.5 10*3/uL (ref 4.0–10.5)

## 2013-04-23 LAB — PHOSPHORUS: PHOSPHORUS: 4.8 mg/dL — AB (ref 2.3–4.6)

## 2013-04-23 LAB — MAGNESIUM: Magnesium: 2 mg/dL (ref 1.5–2.5)

## 2013-04-23 LAB — GLUCOSE, CAPILLARY
GLUCOSE-CAPILLARY: 165 mg/dL — AB (ref 70–99)
GLUCOSE-CAPILLARY: 185 mg/dL — AB (ref 70–99)
Glucose-Capillary: 147 mg/dL — ABNORMAL HIGH (ref 70–99)
Glucose-Capillary: 163 mg/dL — ABNORMAL HIGH (ref 70–99)
Glucose-Capillary: 179 mg/dL — ABNORMAL HIGH (ref 70–99)
Glucose-Capillary: 131 mg/dL — ABNORMAL HIGH (ref 70–99)

## 2013-04-23 MED ORDER — FAT EMULSION 20 % IV EMUL
250.0000 mL | INTRAVENOUS | Status: DC
  Filled 2013-04-23 (×2): qty 250

## 2013-04-23 MED ORDER — DABIGATRAN ETEXILATE MESYLATE 150 MG PO CAPS: 150.0000 mg | ORAL_CAPSULE | Freq: Two times a day (BID) | ORAL | Status: DC

## 2013-04-23 MED ORDER — VITAL HIGH PROTEIN PO LIQD
1000.0000 mL | ORAL | Status: DC
  Administered 2013-04-23 – 2013-04-25 (×2): 1000 mL
  Filled 2013-04-23 (×4): qty 1000

## 2013-04-23 MED ORDER — RIVAROXABAN 20 MG PO TABS
20.0000 mg | ORAL_TABLET | Freq: Every day | ORAL | Status: DC
  Administered 2013-04-23 – 2013-05-03 (×11): 20 mg via ORAL
  Filled 2013-04-23 (×12): qty 1

## 2013-04-23 MED ORDER — FAMOTIDINE 200 MG/20ML IV SOLN
INTRAVENOUS | Status: DC
  Filled 2013-04-23 (×2): qty 2000

## 2013-04-23 NOTE — Progress Notes (Signed)
PROGRESS NOTE  Tim Short:[id number] DOB: 1934/10/07 DOA: 03/28/2013 PCP:  Duane Lope, MD Interim History Pt is 79 yo male with fairly recent hospitalization at [facility] in September 2014 for failed maize procedure and during that hospitalization pt developed volvulus and has required right colectomy (10/08/2012), subsequently developed multiple abscesses and enteric fistulas managed percutaneous drains. Pt was in prolonged respiratory failure at [facility], unable to wean of the vent and requiring trach placement 10/29/2012. He was transferred to [facility] in December 2014 and later to [facility] in late January 2015. He was on TNA and was doing fairly well initially, but developed more abdominal pain and on 2/23 taken to OR in [place] for gallbladder removal (secondary to cholecystitis), resection for part of the colon for fistula, IVC filter placement. Sent back to [facility] on 03/16/13 and started on TPN. Diet advanced on March 9th, 2015 after pt passed swallow evaluation. He was brought to [facility] March 16th, after noticing progressive abdominal distension. In ED, CT abdomen with air- fluid level and findings consistent with early partial SBO, ileus.  Patient admitted to [facility] service 03/29/2013 with surgical consult, his SBO slowly resolved and surgery signed off 3/27. Throughout his hospitalization patient developed Atrial fibrillation with RVR to rated of 130s-140s, stable, and cardiology was consulted. Patient had several up titration of his medications including addition of Amiodarone, and was d/c cardioverted on 3/27. He maintained sinus rhythm through 3/28-3/29 with plans for discharge on 3/31, however on 3/30 went back into A fib with RVR and cardiology has been re consulted.   Electrophysiology was consulted and the patient's Cardizem CD was increased to 240 mg twice a day. His heart rate improved from 130s to 120s. Metoprolol tartrate was started with improvement of the heart rate to 110s. Cardiology has  signed off but may need to be reconsulted. The patient was initially started on TPN with hopes that this would be temporary. However there is no significant improvement in the patient's nutritional or clinical status. As a result, IR was consulted for gastrostomy tube placement. It is hopeful that this will be placed on 04/20/2013. He was initially scheduled for 04/19/2013, but contrast transit beyond the stomach was slow.  The patient's insurance has denied LTAC transfer.  Case management is working on appealing this as well as the patient's son. In the last 24 hours, the patient has an increasing frothy tracheal secretions. Chest x-ray revealed increasing vascular congestion. The patient was started on intravenous Lasix.   Patient seems a little better today, he is able to nod appropriately to my questions, following commands, awake and alert. Remains on vent support.   Assessment/Plan: Acute on Chronic respiratory failure  -ABG showing pH of 7.12 with PCO2 of 92.1, transferred to ICU placed on vent support  -May be due to volume overload, was given lasix 40mg  IV BID. -Currently on 40% FiO2, lung exam improved -Plan to wean vent  Atrial fibrillation/flutter  -s/p cardioversion 3/27, restored sinus rhythm and maintained over the weekend -04/11/13 am--back in A fib with RVR, rates in the 130s.  -Cardiology reconsulted -continue amiodarone 200mg  bid per EP  -Defer chronotropic control to cardiology--diltiazem CD increased to 240bid  -HR's in the 60's to 70's now -add metoprolol tartrate 12.5 mg by mouth daily with HR 100-110 -on IV heparin   Clostridium difficile colitis  -Flagyl started 04/16/2013  -Partly contributing to abdominal pain  -fewer BMs, but still loose stools   Abdominal  pain  - secondary to ileus, partial SBO as observed on admission as well as Clostridium difficile colitis presently  -ileus/SBO resolved, patient with bowel movements  -has a degree of abdominal pain which is  stable.  - continue supportive care with analgesia, antiemetics as needed for symptom control.  -Consulted Surgery for PEG, felt could not safely be done with distorted anatomy, multiple complications, Cdiff infection.  -TPN initiated 3/26 and that can be weaned off if po intake improves and pt improves clinically  -long discussion with son regarding risks and benefits of enteral feeding vs TPN--he is amenable to gastrostomy tube placement  -S/P PEG placement by IR on 04/22/13  Moderate malnutrition  - secondary to acute on chronic illness as outlined above  - pt tolerating well however does not seem to meet his nutritional requirements per nutrition  - on TPN, appreciate nutrition and pharmacy consults.  -Surgery consulted for PEG tube placement, felt that with his multiple complications, could not safely place a PEG at this time.  -IR placed PEG on 04/22/13  Thrombocytopenia  -Suspect myelosuppression due to acute medical illness--stable, slowly improving  -Check serum B12--558  -RBC folate--950  -Peripheral smear--no schistocytes  -Fibrinogen--355  -INR 1.14   Leukocytosis  - likely secondary to ileus, HCAP as suggestive per CXR 3/20  - CT abd/pelvis with mild leak around anastomosis but no other acute events, and surgery signed off  - leukocytosis has now resolved   Dysuria  -UA neg for pyuria   HCAP -  -Finished one week cefepime/levoflox and vancomycin 04/07/2013  -Remains afebrile without any respiratory distress   Acute on chronic diastolic CHF - monitoring I's/O's, daily weights  - weight trend: 191 >>204 -start Lasix 40mg  IV bid - repeat CXR 4/7-->increase vascular congestion  Hypokalemia - continue supplementation. Magnesium normal.   Sacral ulcer - skin breakdown noted, closely monitor. Nursing care. No evidence of infection.   Anemia of chronic disease - no signs of active bleeding. Likely multifactorial ?medication induced, nutritional deficiencies. Closely  monitor.   Essential hypertension -  -controlled   Tracheostomy status  -pt requires frequent suctioning--will benefit going to LTAC  -Has had numerous episodes of mucous plugging requiring aggressive suctioning  -concerned about ability to provide level of care he needs at [facility]  - Patient with a desaturation event 3/30 evening requiring suctioning.  -pulmonary toilet   Hypernatremia - TPN per pharmacy. Improving, supplemented 3/30 with D5W.  Family Communication: Son notified of change in patient's status and transfer to ICU request made Disposition Plan: [name] vs [facility]          Procedures/Studies: Ct Abdomen Pelvis W Contrast  04/04/2013   CLINICAL DATA:  Liver flight non.  Leukocytosis.  EXAM: CT ABDOMEN AND PELVIS WITH CONTRAST  TECHNIQUE: Multidetector CT imaging of the abdomen and pelvis was performed using the standard protocol following bolus administration of intravenous contrast.  CONTRAST:  90mL OMNIPAQUE IOHEXOL 300 MG/ML  SOLN  COMPARISON:  DG ABD PORTABLE 1V dated 04/04/2013; CT ABD/PELVIS W CM dated 03/28/2013; CT ABD-PELV W/O CM dated 03/11/2013; CT ABD/PELVIS W CM dated 01/30/2013  FINDINGS: Stable moderate left and small right pleural effusions with associated passive atelectasis. Mild cardiomegaly. Postoperative findings along the gastroesophageal junction.  Slightly reduce marginal definition of the infiltrative hypodense process involving the right hepatic lobe and portions of segment 4 of the liver.  The spleen, adrenal glands, and pancreas unremarkable. Small amount of fluid along the gallbladder fossa and along the inferior  edge of the right hepatic lobe could, similar to prior. Gallbladder surgically absent.  There is contrast medium in the renal collecting systems on the initial portal venous phase images, likely due to inadvertent early injection.  Stable renal hypodensities. Infrarenal IVC filter. Continued fluid along the inferior margin of the laparotomy  wound. Trace fluid in the lower omentum. Stable trace presacral edema.  Bilateral chronic pars defects at L5 observed with 9 mm of anterolisthesis.  Orally administered contrast extends through to the rectum. Mild circumferential rectal wall thickening.  The patient seems to have had right hemicolectomy, with reanastomosis to the small bowel on image 39 of series 5. Outpouching from the proximal terminal margin of the remaining colon shown on images 32 through 21 of series 5, potentially some type of postoperative diverticulum or contained leak along the stable site.  IMPRESSION: 1. Similar distribution but reduced conspicuity of the peripheral infiltrative hepatic hypodensity affecting the right hepatic lobe an adjacent portion of segment 4. Fatty infiltration is the most common cause for and infiltrative hypodensity of this type, although localized parenchymal inflammation is not entirely excluded. MRI could differentiate if clinically warranted. 2. Small amount of perihepatic ascites, similar to prior. 3. Hypodense renal lesions, similar to prior. 4. Patient appears to have had right hemicolectomy with small bowel reanastomosis. There is a collection of gas and contrast extending to the right of the proximal terminal margin of the remaining colon, potentially a small contained leak along the staple line. 5. Continued fluid along the inferior margin of the laparotomy wound, but without internal gas density currently.   Electronically Signed   By: Herbie Baltimore M.D.   On: 04/04/2013 18:40   Ct Abdomen Pelvis W Contrast  03/28/2013   CLINICAL DATA:  Abdominal distention.  EXAM: CT ABDOMEN AND PELVIS WITH CONTRAST  TECHNIQUE: Multidetector CT imaging of the abdomen and pelvis was performed using the standard protocol following bolus administration of intravenous contrast.  CONTRAST:  OMNIPAQUE IOHEXOL 300 MG/ML  SOLN  COMPARISON:  DG ABD ACUTE W/CHEST dated 03/28/2013; CT ABD-PELV W/O CM dated 03/11/2013;  CT ABD/PELVIS W CM dated 01/30/2013  FINDINGS: There is a new lucency is again noted in the liver. This remains unchanged in appearance and may be infectious in etiology. No clearcut fluid collection in or air collection is noted in the liver to suggest a definite abscess. These changes may be related to phlegmon. Close follow up to evaluate for developing abscess suggested. Hepatic veins patent. Splenic and portal veins patent. No focal significant splenic abnormality. Pancreas normal. Cholecystectomy. No biliary distention.  Adrenals normal. Stable approximate 13 mm low-density lesion in the left kidney, not definite simple cyst. As noted on prior study, MRI of the kidneys suggest for further evaluation. No hydronephrosis. The bladder is nondistended. No free pelvic fluid. Prostate is not enlarged. Calcifications within the prostate. No significant adenopathy. Abdominal aorta is atherosclerotic. No aneurysm. Visceral vessels are patent. Inferior vena caval filter noted in the IVC with tip just below the renal veins.  Previously identified drainage catheter in the right upper quadrant has been removed. Inflammatory changes are noted in the right mid abdomen, no abscess noted. The patient has had a prior hemicolectomy. Reference is made to CT report of 01/31/2012 which discusses fistula changes within the abdomen. Patient status post right hemicolectomy. The colon is moderately distended and contains fluid levels. These changes have improved from prior exam suggesting improving ileus. These changes may be related to a diarrheal illness. Follow-up  abdominal series suggested. No small bowel distention. The stomach is nondistended. No free air noted.  Atelectasis and/or infiltrates in the lung bases. Bilateral pleural effusions. Cardiomegaly. Coronary artery disease. Midline abdominal scar noted. Fluid noted in the region of the scar with associated small amount of air. Developing abscess should be considered. These  findings have progressed slightly from prior study. Multiple anterior abdominal wall subcutaneous nodules most likely injection granulomas.  IMPRESSION: 1. Findings consistent with persistent phlegmon in the liver. 2. Interim removal of right upper quadrant drainage catheter. 3. Colonic distention with air-fluid levels. These findings have improved from prior study suggesting improving adynamic ileus or diarrheal illness. Followup abdominal series suggested. 4. Slight progression of soft tissue fluid in the anterior abdomen in the region the patient's scarring. This could represent developing phlegmon/abscess. Small amount of air is noted within this fluid collection. 5. 13 mm low-density lesion left kidney, not definite simple cyst. As noted on prior study nonenhanced and enhanced MRI of the kidneys suggested for further evaluation.   Electronically Signed   By: Maisie Fus  Register   On: 03/28/2013 23:29   Dg Chest Port 1 View  04/19/2013   CLINICAL DATA:  Chronic respiratory failure and worsening shortness of Breath  EXAM: PORTABLE CHEST - 1 VIEW  COMPARISON:  04/10/2013  FINDINGS: A left-sided PICC line, tracheostomy tube and postsurgical changes are again seen and stable. The cardiac shadow is stable. Left-sided pleural effusion is again identified as well as left retrocardiac atelectasis. No focal chondral no other focal infiltrate is seen.  IMPRESSION: Stable changes in the left base.  No new focal abnormality is noted.   Electronically Signed   By: Alcide Clever M.D.   On: 04/19/2013 12:27   Dg Chest Port 1 View  04/10/2013   CLINICAL DATA:  Cough and congestion.  EXAM: PORTABLE CHEST - 1 VIEW  COMPARISON:  DG CHEST 1V PORT dated 04/03/2013  FINDINGS: Tracheostomy and left-sided PICC line positioning are stable. The right jugular central line has been removed since the prior chest x-ray. Lungs show lower volumes bilaterally with increased prominence of bilateral lower lobe atelectasis. There may be a  component of left-sided pleural fluid. The heart remains moderately enlarged with evidence of prior placement of a left atrial appendage clip. No overt pulmonary edema is identified.  IMPRESSION: Lower volumes with increased prominence of bilateral lower lobe atelectasis. There may be a component of left-sided pleural fluid.   Electronically Signed   By: Irish Lack M.D.   On: 04/10/2013 11:29   Dg Chest Port 1 View  04/03/2013   CLINICAL DATA:  Central venous catheter placement  EXAM: PORTABLE CHEST - 1 VIEW  COMPARISON:  DG CHEST 1V PORT dated 04/01/2013  FINDINGS: Left upper extremity PICC is been placed. Tip is in the lower SVC. Tracheostomy tube and left atrial appendage clip stable. Left pleural effusion and basilar consolidation stable. Vascular congestion increased.  IMPRESSION: Left PICC placed with its tip at the lower SVC.  Increased vascular congestion.  Stable left basilar consolidation and left pleural effusion.   Electronically Signed   By: Maryclare Bean M.D.   On: 04/03/2013 09:04   Dg Chest Port 1 View  04/01/2013   CLINICAL DATA:  Leukocytosis  EXAM: PORTABLE CHEST - 1 VIEW  COMPARISON:  03/28/2013  FINDINGS: Postsurgical changes are again seen. A right-sided jugular central line is noted in the right innominate vein stable from the prior exam. The cardiac shadow is stable. Left basilar consolidation  with increasing effusion is noted.  IMPRESSION: Increasing left basilar infiltrate and effusion.   Electronically Signed   By: Alcide Clever M.D.   On: 04/01/2013 09:10   Dg Abd 2 Views  03/30/2013   CLINICAL DATA:  Colonic distention.  Multiple abdominal surgeries.  EXAM: ABDOMEN - 2 VIEW  COMPARISON:  DG ABD 2 VIEWS dated 03/29/2013; CT ABD/PELVIS W CM dated 03/28/2013  FINDINGS: Trace blunting of the right costophrenic angle. No free intraperitoneal gas.  Dilated loops of left upper quadrant small bowel measure up to 4 cm, similar to the prior exam. There are several air-fluid levels within  these small bowel loops. Reduced colonic gas compared to the prior exam. IVC filter noted.  IMPRESSION: 1. Mildly dilated loops of left upper quadrant small bowel with scattered internal air-fluid levels, similar to the prior exam, with abnormal but nonspecific pattern which could be due to could ileus or partial small bowel obstruction. Correlate with bowel sounds and bowel function.   Electronically Signed   By: Herbie Baltimore M.D.   On: 03/30/2013 10:43   Dg Abd 2 Views  03/29/2013   CLINICAL DATA:  Lower abdominal pain. Nausea. Shortness of breath. Weakness.  EXAM: ABDOMEN - 2 VIEW  COMPARISON:  Abdominal radiograph 03/28/2013.  FINDINGS: Some colonic gas is noted. However, there are multiple borderline dilated of mildly dilated loops of gas-filled small bowel measuring up to 4.2 cm in diameter throughout the central abdomen and left side of the abdomen. No pneumoperitoneum appreciated on the left lateral decubitus view. Several small air-fluid levels are noted. Residual iodinated contrast material is noted within the lumen of the urinary bladder related to yesterday's CT examination. Numerous surgical clips are noted in the right upper quadrant of the abdomen. IVC filter projecting over either the right side at L2-L3.  IMPRESSION: 1. Nonspecific bowel gas pattern, as above, suggestive of partial small bowel obstruction. 2. No pneumoperitoneum.   Electronically Signed   By: Trudie Reed M.D.   On: 03/29/2013 13:38   Dg Abd Acute W/chest  03/28/2013   CLINICAL DATA:  Evaluate for a bowel obstruction.  EXAM: ACUTE ABDOMEN SERIES (ABDOMEN 2 VIEW & CHEST 1 VIEW)  COMPARISON:  None.  FINDINGS: Tracheostomy tube tip is above the carina. Heart size is mildly enlarged. The lung volumes are low. There is asymmetric elevation of the left hemidiaphragm. Left pleural effusion is noted.  Patient has an IVC filter. There are persistent abnormally dilated loops of small bowel which measure up to 5.5 cm. Gas is  noted within the colon and rectum. There is no evidence of dilated bowel loops or free intraperitoneal air. No radiopaque calculi or other significant radiographic abnormality is seen. Heart size and mediastinal contours are within normal limits. Both lungs are clear.  IMPRESSION: 1. Persistent small bowel dilatation compatible with either partial small bowel obstruction or ileus. 2. No change in aeration to the lungs compared with previous exam   Electronically Signed   By: Signa Kell M.D.   On: 03/28/2013 18:56   Dg Abd Portable 1v  04/19/2013   CLINICAL DATA:  Evaluate barium in bowel  EXAM: PORTABLE ABDOMEN - 1 VIEW  COMPARISON:  04/07/2013  FINDINGS: Oral contrast is seen in the gastric fundus. The remainder of the stomach does not show significant contrast. There is no significant barium in the larg or small bowel. Negative for bowel obstruction.  IVC filter unchanged at the L2-3 level.  IMPRESSION: Barium is localized to the gastric  fundus. Negative for bowel obstruction.   Electronically Signed   By: Marlan Palau M.D.   On: 04/19/2013 08:05   Dg Abd Portable 1v  04/07/2013   CLINICAL DATA:  Abdominal pain.  EXAM: PORTABLE ABDOMEN - 1 VIEW  COMPARISON:  None.  FINDINGS: The bowel gas pattern is normal. IVC filter seen at the level of L3. Surgical clips from prior cholecystectomy noted. No radio-opaque calculi or other significant radiographic abnormality are seen.  IMPRESSION: No acute findings.  Unremarkable bowel gas pattern.   Electronically Signed   By: Myles Rosenthal M.D.   On: 04/07/2013 15:26   Dg Abd Portable 1v  04/04/2013   CLINICAL DATA:  Evaluate barium  EXAM: PORTABLE ABDOMEN - 1 VIEW  COMPARISON:  03/30/2013  FINDINGS: Contrast material is noted within the transverse colon and splenic flexure of the colon. Small bowel loop distention has improved. Slight prominence of left abdominal small bowel loops persists. No free air. No organomegaly. Prior cholecystectomy.  IMPRESSION: Improving  small bowel distention. Small amount of oral contrast material within the transverse colon and splenic flexure of the colon.   Electronically Signed   By: Charlett Nose M.D.   On: 04/04/2013 10:18   Dg Swallowing Func-speech Pathology  04/11/2013   Riley Nearing Deblois, CCC-SLP     04/11/2013  3:10 PM Objective Swallowing Evaluation: Modified Barium Swallowing Study   Patient Details  Name: Tim Short MRN: [id number] Date of Birth: 08-09-1934  Today's Date: 04/11/2013 Time: 4098-1191 SLP Time Calculation (min): 25 min  Past Medical History:  Past Medical History  Diagnosis Date  . Hypertension   . Hypercholesteremia   . Asthma   . Meniere disease   . Persistent atrial fibrillation     a. s/p multiple dccv's;  b. failed amio;  c. not felt to be AF  RFCA canddiate due to LA dil;  d. s/p failed hybrid ablation @  [facility] in 09/2012;  e. chronic pradaxa.  . Obesity   . Biatrial enlargement     LA size 5.3cm  . Obstructive sleep apnea     AHI 108/hr now on CPAP at 12cm H2O  . H/O hiatal hernia   . GERD (gastroesophageal reflux disease)     "associated w/hiatal hernia" (2012/06/14)  . Peptic ulcer 1950's  . Migraines     "haven't had one for about 15 years" (06-14-2012)  . Arthritis     "joints" (06-14-2012)  . Chronic lower back pain   . Nephrolithiasis ~ 1960's    "passed on their own" (2012-06-14)  . History of pneumonia 2002; 2006    "spent 8 days in isolation; Norovirus" (06/14/12)  . Other and unspecified angina pectoris   . RLS (restless legs syndrome)   . Coronary artery disease     a. 05/2012 Cath/PCI: LM nl, LAD nl, LCX 20m (3.0x15 Integrity  BMS), PTCA of OM1 through stent struts (kissing balloon).  . Diabetes mellitus without complication     FAMILY STATES PATIENT IS NOT DIABETIC   Past Surgical History:  Past Surgical History  Procedure Laterality Date  . Wrist fracture surgery  1992    "repaired w/left hip bone graft" (06-14-12)  . Total knee arthroplasty  08/19/1999  . Colonoscopy w/ polypectomy  2006  . Knee  arthroscopy with meniscal repair Right 1974    "medial meniscus repaired" (06-14-12)  . Cardioversion  10/02/2011    Procedure: CARDIOVERSION;  Surgeon: Corky Crafts, MD;   Location: [facility] ENDOSCOPY;  Service:  Cardiovascular;  Laterality:  N/A;  h/p in file drawer  . Cardioversion  11/07/2011    Procedure: CARDIOVERSION;  Surgeon: Corky Crafts, MD;   Location: [facility] ENDOSCOPY;  Service: Cardiovascular;  Laterality:  N/A;  h/p from 10/22 in file drawer/dl  . Cardiac catheterization  05/26/2012  . Coronary angioplasty with stent placement  06/04/2012    "1" (06/04/2012)  . Cataract extraction w/ intraocular lens  implant, bilateral   2009  . Hemorrhoid surgery  ~ 2003  . Hiatal hernia repair  1983  . Nissen fundoplication  1983  . Ablation of dysrhythmic focus  SEPT/OCT 2014    ATRIAL FIB  . Inguinal hernia repair Bilateral 2000 and 2005    "one done at a time" (06/04/2012)  . Colon surgery  10/08/2012   . Tracheostomy  OCT 2014   HPI:  Pt is 79 yo male with fairly recent hospitalization at [facility] in  September 2014 for failed maize procedure and during that  hospitalization pt developed volvulus and has required right  colectomy (10/08/2012), subsequently 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 Pt was in  prolonged respiratory failure at [facility], unable to wean of the vent  and requiring trach placement 10/29/2012. He was transferred to  [facility] in December 2014 and later to [facility] in late January  2015. He was on TNA and was doing fairly well initially, but  developed more abdominal pain and on 2/23 taken to OR in [place]  for gallbladder removal (secondary to cholecystitis), resection  for part of the colon for fistula, IVC filter placement. Sent  back to [facility] on march 4th, 2015 and started on TPN. Diet  advanced on March 9th, 2015 after pt passed swallow evaluation.  He was brought to [facility] March 16th, after noticing progressive  abdominal distension. In ED, CT abdomen with  air- fluid level and  findings consistent with early partial SBO, ileus. Surgery  reports pt is ok for PO from their standpoint, SLP ordered for  swallow eval.      Assessment / Plan / Recommendation Clinical Impression  Dysphagia Diagnosis: Moderate pharyngeal phase dysphagia Clinical impression: Pt demonstrates significant improvement in  motor function since last MBS. Pts primary decifits are now  exclusively sensory in nature. Pt has a delay in swallow  initiation with nectar and thin teaspoon amounts with aspiration  before the swallow. With a chin tuck, the pt is albe to protect  the airway before the swallow with nectar vea teaspoon or straw.  Pt is also able to masticate soft and regular textured solids. He  was noted to have expectoration of mucous/barium after the test,  possible esophageal component not visualized due to body habitus.  Pt is recommended to upgrade to a dys 2 Finely chopped diet with  nectar thick liquids via spoon or straws with full supervison for  a chin tuck and placement of PMSV. Pt may upgrade solids if he  tolerates dys 2 diet.     Treatment Recommendation  Therapy as outlined in treatment plan below    Diet Recommendation Dysphagia 2 (Fine chop);Nectar-thick liquid   Liquid Administration via: Spoon;Straw Medication Administration: Crushed with puree Supervision: Patient able to self feed;Full supervision/cueing  for compensatory strategies Compensations: Slow rate;Small sips/bites Postural Changes and/or Swallow Maneuvers: Chin tuck;Seated  upright 90 degrees    Other  Recommendations Oral Care Recommendations: Oral care BID Other Recommendations: Place PMSV during PO intake;Order  thickener from pharmacy;Have oral suction available  Follow Up Recommendations  Skilled Nursing facility    Frequency and Duration min 2x/week  2 weeks   Pertinent Vitals/Pain NA    SLP Swallow Goals     General HPI: Pt is 79 yo male with fairly recent hospitalization  at [facility] in September 2014 for  failed maize procedure and during  that hospitalization pt developed volvulus and has required right  colectomy (10/08/2012), subsequently 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 Pt was in  prolonged respiratory failure at [facility], unable to wean of the vent  and requiring trach placement 10/29/2012. He was transferred to  [facility] in December 2014 and later to [facility] in late January  2015. He was on TNA and was doing fairly well initially, but  developed more abdominal pain and on 2/23 taken to OR in [place]  for gallbladder removal (secondary to cholecystitis), resection  for part of the colon for fistula, IVC filter placement. Sent  back to [facility] on march 4th, 2015 and started on TPN. Diet  advanced on March 9th, 2015 after pt passed swallow evaluation.  He was brought to [facility] March 16th, after noticing progressive  abdominal distension. In ED, CT abdomen with air- fluid level and  findings consistent with early partial SBO, ileus. Surgery  reports pt is ok for PO from their standpoint, SLP ordered for  swallow eval.  Type of Study: Modified Barium Swallowing Study Reason for Referral: Objectively evaluate swallowing function Previous Swallow Assessment: [facility] hospital - 3/16 pt "passed"  FEES and was started on diet, began to decline and blue dye test  was given, pt silently aspirated thin, nectar, honey per report.  Diet Prior to this Study: Dysphagia 1 (puree);Pudding-thick  liquids Temperature Spikes Noted: No Respiratory Status: Trach collar Trach Size and Type: Cuff;Deflated;With PMSV in place;#6;Other  (Comment) History of Recent Intubation: No Behavior/Cognition: Alert;Cooperative;Requires cueing;Hard of  hearing Oral Cavity - Dentition: Missing dentition Oral Motor / Sensory Function: Impaired - see Bedside swallow  eval Self-Feeding Abilities: Able to feed self;Needs assist Patient Positioning: Upright in chair Baseline Vocal Quality: Low vocal intensity Volitional  Cough: Strong Volitional Swallow: Able to elicit Anatomy: Within functional limits Pharyngeal Secretions: Not observed secondary MBS    Reason for Referral Objectively evaluate swallowing function   Oral Phase Oral Preparation/Oral Phase Oral Phase: Impaired Oral Phase - Comment Oral Phase - Comment: missing dentition, but places boluse  effectively between remaining teeth. otherwise WFL.    Pharyngeal Phase Pharyngeal Phase Pharyngeal Phase: Impaired Pharyngeal - Honey Pharyngeal - Honey Teaspoon: Not tested Pharyngeal - Honey Cup: Not tested Pharyngeal - Nectar Pharyngeal - Nectar Teaspoon: Delayed swallow  initiation;Penetration/Aspiration before swallow (chin tuck  increases airway closure, preents penetration) Penetration/Aspiration details (nectar teaspoon): Material enters  airway, passes BELOW cords without attempt by patient to eject  out (silent aspiration);Material does not enter airway Pharyngeal - Nectar Straw: Delayed swallow initiation (chin tuck) Pharyngeal - Thin Pharyngeal - Thin Teaspoon: Delayed swallow  initiation;Penetration/Aspiration before swallow (chin tuck) Penetration/Aspiration details (thin teaspoon): Material enters  airway, CONTACTS cords and not ejected out Pharyngeal - Solids Pharyngeal - Puree: Delayed swallow initiation (chin tuck) Pharyngeal - Mechanical Soft: Delayed swallow initiation Pharyngeal - Regular: Delayed swallow initiation  Cervical Esophageal Phase    GO             Harlon Ditty, MA CCC-SLP [phone]  Claudine Mouton 04/11/2013, 3:08 PM    Dg Swallowing Func-speech Pathology  04/02/2013   Breck Coons Litaker, CCC-SLP  04/02/2013  2:03 PM Objective Swallowing Evaluation: Modified Barium Swallowing Study   Patient Details  Name: AMBROSIO REUTER MRN: [id number] Date of Birth: 03-22-1934  Today's Date: 04/02/2013 Time: 1225-1250 SLP Time Calculation (min): 25 min  Past Medical History:  Past Medical History  Diagnosis Date  . Hypertension   . Hypercholesteremia    . Asthma   . Meniere disease   . Persistent atrial fibrillation     a. s/p multiple dccv's;  b. failed amio;  c. not felt to be AF  RFCA canddiate due to LA dil;  d. s/p failed hybrid ablation @  [facility] in 09/2012;  e. chronic pradaxa.  . Obesity   . Biatrial enlargement     LA size 5.3cm  . Obstructive sleep apnea     AHI 108/hr now on CPAP at 12cm H2O  . H/O hiatal hernia   . GERD (gastroesophageal reflux disease)     "associated w/hiatal hernia" (06/30/2012)  . Peptic ulcer 1950's  . Migraines     "haven't had one for about 15 years" (2012-06-30)  . Arthritis     "joints" (2012/06/30)  . Chronic lower back pain   . Nephrolithiasis ~ 1960's    "passed on their own" (Jun 30, 2012)  . History of pneumonia 2002; 2006    "spent 8 days in isolation; Norovirus" (06/30/2012)  . Other and unspecified angina pectoris   . RLS (restless legs syndrome)   . Coronary artery disease     a. 05/2012 Cath/PCI: LM nl, LAD nl, LCX 58m (3.0x15 Integrity  BMS), PTCA of OM1 through stent struts (kissing balloon).  . Diabetes mellitus without complication     FAMILY STATES PATIENT IS NOT DIABETIC   Past Surgical History:  Past Surgical History  Procedure Laterality Date  . Wrist fracture surgery  1992    "repaired w/left hip bone graft" (2012-06-30)  . Total knee arthroplasty  08/19/1999  . Colonoscopy w/ polypectomy  2006  . Knee arthroscopy with meniscal repair Right 1974    "medial meniscus repaired" (2012-06-30)  . Cardioversion  10/02/2011    Procedure: CARDIOVERSION;  Surgeon: Corky Crafts, MD;   Location[facility] ENDOSCOPY;  Service: Cardiovascular;  Laterality:  N/A;  h/p in file drawer  . Cardioversion  11/07/2011    Procedure: CARDIOVERSION;  Surgeon: Corky Crafts, MD;   Location: [facility] ENDOSCOPY;  Service: Cardiovascular;  Laterality:  N/A;  h/p from 10/22 in file drawer/dl  . Cardiac catheterization  05/26/2012  . Coronary angioplasty with stent placement  June 30, 2012    "1" (06-30-2012)  . Cataract extraction w/ intraocular lens   implant, bilateral   2009  . Hemorrhoid surgery  ~ 2003  . Hiatal hernia repair  1983  . Nissen fundoplication  1983  . Ablation of dysrhythmic focus  SEPT/OCT 2014    ATRIAL FIB  . Inguinal hernia repair Bilateral 2000 and 2005    "one done at a time" (06/30/2012)  . Colon surgery  10/08/2012   . Tracheostomy  OCT 2014   HPI:  Pt is 79 yo male with fairly recent hospitalization at [facility] in  September 2014 for failed maize procedure and during that  hospitalization pt developed volvulus and has required right  colectomy (10/08/2012), subsequently 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 Pt was in  prolonged respiratory failure at [facility], unable to wean of the vent  and requiring trach placement 10/29/2012. He was transferred to  [facility] in December 2014 and later to  [facility] in late January  2015. He was on TNA and was doing fairly well initially, but  developed more abdominal pain and on 2/23 taken to OR in [place]  for gallbladder removal (secondary to cholecystitis), resection  for part of the colon for fistula, IVC filter placement. Sent  back to [facility] on march 4th, 2015 and started on TPN. Diet  advanced on March 9th, 2015 after pt passed swallow evaluation.  [facility] was [facility], after noticing progressive  abdominal distension. In ED, CT abdomen with air- fluid level and  findings consistent with early partial SBO, ileus. Surgery  reports pt is ok for PO from their standpoint, SLP ordered for  swallow eval.      Assessment / Plan / Recommendation Clinical Impression  Dysphagia Diagnosis: Moderate pharyngeal phase dysphagia;Severe  pharyngeal phase dysphagia Clinical impression: MBS completed with pt's PMSV donned and  appears to be congruent with study performed at [facility].  He  demonstrated moderate-severe motor based pharyngeal dyspahgia as  evidenced by reduced tongue base retraction, decreased laryngeal  elevation and decreased pharyngeal contraction.   Impairments led  to mild pharyngeal residue in vallecule/pyriform sinuses and  posterior pharyngeal wall and laryngeal vestibule consistently  silent penetration to vocal cords.  Verbal cues for hard cough  briefly cleared vestibule with subsequent penetration from  residue.  SLP recommends Dys 1 (puree) only, no liquids (pudding  thick liquid) with speaking valve donned, two swallows,  volitional coughs, pills crushed in applesauce and full  supervision/assist.       Treatment Recommendation  Therapy as outlined in treatment plan below    Diet Recommendation Dysphagia 1 (Puree);Pudding-thick liquid   Medication Administration: Crushed with puree Supervision: Patient able to self feed;Full supervision/cueing  for compensatory strategies Compensations: Slow rate;Small sips/bites;Multiple dry swallows  after each bite/sip;Hard cough after swallow Postural Changes and/or Swallow Maneuvers: Seated upright 90  degrees;Upright 30-60 min after meal, wear speaking valve during  all po's    Other  Recommendations Oral Care Recommendations: Oral care BID Other Recommendations: Order thickener from pharmacy   Follow Up Recommendations  Skilled Nursing facility    Frequency and Duration min 2x/week  2 weeks   Pertinent Vitals/Pain WDL            Reason for Referral Objectively evaluate swallowing function   Oral Phase Oral Preparation/Oral Phase Oral Phase: WFL (WFL for textures assessed)   Pharyngeal Phase Pharyngeal Phase Pharyngeal Phase: Impaired Pharyngeal - Honey Pharyngeal - Honey Teaspoon: Reduced pharyngeal  peristalsis;Pharyngeal residue - valleculae;Reduced tongue base  retraction;Penetration/Aspiration during swallow;Pharyngeal  residue - pyriform sinuses;Reduced laryngeal elevation Penetration/Aspiration details (honey teaspoon): Material enters  airway, CONTACTS cords and not ejected out Pharyngeal - Honey Cup: Penetration/Aspiration during  swallow;Pharyngeal residue - valleculae;Pharyngeal residue -  pyriform  sinuses;Reduced tongue base retraction;Reduced laryngeal  elevation Penetration/Aspiration details (honey cup): Material enters  airway, remains ABOVE vocal cords then ejected out Pharyngeal - Solids Pharyngeal - Puree: Pharyngeal residue - valleculae;Reduced  tongue base retraction;Pharyngeal residue - pyriform  sinuses;Reduced laryngeal elevation  Cervical Esophageal Phase        Cervical Esophageal Phase Cervical Esophageal Phase: [facility]         Darrow BussingLisa Willis Litaker M.Ed CCC-SLP Pager [phone]  04/02/2013         Subjective: Patient complains of his chronic abdominal pain which is not worse than usual. Last bowel movement today. No vomiting. He is slightly more short of breath today. He denies any headache, chest pain,  fevers, chills.  Objective: Filed Vitals:   04/23/13 0500 04/23/13 0543 04/23/13 0600 04/23/13 0700  BP: 104/58 92/43 94/47  97/48  Pulse: 64  58 62  Temp:      TempSrc:      Resp: 20  18 17   Height:      Weight: 91 kg (200 lb 9.9 oz)     SpO2: 96%  100% 98%    Intake/Output Summary (Last 24 hours) at 04/23/13 0748 Last data filed at 04/23/13 0700  Gross per 24 hour  Intake   3067 ml  Output   1077 ml  Net   1990 ml   Weight change: -0.7 kg (-1 lb 8.7 oz) Exam:   General:  Arousable, following commands  HEENT: No icterus, No thrush, [place]/AT  Cardiovascular: RRR, S1/S2, no rubs, no gallops  Respiratory: improved lung exam with decreased crackles compared yesterday's exam  Abdomen: Soft/+BS, non tender, non distended, no guarding  Extremities: trace LE edema, No lymphangitis, No petechiae, No rashes, no synovitis  Data Reviewed: Basic Metabolic Panel:  Recent Labs Lab 04/18/13 0622  04/20/13 0353 04/21/13 0300 04/22/13 0311 04/22/13 2048 04/23/13 0430  NA 142  < > 140 140 140 142 136*  K 4.1  < > 5.1 3.5* 2.5* 3.0* 4.0  CL 104  < > 102 101 96 100 97  CO2 26  < > 28 26 28 27 25   GLUCOSE 166*  < > 212* 119* 106* 131* 717*  BUN 19  < > 24* 34* 30*  31* 28*  CREATININE 0.74  < > 0.75 0.95 0.83 0.76 0.80  CALCIUM 8.5  < > 8.9 8.7 8.3* 8.2* 8.1*  MG 2.1  --   --  1.9 1.8  --  2.0  PHOS 3.8  --   --  2.4 2.7  --  4.8*  < > = values in this interval not displayed. Liver Function Tests:  Recent Labs Lab 04/18/13 0622 04/21/13 0300  AST 33 19  ALT 49 26  ALKPHOS 74 66  BILITOT <0.2* 0.3  PROT 5.1* 5.1*  ALBUMIN 2.2* 2.3*   No results found for this basename: LIPASE, AMYLASE,  in the last 168 hours No results found for this basename: AMMONIA,  in the last 168 hours CBC:  Recent Labs Lab 04/18/13 0622 04/19/13 0620 04/20/13 0353 04/21/13 0300 04/22/13 0311 04/23/13 0430  WBC 8.6 8.7 11.2* 10.8* 8.8 6.5  NEUTROABS 6.4  --   --   --   --   --   HGB 8.1* 7.7* 8.2* 7.4* 7.5* 7.2*  HCT 26.4* 24.6* 26.4* 23.4* 23.9* 22.0*  MCV 96.0 95.0 96.7 94.0 92.3 94.0  PLT 121* 117* 149* 151 150 134*   Cardiac Enzymes: No results found for this basename: CKTOTAL, CKMB, CKMBINDEX, TROPONINI,  in the last 168 hours BNP: No components found with this basename: POCBNP,  CBG:  Recent Labs Lab 04/22/13 0726 04/22/13 1627 04/22/13 2021 04/23/13 0111 04/23/13 0417  GLUCAP 174* 200* 141* 147* 165*    Recent Results (from the past 240 hour(s))  URINE CULTURE     Status: None   Collection Time    04/13/13  2:31 PM      Result Value Ref Range Status   Specimen Description URINE, RANDOM   Final   Special Requests NONE   Final   Culture  Setup Time     Final   Value: 04/13/2013 18:52     Performed at [facility]  Colony Count     Final   Value: 8,000 COLONIES/ML     Performed at [facility]   Culture     Final   Value: INSIGNIFICANT GROWTH     Performed at [facility]   Report Status 04/14/2013 FINAL   Final  CLOSTRIDIUM DIFFICILE BY PCR     Status: Abnormal   Collection Time    04/16/13  4:44 PM      Result Value Ref Range Status   C difficile by pcr POSITIVE (*) NEGATIVE Final   Comment: CRITICAL  RESULT CALLED TO, READ BACK BY AND VERIFIED WITH:     M.CROSSON,RN 1806 04/16/13 M.CAMPBELL     Scheduled Meds: . amiodarone  200 mg Oral BID  . antiseptic oral rinse  15 mL Mouth Rinse QID  . atorvastatin  40 mg Oral Daily  . chlorhexidine  15 mL Mouth Rinse BID  . diltiazem  120 mg Oral TID WC & HS  . hydrocortisone cream   Topical BID  . insulin aspart  0-15 Units Subcutaneous 6 times per day  . metolazone  5 mg Oral Daily  . metoprolol  2.5 mg Intravenous 4 times per day  . metoprolol tartrate  12.5 mg Oral BID  . metronidazole  500 mg Intravenous Q8H  . sodium chloride  10-40 mL Intracatheter Q12H   Continuous Infusions: . sodium chloride 10 mL/hr at 04/20/13 1200  . Marland KitchenTPN (CLINIMIX-E) Adult 83 mL/hr at 04/22/13 1846   And  . fat emulsion 240 mL (04/22/13 1846)     Jeralyn Bennett, DO  [facility] Pager [phone]  If 7PM-7AM, please contact night-coverage [email or web address] Password [email or web address] 04/23/2013, 7:48 AM   LOS: 26 days

## 2013-04-23 NOTE — Progress Notes (Signed)
PULMONARY / CRITICAL CARE MEDICINE   Name: Tim Short MRN: [id number] DOB: 1934/09/19    ADMISSION DATE:  03/28/2013 CONSULTATION DATE:  04/20/2013  REFERRING MD :  Dr. Vanessa Barbara PRIMARY SERVICE:  PCCM  CHIEF COMPLAINT:  AMS / Hypoxia   BRIEF PATIENT DESCRIPTION: 79 yo with complicated, prolonged illness since 09/2012 with respiratory failure, tracheostomy, dysphagia, cholecystitis s/p cholecystectomy, colon fistula, IVC filter placement, SBO, AF with RVR.  4/8 patient decompensated on medical floor with AMS and hypoxia.  PCCM consulted for evaluation.    SIGNIFICANT EVENTS / STUDIES:   09/2012 - Admit at [facility] for failed MAIZE procedure, developed volvulus requiring R colectomy.  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 03/07/13 - Developed abd pain, distention at [facility].  Taken to OR in [facility] for gallbladder removal, resection of colon fistula, IVC filter placement 03/2013 - Returned back to [facility] 03/16/13, started on TPN.  Passed swallow eval 3/9 03/28/13 - Progressive abd pain / distension.  Tx to [facility] with CT abdomen with early SBO/Ileus.  Admitted by [facility]. 04/08/13 - SBO resolved.  Surgery s/o.  Developed Afib with RVR.  Cardioverted per Cardiology.  SR 3/28-3/29 with plan for d/c on 3/31 but went back into fib on 3/30 -on heparin gtt.  TPN continued.   04/20/13 - decompensated & Tx to ICU  LINES / TUBES: Trach (chronic)>>> LUE PICC 3/22>>>  CULTURES: Sputum 3/22>>> KLEBSIELLA BCx2 3/26>>>neg C-Diff 3/29>>>neg UC 4/2>>>neg C-Diff 4/4 >>> POSITIVE  ANTIBIOTICS: Flagyl 4/4>>>  SUBJECTIVE: Weaning on PSV 10/5   Awake and alert, no distress  VITAL SIGNS: Temp:  [98.4 F (36.9 C)-98.6 F (37 C)] 98.6 F (37 C) (04/11 4098) Pulse Rate:  [32-136] 61 (04/11 0812) Resp:  [14-30] 17 (04/11 0812) BP:  (92-126)/(43-71) 102/48 mmHg (04/11 0812) SpO2:  [88 %-100 %] 100 % (04/11 0812) FiO2 (%):  [40 %-100 %] 40 % (04/11 0812) Weight:  [91 kg (200 lb 9.9 oz)] 91 kg (200 lb 9.9 oz) (04/11 0500)  HEMODYNAMICS: CV stable, no pressors    VENTILATOR SETTINGS: Vent Mode:  [-] PSV;CPAP FiO2 (%):  [40 %-100 %] 40 % Set Rate:  [16 bmp] 16 bmp Vt Set:  [600 mL] 600 mL PEEP:  [5 cmH20] 5 cmH20 Pressure Support:  [10 cmH20] 10 cmH20 Plateau Pressure:  [19 cmH20-21 cmH20] 21 cmH20  INTAKE / OUTPUT: Intake/Output     04/10 0701 - 04/11 0700 04/11 0701 - 04/12 0700   I.V. (mL/kg) 235 (2.6) 10 (0.1)   IV Piggyback 600    TPN 2232 93   Total Intake(mL/kg) 3067 (33.7) 103 (1.1)   Urine (mL/kg/hr) 1075 (0.5) 200 (1)   Stool 2 (0)    Total Output 1077 200   Net +1990 -97        Urine Occurrence 1 x      PHYSICAL EXAMINATION: General:  Chronically ill appearing adult male in NAD on vent Neuro:  follows commands HEENT:  Mm pale, moist.  Trach midline (?portex) c/d/i Cardiovascular:  s1s2 irr irr, no m/r/g Lungs: coarse breath sounds b/l throughout Abdomen:  Round/soft, bsx4 hypoactive Musculoskeletal:  No acute deformities Skin:  Warm/dry, trace edema in BLE  LABS:  CBC  Recent Labs Lab 04/21/13 0300 04/22/13 0311 04/23/13 0430  WBC 10.8* 8.8 6.5  HGB 7.4* 7.5* 7.2*  HCT 23.4* 23.9* 22.0*  PLT 151 150 134*   Coag's  Recent Labs Lab 04/16/13 1230 04/17/13 1100 04/18/13 0622 04/19/13 0620  APTT 30 72* 92* 61*  INR 1.13  --   --  1.07   BMET  Recent Labs Lab 04/22/13 0311 04/22/13 2048 04/23/13 0430  NA 140 142 136*  K 2.5* 3.0* 4.0  CL 96 100 97  CO2 28 27 25   BUN 30* 31* 28*  CREATININE 0.83 0.76 0.80  GLUCOSE 106* 131* 717*   Electrolytes  Recent Labs Lab 04/21/13 0300 04/22/13 0311 04/22/13 2048 04/23/13 0430  CALCIUM 8.7 8.3* 8.2* 8.1*  MG 1.9 1.8  --  2.0  PHOS 2.4 2.7  --  4.8*   Sepsis Markers No results found for this basename:  LATICACIDVEN, PROCALCITON, O2SATVEN,  in the last 168 hours ABG  Recent Labs Lab 04/20/13 0830 04/20/13 1027  PHART 7.122* 7.419  PCO2ART 92.1* 38.9  PO2ART 61.5* 60.0*   Liver Enzymes  Recent Labs Lab 04/18/13 0622 04/21/13 0300  AST 33 19  ALT 49 26  ALKPHOS 74 66  BILITOT <0.2* 0.3  ALBUMIN 2.2* 2.3*   Cardiac Enzymes No results found for this basename: TROPONINI, PROBNP,  in the last 168 hours Glucose  Recent Labs Lab 04/22/13 0726 04/22/13 1627 04/22/13 2021 04/23/13 0111 04/23/13 0417 04/23/13 0834  GLUCAP 174* 200* 141* 147* 165* 163*   IMAGING:   Ir Gastrostomy Tube Mod Sed  04/22/2013   INDICATION: History of colectomy following a volvulus, complicated by respiratory failure and subsequent tracheostomy tube placement. Request has been made for percutaneous gastrostomy tube placement secondary to dysphagia and poor enteric intake. Of note, patient has history of prior Nissen fundoplication surgery.  EXAM: PUSH GASTROSTOMY TUBE PLACEMENT  COMPARISON:  DG ABD PORTABLE 1V dated 04/22/2013; CT ABD/PELVIS W CM dated 04/04/2013  MEDICATIONS: Ancef 2 g IV.  Antibiotics were administered within 1 hour of the procedure.  CONTRAST:  40 mL of Isovue 300 administered into the gastric lumen.  ANESTHESIA/SEDATION: Conscious sedation was achieved with intravenous Versed and fentanyl.  Sedation time  20 minutes  FLUOROSCOPY TIME:  8 minutes 54 seconds.  COMPLICATIONS: None immediate  PROCEDURE: Informed written consent was obtained from the patient's family following explanation of the procedure, risks, benefits and alternatives. A time out was performed prior to the initiation of the procedure. Ultrasound scanning was performed to demarcate the edge of the left lobe of the liver. Thin barium was administered in a retrograde fashion to opacify the transverse colon. Maximal barrier sterile technique utilized including caps, mask, sterile gowns, sterile gloves, large sterile drape, hand  hygiene and Betadine prep.  The left upper quadrant was sterilely prepped and draped. A oral gastric catheter was inserted into the stomach under fluoroscopy. The existing nasogastric feeding tube was removed. The left costal margin and barium opacified transverse colon were identified and avoided. Air was injected into the stomach for insufflation and visualization under fluoroscopy. Under sterile conditions and local anesthesia, 3 T tacks were utilized to pexy the anterior aspect of the stomach against the ventral abdominal wall. Contrast injection confirmed appropriate positioning of each of the T tacks. An incision was made between the T tacks and a 17 gauge trocar needle was utilized to access the stomach. Needle position was confirmed within the stomach with aspiration of air and injection of a small amount of contrast. A stiff Glidewire was advanced into the gastric lumen and under intermittent fluoroscopic guidance, the access needle was exchanged for a  Kumpe catheter. With the use of the Kumpe catheter, a stiff Glidewire was advanced into the horizontal segment of the duodenum. Under intermittent fluoroscopic guidance, the Kumpe catheter was exchanged for a telescoping peel-away sheath, ultimately allowing placement of a 18-French balloon retention gastrostomy tube. The retention balloon was insufflated with a mixture of dilute saline and contrast and pulled taut against the anterior wall of the stomach. The external disc was cinched. Contrast injection confirms positioning within the stomach. Several spot radiographic images were obtained in various obliquities for documentation. T he patient tolerated procedure well without immediate post procedural complication.  FINDINGS: After successful fluoroscopic guided placement, the gastrostomy tube is appropriately positioned with internal retention balloon against the ventral aspect of the gastric lumen.  IMPRESSION: Successful fluoroscopic insertion of an 40  French balloon retention gastrostomy tube.  The gastrostomy may be used immediately for medication administration and in 24 hrs for the initiation of feeds.   Electronically Signed   By: Simonne Come M.D.   On: 04/22/2013 14:22   Dg Chest Port 1 View  04/22/2013   CLINICAL DATA:  Respiratory failure.  EXAM: PORTABLE CHEST - 1 VIEW  COMPARISON:  DG CHEST 1V PORT dated 04/20/2013  FINDINGS: Tracheostomy tube noted good anatomic position. Left PICC line in stable position. Stable cardiomegaly. Left atrial appendage clip again noted. Progressive bilateral pulmonary infiltrates are present. Small left effusion cannot be excluded. No pneumothorax. No acute osseous abnormality.  IMPRESSION: 1. Line and tube position stable. 2. Stable cardiomegaly.  Left atrial appendage clip again noted. 3. Progressive bilateral pulmonary alveolar infiltrates most likely secondary to pulmonary edema. Bilateral basilar pneumonia cannot be excluded. Small left pleural effusion cannot be excluded.   Electronically Signed   By: Maisie Fus  Register   On: 04/22/2013 07:56   Dg Abd Portable 1v  04/22/2013   CLINICAL DATA:  Check barium position  EXAM: PORTABLE ABDOMEN - 1 VIEW  COMPARISON:  04/21/2013  FINDINGS: Contrast material has progressed into the distal colon. Only minimal opacification of the distal transverse colon is seen. A Trapease filter is noted. No acute abnormality is seen.  IMPRESSION: Contrast material within the distal colon.   Electronically Signed   By: Alcide Clever M.D.   On: 04/22/2013 07:41   Dg Abd Portable 1v  04/21/2013   CLINICAL DATA:  re-check for barium  EXAM: PORTABLE ABDOMEN - 1 VIEW  COMPARISON:  DG ABD PORTABLE 1V dated 04/21/2013  FINDINGS: Small amount of residual barium within the stomach. Barium has progressed into loops of small bowel and colon. The rectum is not included on this study. Air-filled loops of large small bowel are appreciated.  IMPRESSION: Contrast is appreciated within loops of colon and  small bowel. Minimal residual contrast in the stomach. There are air-filled loops of bowel nonspecific nonobstructive pattern.   Electronically Signed   By: Salome Holmes M.D.   On: 04/21/2013 11:45   ASSESSMENT / PLAN:  PULMONARY A: Acute on chronic respiratory failure, Mucus plug? Chronic Tracheostomy, OSA  Stable L effusion COPD? No acute bronchospasm P:   - Goal pH>7.30, SpO2>92. - Continuous mechanical support, wean as able, desaturated on TC. - VAP bundle. - Xopenex PRN. - No indication for systemic steroids. - Continue  Diureses as tolerated  CARDIOVASCULAR A:  AF / RVR, HTN, HLD CAD, s/p cath with PCI P:  - Amiodarone, Cardizem, Lopressor, Lipitor. - Lopressor for HR >110. -off heparin on no dvt proph  RENAL A:   Fluid overlaod P:   -  Trend BMP. - Lasix. - Replete electrolytes   GASTROINTESTINAL A:   Dysphagia GERD C. Diff Pseudomembranous colitis P:   -PEG back in place -resume TF, d/c TNA  HEMATOLOGIC A:   Chronic anemia Therapeutic anticoagulation for AF P:  - Trend CBC. - resume  hep drip  INFECTIOUS A:   Pseudomembranous colitis P:   - Flagyl.  ENDOCRINE A:   DM II At risk for adrenal insufficiency ( chronic low dose Prednisone ? ) P:   - SSI, change to q4h. - Hydrocortisone if hypotensive.  NEUROLOGIC A:   Acute encephalopathy P:   - D/c Xanax. - Morphine PRN.   I have personally obtained history, examined patient, evaluated and interpreted laboratory and imaging results, reviewed medical records, formulated assessment / plan and placed orders.  CRITICAL CARE:  The patient is critically ill with multiple organ systems failure and requires high complexity decision making for assessment and support, frequent evaluation and titration of therapies, application of advanced monitoring technologies and extensive interpretation of multiple databases. Critical Care Time devoted to patient care services described in this note is 35  minutes.   Charlcie Cradle WrightMD Beeper  [phone]  Cell  [phone]  If no response or cell goes to voicemail, call beeper [phone]  04/23/2013 9:21 AM

## 2013-04-23 NOTE — Progress Notes (Signed)
PARENTERAL NUTRITION CONSULT NOTE - FOLLOW UP  Pharmacy Consult for TPN Indication: inadequate PO intake, resolving SBO  Allergies  Allergen Reactions  . Corticosteroids     Inhaled Corticosteroids--Hoarseness, dry mouth  . Furosemide Other (See Comments)    Ototoxicity    Patient Measurements: Height: 5' 8.9" (175 cm) Weight: 200 lb 9.9 oz (91 kg) IBW/kg (Calculated) : 70.47  Vital Signs: Temp: 98.6 F (37 C) (04/11 0812) Temp src: Oral (04/11 0812) BP: 102/48 mmHg (04/11 0812) Pulse Rate: 61 (04/11 0812) Intake/Output from previous day: 04/10 0701 - 04/11 0700 In: 3067 [I.V.:235; IV Piggyback:600; ZOX:0960]TPN:2232] Out: 1077 [Urine:1075; Stool:2] Intake/Output from this shift: Total I/O In: 103 [I.V.:10; TPN:93] Out: 200 [Urine:200]  Labs:  Recent Labs  04/21/13 0300 04/22/13 0311 04/23/13 0430  WBC 10.8* 8.8 6.5  HGB 7.4* 7.5* 7.2*  HCT 23.4* 23.9* 22.0*  PLT 151 150 134*     Recent Labs  04/21/13 0300 04/22/13 0311 04/22/13 2048 04/23/13 0430  NA 140 140 142 136*  K 3.5* 2.5* 3.0* 4.0  CL 101 96 100 97  CO2 26 28 27 25   GLUCOSE 119* 106* 131* 717*  BUN 34* 30* 31* 28*  CREATININE 0.95 0.83 0.76 0.80  CALCIUM 8.7 8.3* 8.2* 8.1*  MG 1.9 1.8  --  2.0  PHOS 2.4 2.7  --  4.8*  PROT 5.1*  --   --   --   ALBUMIN 2.3*  --   --   --   AST 19  --   --   --   ALT 26  --   --   --   ALKPHOS 66  --   --   --   BILITOT 0.3  --   --   --    Estimated Creatinine Clearance: 86.1 ml/min (by C-G formula based on Cr of 0.8).    Recent Labs  04/23/13 0111 04/23/13 0417 04/23/13 0834  GLUCAP 147* 165* 163*    Insulin Requirements in the past 24 hours:  5 units of Novolog + 20 units regular insulin in TPN bag  Current Nutrition:  Clinimix E 5/15 at 83 ml/hr + 20% lipid emulsion at 10 ml/hr (provides 1894kCal (>100% of goal) and 100g protein (71% of goal) per day)   Nutritional Goals:  1540-1750 kCal, >140 grams of protein per day per RD 4/9 (permissive  underfeeding)  Admit: Transferred from [facility] with abdominal pain, partial SBO, ileus.  GI: Hx GERD, PUD - resolved ileus per surgery.  Fall 2014 - volvulus, multiple abd surgeries, fistula development, was on TPN long-term but recently stopped and was tolerating PO intake per report. Now with poor PO intake. Currently NPO except sips with meds. Prealbumin trended up to 18.6 on 4/6. PEG tube placement was planned for 4/8 but was delayed by IR as patient still had barium in his stomach. Surgery was then going to do yesterday, but they do not feel he is a good candidate for a PEG d/t multiple gastrointestinal surgeries and complications along with his distorted anatomy and CDiff infection  Endo: Hx DM (A1c 6.4) - CBGs 141-165 in last 24 hours with SSI + insulin in TPN. Prednisone stopped 4/8  Lytes: Na 136, K 4 (got 6 runs per CCM) phos 4.8, Mag 2, Corr Ca 9.6. Lytes were added back to TPN with bag hung yesterday at 1800. Still appears that K has not fully recovered  Renal: SCr stable at 0.8, est CrCL ~1186mL/min, UOP 0.5   Pulm:  Hx asthma, OSA - 100% on 40% FiO2. Daliresp stopped 4/8, not currently on meds  Cards: Hx afib, HTN, HLD, CAD - BP 100/43, HR 32-136 (NSR s/p cardioversion 3/27 but now in afib again) - amio, lipitor, dilt, heparin (transitioned from Xarelto for PEG tube placement)  Hepatobil: LFTs wnl except low albumin at 2.3, Tbili WNL, trigs 114; HIT panel neg.  Neuro: Hx RLS, back pain, migraines - A&O  ID: Completed a 7d course of abx for HCAP 3/26 - Afebrile, WBC WNL - Flagyl D#7 for Cdiff  Best Practices: Heparin infusion, MC, Pepcid in TPN  TPN Access: PICC placed 3/22  TPN day#: 17  Plan:  1. Continue Clinimix E 5/15 at 83 ml/hr + 20% lipid emulsion at 10 ml/hr- provides 1894kCal (>100% of goal) and 100g protein (~71% of goal) per day. Due to restrictions of premade Clinimix bags and inability to use a repeater pump due to tubing restrictions from [employer], unable to meet  patient's re-estimated protein needs. 2. Daily MVI in TPN; trace elements M/W/F only due to national shortage   3. Continue moderate SSI and continue 20 units regular insulin in TPN bag 4. Continue Pepcid 20mg  in TPN bag.  5. BMET and phos in the morning 6. F/u CCM, nutrition, surgery plans for any ability to place feeding tube and possibly wean off TPN.  Vinnie Level, PharmD.  Clinical Pharmacist Pager [phone]

## 2013-04-23 NOTE — Progress Notes (Signed)
eLink Physician-Brief Progress Note Patient Name: Tim Short DOB: 04/16/1934 MRN: [id number]  Date of Service  04/23/2013   HPI/Events of Note   TPN stopped today as PEG placed. No TF orders.  eICU Interventions   Vital High Protein at 10 ml/hr, advance by 10 ml q 6 hours, to goal of 70 ml/hr daily.     Intervention Category Minor Interventions: Routine modifications to care plan (e.g. PRN medications for pain, fever)  Jenelle MagesAdam R. Gareld Obrecht 04/23/2013, 7:51 PM

## 2013-04-23 NOTE — Progress Notes (Signed)
CRITICAL VALUE ALERT  Critical value received:  Chem Profile Glucose 717  Date of notification:  04/23/2013  Time of notification:  0545  Critical value read back:yes  Nurse who received alert:  Rivka SpringKaylee Swanson RN  [facility] Gretchen recived lab value at 0555 on 04/23/2013.

## 2013-04-23 NOTE — Progress Notes (Signed)
eLink Physician-Brief Progress Note Patient Name: Tim BarerJames B Rucinski DOB: 06/24/1934 MRN: [id number]  Date of Service  04/23/2013   HPI/Events of Note   Was on heparin gtt prior to G tube insertion.  Plan for transition to xarelto.  Needs DVT prophylaxis.   eICU Interventions  Add SCD's.   Intervention Category Intermediate Interventions: Best-practice therapies (e.g. DVT, beta blocker, etc.)  [name] 04/23/2013, 2:33 AM

## 2013-04-24 LAB — BASIC METABOLIC PANEL
BUN: 27 mg/dL — AB (ref 6–23)
CALCIUM: 8.4 mg/dL (ref 8.4–10.5)
CO2: 26 meq/L (ref 19–32)
CREATININE: 0.77 mg/dL (ref 0.50–1.35)
Chloride: 100 mEq/L (ref 96–112)
GFR calc Af Amer: >90 mL/min (ref >90)
GFR, EST NON AFRICAN AMERICAN: 85 mL/min — AB (ref >90)
GLUCOSE: 114 mg/dL — AB (ref 70–99)
Potassium: 2.6 mEq/L — CL (ref 3.7–5.3)
SODIUM: 141 meq/L (ref 137–147)

## 2013-04-24 LAB — GLUCOSE, CAPILLARY
GLUCOSE-CAPILLARY: 134 mg/dL — AB (ref 70–99)
Glucose-Capillary: 117 mg/dL — ABNORMAL HIGH (ref 70–99)
Glucose-Capillary: 116 mg/dL — ABNORMAL HIGH (ref 70–99)
Glucose-Capillary: 119 mg/dL — ABNORMAL HIGH (ref 70–99)
Glucose-Capillary: 141 mg/dL — ABNORMAL HIGH (ref 70–99)
Glucose-Capillary: 137 mg/dL — ABNORMAL HIGH (ref 70–99)

## 2013-04-24 LAB — CBC
HCT: 22.8 % — ABNORMAL LOW (ref 39.0–52.0)
HEMOGLOBIN: 7.3 g/dL — AB (ref 13.0–17.0)
MCH: 29.6 pg (ref 26.0–34.0)
MCHC: 32 g/dL (ref 30.0–36.0)
MCV: 92.3 fL (ref 78.0–100.0)
Platelets: 129 10*3/uL — ABNORMAL LOW (ref 150–400)
RBC: 2.47 MIL/uL — AB (ref 4.22–5.81)
RDW: 19.1 % — ABNORMAL HIGH (ref 11.5–15.5)
WBC: 6.6 10*3/uL (ref 4.0–10.5)

## 2013-04-24 LAB — PHOSPHORUS: Phosphorus: 2.6 mg/dL (ref 2.3–4.6)

## 2013-04-24 LAB — MAGNESIUM: Magnesium: 2.1 mg/dL (ref 1.5–2.5)

## 2013-04-24 MED ORDER — POTASSIUM CHLORIDE 20 MEQ/15ML (10%) PO LIQD
ORAL | Status: AC
  Filled 2013-04-24: qty 30

## 2013-04-24 MED ORDER — POTASSIUM CHLORIDE 20 MEQ/15ML (10%) PO LIQD
40.0000 meq | ORAL | Status: AC
  Administered 2013-04-24 (×2): 40 meq
  Filled 2013-04-24 (×2): qty 30

## 2013-04-24 MED ORDER — POTASSIUM CHLORIDE 20 MEQ/15ML (10%) PO LIQD
40.0000 meq | ORAL | Status: AC
  Administered 2013-04-24 (×2): 40 meq
  Filled 2013-04-24 (×3): qty 30

## 2013-04-24 NOTE — Progress Notes (Signed)
PULMONARY / CRITICAL CARE MEDICINE   Name: Tim Short MRN: [id number] DOB: 04/09/1934    ADMISSION DATE:  03/28/2013 CONSULTATION DATE:  04/20/2013  REFERRING MD :  Dr. Vanessa BarbaraZamora PRIMARY SERVICE:  PCCM  CHIEF COMPLAINT:  AMS / Hypoxia   BRIEF PATIENT DESCRIPTION: 79 yo with complicated, prolonged illness since 09/2012 with respiratory failure, tracheostomy, dysphagia, cholecystitis s/p cholecystectomy, colon fistula, IVC filter placement, SBO, AF with RVR.  4/8 patient decompensated on medical floor with AMS and hypoxia.  PCCM consulted for evaluation.    SIGNIFICANT EVENTS / STUDIES:   09/2012 - [facility] failed MAIZE procedure, developed volvulus requiring R colectomy.  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 03/07/13 - Developed abd pain, distention at [facility].  T[facility] for gallbladder removal, resection of colon fistula, IVC filter placement 03/2013 - Returned back to [facility] 03/16/13, started on TPN.  Passed swallow eval 3/9 03/28/13 - Progressive abd pain / distension.  [facility] abdomen with early SBO/Ileus.  [facility] - SBO resolved.  Surgery s/o.  Developed Afib with RVR.  Cardioverted per Cardiology.  SR 3/28-3/29 with plan for d/c on 3/31 but went back into fib on 3/30 -on heparin gtt.  TPN continued.   04/20/13 - decompensated & Tx to ICU  LINES / TUBES: Trach (chronic)>>> LUE PICC 3/22>>>  CULTURES: Sputum 3/22>>> KLEBSIELLA BCx2 3/26>>>neg C-Diff 3/29>>>neg UC 4/2>>>neg C-Diff 4/4 >>> POSITIVE  ANTIBIOTICS: Flagyl 4/4>>>  SUBJECTIVE: 4/12: Weaning on PSV 10/5.  Awake and alert. Still has diarrhea with loose stool and mild diffused abdominal pain.   VITAL SIGNS: Temp:  [97.8 F (36.6 C)-99.1 F (37.3 C)] 99.1 F (37.3 C) (04/12 0800) Pulse  Rate:  [36-70] 58 (04/12 0855) Resp:  [16-28] 27 (04/12 0855) BP: (83-117)/(40-66) 94/53 mmHg (04/12 0855) SpO2:  [96 %-100 %] 97 % (04/12 0855) FiO2 (%):  [40 %] 40 % (04/12 0855) Weight:  [202 lb 2.6 oz (91.7 kg)] 202 lb 2.6 oz (91.7 kg) (04/12 0547)  HEMODYNAMICS: CV stable, no pressors    VENTILATOR SETTINGS: Vent Mode:  [-] PRVC FiO2 (%):  [40 %] 40 % Set Rate:  [16 bmp] 16 bmp Vt Set:  [600 mL] 600 mL PEEP:  [5 cmH20] 5 cmH20 Pressure Support:  [10 cmH20] 10 cmH20 Plateau Pressure:  [18 cmH20-20 cmH20] 19 cmH20  INTAKE / OUTPUT: Intake/Output     04/11 0701 - 04/12 0700 04/12 0701 - 04/13 0700   I.V. (mL/kg) 220 (2.4) 20 (0.2)   Other 60    NG/GT 85 10   IV Piggyback 200    TPN 744    Total Intake(mL/kg) 1309 (14.3) 30 (0.3)   Urine (mL/kg/hr) 1452 (0.7)    Stool     Total Output 1452     Net -143 +30        Stool Occurrence 3 x      PHYSICAL EXAMINATION: General:  Chronically ill appearing adult male in NAD on vent Neuro:  follows commands HEENT:  Mm pale, moist.  Trach midline (?portex) c/d/i Cardiovascular:  s1s2 irr irr, no m/r/g Lungs: coarse breath sounds b/l throughout Abdomen:  Round/soft, diffused mild tender to palpation,  bsx4 hypoactive Musculoskeletal:  No acute deformities Skin:  Warm/dry, trace edema in BLE  LABS:  CBC  Recent Labs  Lab 04/22/13 0311 04/23/13 0430 04/24/13 0342  WBC 8.8 6.5 6.6  HGB 7.5* 7.2* 7.3*  HCT 23.9* 22.0* 22.8*  PLT 150 134* 129*   Coag's  Recent Labs Lab 04/17/13 1100 04/18/13 0622 04/19/13 0620  APTT 72* 92* 61*  INR  --   --  1.07   BMET  Recent Labs Lab 04/22/13 2048 04/23/13 0430 04/24/13 0342  NA 142 136* 141  K 3.0* 4.0 2.6*  CL 100 97 100  CO2 27 25 26   BUN 31* 28* 27*  CREATININE 0.76 0.80 0.77  GLUCOSE 131* 717* 114*   Electrolytes  Recent Labs Lab 04/21/13 0300 04/22/13 0311 04/22/13 2048 04/23/13 0430 04/24/13 0342  CALCIUM 8.7 8.3* 8.2* 8.1* 8.4  MG 1.9 1.8  --   2.0  --   PHOS 2.4 2.7  --  4.8* 2.6   Sepsis Markers No results found for this basename: LATICACIDVEN, PROCALCITON, O2SATVEN,  in the last 168 hours ABG  Recent Labs Lab 04/20/13 0830 04/20/13 1027  PHART 7.122* 7.419  PCO2ART 92.1* 38.9  PO2ART 61.5* 60.0*   Liver Enzymes  Recent Labs Lab 04/18/13 0622 04/21/13 0300  AST 33 19  ALT 49 26  ALKPHOS 74 66  BILITOT <0.2* 0.3  ALBUMIN 2.2* 2.3*   Cardiac Enzymes No results found for this basename: TROPONINI, PROBNP,  in the last 168 hours Glucose  Recent Labs Lab 04/23/13 0417 04/23/13 0834 04/23/13 1153 04/23/13 1635 04/23/13 1958 04/24/13 0020  GLUCAP 165* 163* 185* 179* 131* 117*   IMAGING:   Ir Gastrostomy Tube Mod Sed  04/22/2013   INDICATION: History of colectomy following a volvulus, complicated by respiratory failure and subsequent tracheostomy tube placement. Request has been made for percutaneous gastrostomy tube placement secondary to dysphagia and poor enteric intake. Of note, patient has history of prior Nissen fundoplication surgery.  EXAM: PUSH GASTROSTOMY TUBE PLACEMENT  COMPARISON:  DG ABD PORTABLE 1V dated 04/22/2013; CT ABD/PELVIS W CM dated 04/04/2013  MEDICATIONS: Ancef 2 g IV.  Antibiotics were administered within 1 hour of the procedure.  CONTRAST:  40 mL of Isovue 300 administered into the gastric lumen.  ANESTHESIA/SEDATION: Conscious sedation was achieved with intravenous Versed and fentanyl.  Sedation time  20 minutes  FLUOROSCOPY TIME:  8 minutes 54 seconds.  COMPLICATIONS: None immediate  PROCEDURE: Informed written consent was obtained from the patient's family following explanation of the procedure, risks, benefits and alternatives. A time out was performed prior to the initiation of the procedure. Ultrasound scanning was performed to demarcate the edge of the left lobe of the liver. Thin barium was administered in a retrograde fashion to opacify the transverse colon. Maximal barrier sterile  technique utilized including caps, mask, sterile gowns, sterile gloves, large sterile drape, hand hygiene and Betadine prep.  The left upper quadrant was sterilely prepped and draped. A oral gastric catheter was inserted into the stomach under fluoroscopy. The existing nasogastric feeding tube was removed. The left costal margin and barium opacified transverse colon were identified and avoided. Air was injected into the stomach for insufflation and visualization under fluoroscopy. Under sterile conditions and local anesthesia, 3 T tacks were utilized to pexy the anterior aspect of the stomach against the ventral abdominal wall. Contrast injection confirmed appropriate positioning of each of the T tacks. An incision was made between the T tacks and a 17 gauge trocar needle was utilized to access the stomach. Needle position was confirmed within the stomach with aspiration of air and injection of  a small amount of contrast. A stiff Glidewire was advanced into the gastric lumen and under intermittent fluoroscopic guidance, the access needle was exchanged for a Kumpe catheter. With the use of the Kumpe catheter, a stiff Glidewire was advanced into the horizontal segment of the duodenum. Under intermittent fluoroscopic guidance, the Kumpe catheter was exchanged for a telescoping peel-away sheath, ultimately allowing placement of a 18-French balloon retention gastrostomy tube. The retention balloon was insufflated with a mixture of dilute saline and contrast and pulled taut against the anterior wall of the stomach. The external disc was cinched. Contrast injection confirms positioning within the stomach. Several spot radiographic images were obtained in various obliquities for documentation. T he patient tolerated procedure well without immediate post procedural complication.  FINDINGS: After successful fluoroscopic guided placement, the gastrostomy tube is appropriately positioned with internal retention balloon against  the ventral aspect of the gastric lumen.  IMPRESSION: Successful fluoroscopic insertion of an 27 French balloon retention gastrostomy tube.  The gastrostomy may be used immediately for medication administration and in 24 hrs for the initiation of feeds.   Electronically Signed   By: Simonne Come M.D.   On: 04/22/2013 14:22   ASSESSMENT / PLAN:  PULMONARY A: Acute on chronic respiratory failure, Mucus plug? Chronic Tracheostomy, OSA  Stable L effusion COPD? No acute bronchospasm P:   - Goal pH>7.30, SpO2>92. - Continuous mechanical support, wean as able, desaturated on TC. - VAP bundle. - Xopenex PRN. - No indication for systemic steroids.  CARDIOVASCULAR A:  AF / RVR, HTN, HLD CAD, s/p cath with PCI P:  - Amiodarone, Cardizem, Lopressor, Lipitor. - Lopressor for HR >110. - off heparin - cont  Xarelto for hx of PE and dvt proph  RENAL A:   HypoK: K=2.6-->repeated with 40 mEq KCl X 2 in AM-->will give 40 mEq of KCl x 2 at 18:00PM P:   - Trend BMP daily - Replete electrolytes   GASTROINTESTINAL A:   Dysphagia GERD C. Diff Pseudomembranous colitis P:   -PEG back in place -resumed TF, d/c'd TNA  HEMATOLOGIC A:   Chronic anemia  P:  - Trend CBC.  INFECTIOUS A:   Pseudomembranous colitis P:   - cont Flagyl.  ENDOCRINE A:   DM II At risk for adrenal insufficiency ( chronic low dose Prednisone ? ) P:   - SSI, change to q4h. - Hydrocortisone if hypotensive.  NEUROLOGIC A:   Acute encephalopathy P:   - D/c Xanax. - Morphine PRN.    Lorretta Harp, MD PGY3, Internal Medicine Teaching Service Pager: [phone]  Pt seen and examined with PGY3. I agree with the above note with edits as noted.  I have personally obtained history, examined patient, evaluated and interpreted laboratory and imaging results, reviewed medical records, formulated assessment / plan and placed orders.  CRITICAL CARE:  The patient is critically ill with multiple organ systems failure  and requires high complexity decision making for assessment and support, frequent evaluation and titration of therapies, application of advanced monitoring technologies and extensive interpretation of multiple databases. Critical Care Time devoted to patient care services described in this note is 35 minutes.   Charlcie Cradle WrightMD Beeper  [phone]  Cell  [phone]  If no response or cell goes to voicemail, call beeper [phone]  04/24/2013 10:30 AM

## 2013-04-24 NOTE — Progress Notes (Signed)
Patient sats were in the lower 80's, RT increased O2 from 40 to 60 and suctioned patient. Moderate amounts of white, thin secretions were obtained. Sats returned to 98 and FiO2 decreased back down to 40. Patient resting comfortably at this time.

## 2013-04-25 ENCOUNTER — Inpatient Hospital Stay (HOSPITAL_COMMUNITY): Payer: Medicare HMO

## 2013-04-25 DIAGNOSIS — Z515 Encounter for palliative care: Secondary | ICD-10-CM

## 2013-04-25 DIAGNOSIS — R5383 Other fatigue: Secondary | ICD-10-CM

## 2013-04-25 DIAGNOSIS — R5381 Other malaise: Secondary | ICD-10-CM

## 2013-04-25 LAB — BASIC METABOLIC PANEL
BUN: 27 mg/dL — AB (ref 6–23)
CO2: 28 mEq/L (ref 19–32)
Calcium: 8.7 mg/dL (ref 8.4–10.5)
Chloride: 107 mEq/L (ref 96–112)
Creatinine, Ser: 0.83 mg/dL (ref 0.50–1.35)
GFR, EST NON AFRICAN AMERICAN: 83 mL/min — AB (ref >90)
Glucose, Bld: 133 mg/dL — ABNORMAL HIGH (ref 70–99)
POTASSIUM: 3.9 meq/L (ref 3.7–5.3)
Sodium: 147 mEq/L (ref 137–147)

## 2013-04-25 LAB — CBC
HCT: 25.6 % — ABNORMAL LOW (ref 39.0–52.0)
Hemoglobin: 8 g/dL — ABNORMAL LOW (ref 13.0–17.0)
MCH: 29.5 pg (ref 26.0–34.0)
MCHC: 31.3 g/dL (ref 30.0–36.0)
MCV: 94.5 fL (ref 78.0–100.0)
Platelets: 152 10*3/uL (ref 150–400)
RBC: 2.71 MIL/uL — ABNORMAL LOW (ref 4.22–5.81)
RDW: 19.4 % — AB (ref 11.5–15.5)
WBC: 7 10*3/uL (ref 4.0–10.5)

## 2013-04-25 LAB — DIFFERENTIAL
BASOS ABS: 0 10*3/uL (ref 0.0–0.1)
BASOS PCT: 0 % (ref 0–1)
EOS ABS: 0 10*3/uL (ref 0.0–0.7)
Eosinophils Relative: 1 % (ref 0–5)
Lymphocytes Relative: 17 % (ref 12–46)
Lymphs Abs: 1.2 10*3/uL (ref 0.7–4.0)
Monocytes Absolute: 0.6 10*3/uL (ref 0.1–1.0)
Monocytes Relative: 8 % (ref 3–12)
Neutro Abs: 5.3 10*3/uL (ref 1.7–7.7)
Neutrophils Relative %: 74 % (ref 43–77)

## 2013-04-25 LAB — GLUCOSE, CAPILLARY
GLUCOSE-CAPILLARY: 129 mg/dL — AB (ref 70–99)
Glucose-Capillary: 139 mg/dL — ABNORMAL HIGH (ref 70–99)
Glucose-Capillary: 124 mg/dL — ABNORMAL HIGH (ref 70–99)
Glucose-Capillary: 157 mg/dL — ABNORMAL HIGH (ref 70–99)
Glucose-Capillary: 161 mg/dL — ABNORMAL HIGH (ref 70–99)
Glucose-Capillary: 167 mg/dL — ABNORMAL HIGH (ref 70–99)

## 2013-04-25 LAB — TRIGLYCERIDES: Triglycerides: 90 mg/dL (ref <150)

## 2013-04-25 LAB — PHOSPHORUS: PHOSPHORUS: 2.9 mg/dL (ref 2.3–4.6)

## 2013-04-25 LAB — PREALBUMIN: PREALBUMIN: 18.5 mg/dL (ref 17.0–34.0)

## 2013-04-25 LAB — MAGNESIUM: Magnesium: 2.1 mg/dL (ref 1.5–2.5)

## 2013-04-25 MED ORDER — VITAL 1.5 CAL PO LIQD
1000.0000 mL | ORAL | Status: DC
  Administered 2013-04-26 – 2013-04-29 (×4): 1000 mL
  Filled 2013-04-25 (×8): qty 1000

## 2013-04-25 NOTE — Progress Notes (Signed)
PULMONARY / CRITICAL CARE MEDICINE   Name: Tim Short MRN: [id number] DOB: 01/26/1934    ADMISSION DATE:  03/28/2013 CONSULTATION DATE:  04/20/2013  REFERRING MD :  Dr. Vanessa Barbara PRIMARY SERVICE:  PCCM  CHIEF COMPLAINT:  AMS / Hypoxia   BRIEF PATIENT DESCRIPTION: 79 y/o with complicated, prolonged illness since 09/2012 with respiratory failure, tracheostomy, dysphagia, cholecystitis s/p cholecystectomy, colon fistula, IVC filter placement, SBO, AF with RVR.  4/8 patient decompensated on medical floor with AMS and hypoxia.  PCCM consulted for evaluation.    SIGNIFICANT EVENTS / STUDIES:  09/2012 - Admit at [facility] for failed MAIZE procedure, developed volvulus requiring R colectomy.  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 03/07/13 - Developed abd pain, distention at [facility].  Taken to OR in [facility] for gallbladder removal, resection of colon fistula, IVC filter placement 03/2013 - Returned back to [facility] 03/16/13, started on TPN.  Passed swallow eval 3/9 .............................................................................................................................................................................................................................................. 3/16 - Progressive abd pain / distension.  Tx to [facility] with CT abdomen with early SBO/Ileus.  Admitted by [facility]. 04/08/13 - SBO resolved.  Surgery s/o.  Developed Afib with RVR.  Cardioverted per Cardiology.  SR 3/28-3/29 with plan for d/c on 3/31 but went back into fib on 3/30 -on heparin gtt.  TPN continued.   4/08 - decompensated & Tx to ICU 4/12 - Weaning on PSV 10/5.  Awake and alert. Still has diarrhea with loose stool and mild diffused abdominal pain. Weaned on 40% ATC for 18 hours 4/13 - GOC meeting planned with Palliative  care.  LINES / TUBES: Trach (chronic)>>> LUE PICC 3/22>>>  CULTURES: Sputum 3/22>>> KLEBSIELLA BCx2 3/26>>>neg C-Diff 3/29>>>neg UC 4/2>>>neg C-Diff 4/4 >>> POSITIVE  ANTIBIOTICS: Flagyl 4/4 (Cdiff)>>>  SUBJECTIVE:  RN reports pt weaned on 40% ATC x 18 hours, remains without distress. Pt denies acute c/o's  VITAL SIGNS: Temp:  [97.3 F (36.3 C)-98.7 F (37.1 C)] 98.1 F (36.7 C) (04/13 1158) Pulse Rate:  [59-76] 66 (04/13 1223) Resp:  [22-35] 35 (04/13 1223) BP: (89-117)/(52-71) 105/54 mmHg (04/13 1220) SpO2:  [81 %-100 %] 94 % (04/13 1223) FiO2 (%):  [40 %] 40 % (04/13 1223)  VENTILATOR SETTINGS: Vent Mode:  [-]  FiO2 (%):  [40 %] 40 %  INTAKE / OUTPUT: Intake/Output     04/12 0701 - 04/13 0700 04/13 0701 - 04/14 0700   I.V. (mL/kg) 380 (4.1) 10 (0.1)   Other     NG/GT 640    IV Piggyback 300    TPN     Total Intake(mL/kg) 1320 (14.4) 10 (0.1)   Urine (mL/kg/hr) 125 (0.1)    Stool 3 (0)    Total Output 128     Net +1192 +10        Urine Occurrence 1 x    Stool Occurrence 3 x      PHYSICAL EXAMINATION: General:  Chronically ill appearing adult male in NAD  Neuro:  AA, follows commands, appropriate HEENT:  Mm pale, moist.  Trach midline (?portex) c/d/i Cardiovascular:  s1s2 irr irr, no m/r/g Lungs: mild tachypnea, lungs bilaterally diminished with few faint scattered wheezes Abdomen:  Round/soft, diffused mild tender to palpation,  bsx4 hypoactive Musculoskeletal:  No acute deformities Skin:  Warm/dry, trace edema in BLE  LABS:  CBC  Recent Labs Lab 04/23/13 0430 04/24/13 0342 04/25/13 0500  WBC 6.5 6.6  7.0  HGB 7.2* 7.3* 8.0*  HCT 22.0* 22.8* 25.6*  PLT 134* 129* 152   Coag's  Recent Labs Lab 04/19/13 0620  APTT 61*  INR 1.07   BMET  Recent Labs Lab 04/23/13 0430 04/24/13 0342 04/25/13 0500  NA 136* 141 147  K 4.0 2.6* 3.9  CL 97 100 107  CO2 25 26 28   BUN 28* 27* 27*  CREATININE 0.80 0.77 0.83  GLUCOSE 717* 114* 133*    Electrolytes  Recent Labs Lab 04/23/13 0430 04/24/13 0342 04/24/13 1815 04/25/13 0500  CALCIUM 8.1* 8.4  --  8.7  MG 2.0  --  2.1 2.1  PHOS 4.8* 2.6  --  2.9   Liver Enzymes  Recent Labs Lab 04/21/13 0300  AST 19  ALT 26  ALKPHOS 66  BILITOT 0.3  ALBUMIN 2.3*   Glucose  Recent Labs Lab 04/24/13 1630 04/24/13 1956 04/24/13 2357 04/25/13 0435 04/25/13 0750 04/25/13 1149  GLUCAP 134* 137* 129* 139* 124* 157*   IMAGING:  No results found.    ASSESSMENT / PLAN:  PULMONARY A: Acute on chronic respiratory failure - ?mucus plug as initial event that prompted tx to ICU Chronic Tracheostomy, OSA  Stable L effusion COPD? No acute bronchospasm P:   - Wean on ATC as tolerated, may require PRN nocturnal support - Xopenex Q6 PRN - No indication for systemic steroids  CARDIOVASCULAR A:  AF / RVR, HTN, HLD CAD, s/p cath with PCI P:  - Amiodarone, Cardizem, Lopressor, Lipitor. - Lopressor for HR >110. - cont Xarelto for hx of PE and dvt proph  RENAL A:   Hypokalemia  P:   - Trend BMP daily - Replete electrolytes as indicated  GASTROINTESTINAL A:   Dysphagia GERD C-Diff Pseudomembranous colitis P:   -TF via PEG - swallow eval once on ATC >48 hours - d/c TNA labs  HEMATOLOGIC A:   Chronic anemia P:  - Trend CBC  INFECTIOUS A:   Pseudomembranous colitis P:   - cont Flagyl  Through 4/18  ENDOCRINE A:   DM II At risk for adrenal insufficiency - on chronic low dose Prednisone for unclear reasons P:   - SSI, change to q4h  NEUROLOGIC A:   Acute encephalopathy P:   - Morphine PRN - Mobilize, PT efforts as able   GLOBAL: -pt will need MD to MD peer review for potential [facility].  Needs approval from [employer] @ [phone] (appeals) [facility] does have bed available 4/13    Canary BrimBrandi Ollis, NP-C [place] [facility] Pgr: [phone] or [phone]   Attending:  I have seen and examined the patient with nurse  practitioner/resident and agree with the note above.   Lengthy conversation with Mr. Alford HighlandMcLean's son Onalee HuaDavid who happens to practice elder law so is well versed in dealing with patients who have required lengthy medical care at this level.  I asked him specifically to address the issue of goals of care.  He says that he thinks that his father would be OK with a few more weeks of vent weaning, but he thinks that he would not want to be vent dependent for life.  He will address this with his father more.  At this point I don't see a chronic pulmonary (ie. Lung tissue) problem that is keeping him on the ventilator.  The bigger issue is his generalized weakness and poor nutrition.  So now that he has a PEG for nutrition I think it is reasonable to continue  with plans for weaning.    Check CXR today, proBNP today  Heber CarolinaBrent Marylen Zuk, MD [place] [facility] Pager: [phone] Cell: [phone] If no response, call [phone]   04/25/2013 1:29 PM

## 2013-04-25 NOTE — Progress Notes (Signed)
Placed back on vent per MD request.  Pt appeared SOB, SPO2 92% on 90% ATC

## 2013-04-25 NOTE — Progress Notes (Signed)
SLP Cancellation Note  Patient Details Name: Tim Short MRN: [id number] DOB: 12/07/1934   Cancelled treatment:       Reason Eval/Treat Not Completed: Medical issues which prohibited therapy. Per chart pt is still weaning from vent, now has PEG tube in place. SLP will f/u later this week.    Riley NearingBonnie Caroline Fenix Short 04/25/2013, 7:23 AM

## 2013-04-25 NOTE — Progress Notes (Signed)
NUTRITION FOLLOW UP  DOCUMENTATION CODES  Per approved criteria   -Obesity Unspecified    INTERVENTION: 1.  Enteral nutrition; transition to Vital 1.5 @ 60 mL/hr continuous to provide 2160 kcal, 97g protein (97% estimated protein needs), 1100 mL free water.  NUTRITION DIAGNOSIS: Inadequate oral intake now related to dysphagia, poor appetite as evidenced by 0-20%, ongoing  Goal: Pt to meet >/= 90% of their estimated nutrition needs, progressing PO, currently being met with TPN.  Monitor:  TPN prescription, PO intake, weight, labs, I/O's  ASSESSMENT: 79 yo male with recent complications at [facility] after an ablation for afib in Oct 2014 he developed a volvulus after the ablation was intubated/trached; required multiple abd surgeries then developed fistula.   Has been on TPN, with NGT on/off since. Is at [facility] and sent to [facility] for complaints of abd pain. Pt was taken off TPN and NGT was removed last week. He has been eating by mouth and doing well. Last 4 days with worsening abd pain, no N/V/D.  Pt has weaned to trach collar.  TFs have been started and TPN has been discontinued.   Pt endorses nausea and abdominal pain.  He is having BMs.   RD notes a [facility] meeting has been scheduled with pt and son for this afternoon.   Pt is currently not meeting calorie needs, however, complaining of constant abdominal pain and nausea making advancements difficult.   Height: Ht Readings from Last 1 Encounters:  03/30/13 5' 8.9" (1.75 m)    Weight -----> trending up, but overall stable Wt Readings from Last 1 Encounters:  04/24/13 202 lb 2.6 oz (91.7 kg)  3/30  186 lb 3/29  191 lb 3/27  194 lb 3/26  192 lb 3/24  195 lb 3/18  203 lb 3/17  203 lb  BMI:  Body mass index is 29.94 kg/(m^2). Overweight  Re-estimated needs: Calories: 2020-2300 Protein: 100-120 gm Fluid: 2.0-2.2 L  Skin:  stage II pressure ulcer to L + R buttocks Closed R abdominal incision Open mid-lower abdominal  incision  Diet Order: NPO   Intake/Output Summary (Last 24 hours) at 04/25/13 1055 Last data filed at 04/25/13 0951  Gross per 24 hour  Intake   1140 ml  Output     27 ml  Net   1113 ml   Last BM: 4/6 (diarrhea, c diff positive)  Labs:   Recent Labs Lab 04/23/13 0430 04/24/13 0342 04/24/13 1815 04/25/13 0500  NA 136* 141  --  147  K 4.0 2.6*  --  3.9  CL 97 100  --  107  CO2 25 26  --  28  BUN 28* 27*  --  27*  CREATININE 0.80 0.77  --  0.83  CALCIUM 8.1* 8.4  --  8.7  MG 2.0  --  2.1 2.1  PHOS 4.8* 2.6  --  2.9  GLUCOSE 717* 114*  --  133*    CBG (last 3)   Recent Labs  04/24/13 2357 04/25/13 0435 04/25/13 0750  GLUCAP 129* 139* 124*   Prealbumin  Date/Time Value Ref Range Status  04/18/2013  6:22 AM 18.6  17.0 - 34.0 mg/dL Final     Performed at [facility]   Triglycerides  Date/Time Value Ref Range Status  04/25/2013  5:00 AM 90  <150 mg/dL Final    Scheduled Meds: . amiodarone  200 mg Oral BID  . antiseptic oral rinse  15 mL Mouth Rinse QID  . atorvastatin  40  mg Oral Daily  . chlorhexidine  15 mL Mouth Rinse BID  . diltiazem  120 mg Oral TID WC & HS  . hydrocortisone cream   Topical BID  . insulin aspart  0-15 Units Subcutaneous 6 times per day  . metoprolol  2.5 mg Intravenous 4 times per day  . metoprolol tartrate  12.5 mg Oral BID  . metronidazole  500 mg Intravenous Q8H  . rivaroxaban  20 mg Oral Q supper  . sodium chloride  10-40 mL Intracatheter Q12H    Continuous Infusions: . sodium chloride 10 mL/hr at 04/23/13 2200  . feeding supplement (VITAL HIGH PROTEIN) 1,000 mL (04/24/13 2019)    Brynda Greathouse, MS RD LDN Clinical Inpatient Dietitian Pager: [phone] Weekend/After hours pager: [phone]

## 2013-04-25 NOTE — Progress Notes (Signed)
Palliative GOC meeting scheduled 04/25/13 1400 confirmed with son, Onalee HuaDavid [phone]). Thank you for this consult.   Yong ChannelAlicia Halynn Reitano, NP Palliative Medicine Team Pager # [phone] (M-F 8a-5p) Team Phone # [phone] (Nights/Weekends)

## 2013-04-25 NOTE — Progress Notes (Signed)
Occupational Therapy Treatment Patient Details Name: Tim BarerJames B Ackert MRN: [id number] DOB: 06/02/1934 Today's Date: 04/25/2013    History of present illness 79 yo male with recent complications at [facility] after an [facility] for afib in oct 2014 he developed a volvulus after the ablation was intubated/trachedm required multiple abd surgeries then developed fistula details unknown. Has been on tpn, with ngt on/off since. Is at [facility] sent here for complaints of abd pain.4/6 rapid reponse for increaesed O2 needs and moved to [facility]. 4/10 G-tube placement Pallative care meeting 04/25/13 at 14:00 with pt and son.   OT comments  This 79 yo had started making progress (due to lessened dizziness) but then had a medical set-back starting on 4/7 15 and so is not progressing towards originally set goals. This session 3 goals were discontinued (LBB, UBD, and LBD) and grooming, UBB, and toileting transfers goals were revised. Pt currently still limited by dizziness, but not as bad as it was prior to PT treating him for vestibular issues. Goals of care meeting today at 14:00.   Follow Up Recommendations  LTACH    Equipment Recommendations   (TBD on next venue)       Precautions / Restrictions Precautions Precautions: Fall Precaution Comments: C/o dizziness, but not right away--hand been up on EOB about 2 minutes., VERY HOH Restrictions Weight Bearing Restrictions: No       Mobility Bed Mobility Overal bed mobility: Needs Assistance;+2 for physical assistance Bed Mobility: Rolling;Supine to Sit;Sit to Supine Rolling: Min assist (with rails)   Supine to sit: Mod assist;+2 for physical assistance;HOB elevated Sit to supine: Mod assist;+2 for physical assistance   General bed mobility comments: pt moved his legs over to the left hand side of bed by himself, but then needed the +2 A for his trunk and to scoot forward to EOB     Balance Overall balance assessment: Needs assistance Sitting-balance support:  Feet supported;Bilateral upper extremity supported Sitting balance-[name] Scale: Poor Sitting balance - Comments: tendency to go to the left (however feel this is somewhat due to the where he was sitting on the mattress). He did work on forward and backward movements of trunk while I held his two hands (15 reps), side to side movements while I held his two hands (15 reps) and 5 reps of coming down on his right elbow anc back up to midline Postural control: Left lateral lean;Posterior lean                         ADL Overall ADL's : Needs assistance/impaired                                       General ADL Comments: Pt still limited by dizziness (no nausea). Did work on activity EOB today                Cognition   Behavior During Therapy: WFL for tasks assessed/performed Overall Cognitive Status: Difficult to assess                                    Pertinent Vitals/ Pain       O2 sats down to 87% at end of session with pt on trach collar 40%/10 liters; Diplomatic Services operational officersecretary made aware so she could make RN aware.  Frequency Min 2X/week     Progress Toward Goals  OT Goals(current goals can now be found in the care plan section)  Progress towards OT goals: Not progressing toward goals - comment (has had medical set back since last seen)  ADL Goals Pt Will Perform Grooming: with min assist;sitting (1 task) Pt Will Perform Upper Body Bathing: with min assist;sitting Pt Will Transfer to Toilet: with +2 assist;with mod assist;bedside commode;stand pivot transfer;squat pivot transfer  Plan Discharge plan remains appropriate          Activity Tolerance  (limited by dizziness)   Patient Left in bed;with call bell/phone within reach   Nurse Communication  (pt requests suctioning and tube feed bottle is empty)        Time: 2952-84131128-1147 OT Time Calculation (min): 19 min  Charges: OT General Charges $OT Visit: 1 Procedure OT  Treatments $Therapeutic Activity: 8-22 mins  Evette GeorgesCatherine Eva Modene Andy [phone] 04/25/2013, 12:11 PM

## 2013-04-25 NOTE — Consult Note (Signed)
Patient Tim Short:Tim Short      DOB: 07/23/1934      AVW:[id number]     [facility]    Consult Requested by: Dr Delford FieldWright     PCP:  Duane Lopeoss, Alan, MD Reason for Consultation: Clarification of GOC and options     Phone Number:[phone]  Assessment of patients Current state:  Complicated medical/physical situation over the past six months, unable to successfully wean for ventilator for any length of time, overall failure to thrive.  Faced with advanced directive questions and anticipatory care needs.   Consult is for review of medical treatment options, clarification of goals of care and end of life issues, disposition and options, and symptom recommendation.  This NP Lorinda CreedMary Larach reviewed medical records, received report from team, assessed the patient and then meet at the patient's bedside along with his son/HPOA Tim PortsDavid Short #[phone] to discuss diagnosis prognosis, GOC, EOL wishes disposition and options.  A detailed discussion was had today regarding advanced directives.  Concepts specific to code status, artifical feeding and hydration, continued IV antibiotics and rehospitalization was had.  The difference between a aggressive medical intervention path  and a palliative comfort care path for this patient at this time was had.  Values and goals of care important to patient and family were attempted to be elicited.  Concept of Hospice and Palliative Care were discussed  Natural trajectory and expectations at EOL were discussed.  Questions and concerns addressed.  Hard Choices booklet left for review. Family encouraged to call with questions or concerns.  PMT will continue to support holistically.   Goals of Care: 1.  Code Status:  Full code  2. Scope of Treatment: Patient's son/HPOA presently is hopeful for return to a quality of life that his father would find acceptable.  He understands that living alone in his apartment is "never going to  happen", but if the patient was able to live assisted in a SNF he believes this would be acceptable.  He needs more time to see if his father can liberate from the vent and continue to rehab.  He is hopful that the medical teams will continue to direct him and advise him if things are "futile".  3. Disposition  Dependant on outcomes,  Family is hopeful for LTAC in hopes that more time will result in improvement   4. Symptom Management:   1. Weakness:  Continue medical management of treatable illnesses in hopes of improvement  5. Psychosocial:  Emotional support offered to son at bedside.  His work is in [employer].  He expresses difficulty in "knowing" when the right time to de-escalate care is right.  We discussed the path of comfort vs cure.  PMT will continue to support holistically.   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 [facility], unable to wean of the vent and requiring trach placement 10/29/2012. He was transferred to [facility] in December 2014 and later to [facility] in late January 2015. He was on TNA and  was doing fairly well initially, but developed more abdominal pain and on 2/23 taken to OR in [place] for gallbladder removal (secondary to cholecystitis), resection for part of the colon for fistula, IVC filter placement. Sent back to [facility] on 03/16/13 and started on TPN. Diet advanced on March 9th, 2015 after pt passed swallow evaluation. He was brought to [facility] March 16th, after noticing progressive abdominal distension. In ED, CT  abdomen with air- fluid level and findings consistent with early partial SBO, ileus.  Patient admitted to [facility] service 03/29/2013 with surgical consult, his SBO slowly resolved and surgery signed off 3/27. Throughout his hospitalization patient developed Atrial fibrillation with RVR to rated of 130s-140s, stable, and cardiology was consulted. Patient had several up titration of his medications including addition of Amiodarone, and was d/c cardioverted on 3/27. He maintained sinus rhythm through 3/28-3/29 with plans for discharge on 3/31, however on 3/30 went back into A fib with RVR and cardiology has been re consulted.  Electrophysiology was consulted and the patient's Cardizem CD was increased to 240 mg twice a day. His heart rate improved from 130s to 120s. Metoprolol tartrate was started with improvement of the heart rate to 110s. Cardiology has signed off but may need to be reconsulted. The patient was initially started on TPN with hopes that this would be temporary. However there is no significant improvement in the patient's nutritional or clinical status. As a result, IR was consulted for gastrostomy tube placement. It is hopeful that this will be placed on 04/20/2013. He was initially scheduled for 04/19/2013, but contrast transit beyond the stomach was slow. The patient's insurance has denied LTAC transfer. Case management is working on appealing this as well as the patient's son. In the last 24 hours, the patient has an increasing frothy tracheal secretions. Chest x-ray revealed increasing vascular congestion. The patient was started on intravenous Lasix.  Patient seems a little better today, he is able to nod appropriately to my questions, following commands, awake and alert. Remains on intermittently on vent support, attempting to wean.  ROS: denies pain, difficult to communicate secondary to hearing loss   PMH:  Past Medical History  Diagnosis Date  . Hypertension   . Hypercholesteremia   .  Asthma   . Meniere disease   . Persistent atrial fibrillation     a. s/p multiple dccv's;  b. failed amio;  c. not felt to be AF RFCA canddiate due to LA dil;  d. s/p failed hybrid ablation @ [facility] in 09/2012;  e. chronic pradaxa.  . Obesity   . Biatrial enlargement     LA size 5.3cm  . Obstructive sleep apnea     AHI 108/hr now on CPAP at 12cm H2O  . H/O hiatal hernia   . GERD (gastroesophageal reflux disease)     "associated w/hiatal hernia" (June 24, 2012)  . Peptic ulcer 1950's  . Migraines     "haven't had one for about 15 years" (June 24, 2012)  . Arthritis     "joints" (June 24, 2012)  . Chronic lower back pain   . Nephrolithiasis ~ 1960's    "passed on their own" (06/24/12)  . History of pneumonia 2002; 2006    "spent 8 days in isolation; Norovirus" (06-24-2012)  . Other and unspecified angina pectoris   . RLS (restless legs syndrome)   . Coronary artery disease     a. 05/2012 Cath/PCI: LM nl, LAD nl, LCX 75m (3.0x15 Integrity BMS), PTCA of OM1 through stent struts (kissing balloon).  . Diabetes  mellitus without complication     FAMILY STATES PATIENT IS NOT DIABETIC     PSH: Past Surgical History  Procedure Laterality Date  . Wrist fracture surgery  1992    "repaired w/left hip bone graft" (06/04/2012)  . Total knee arthroplasty  08/19/1999  . Colonoscopy w/ polypectomy  2006  . Knee arthroscopy with meniscal repair Right 1974    "medial meniscus repaired" (06/04/2012)  . Cardioversion  10/02/2011    Procedure: CARDIOVERSION;  Surgeon: Corky CraftsJayadeep S. Varanasi, MD;  [facility];  Service: Cardiovascular;  Laterality: N/A;  h/p in file drawer  . Cardioversion  11/07/2011    Procedure: CARDIOVERSION;  Surgeon: Corky CraftsJayadeep S. Varanasi, MD;  [facility];  Service: Cardiovascular;  Laterality: N/A;  h/p from 10/22 in file drawer/dl  . Cardiac catheterization  05/26/2012  . Coronary angioplasty with stent placement  06/04/2012    "1" (06/04/2012)  . Cataract extraction w/  intraocular lens  implant, bilateral  2009  . Hemorrhoid surgery  ~ 2003  . Hiatal hernia repair  1983  . Nissen fundoplication  1983  . Ablation of dysrhythmic focus  SEPT/OCT 2014    ATRIAL FIB  . Inguinal hernia repair Bilateral 2000 and 2005    "one done at a time" (06/04/2012)  . Colon surgery  10/08/2012   . Tracheostomy  OCT 2014  . Cardioversion N/A 04/08/2013    Procedure: CARDIOVERSION    (BEDSIDE) ;  Surgeon: Luis AbedJeffrey D Katz, MD;  [facility];  Service: Cardiovascular;  Laterality: N/A;   I have reviewed the FH and SH and  If appropriate update it with new information. Allergies  Allergen Reactions  . Corticosteroids     Inhaled Corticosteroids--Hoarseness, dry mouth  . Furosemide Other (See Comments)    Ototoxicity    Scheduled Meds: . amiodarone  200 mg Oral BID  . antiseptic oral rinse  15 mL Mouth Rinse QID  . atorvastatin  40 mg Oral Daily  . chlorhexidine  15 mL Mouth Rinse BID  . diltiazem  120 mg Oral TID WC & HS  . hydrocortisone cream   Topical BID  . insulin aspart  0-15 Units Subcutaneous 6 times per day  . metoprolol  2.5 mg Intravenous 4 times per day  . metoprolol tartrate  12.5 mg Oral BID  . metronidazole  500 mg Intravenous Q8H  . rivaroxaban  20 mg Oral Q supper  . sodium chloride  10-40 mL Intracatheter Q12H   Continuous Infusions: . sodium chloride 10 mL/hr at 04/23/13 2200  . feeding supplement (VITAL HIGH PROTEIN) 1,000 mL (04/25/13 1226)   PRN Meds:.acetaminophen (TYLENOL) oral liquid 160 mg/5 mL, alum & mag hydroxide-simeth, levalbuterol, metoprolol, midazolam, morphine injection, naphazoline, nitroGLYCERIN, ondansetron (ZOFRAN) IV, sodium chloride, vitamin A & D    BP 105/54  Pulse 66  Temp(Src) 98.1 F (36.7 C) (Oral)  Resp 35  Ht 5' 8.9" (1.75 m)  Wt 91.7 kg (202 lb 2.6 oz)  BMI 29.94 kg/m2  SpO2 94%   PPS: 30 % at best   Intake/Output Summary (Last 24 hours) at 04/25/13 1336 Last data filed at 04/25/13 0951  Gross per  24 hour  Intake   1030 ml  Output     25 ml  Net   1005 ml    Physical Exam:  General:chronically ill appearing, NAD HEENT: trach noted/midline/site WNL Chest:   Decreased in bases CVS: RRR Abdomen: decreased BS RLQ Ext: trace BLE edema Neuro: awake, alert, follows commands,  easily anxious  Labs: CBC    Component Value Date/Time   WBC 7.0 04/25/2013 0500   RBC 2.71* 04/25/2013 0500   HGB 8.0* 04/25/2013 0500   HCT 25.6* 04/25/2013 0500   PLT 152 04/25/2013 0500   MCV 94.5 04/25/2013 0500   MCH 29.5 04/25/2013 0500   MCHC 31.3 04/25/2013 0500   RDW 19.4* 04/25/2013 0500   LYMPHSABS 1.2 04/25/2013 0500   MONOABS 0.6 04/25/2013 0500   EOSABS 0.0 04/25/2013 0500   BASOSABS 0.0 04/25/2013 0500    BMET    Component Value Date/Time   NA 147 04/25/2013 0500   K 3.9 04/25/2013 0500   CL 107 04/25/2013 0500   CO2 28 04/25/2013 0500   GLUCOSE 133* 04/25/2013 0500   BUN 27* 04/25/2013 0500   CREATININE 0.83 04/25/2013 0500   CALCIUM 8.7 04/25/2013 0500   GFRNONAA 83* 04/25/2013 0500   GFRAA >90 04/25/2013 0500    CMP     Component Value Date/Time   NA 147 04/25/2013 0500   K 3.9 04/25/2013 0500   CL 107 04/25/2013 0500   CO2 28 04/25/2013 0500   GLUCOSE 133* 04/25/2013 0500   BUN 27* 04/25/2013 0500   CREATININE 0.83 04/25/2013 0500   CALCIUM 8.7 04/25/2013 0500   PROT 5.1* 04/21/2013 0300   ALBUMIN 2.3* 04/21/2013 0300   AST 19 04/21/2013 0300   ALT 26 04/21/2013 0300   ALKPHOS 66 04/21/2013 0300   BILITOT 0.3 04/21/2013 0300   GFRNONAA 83* 04/25/2013 0500   GFRAA >90 04/25/2013 0500    Time In Time Out Total Time Spent with Patient Total Overall Time  1345 1545 110 min 120 min    Greater than 50%  of this time was spent counseling and coordinating care related to the above assessment and plan.   Lorinda Creed NP  Palliative Medicine Team Team Phone # [phone] Pager [phone]  Discussed with Dr Kendrick Fries

## 2013-04-25 NOTE — Progress Notes (Signed)
RNCM and CSW spoke with pt's son. Explained role of both disciplines. MD to completed peer-to-peer with insurance company for [facility]. Son understanding.   Maryclare LabradorJulie Lynnix Schoneman, MSW, [facility] CenterCSWA Clinical Social Worker [phone]

## 2013-04-26 DIAGNOSIS — R531 Weakness: Secondary | ICD-10-CM

## 2013-04-26 DIAGNOSIS — E46 Unspecified protein-calorie malnutrition: Secondary | ICD-10-CM

## 2013-04-26 DIAGNOSIS — Z515 Encounter for palliative care: Secondary | ICD-10-CM

## 2013-04-26 LAB — BASIC METABOLIC PANEL
BUN: 32 mg/dL — AB (ref 6–23)
CO2: 24 meq/L (ref 19–32)
Calcium: 8 mg/dL — ABNORMAL LOW (ref 8.4–10.5)
Chloride: 112 mEq/L (ref 96–112)
Creatinine, Ser: 0.76 mg/dL (ref 0.50–1.35)
GFR calc Af Amer: >90 mL/min (ref >90)
GFR calc non Af Amer: 86 mL/min — ABNORMAL LOW (ref >90)
GLUCOSE: 149 mg/dL — AB (ref 70–99)
POTASSIUM: 3.4 meq/L — AB (ref 3.7–5.3)
SODIUM: 148 meq/L — AB (ref 137–147)

## 2013-04-26 LAB — CBC
HCT: 24 % — ABNORMAL LOW (ref 39.0–52.0)
HEMOGLOBIN: 7.4 g/dL — AB (ref 13.0–17.0)
MCH: 29.2 pg (ref 26.0–34.0)
MCHC: 30.8 g/dL (ref 30.0–36.0)
MCV: 94.9 fL (ref 78.0–100.0)
Platelets: 142 10*3/uL — ABNORMAL LOW (ref 150–400)
RBC: 2.53 MIL/uL — ABNORMAL LOW (ref 4.22–5.81)
RDW: 19.3 % — ABNORMAL HIGH (ref 11.5–15.5)
WBC: 6.5 10*3/uL (ref 4.0–10.5)

## 2013-04-26 LAB — GLUCOSE, CAPILLARY
GLUCOSE-CAPILLARY: 138 mg/dL — AB (ref 70–99)
Glucose-Capillary: 147 mg/dL — ABNORMAL HIGH (ref 70–99)
Glucose-Capillary: 141 mg/dL — ABNORMAL HIGH (ref 70–99)
Glucose-Capillary: 186 mg/dL — ABNORMAL HIGH (ref 70–99)
Glucose-Capillary: 148 mg/dL — ABNORMAL HIGH (ref 70–99)
Glucose-Capillary: 127 mg/dL — ABNORMAL HIGH (ref 70–99)

## 2013-04-26 LAB — PRO B NATRIURETIC PEPTIDE: Pro B Natriuretic peptide (BNP): 1181 pg/mL — ABNORMAL HIGH (ref 0–450)

## 2013-04-26 MED ORDER — METOPROLOL TARTRATE 25 MG PO TABS
25.0000 mg | ORAL_TABLET | Freq: Two times a day (BID) | ORAL | Status: DC
  Administered 2013-04-26 – 2013-05-04 (×17): 25 mg via ORAL
  Filled 2013-04-26 (×18): qty 1

## 2013-04-26 MED ORDER — POTASSIUM CHLORIDE 20 MEQ/15ML (10%) PO LIQD
40.0000 meq | Freq: Three times a day (TID) | ORAL | Status: AC
  Administered 2013-04-26 (×3): 40 meq
  Filled 2013-04-26 (×3): qty 30

## 2013-04-26 MED ORDER — CHLORTHALIDONE 50 MG PO TABS
50.0000 mg | ORAL_TABLET | Freq: Every day | ORAL | Status: DC
  Administered 2013-04-26: 50 mg via ORAL
  Filled 2013-04-26 (×2): qty 1

## 2013-04-26 MED ORDER — SPIRONOLACTONE 50 MG PO TABS
50.0000 mg | ORAL_TABLET | Freq: Every day | ORAL | Status: DC
  Administered 2013-04-26: 50 mg via ORAL
  Filled 2013-04-26 (×2): qty 1

## 2013-04-26 NOTE — Progress Notes (Signed)
Pt placed on trach collar at 60% due to desaturations. Pt cuff deflated and pt tolerating atc well. No concerns as of now.

## 2013-04-26 NOTE — Progress Notes (Signed)
Physical Therapy Treatment Patient Details Name: Tim Short MRN: [id number] DOB: 02/11/1934 Today's Date: 04/26/2013    History of Present Illness 79 yo male with recent complications at [facility] after an ablation for afib in oct 2014 he developed a volvulus after the ablation was intubated/trachedm required multiple abd surgeries then developed fistula details unknown. Has been on tpn, with ngt on/off since. Is at [facility] sent here for complaints of abd pain.4/6 rapid reponse for increaesed O2 needs and moved to [facility]. 4/10 G-tube placement Pallative care meeting 04/25/13 at 14:00 with pt and son.    PT Comments    Pt continues to agree to limited mobility and refused OOB even via lift today. Pt encouraged to mobilize and states he wants to get stronger but doesn't want to do anything else today. Pt just switched to trach collar and having some difficulty maintaining sats but reports dizziness as main issue for not wanting to mobilize. Will continue to follow with goals downgraded based on pt lack of significant progression.   Follow Up Recommendations  LTACH     Equipment Recommendations       Recommendations for Other Services       Precautions / Restrictions Precautions Precautions: Fall Precaution Comments: dizziness EOB which persisted even with gaze stabilization and maintained with return to supine, HOH    Mobility  Bed Mobility Overal bed mobility: Needs Assistance   Rolling: Min assist Sidelying to sit: Mod assist;+2 for safety/equipment;+2 for physical assistance   Sit to supine: Mod assist;+2 for safety/equipment;+2 for physical assistance   General bed mobility comments: pt able to roll with aid of rail and cueing for sequence for linen change on arrival. Assist for trunk elevation and clearing legs. Pt sat EOB grossly 4min then insisted on return to supine with assist to elevate legs to surface  Transfers Overall transfer level:  (pt refused attempting to stand or even  lift to chair)                  Ambulation/Gait                 Stairs            Wheelchair Mobility    Modified Rankin (Stroke Patients Only)       Balance Overall balance assessment: Needs assistance Sitting-balance support: Bilateral upper extremity supported;Feet supported Sitting balance-[name] Scale: Poor Sitting balance - Comments: EOB 4 min with min assist for support and max encouragement to maintain upright posture                            Cognition Arousal/Alertness: Awake/alert Behavior During Therapy: WFL for tasks assessed/performed Overall Cognitive Status: Difficult to assess                      Exercises      General Comments        Pertinent Vitals/Pain sats 88-94% on 60% trach collar HR 98-106 BP 126/68 sitting EOB 2/10 chest pain    Home Living                      Prior Function            PT Goals (current goals can now be found in the care plan section) Progress towards PT goals: Not progressing toward goals - comment;Goals downgraded-see care plan    Frequency  PT Plan Current plan remains appropriate    Co-evaluation             End of Session Equipment Utilized During Treatment: Oxygen Activity Tolerance: Patient limited by fatigue Patient left: in bed;with call bell/phone within reach     Time: 0817-0848 PT Time Calculation (min): 31 min  Charges:  $Therapeutic Activity: 23-37 mins                    G Codes:      Tim Short 04/26/2013, 10:58 AM Tim Short, PT [phone]

## 2013-04-26 NOTE — Progress Notes (Signed)
Pt placed back on full support on vent due to labored breathing and feeling like it was too hard to breath.. RT will continue to monitor

## 2013-04-26 NOTE — Progress Notes (Signed)
PULMONARY / CRITICAL CARE MEDICINE   Name: Tim BarerJames B Sorber MRN: [id number] DOB: 01/31/1934    ADMISSION DATE:  03/28/2013 CONSULTATION DATE:  04/20/2013  REFERRING MD :  Dr. Vanessa BarbaraZamora PRIMARY SERVICE:  PCCM  CHIEF COMPLAINT:  AMS / Hypoxia   BRIEF PATIENT DESCRIPTION: 79 y/o with complicated, prolonged illness since 09/2012 with respiratory failure, tracheostomy, dysphagia, cholecystitis s/p cholecystectomy, colon fistula, IVC filter placement, SBO, AF with RVR.  4/8 patient decompensated on medical floor with AMS and hypoxia.  PCCM consulted for evaluation.    SIGNIFICANT EVENTS / STUDIES:  09/2012 - [facility] MAIZE [facility], developed volvulus requiring R colectomy.  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 03/07/13 - Developed abd pain, distention at [facility].  [facility] for gallbladder removal, resection of colon fistula, IVC filter placement 03/2013 - Returned back to [facility] 03/16/13, started on TPN.  Passed swallow eval 3/9 .............................................................................................................................................................................................................................................. 3/16 - Progressive abd pain / distension.  [facility] abdomen with early SBO/Ileus.  [facility] 04/08/13 - SBO resolved.  Surgery s/o.  Developed Afib with RVR.  Cardioverted per Cardiology.  SR 3/28-3/29 with plan for d/c on 3/31 but went back into fib on 3/30 -on heparin gtt.  TPN continued.   4/08 - decompensated & Tx to ICU 4/12 - Weaning on PSV 10/5.  Awake and alert. Still has diarrhea with loose stool and mild diffused abdominal pain.  4/13 - GOC meeting planned with Palliative care; left on ATC 30 hours and  failed  LINES / TUBES: Trach (chronic)>>> LUE PICC 3/22>>>  CULTURES: Sputum 3/22>>> KLEBSIELLA BCx2 3/26>>>neg C-Diff 3/29>>>neg UC 4/2>>>neg C-Diff 4/4 >>> POSITIVE  ANTIBIOTICS: Flagyl 4/4 (Cdiff)>>>  SUBJECTIVE:  Failed ATC on 4/13 (left on 30+ hours); pulm edema on CXR;  VITAL SIGNS: Temp:  [98 F (36.7 C)-99.4 F (37.4 C)] 99.4 F (37.4 C) (04/14 0749) Pulse Rate:  [55-76] 60 (04/14 0749) Resp:  [11-35] 11 (04/14 0749) BP: (88-117)/(52-71) 102/54 mmHg (04/14 0749) SpO2:  [87 %-100 %] 97 % (04/14 0749) FiO2 (%):  [40 %] 40 % (04/14 0749) Weight:  [91.4 kg (201 lb 8 oz)] 91.4 kg (201 lb 8 oz) (04/14 0413)  VENTILATOR SETTINGS: Vent Mode:  [-] PRVC FiO2 (%):  [40 %] 40 % Set Rate:  [14 bmp] 14 bmp Vt Set:  [600 mL] 600 mL PEEP:  [5 cmH20] 5 cmH20 Plateau Pressure:  [21 cmH20-27 cmH20] 21 cmH20  INTAKE / OUTPUT: Intake/Output     04/13 0701 - 04/14 0700 04/14 0701 - 04/15 0700   I.V. (mL/kg) 394 (4.3)    Other 180    NG/GT 1260    IV Piggyback 300    Total Intake(mL/kg) 2134 (23.3)    Urine (mL/kg/hr) 950 (0.4)    Stool     Total Output 950     Net +1184            PHYSICAL EXAMINATION: General:  Resting comfortably on vent HEENT: Trach site c/d/i PULM: rhonchi bilaterally CV: Irreg irreg, normal rate AB: BS+, soft, mildly tender around PEG site Ext: warm, no edema Neuro: Awake, alert, interactive  LABS:  CBC  Recent Labs Lab 04/24/13 0342 04/25/13 0500 04/26/13 0515  WBC 6.6 7.0 6.5  HGB 7.3* 8.0* 7.4*  HCT 22.8* 25.6* 24.0*  PLT 129*  152 142*   Coag's No results found for this basename: APTT, INR,  in the last 168 hours BMET  Recent Labs Lab 04/24/13 0342 04/25/13 0500 04/26/13 0515  NA 141 147 148*  K 2.6* 3.9 3.4*  CL 100 107 112  CO2 26 28 24   BUN 27* 27* 32*  CREATININE 0.77 0.83 0.76  GLUCOSE 114* 133* 149*   Electrolytes  Recent Labs Lab 04/23/13 0430 04/24/13 0342 04/24/13 1815 04/25/13 0500 04/26/13 0515   CALCIUM 8.1* 8.4  --  8.7 8.0*  MG 2.0  --  2.1 2.1  --   PHOS 4.8* 2.6  --  2.9  --    Liver Enzymes  Recent Labs Lab 04/21/13 0300  AST 19  ALT 26  ALKPHOS 66  BILITOT 0.3  ALBUMIN 2.3*   Glucose  Recent Labs Lab 04/25/13 0750 04/25/13 1149 04/25/13 1708 04/25/13 2007 04/26/13 0008 04/26/13 0428  GLUCAP 124* 157* 161* 167* 138* 147*   IMAGING:  Dg Chest Port 1 View  04/25/2013   CLINICAL DATA:  Acute shortness of breath  EXAM: PORTABLE CHEST - 1 VIEW  COMPARISON:  DG CHEST 1V PORT dated 04/22/2013  FINDINGS: Cardiac silhouette is enlarged. A left atrial appendage clip again appreciated stable. A left-sided PICC line is appreciated tip in the region superior vena cava. There is thickening of interstitial markings and areas peribronchial cuffing. Diffuse bilateral pulmonary opacities appreciated with there is a greatest confluence in the lung bases. Blunting of the costophrenic angles. The osseous structures unremarkable.  IMPRESSION: Findings consistent with pulmonary edema. Areas of atelectasis and/or infiltrates in the lung bases with likely component of asymmetric edema.  Small bilateral effusions  Surveillance evaluation recommended.   Electronically Signed   By: Salome HolmesHector  Cooper M.D.   On: 04/25/2013 17:55      ASSESSMENT / PLAN:  PULMONARY A: Acute on chronic respiratory failure - has had mucus plugging, 4/14 pulm edema Chronic Tracheostomy, OSA  Stable L effusion COPD? No acute bronchospasm P:   - ATC during day, vent QHS - Xopenex Q6 PRN - No indication for systemic steroids  CARDIOVASCULAR A:  AF / RVR, HTN, HLD CAD, s/p cath with PCI Pulm edema (preserved LVEF) P:  - Amiodarone, Cardizem, - Increase scheduled metoprolol per peg (three periods of Afib RVR yesterday) -  Lipitor. - PRN Lopressor for HR >110. - cont Xarelto for hx of PE and dvt proph - start spironolactone and chlorthalidone 4/14 (loop diuretic caused ototoxicity)  RENAL A:    Hypokalemia  P:   - Trend BMP daily - Replete electrolytes as indicated  GASTROINTESTINAL A:   Dysphagia GERD C-Diff Pseudomembranous colitis P:   -TF via PEG - swallow eval once on ATC >48 hours - d/c TNA labs  HEMATOLOGIC A:   Chronic anemia P:  - Trend CBC  INFECTIOUS A:   Pseudomembranous colitis P:   - cont Flagyl  Through 4/18  ENDOCRINE A:   DM II P:   - SSI, change to q4h  NEUROLOGIC A:   Acute encephalopathy > resolved P:   - Morphine PRN - Mobilize, PT efforts as able   GLOBAL: -[facility]al [facility] -MD attempted to call [employer] this morning, but could not get through, will try again later today [facility] available 4/13   Heber CarolinaBrent Deshannon Seide, MD [place] [facility]M Pager: [phone] Cell: [phone] If no response, call [phone]   04/26/2013 8:14 AM

## 2013-04-27 ENCOUNTER — Inpatient Hospital Stay (HOSPITAL_COMMUNITY): Payer: Medicare HMO

## 2013-04-27 LAB — BASIC METABOLIC PANEL
BUN: 35 mg/dL — AB (ref 6–23)
CO2: 24 mEq/L (ref 19–32)
Calcium: 8.5 mg/dL (ref 8.4–10.5)
Chloride: 111 mEq/L (ref 96–112)
Creatinine, Ser: 0.84 mg/dL (ref 0.50–1.35)
GFR calc Af Amer: >90 mL/min (ref >90)
GFR, EST NON AFRICAN AMERICAN: 82 mL/min — AB (ref >90)
Glucose, Bld: 174 mg/dL — ABNORMAL HIGH (ref 70–99)
POTASSIUM: 3.6 meq/L — AB (ref 3.7–5.3)
SODIUM: 147 meq/L (ref 137–147)

## 2013-04-27 LAB — GLUCOSE, CAPILLARY
GLUCOSE-CAPILLARY: 150 mg/dL — AB (ref 70–99)
GLUCOSE-CAPILLARY: 149 mg/dL — AB (ref 70–99)
GLUCOSE-CAPILLARY: 137 mg/dL — AB (ref 70–99)
Glucose-Capillary: 108 mg/dL — ABNORMAL HIGH (ref 70–99)
Glucose-Capillary: 128 mg/dL — ABNORMAL HIGH (ref 70–99)
Glucose-Capillary: 147 mg/dL — ABNORMAL HIGH (ref 70–99)

## 2013-04-27 MED ORDER — POTASSIUM CHLORIDE 20 MEQ/15ML (10%) PO LIQD
40.0000 meq | Freq: Once | ORAL | Status: AC
  Administered 2013-04-27: 40 meq
  Filled 2013-04-27: qty 30

## 2013-04-27 MED ORDER — METOLAZONE 5 MG PO TABS
5.0000 mg | ORAL_TABLET | Freq: Every day | ORAL | Status: DC
  Administered 2013-04-27: 5 mg via ORAL
  Filled 2013-04-27 (×3): qty 1

## 2013-04-27 MED ORDER — METRONIDAZOLE 500 MG PO TABS
500.0000 mg | ORAL_TABLET | Freq: Three times a day (TID) | ORAL | Status: DC
  Administered 2013-04-27 – 2013-05-02 (×16): 500 mg via ORAL
  Filled 2013-04-27 (×21): qty 1

## 2013-04-27 MED ORDER — SPIRONOLACTONE 100 MG PO TABS
100.0000 mg | ORAL_TABLET | Freq: Every day | ORAL | Status: DC
  Administered 2013-04-27: 100 mg via ORAL
  Filled 2013-04-27 (×2): qty 1

## 2013-04-27 NOTE — Progress Notes (Signed)
Speech Language Pathology Treatment: Hillary BowPassy Muir Speaking valve  Patient Details Name: Tim BarerJames B Wentzel MRN: [id number] DOB: 12/12/1934 Today's Date: 04/27/2013 Time: [id number] SLP Time Calculation (min): 55 min  Assessment / Plan / Recommendation Clinical Impression  Pt seen for assessment of PMSV tolerance.  Initially, pt refused PMSV placement and po trials, despite encouragement.  Pt willing to allow oral care, and completed it with suction after set up.  White striations noted on surface of tongue.  Following oral care, pt agreed to placement of PMSV.  While brief, pt VSS, and pt was able to verbalize his desire to be placed back on the vent, and for the valve to be removed.  Voice quality was noted to be clear, and stronger (per last SLP documentation). SLP explained rationale for PMSV placement re: improvement of communication and swallow function, however, pt reported pain when PMSV placed.  No overly reddened areas observed in oral cavity/throat, however, this complaint was relayed to RN.  Pt required significant encouragement to participate in therapy, and refused po trials completely.  Cuff was left deflated and RN was notified, and informed of pt request to return to vent support.   HPI HPI: Pt is 79 yo male with fairly [facility] for failed maize procedure and during that hospitalization pt developed volvulus and has required right colectomy (10/08/2012), subsequently developed multiple abscesses and enteric fistulas managed percutaneous drains. Pt was in [facility], unable to wean of the vent and requiring trach placement 10/29/2012. He was transferred to [facility] in December 2014 and later to [facility] in late January 2015. He was on TNA and was doing fairly well initially, but developed more abdominal pain and on 2/23 taken to OR in [facility] for gallbladder removal (secondary to cholecystitis), resection for part of the colon for  fistula, IVC filter placement. Sent back to [facility] on march 4th, 2015 and started on TPN. Diet advanced on March 9th, 2015 after pt passed swallow evaluation. [facility], after noticing progressive abdominal distension. In ED, CT abdomen with air- fluid level and findings consistent with early partial SBO, ileus. Surgery reports pt is ok for PO from their standpoint.  PMSV and BSE completed 3/20, MBS 3/21 and 3/30 - silent aspiration of nectar thick without chin tuck. D2/Nectar with chin tuck recommended.  PEG placed, pt returned to vent intermittently.  SLP followed up today for PMV/po trials.   Pertinent Vitals VSS  SLP Plan  Continue with current plan of care    Recommendations  LTACH      Patient may use Passy-Muir Speech Valve: Intermittently with supervision;Caregiver trained to provide supervision (Pt requires significant encouragement to have PMSV placed.) PMSV Supervision: Intermittent MD: Please consider changing trach tube to :  (if vent requirement can be eliminated)       Oral Care Recommendations: Oral care BID Follow up Recommendations: Skilled Nursing facility;LTACH;24 hour supervision/assistance Plan: Continue with current plan of care    GO    Jaxsun Ciampi B. Murvin NatalBueche, [facility], CCC-SLP [phone]  Gray BernhardtCelia B Gautam Langhorst 04/27/2013, 10:27 AM

## 2013-04-27 NOTE — Progress Notes (Signed)
PULMONARY / CRITICAL CARE MEDICINE   Name: Tim Short MRN: [id number] DOB: March 14, 1934    ADMISSION DATE:  03/28/2013 CONSULTATION DATE:  04/20/2013  REFERRING MD :  Dr. Vanessa Barbara PRIMARY SERVICE:  PCCM  CHIEF COMPLAINT:  AMS / Hypoxia   BRIEF PATIENT DESCRIPTION: 79 y/o with complicated, prolonged illness since 09/2012 with respiratory failure, tracheostomy, dysphagia, cholecystitis s/p cholecystectomy, colon fistula, IVC filter placement, SBO, AF with RVR.  4/8 patient decompensated on medical floor with AMS and hypoxia.  PCCM consulted for evaluation.    SIGNIFICANT EVENTS / STUDIES:  09/2012 - Admit at [facility] for failed MAIZE procedure, developed volvulus requiring R colectomy.  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 03/07/13 - Developed abd pain, distention at [facility].  Taken to OR in [facility] for gallbladder removal, resection of colon fistula, IVC filter placement 03/2013 - Returned back to [facility] 03/16/13, started on TPN.  Passed swallow eval 3/9 .............................................................................................................................................................................................................................................. 3/16 - Progressive abd pain / distension.  Tx to [facility] with CT abdomen with early SBO/Ileus.  Admitted by [facility]. 04/08/13 - SBO resolved.  Surgery s/o.  Developed Afib with RVR.  Cardioverted per Cardiology.  SR 3/28-3/29 with plan for d/c on 3/31 but went back into fib on 3/30 -on heparin gtt.  TPN continued.   4/08 - decompensated & Tx to ICU 4/12 - Weaning on PSV 10/5.  Awake and alert. Still has diarrhea with loose stool and mild diffused abdominal pain.  4/13 - GOC meeting planned with Palliative care; left on ATC 30 hours and  failed 4/14 - ATC 5 hours  LINES / TUBES: Trach (chronic)>>> LUE PICC 3/22>>>  CULTURES: Sputum 3/22>>> KLEBSIELLA BCx2 3/26>>>neg C-Diff 3/29>>>neg UC 4/2>>>neg C-Diff 4/4 >>> POSITIVE  ANTIBIOTICS: Flagyl 4/4 (Cdiff)>>>  SUBJECTIVE:  ATC 5 hours yesterday, complaining of abdominal pain this morning; didn't want to participate in PT yesterday  VITAL SIGNS: Temp:  [98 F (36.7 C)-98.9 F (37.2 C)] 98 F (36.7 C) (04/15 0420) Pulse Rate:  [51-110] 60 (04/15 0400) Resp:  [12-25] 25 (04/15 0400) BP: (81-111)/(51-74) 89/51 mmHg (04/15 0400) SpO2:  [92 %-100 %] 98 % (04/15 0400) FiO2 (%):  [40 %-60 %] 40 % (04/15 0400) Weight:  [91 kg (200 lb 9.9 oz)] 91 kg (200 lb 9.9 oz) (04/15 0420)  VENTILATOR SETTINGS: Vent Mode:  [-] PRVC FiO2 (%):  [40 %-60 %] 40 % Set Rate:  [14 bmp] 14 bmp Vt Set:  [600 mL] 600 mL PEEP:  [5 cmH20] 5 cmH20 Plateau Pressure:  [19 cmH20-25 cmH20] 19 cmH20  INTAKE / OUTPUT: Intake/Output     04/14 0701 - 04/15 0700 04/15 0701 - 04/16 0700   I.V. (mL/kg) 10 (0.1)    Other     NG/GT 720    IV Piggyback 100    Total Intake(mL/kg) 830 (9.1)    Urine (mL/kg/hr)     Total Output       Net +830          Urine Occurrence 6 x      PHYSICAL EXAMINATION: General:  Resting comfortably on vent HEENT: Trach site c/d/i PULM: few crackles in bases CV: Irreg irreg, normal rate AB: BS+, soft, soft, no clear tenderness, PEG site normal Ext: warm, notable dependent edema Neuro: Awake, alert, interactive  LABS:  CBC  Recent Labs Lab 04/24/13 0342 04/25/13 0500 04/26/13  0515  WBC 6.6 7.0 6.5  HGB 7.3* 8.0* 7.4*  HCT 22.8* 25.6* 24.0*  PLT 129* 152 142*   Coag's No results found for this basename: APTT, INR,  in the last 168 hours BMET  Recent Labs Lab 04/25/13 0500 04/26/13 0515 04/27/13 0530  NA 147 148* 147  K 3.9 3.4* 3.6*  CL 107 112 111  CO2 28 24 24   BUN 27* 32* 35*  CREATININE 0.83 0.76 0.84  GLUCOSE 133* 149* 174*    Electrolytes  Recent Labs Lab 04/23/13 0430 04/24/13 0342 04/24/13 1815 04/25/13 0500 04/26/13 0515 04/27/13 0530  CALCIUM 8.1* 8.4  --  8.7 8.0* 8.5  MG 2.0  --  2.1 2.1  --   --   PHOS 4.8* 2.6  --  2.9  --   --    Liver Enzymes  Recent Labs Lab 04/21/13 0300  AST 19  ALT 26  ALKPHOS 66  BILITOT 0.3  ALBUMIN 2.3*   Glucose  Recent Labs Lab 04/26/13 0752 04/26/13 1152 04/26/13 1709 04/26/13 2015 04/27/13 0004 04/27/13 0417  GLUCAP 141* 186* 148* 127* 128* 147*   IMAGING:  Dg Chest Port 1 View  04/25/2013   CLINICAL DATA:  Acute shortness of breath  EXAM: PORTABLE CHEST - 1 VIEW  COMPARISON:  DG CHEST 1V PORT dated 04/22/2013  FINDINGS: Cardiac silhouette is enlarged. A left atrial appendage clip again appreciated stable. A left-sided PICC line is appreciated tip in the region superior vena cava. There is thickening of interstitial markings and areas peribronchial cuffing. Diffuse bilateral pulmonary opacities appreciated with there is a greatest confluence in the lung bases. Blunting of the costophrenic angles. The osseous structures unremarkable.  IMPRESSION: Findings consistent with pulmonary edema. Areas of atelectasis and/or infiltrates in the lung bases with likely component of asymmetric edema.  Small bilateral effusions  Surveillance evaluation recommended.   Electronically Signed   By: Salome HolmesHector  Cooper M.D.   On: 04/25/2013 17:55      ASSESSMENT / PLAN:  PULMONARY A: Acute on chronic respiratory failure - limited by weakness and pulm edema Chronic Tracheostomy, OSA  Stable L effusion COPD? No acute bronchospasm P:   - ATC during day as tolerated, vent QHS - Xopenex Q6 PRN  CARDIOVASCULAR A:  AF / RVR rate controlled HTN HLD CAD, s/p cath with PCI Pulm edema (preserved LVEF) > furosemide caused ototoxicity P:  - Continue Amiodarone, Cardizem, - Continue metoprolol per peg - Continue Lipitor. - PRN Lopressor for HR >110. - cont Xarelto for  hx of PE and dvt proph - continue spironolactone daily - trial metolazone 4/15, may try MWF if responds - no loop diuretics  RENAL A:   Hypokalemia  P:   - Trend BMP daily - Replete electrolytes as indicated  GASTROINTESTINAL A:   Dysphagia GERD C-Diff Pseudomembranous colitis Abdominal pain> normal belly exam, related to tube feeds? P:   - KUB 4/15 to eval for megacolon (unlikely) vs ileus - hold tube feeds for 3-4 hours 4/15 AM, then restart - check LFT's - if abdominal pain continues, may need to consider different formula of TF - swallow eval once on ATC >48 hours  HEMATOLOGIC A:   Chronic anemia P:  - Trend CBC  INFECTIOUS A:   Pseudomembranous colitis P:   - cont Flagyl  Through 4/18 (convert to per tube 4/15)  ENDOCRINE A:   DM II P:   - SSI,  q4h  NEUROLOGIC A:   Acute encephalopathy > resolved  P:   - Morphine PRN - Mobilize, PT efforts as able   GLOBAL: -[facility] -[facility] 4/15 AM -[facility]   Heber CarolinaBrent McQuaid, MD [place] PCCM Pager: [phone] Cell: [phone] If no response, call [phone]   04/27/2013 7:55 AM

## 2013-04-27 NOTE — Progress Notes (Signed)
Trach CK done. Pt in no distress at this time. Pt sats 95% on 40% TC.

## 2013-04-28 ENCOUNTER — Inpatient Hospital Stay (HOSPITAL_COMMUNITY): Payer: Medicare HMO

## 2013-04-28 DIAGNOSIS — R609 Edema, unspecified: Secondary | ICD-10-CM

## 2013-04-28 LAB — GLUCOSE, CAPILLARY
GLUCOSE-CAPILLARY: 158 mg/dL — AB (ref 70–99)
GLUCOSE-CAPILLARY: 162 mg/dL — AB (ref 70–99)
Glucose-Capillary: 169 mg/dL — ABNORMAL HIGH (ref 70–99)
Glucose-Capillary: 150 mg/dL — ABNORMAL HIGH (ref 70–99)
Glucose-Capillary: 118 mg/dL — ABNORMAL HIGH (ref 70–99)
Glucose-Capillary: 132 mg/dL — ABNORMAL HIGH (ref 70–99)

## 2013-04-28 LAB — CBC WITH DIFFERENTIAL/PLATELET
BASOS PCT: 0 % (ref 0–1)
Basophils Absolute: 0 10*3/uL (ref 0.0–0.1)
EOS ABS: 0.1 10*3/uL (ref 0.0–0.7)
Eosinophils Relative: 1 % (ref 0–5)
HEMATOCRIT: 27.1 % — AB (ref 39.0–52.0)
Hemoglobin: 8 g/dL — ABNORMAL LOW (ref 13.0–17.0)
LYMPHS PCT: 22 % (ref 12–46)
Lymphs Abs: 1.7 10*3/uL (ref 0.7–4.0)
MCH: 28.8 pg (ref 26.0–34.0)
MCHC: 29.5 g/dL — ABNORMAL LOW (ref 30.0–36.0)
MCV: 97.5 fL (ref 78.0–100.0)
Monocytes Absolute: 0.6 10*3/uL (ref 0.1–1.0)
Monocytes Relative: 7 % (ref 3–12)
Neutro Abs: 5.3 10*3/uL (ref 1.7–7.7)
Neutrophils Relative %: 70 % (ref 43–77)
Platelets: 179 10*3/uL (ref 150–400)
RBC: 2.78 MIL/uL — ABNORMAL LOW (ref 4.22–5.81)
RDW: 19.5 % — ABNORMAL HIGH (ref 11.5–15.5)
WBC: 7.7 10*3/uL (ref 4.0–10.5)

## 2013-04-28 LAB — BASIC METABOLIC PANEL
BUN: 26 mg/dL — ABNORMAL HIGH (ref 6–23)
CHLORIDE: 111 meq/L (ref 96–112)
CO2: 25 meq/L (ref 19–32)
CREATININE: 0.84 mg/dL (ref 0.50–1.35)
Calcium: 8.6 mg/dL (ref 8.4–10.5)
GFR calc Af Amer: >90 mL/min (ref >90)
GFR calc non Af Amer: 82 mL/min — ABNORMAL LOW (ref >90)
Glucose, Bld: 198 mg/dL — ABNORMAL HIGH (ref 70–99)
Potassium: 3.5 mEq/L — ABNORMAL LOW (ref 3.7–5.3)
Sodium: 146 mEq/L (ref 137–147)

## 2013-04-28 MED ORDER — FUROSEMIDE 10 MG/ML IJ SOLN
60.0000 mg | Freq: Four times a day (QID) | INTRAMUSCULAR | Status: AC
  Administered 2013-04-28 (×2): 60 mg via INTRAVENOUS
  Filled 2013-04-28 (×2): qty 6

## 2013-04-28 MED ORDER — POTASSIUM CHLORIDE 20 MEQ/15ML (10%) PO LIQD
40.0000 meq | Freq: Three times a day (TID) | ORAL | Status: AC
  Administered 2013-04-28 – 2013-04-29 (×3): 40 meq
  Filled 2013-04-28 (×3): qty 30

## 2013-04-28 MED ORDER — SIMETHICONE 40 MG/0.6ML PO SUSP
40.0000 mg | Freq: Four times a day (QID) | ORAL | Status: DC | PRN
  Administered 2013-04-28: 40 mg via ORAL
  Filled 2013-04-28: qty 0.6

## 2013-04-28 NOTE — Progress Notes (Signed)
PULMONARY / CRITICAL CARE MEDICINE   Name: Tim Short MRN: [id number] DOB: April 02, 1934    ADMISSION DATE:  03/28/2013 CONSULTATION DATE:  04/20/2013  REFERRING MD :  Dr. Vanessa Barbara PRIMARY SERVICE:  PCCM  CHIEF COMPLAINT:  AMS / Hypoxia   BRIEF PATIENT DESCRIPTION: 79 y/o with complicated, prolonged illness since 09/2012 with respiratory failure, tracheostomy, dysphagia, cholecystitis s/p cholecystectomy, colon fistula, IVC filter placement, SBO, AF with RVR.  4/8 patient decompensated on medical floor with AMS and hypoxia.  PCCM consulted for evaluation.    SIGNIFICANT EVENTS / STUDIES:  09/2012 - Admit at [facility] for failed MAIZE procedure, developed volvulus requiring R colectomy.  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 03/07/13 - Developed abd pain, distention at [facility].  Taken to OR in [facility] for gallbladder removal, resection of colon fistula, IVC filter placement 03/2013 - Returned back to [facility] 03/16/13, started on TPN.  Passed swallow eval 3/9 .............................................................................................................................................................................................................................................. 3/16 - Progressive abd pain / distension.  Tx to [facility] with CT abdomen with early SBO/Ileus.  Admitted by [facility]. 04/08/13 - SBO resolved.  Surgery s/o.  Developed Afib with RVR.  Cardioverted per Cardiology.  SR 3/28-3/29 with plan for d/c on 3/31 but went back into fib on 3/30 -on heparin gtt.  TPN continued.   4/08 - decompensated & Tx to ICU 4/12 - Weaning on PSV 10/5.  Awake and alert. Still has diarrhea with loose stool and mild diffused abdominal pain.  4/13 - GOC meeting planned with Palliative care; left on ATC 30 hours and  failed 4/14 - ATC 5 hours 4/15 -   LINES / TUBES: Trach (chronic)>>> LUE PICC 3/22>>>  CULTURES: Sputum 3/22>>> KLEBSIELLA BCx2 3/26>>>neg C-Diff 3/29>>>neg UC 4/2>>>neg C-Diff 4/4 >>> POSITIVE  ANTIBIOTICS: Flagyl 4/4 (Cdiff)>>>  SUBJECTIVE:  ATC all day, still has abdominal cramping, feels like gas; not participating with PT  VITAL SIGNS: Temp:  [98 F (36.7 C)-98.4 F (36.9 C)] 98 F (36.7 C) (04/16 0336) Pulse Rate:  [41-112] 89 (04/16 0821) Resp:  [16-30] 22 (04/16 0821) BP: (83-113)/(56-73) 96/67 mmHg (04/16 0821) SpO2:  [95 %-100 %] 97 % (04/16 0821) FiO2 (%):  [40 %] 40 % (04/16 0821) Weight:  [91.2 kg (201 lb 1 oz)] 91.2 kg (201 lb 1 oz) (04/16 0336)  VENTILATOR SETTINGS: Vent Mode:  [-] PRVC FiO2 (%):  [40 %] 40 % Set Rate:  [14 bmp] 14 bmp Vt Set:  [600 mL] 600 mL PEEP:  [5 cmH20] 5 cmH20 Plateau Pressure:  [20 cmH20-22 cmH20] 20 cmH20  INTAKE / OUTPUT: Intake/Output     04/15 0701 - 04/16 0700 04/16 0701 - 04/17 0700   I.V. (mL/kg) 250 (2.7)    Other 250    NG/GT 1200    IV Piggyback     Total Intake(mL/kg) 1700 (18.6)    Urine (mL/kg/hr) 700 (0.3)    Stool 600 (0.3)    Total Output 1300     Net +400            PHYSICAL EXAMINATION: General:  Resting comfortably on vent HEENT: Trach site c/d/i PULM: few crackles in bases CV: Irreg irreg, normal rate AB: BS+, soft, soft, very mild diffuse tenderness, PEG site normal Ext: warm, notable dependent edema Neuro: Awake, alert, interactive  LABS:  CBC  Recent Labs Lab 04/25/13 0500 04/26/13 0515 04/28/13 0515  WBC 7.0 6.5 7.7  HGB 8.0* 7.4* 8.0*  HCT 25.6* 24.0* 27.1*  PLT 152 142* 179   Coag's No results found for this basename: APTT, INR,  in the last 168 hours BMET  Recent Labs Lab 04/26/13 0515 04/27/13 0530 04/28/13 0515  NA 148* 147 146  K 3.4* 3.6* 3.5*  CL 112 111 111  CO2 24 24 25   BUN 32* 35* 26*  CREATININE 0.76 0.84 0.84  GLUCOSE 149* 174* 198*    Electrolytes  Recent Labs Lab 04/23/13 0430 04/24/13 0342 04/24/13 1815 04/25/13 0500 04/26/13 0515 04/27/13 0530 04/28/13 0515  CALCIUM 8.1* 8.4  --  8.7 8.0* 8.5 8.6  MG 2.0  --  2.1 2.1  --   --   --   PHOS 4.8* 2.6  --  2.9  --   --   --    Liver Enzymes No results found for this basename: AST, ALT, ALKPHOS, BILITOT, ALBUMIN,  in the last 168 hours Glucose  Recent Labs Lab 04/27/13 1128 04/27/13 1559 04/27/13 2030 04/27/13 2348 04/28/13 0417 04/28/13 0729  GLUCAP 108* 149* 137* 158* 169* 150*   IMAGING:  Dg Chest Port 1 View  04/28/2013   CLINICAL DATA:  Respiratory failure  EXAM: PORTABLE CHEST - 1 VIEW  COMPARISON:  DG CHEST 1V PORT dated 04/25/2013  FINDINGS: Normal cardiac silhouette. Atrial appendage clip noted. Tracheostomy tube unchanged. Bilateral pleural effusions not changed. Mild central venous congestion is unchanged. No pneumothorax. Left PICC line noted.  IMPRESSION: 1. No interval change. 2. Bibasilar effusions in central venous congestion.   Electronically Signed   By: Genevive BiStewart  Edmunds M.D.   On: 04/28/2013 08:04   Dg Abd Portable 1v  04/27/2013   CLINICAL DATA:  Abdominal pain  EXAM: PORTABLE ABDOMEN - 1 VIEW  COMPARISON:  April 21, 2013  FINDINGS: Gastrostomy is position in the region of the stomach. There is dilated transverse colon in the left mid abdomen. The bowel loops appear unremarkable. No free air is seen on this supine examination. There are multiple clips in the upper abdomen. There is a filter in the inferior vena cava.  IMPRESSION: Question a degree of colonic ileus. No focal obstruction seen. No free air. Gastrostomy present. Postoperative change upper abdomen.   Electronically Signed   By: Bretta BangWilliam  Woodruff M.D.   On: 04/27/2013 08:35      ASSESSMENT / PLAN:  PULMONARY A: Acute on chronic respiratory failure - no clear lung problem; limited by weakness and pulm edema Chronic Tracheostomy, OSA  Stable L effusion COPD? No acute  bronchospasm P:   - ATC during day as tolerated, vent QHS - dispo LTAC ideal - Xopenex Q6 PRN  CARDIOVASCULAR A:  AF / RVR rate controlled HTN HLD CAD, s/p cath with PCI Pulm edema (preserved LVEF) > clarified with son, furosemide did NOT cause ototoxicity; IV furosemide OK per son P:  - Continue Amiodarone, Cardizem, - Continue metoprolol per peg - Continue Lipitor. - PRN Lopressor for HR >110. - cont Xarelto for hx of PE and dvt proph - furosemide IV x2 doses today  RENAL A:   Hypokalemia  P:   - Trend BMP daily - Replete electrolytes as indicated  GASTROINTESTINAL A:   Dysphagia GERD C-Diff Pseudomembranous colitis Chronic Abdominal pain> normal belly exam, related to tube feeds? P:   - simethicone - ask dietary to reassess tube feeds> contributing to cramping? - check LFT's - swallow eval once on ATC >48 hours  HEMATOLOGIC A:  Chronic anemia P:  - Trend CBC  INFECTIOUS A:   Pseudomembranous colitis P:   - cont Flagyl  Through 4/18 (convert to per tube 4/15)  ENDOCRINE A:   DM II P:   - SSI,  q4h  NEUROLOGIC A:   Acute encephalopathy > resolved P:   - Morphine PRN - Mobilize, PT efforts as able > educated him that he needs to participate with PT more   GLOBAL: -[facility] -[employer] reconsidering LTAC benefit post MD conversation 4/15 -need to work on motivation for PT, he is not giving up, wants to get better, but says he "is afraid" of getting up to a chair [facility] does have bed available as of 4/13   Heber CarolinaBrent Lenore Moyano, MD [facility] Pager: [phone] Cell: [phone] If no response, call [phone]   04/28/2013 8:35 AM

## 2013-04-28 NOTE — Progress Notes (Signed)
MD provided phone number to insurance company to complete peer-to-peer; goal remains LTACH. CSW following, and RNCM involved in care plan.   Tim LabradorJulie Shelia Magallon, MSW, [facility] Center[facility]WA Clinical Social Worker [phone]

## 2013-04-28 NOTE — Progress Notes (Addendum)
[facility]  Subjective:    -f/u visit requested by PT,  they suggest further discussion to re-visit his GOC and wishes -patient is resting, easily arousable, and when asked how he is doing he give a "thumbs down"  - several yes and no questions were asked of the patient and he answered with head nods  --are you comfortable?  NO --do you need medications for pain or trouble breathing--NO --do you wish to continue the present medical interventions?  NO --do you realize how sick you are--YES --do you realize without these medical interventions you will likely die in a short period of time? YES --have you discussed this with your son?--YES  I assured patient I would make this conversation known to his son   I placed a call to Onalee HuaDavid (son) and described the above interaction.  He plans to have a conversation with his father today in an attempt to clarify his wishes.  He worries that his father can be "swayed" easily.    Objective: Allergies  Allergen Reactions  . Corticosteroids     Inhaled Corticosteroids--Hoarseness, dry mouth  . Furosemide Other (See Comments)    Discussed with son 4/15, mild reaction (rash?) with oral formulation only    Scheduled Meds: . amiodarone  200 mg Oral BID  . antiseptic oral rinse  15 mL Mouth Rinse QID  . atorvastatin  40 mg Oral Daily  . chlorhexidine  15 mL Mouth Rinse BID  . diltiazem  120 mg Oral TID WC & HS  . furosemide  60 mg Intravenous Q6H  . hydrocortisone cream   Topical BID  . insulin aspart  0-15 Units Subcutaneous 6 times per day  . metoprolol tartrate  25 mg Oral BID  . metroNIDAZOLE  500 mg Oral 3 times per day  . potassium chloride  40 mEq Per Tube 3 times per day  . rivaroxaban  20 mg Oral Q supper  . sodium chloride  10-40 mL Intracatheter Q12H   Continuous Infusions: . sodium chloride 20 mL/hr (04/25/13 2136)  . feeding supplement (VITAL 1.5 CAL) 1,000 mL (04/28/13 0103)    PRN Meds:.acetaminophen (TYLENOL) oral liquid 160 mg/5 mL, alum & mag hydroxide-simeth, levalbuterol, metoprolol, midazolam, morphine injection, naphazoline, nitroGLYCERIN, ondansetron (ZOFRAN) IV, simethicone, sodium chloride, vitamin A & D  BP 108/82  Pulse 86  Temp(Src) 98 F (36.7 C) (Oral)  Resp 29  Ht 5' 8.9" (1.75 m)  Wt 91.2 kg (201 lb 1 oz)  BMI 29.78 kg/m2  SpO2 99%   PPS:30 % at best  Pain Score: mouths "Uncomfortable"    Intake/Output Summary (Last 24 hours) at 04/28/13 1418 Last data filed at 04/28/13 1226  Gross per 24 hour  Intake   1280 ml  Output   2300 ml  Net  -1020 ml       Physical Exam:  General: resting, NAD HEENT:  Mm, no exudate/trach site noted clean and intact Chest:   Decreased in bases CVS: irregular Abdomen:  PEG site unremarkable, + BS Neuro: oriented to person and place  Labs: CBC    Component Value Date/Time   WBC 7.7 04/28/2013 0515   RBC 2.78* 04/28/2013 0515   HGB 8.0* 04/28/2013 0515   HCT 27.1* 04/28/2013 0515   PLT 179 04/28/2013 0515   MCV 97.5 04/28/2013 0515   MCH 28.8 04/28/2013 0515   MCHC 29.5* 04/28/2013 0515   RDW 19.5* 04/28/2013 0515   LYMPHSABS 1.7 04/28/2013  0515   MONOABS 0.6 04/28/2013 0515   EOSABS 0.1 04/28/2013 0515   BASOSABS 0.0 04/28/2013 0515    BMET    Component Value Date/Time   NA 146 04/28/2013 0515   K 3.5* 04/28/2013 0515   CL 111 04/28/2013 0515   CO2 25 04/28/2013 0515   GLUCOSE 198* 04/28/2013 0515   BUN 26* 04/28/2013 0515   CREATININE 0.84 04/28/2013 0515   CALCIUM 8.6 04/28/2013 0515   GFRNONAA 82* 04/28/2013 0515   GFRAA >90 04/28/2013 0515    CMP     Component Value Date/Time   NA 146 04/28/2013 0515   K 3.5* 04/28/2013 0515   CL 111 04/28/2013 0515   CO2 25 04/28/2013 0515   GLUCOSE 198* 04/28/2013 0515   BUN 26* 04/28/2013 0515   CREATININE 0.84 04/28/2013 0515   CALCIUM 8.6 04/28/2013 0515   PROT 5.1* 04/21/2013 0300   ALBUMIN 2.3* 04/21/2013 0300   AST 19 04/21/2013 0300   ALT 26 04/21/2013 0300    ALKPHOS 66 04/21/2013 0300   BILITOT 0.3 04/21/2013 0300   GFRNONAA 82* 04/28/2013 0515   GFRAA >90 04/28/2013 0515     Patient Documents Completed or Given: Document Given Completed  Advanced Directives Pkt    MOST yes   DNR    Gone from My Sight    Hard Choices yes     Time In Time Out Total Time Spent with Patient Total Overall Time  1015 1035 15 min 20 min    Greater than 50%  of this time was spent counseling and coordinating care related to the above assessment and plan.  Lorinda CreedMary Larach NP  Palliative Medicine Team Team Phone # [phone] Pager [phone]  Discussed with Dr Kendrick FriesMcQuaid  Addendum --- this note was previously dated incorrectly, date of service was 04-29-13  Lorinda CreedMary Larach NP 1

## 2013-04-28 NOTE — Progress Notes (Addendum)
NUTRITION CONSULT/FOLLOW UP  INTERVENTION:  D/C Vital 1.5 formula Initiate Osmolite 1.5 formula at 15 ml/hr and increase by 10 ml every 4 hours to goal rate of 55 ml/hr with Prostat liquid protein BID to provide 2180 kcals, 113 gm protein, 1006 ml of water  RD to follow for nutrition care plan  NUTRITION DIAGNOSIS: Inadequate oral intake now inability to eat as evidenced by NPO status, ongoing  Goal: Pt to meet >/= 90% of their estimated nutrition needs, met  Monitor:  TF regimen & tolerance, respiratory status, weight, labs, I/O's  ASSESSMENT: 79 yo male with recent complications at [facility] after an ablation for afib in Oct 2014 he developed a volvulus after the ablation was intubated/trached; required multiple abd surgeries then developed fistula.   Has been on TPN, with NGT on/off since. Is at [facility] and sent to [facility] for complaints of abd pain. Pt was taken off TPN and NGT was removed last week. He has been eating by mouth and doing well. Last 4 days with worsening abd pain, no N/V/D.  Patient s/p procedure 4/10: IR GASTROSTOMY TUBE  Patient transferred to 2C-Stepdown from [facility] 4/12.  Patient continues on trach collar.  TPN discontinued 4/13.  Vital 1.5 currently infusing at 60 mL/hr via G-tube providing 2160 kcal, 97g protein, 1100 mL free water.    RD consulted re: abdominal cramping, ? change formula.  Patient receiving semi-elemental formula with hydrolyzed protein and prebiotic.  Patient has been experiencing mild abdominal pain prior to EN initiation.  RD does not feel formula is contributing to cramping and/or pain.  Addendum:  RD spoke with Dr. Lake Bells 4/17, 0911 AM.  Plan is to trial different formula.  RD to change to Osmolite 1.5.  Height: Ht Readings from Last 1 Encounters:  03/30/13 5' 8.9" (1.75 m)    Weight -----> stable Wt Readings from Last 1 Encounters:  04/28/13 201 lb 1 oz (91.2 kg)    4/15  200 lb 4/14  201 lb 4/12  202 lb 4/11  200  lb 4/10  202 lb 4/09  208 lb 4/08  210 lb 4/07  204 lb 4/06  203 lb 4/05  199 lb  BMI:  Body mass index is 29.78 kg/(m^2).   Re-estimated needs: Calories: 2020-2300 Protein: 100-120 gm Fluid: 2.0-2.2 L  Skin:  stage II pressure ulcer to L + R buttocks Closed R abdominal incision Open mid-lower abdominal incision  Diet Order: NPO   Intake/Output Summary (Last 24 hours) at 04/28/13 1134 Last data filed at 04/28/13 0900  Gross per 24 hour  Intake   1490 ml  Output   1300 ml  Net    190 ml    Labs:   Recent Labs Lab 04/23/13 0430 04/24/13 0342 04/24/13 1815 04/25/13 0500 04/26/13 0515 04/27/13 0530 04/28/13 0515  NA 136* 141  --  147 148* 147 146  K 4.0 2.6*  --  3.9 3.4* 3.6* 3.5*  CL 97 100  --  107 112 111 111  CO2 25 26  --  28 24 24 25   BUN 28* 27*  --  27* 32* 35* 26*  CREATININE 0.80 0.77  --  0.83 0.76 0.84 0.84  CALCIUM 8.1* 8.4  --  8.7 8.0* 8.5 8.6  MG 2.0  --  2.1 2.1  --   --   --   PHOS 4.8* 2.6  --  2.9  --   --   --   GLUCOSE 717* 114*  --  133* 149* 174* 198*    CBG (last 3)   Recent Labs  04/27/13 2348 04/28/13 0417 04/28/13 0729  GLUCAP 158* 169* 150*   Prealbumin  Date/Time Value Ref Range Status  04/25/2013  5:00 AM 18.5  17.0 - 34.0 mg/dL Final     Performed at [facility]   Triglycerides  Date/Time Value Ref Range Status  04/25/2013  5:00 AM 90  <150 mg/dL Final    Scheduled Meds: . amiodarone  200 mg Oral BID  . antiseptic oral rinse  15 mL Mouth Rinse QID  . atorvastatin  40 mg Oral Daily  . chlorhexidine  15 mL Mouth Rinse BID  . diltiazem  120 mg Oral TID WC & HS  . furosemide  60 mg Intravenous Q6H  . hydrocortisone cream   Topical BID  . insulin aspart  0-15 Units Subcutaneous 6 times per day  . metoprolol tartrate  25 mg Oral BID  . metroNIDAZOLE  500 mg Oral 3 times per day  . potassium chloride  40 mEq Per Tube 3 times per day  . rivaroxaban  20 mg Oral Q supper  . sodium chloride  10-40 mL  Intracatheter Q12H    Continuous Infusions: . sodium chloride 20 mL/hr (04/25/13 2136)  . feeding supplement (VITAL 1.5 CAL) 1,000 mL (04/28/13 0103)    Tim Short, RD, LDN Pager #: [phone] After-Hours Pager #: [phone]

## 2013-04-29 LAB — GLUCOSE, CAPILLARY
GLUCOSE-CAPILLARY: 150 mg/dL — AB (ref 70–99)
Glucose-Capillary: 126 mg/dL — ABNORMAL HIGH (ref 70–99)
Glucose-Capillary: 136 mg/dL — ABNORMAL HIGH (ref 70–99)
Glucose-Capillary: 156 mg/dL — ABNORMAL HIGH (ref 70–99)
Glucose-Capillary: 167 mg/dL — ABNORMAL HIGH (ref 70–99)
Glucose-Capillary: 170 mg/dL — ABNORMAL HIGH (ref 70–99)

## 2013-04-29 LAB — BASIC METABOLIC PANEL
BUN: 25 mg/dL — ABNORMAL HIGH (ref 6–23)
CO2: 29 meq/L (ref 19–32)
Calcium: 8.6 mg/dL (ref 8.4–10.5)
Chloride: 104 mEq/L (ref 96–112)
Creatinine, Ser: 0.89 mg/dL (ref 0.50–1.35)
GFR calc Af Amer: >90 mL/min (ref >90)
GFR calc non Af Amer: 80 mL/min — ABNORMAL LOW (ref >90)
Glucose, Bld: 182 mg/dL — ABNORMAL HIGH (ref 70–99)
Potassium: 3.9 mEq/L (ref 3.7–5.3)
SODIUM: 144 meq/L (ref 137–147)

## 2013-04-29 LAB — MRSA PCR SCREENING: MRSA by PCR: NEGATIVE

## 2013-04-29 MED ORDER — PRO-STAT SUGAR FREE PO LIQD
30.0000 mL | Freq: Two times a day (BID) | ORAL | Status: DC
  Administered 2013-04-29 – 2013-05-04 (×11): 30 mL
  Filled 2013-04-29 (×12): qty 30

## 2013-04-29 MED ORDER — OSMOLITE 1.5 CAL PO LIQD
1000.0000 mL | ORAL | Status: DC
  Administered 2013-04-29 – 2013-05-02 (×4): 1000 mL
  Filled 2013-04-29 (×9): qty 1000

## 2013-04-29 MED ORDER — POTASSIUM CHLORIDE 20 MEQ/15ML (10%) PO LIQD
40.0000 meq | Freq: Three times a day (TID) | ORAL | Status: AC
  Administered 2013-04-29 – 2013-04-30 (×3): 40 meq
  Filled 2013-04-29 (×3): qty 30

## 2013-04-29 MED ORDER — FUROSEMIDE 10 MG/ML IJ SOLN
60.0000 mg | Freq: Four times a day (QID) | INTRAMUSCULAR | Status: AC
  Administered 2013-04-29 (×2): 60 mg via INTRAVENOUS

## 2013-04-29 NOTE — Progress Notes (Signed)
Speech Language Pathology Treatment: Nada Boozer Speaking valve  Patient Details Name: Tim Short MRN: [id number] DOB: December 15, 1934 Today's Date: 04/29/2013 Time: 5625-6389 SLP Time Calculation (min): 35 min  Assessment / Plan / Recommendation Clinical Impression  Cuff was deflated by SLP prior to donning PMSV.  RN present during beginning of trial.  Pt continues to require significant encouragement to allow placement of speaking valve.  Rationale for use were explained to pt, including ability to communicate verbally with staff to have needs met more quickly, improved overall strength and endurance, improved swallow function for goal of resuming po intake.  Pt indicated that he is "tired" and "ready to give up".  Pt reported pain when speaking with valve in place.  SLP encouraged pt to allow valve to be placed, but for him to refrain from speaking - just breathe.  Pt reported no pain with just breathing - only when speaking.  Pt tolerated placement for less than 5 minutes, then requested it be removed so he "can rest". Valve removed per pt request, although I feel he could tolerate having it on during waking hours (removing during breathing treatments).  This would be so beneficial for pt recovery. Cuff left deflated. RN notified.   HPI HPI: Pt is 80 yo male with fairly recent hospitalization at [facility] in September 2014 for failed maize procedure and during that hospitalization pt developed volvulus and has required right colectomy (10/08/2012), subsequently developed multiple abscesses and enteric fistulas managed percutaneous drains. Pt was in prolonged respiratory failure at [facility], unable to wean of the vent and requiring trach placement 10/29/2012. He was transferred to [facility] in December 2014 and later to [facility] in late January 2015. He was on TNA and was doing fairly well initially, but developed more abdominal pain and on 2/23 taken to OR in [place] for gallbladder removal (secondary to cholecystitis),  resection for part of the colon for fistula, IVC filter placement. Sent back to [facility] on march 4th, 2015 and started on TPN. Diet advanced on March 9th, 2015 after pt passed swallow evaluation. He was brought to [facility] March 16th, after noticing progressive abdominal distension. In ED, CT abdomen with air- fluid level and findings consistent with early partial SBO, ileus. Surgery reports pt is ok for PO from their standpoint.  PMSV and BSE completed 3/20, MBS 3/21 and 3/30 - silent aspiration of nectar thick without chin tuck. D2/Nectar with chin tuck recommended.  PEG placed, pt returned to vent intermittently.   SLP following for PMSV tx/ po trials   Pertinent Vitals VSS  SLP Plan  Continue with current plan of care    Recommendations  more family involvement if that would facilitate pt's motivation to improve/work with therapies.      Patient may use Passy-Muir Speech Valve: During all waking hours (remove during sleep) PMSV Supervision: Full MD: Please consider changing trach tube to : Smaller size;Cuffless       Oral Care Recommendations: Oral care BID Follow up Recommendations: Skilled Nursing facility;LTACH;24 hour supervision/assistance Plan: Continue with current plan of care    [place] [facility], [place] [phone] [place] 04/29/2013, 10:23 AM

## 2013-04-29 NOTE — Progress Notes (Signed)
Stopped by Staff on 2c.  Per staff patient making statements that he wants Palliative Care to return to discuss his current desires as he does not want to continue with current efforts.  Palliative Care contacted per PT at patients request.     Canary BrimBrandi Collier Monica, NP-C [facility] Pgr: [phone] or [phone]

## 2013-04-29 NOTE — Progress Notes (Signed)
PULMONARY / CRITICAL CARE MEDICINE   Name: Tim Short MRN: [id number] DOB: [birth year]    ADMISSION DATE:  03/28/2013 CONSULTATION DATE:  04/20/2013  REFERRING MD :  Dr. Vanessa Barbara PRIMARY SERVICE:  PCCM  CHIEF COMPLAINT:  AMS / Hypoxia   BRIEF PATIENT DESCRIPTION: 79 y/o with complicated, prolonged illness since 09/2012 with respiratory failure, tracheostomy, dysphagia, cholecystitis s/p cholecystectomy, colon fistula, IVC filter placement, SBO, AF with RVR.  4/8 patient decompensated on medical floor with AMS and hypoxia.  PCCM consulted for evaluation.    SIGNIFICANT EVENTS / STUDIES:  09/2012 - Admit at [facility] for failed MAIZE procedure, developed volvulus requiring R colectomy.  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 03/07/13 - Developed abd pain, distention at [facility].  Taken to OR in [facility] for gallbladder removal, resection of colon fistula, IVC filter placement 03/2013 - Returned back to [facility] 03/16/13, started on TPN.  Passed swallow eval 3/9 .............................................................................................................................................................................................................................................. 3/16 - Progressive abd pain / distension.  Tx to [facility] with CT abdomen with early SBO/Ileus.  Admitted by [facility]. 04/08/13 - SBO resolved.  Surgery s/o.  Developed Afib with RVR.  Cardioverted per Cardiology.  SR 3/28-3/29 with plan for d/c on 3/31 but went back into fib on 3/30 -on heparin gtt.  TPN continued.   4/08 - decompensated & Tx to ICU 4/12 - Weaning on PSV 10/5.  Awake and alert. Still has diarrhea with loose stool and mild diffused abdominal pain.  4/13 - GOC meeting planned with Palliative care; left on ATC 30 hours and  failed 4/14 - ATC 5 hours 4/15 - No acute events  LINES / TUBES: Trach (chronic)>>> LUE PICC 3/22>>>  CULTURES: Sputum 3/22>>> KLEBSIELLA BCx2 3/26>>>neg C-Diff 3/29>>>neg UC 4/2>>>neg C-Diff 4/4 >>> POSITIVE  ANTIBIOTICS: Flagyl 4/4 (Cdiff)>>>  SUBJECTIVE: ATC all day  VITAL SIGNS: Temp:  [98.2 F (36.8 C)-99.3 F (37.4 C)] 98.7 F (37.1 C) (04/17 1118) Pulse Rate:  [56-82] 71 (04/17 1118) Resp:  [17-29] 26 (04/17 1118) BP: (92-116)/(59-72) 104/59 mmHg (04/17 1118) SpO2:  [94 %-100 %] 97 % (04/17 1118) FiO2 (%):  [35 %-40 %] 35 % (04/17 1118) Weight:  [88.5 kg (195 lb 1.7 oz)] 88.5 kg (195 lb 1.7 oz) (04/17 0249)  VENTILATOR SETTINGS: Vent Mode:  [-] PRVC FiO2 (%):  [35 %-40 %] 35 % Set Rate:  [14 bmp] 14 bmp Vt Set:  [600 mL] 600 mL PEEP:  [5 cmH20] 5 cmH20 Plateau Pressure:  [19 cmH20-22 cmH20] 19 cmH20  INTAKE / OUTPUT: Intake/Output     04/16 0701 - 04/17 0700 04/17 0701 - 04/18 0700   I.V. (mL/kg) 470 (5.3)    Other     NG/GT 1380    Total Intake(mL/kg) 1850 (20.9)    Urine (mL/kg/hr) 3475 (1.6) 300 (0.6)   Stool  400 (0.8)   Total Output 3475 700   Net -1625 -700        Urine Occurrence 5 x    Stool Occurrence 2 x      PHYSICAL EXAMINATION: General:  Resting comfortably on vent HEENT: Trach site c/d/i PULM: few crackles in bases CV: Irreg irreg, normal rate AB: BS+, soft, soft, very mild diffuse tenderness, PEG site normal Ext: warm, notable dependent edema Neuro: Awake, alert, interactive  LABS:  CBC  Recent Labs Lab 04/25/13 0500 04/26/13 0515 04/28/13 0515  WBC  7.0 6.5 7.7  HGB 8.0* 7.4* 8.0*  HCT 25.6* 24.0* 27.1*  PLT 152 142* 179   Coag's No results found for this basename: APTT, INR,  in the last 168 hours BMET  Recent Labs Lab 04/27/13 0530 04/28/13 0515 04/29/13 0435  NA 147 146 144  K 3.6* 3.5* 3.9  CL 111 111 104  CO2 24 25 29   BUN 35* 26* 25*  CREATININE 0.84 0.84 0.89  GLUCOSE 174* 198* 182*    Electrolytes  Recent Labs Lab 04/23/13 0430 04/24/13 0342 04/24/13 1815 04/25/13 0500  04/27/13 0530 04/28/13 0515 04/29/13 0435  CALCIUM 8.1* 8.4  --  8.7  < > 8.5 8.6 8.6  MG 2.0  --  2.1 2.1  --   --   --   --   PHOS 4.8* 2.6  --  2.9  --   --   --   --   < > = values in this interval not displayed. Liver Enzymes No results found for this basename: AST, ALT, ALKPHOS, BILITOT, ALBUMIN,  in the last 168 hours Glucose  Recent Labs Lab 04/28/13 1704 04/28/13 1944 04/28/13 2352 04/29/13 0356 04/29/13 0734 04/29/13 1140  GLUCAP 162* 132* 156* 150* 170* 167*   IMAGING:  Dg Chest Port 1 View  04/28/2013   CLINICAL DATA:  Respiratory failure  EXAM: PORTABLE CHEST - 1 VIEW  COMPARISON:  DG CHEST 1V PORT dated 04/25/2013  FINDINGS: Normal cardiac silhouette. Atrial appendage clip noted. Tracheostomy tube unchanged. Bilateral pleural effusions not changed. Mild central venous congestion is unchanged. No pneumothorax. Left PICC line noted.  IMPRESSION: 1. No interval change. 2. Bibasilar effusions in central venous congestion.   Electronically Signed   By: Genevive BiStewart  Edmunds M.D.   On: 04/28/2013 08:04      ASSESSMENT / PLAN:  PULMONARY A: Acute on chronic respiratory failure - no clear lung problem; limited by weakness and pulm edema Chronic Tracheostomy, OSA  Stable L effusion COPD? No acute bronchospasm P:   - ATC during day as tolerated, vent QHS - dispo LTAC ideal - Xopenex Q6 PRN  CARDIOVASCULAR A:  AF / RVR rate controlled HTN HLD CAD, s/p cath with PCI Pulm edema (preserved LVEF) > clarified with son, furosemide did NOT cause ototoxicity; IV furosemide OK per son P:  - Continue Amiodarone, Cardizem, - Continue metoprolol per peg - Continue Lipitor. - PRN Lopressor for HR >110. - cont Xarelto for hx of PE and dvt proph - furosemide IV x2 doses again today  RENAL A:   Hypokalemia  P:   - Trend BMP daily - Replete electrolytes as  indicated  GASTROINTESTINAL A:   Dysphagia GERD C-Diff Pseudomembranous colitis Chronic Abdominal pain> normal belly exam, related to tube feeds? P:   - simethicone - ask dietary to reassess tube feeds> contributing to cramping? - check LFT's - swallow eval once on ATC >48 hours  HEMATOLOGIC A:   Chronic anemia P:  - Trend CBC  INFECTIOUS A:   Pseudomembranous colitis P:   - cont Flagyl  Through 4/18 (convert to per tube 4/15)  ENDOCRINE A:   DM II P:   - SSI,  q4h  NEUROLOGIC A:   Acute encephalopathy > resolved P:   - Morphine PRN - Mobilize, PT efforts as able > educated him that he needs to participate with PT more   GLOBAL: -[facility] -[employer] reconsidering LTAC benefit post MD conversation 4/15 -need to work on motivation  for PT, he is not giving up, wants to get better, but says he "is afraid" of getting up to a chair [facility] does have bed available as of 4/13 -Apparently Mr. Shirlee LatchMcLean has indicated to staff that he wants to talk to Palliative care again today.  When I have brought up overall goals of care this week he said that he wanted to press on, yet his behavior indicated otherwise (refusing to work with PT).  He attributed his refusal to a phobia of getting up into the chair (which I thought was a bit strange).  So I agree with invoking palliative medicine again, we will need to make sure we keep his son in the loop with this conversation.   Heber CarolinaBrent McQuaid, MD [place] [facility] Pager: [phone] Cell: [phone] If no response, call [phone]   04/29/2013 12:52 PM

## 2013-04-29 NOTE — Progress Notes (Signed)
Physical Therapy Discharge Patient Details Name: Tim Short MRN: [id number] DOB: 12/17/1934 Today's Date: 04/29/2013 Time:  -     Patient discharged from PT services secondary to pt clearly stating he does not want to work with therapy (and understands he will get weaker). He wants to "go home to Tim Short." . OT also present and contacted Palliative Care (at pt's request) to re-visit patient and discusss his current wishes.  Please see latest therapy progress note for current level of functioning and progress toward goals.    Progress and discharge plan discussed with patient and/or caregiver: Patient agrees with plan  IF pt decides he wants to improve and participate with therapies, please re-order.Thank you.  GP     Zena AmosLynn P Kalyn Dimattia Pager [phone]  04/29/2013, 11:13 AM

## 2013-04-29 NOTE — Progress Notes (Signed)
Occupational Therapy Discharge Patient Details Name: Rush BarerJames B Letts MRN: [id number] DOB: 09/06/1934 Today's Date: 04/29/2013 Time:  -     Patient discharged from OT services secondary to in to see pt for therapy with PT also. Informed pt we were going to help him get up to recliner and he said "no I am not, I don't want to and don't feel like it". Then offered to just get up to EOB and he again said no for the same reasons. I asked he realized what staying in the bed meant as far as only getting weaker and he said yes, that he is just worn out. Informed him that I had read the Pallative Team Note from 4/13 and it said that he wanted to work towards getting better--he agreed but now he has changed his mind, "I am ready to go home" when asked what home he said, "[place]". RN made aware of this conversation and Pallative care services called. OT will sign off.  Please see latest therapy progress note for current level of functioning and progress toward goals.    Progress and discharge plan discussed with patient       Evette GeorgesCatherine Eva Ashika Apuzzo [phone] 04/29/2013, 10:59 AM

## 2013-04-29 NOTE — Progress Notes (Signed)
MD notified and aware that pt has 6 portex trach with no inner cannula and that trach has not been changed.

## 2013-04-29 NOTE — Progress Notes (Signed)
Heber CarolinaBrent McQuaid, MD [place] [facility] Pager: [phone] Cell: [phone] If no response, call [phone]

## 2013-04-29 NOTE — Progress Notes (Signed)
Dr. Kendrick FriesMcQuaid called regarding patient's portex trach that has not been changed since admission. Per Dr. Kendrick FriesMcQuaid do not change and the healthcare team needs to address this issue tomorrow 04/30/13. Sticky note left for physicians.  Respiratory aware of this communication. Will continue to monitor.  - Alwyn RenJamie Shauntelle Jamerson, RN

## 2013-04-30 LAB — BASIC METABOLIC PANEL
BUN: 30 mg/dL — AB (ref 6–23)
CALCIUM: 8.9 mg/dL (ref 8.4–10.5)
CO2: 28 mEq/L (ref 19–32)
CREATININE: 0.84 mg/dL (ref 0.50–1.35)
Chloride: 108 mEq/L (ref 96–112)
GFR, EST NON AFRICAN AMERICAN: 82 mL/min — AB (ref >90)
Glucose, Bld: 147 mg/dL — ABNORMAL HIGH (ref 70–99)
POTASSIUM: 4.3 meq/L (ref 3.7–5.3)
Sodium: 148 mEq/L — ABNORMAL HIGH (ref 137–147)

## 2013-04-30 LAB — GLUCOSE, CAPILLARY
GLUCOSE-CAPILLARY: 156 mg/dL — AB (ref 70–99)
Glucose-Capillary: 124 mg/dL — ABNORMAL HIGH (ref 70–99)
Glucose-Capillary: 153 mg/dL — ABNORMAL HIGH (ref 70–99)
Glucose-Capillary: 153 mg/dL — ABNORMAL HIGH (ref 70–99)
Glucose-Capillary: 167 mg/dL — ABNORMAL HIGH (ref 70–99)

## 2013-04-30 NOTE — Progress Notes (Signed)
PULMONARY / CRITICAL CARE MEDICINE   Name: Tim Short MRN: [id number] DOB: 01-27-1934    ADMISSION DATE:  03/28/2013 CONSULTATION DATE:  04/20/2013  REFERRING MD :  Dr. Vanessa Barbara PRIMARY SERVICE:  PCCM  CHIEF COMPLAINT:  AMS / Hypoxia   BRIEF PATIENT DESCRIPTION: 79 y/o with complicated, prolonged illness since 09/2012 with respiratory failure, tracheostomy, dysphagia, cholecystitis s/p cholecystectomy, colon fistula, IVC filter placement, SBO, AF with RVR.  4/8 patient decompensated on medical floor with AMS and hypoxia.  PCCM consulted for evaluation.    SIGNIFICANT EVENTS / STUDIES:  09/2012 - Admit at [facility] for failed MAIZE procedure, developed volvulus requiring R colectomy.  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 03/07/13 - Developed abd pain, distention at [facility].  Taken to OR in [facility] for gallbladder removal, resection of colon fistula, IVC filter placement 03/2013 - Returned back to [facility] 03/16/13, started on TPN.  Passed swallow eval 3/9 .............................................................................................................................................................................................................................................. 3/16 - Progressive abd pain / distension.  Tx to [facility] with CT abdomen with early SBO/Ileus.  Admitted by [facility]. 04/08/13 - SBO resolved.  Surgery s/o.  Developed Afib with RVR.  Cardioverted per Cardiology.  SR 3/28-3/29 with plan for d/c on 3/31 but went back into fib on 3/30 -on heparin gtt.  TPN continued.   4/08 - decompensated & Tx to ICU 4/12 - Weaning on PSV 10/5.  Awake and alert. Still has diarrhea with loose stool and mild diffused abdominal pain.  4/13 - GOC meeting planned with Palliative care; left on ATC 30 hours and  failed 4/14 - ATC 5 hours 4/15 - No acute events  LINES / TUBES: Trach (chronic)>>> LUE PICC 3/22>>>  CULTURES: Sputum 3/22>>> KLEBSIELLA BCx2 3/26>>>neg C-Diff 3/29>>>neg UC 4/2>>>neg C-Diff 4/4 >>> POSITIVE  ANTIBIOTICS: Flagyl 4/4 (Cdiff)>>>I  4/15>>  SUBJECTIVE: ATC all day. C/O "tired",  both legs hurt "a little"  VITAL SIGNS: Temp:  [98.3 F (36.8 C)-98.7 F (37.1 C)] 98.7 F (37.1 C) (04/18 0815) Pulse Rate:  [52-82] 82 (04/18 0815) Resp:  [14-26] 17 (04/18 0815) BP: (88-117)/(55-74) 101/64 mmHg (04/18 0815) SpO2:  [96 %-100 %] 98 % (04/18 0815) FiO2 (%):  [35 %-40 %] 35 % (04/18 0815) Weight:  [198 lb 10.2 oz (90.1 kg)] 198 lb 10.2 oz (90.1 kg) (04/18 0409)  VENTILATOR SETTINGS: Vent Mode:  [-] PRVC FiO2 (%):  [35 %-40 %] 35 % Set Rate:  [14 bmp] 14 bmp Vt Set:  [600 mL] 600 mL PEEP:  [5 cmH20] 5 cmH20 Plateau Pressure:  [20 cmH20-27 cmH20] 20 cmH20  INTAKE / OUTPUT: Intake/Output     04/17 0701 - 04/18 0700 04/18 0701 - 04/19 0700   I.V. (mL/kg) 470 (5.2)    NG/GT 497.1    Total Intake(mL/kg) 967.1 (10.7)    Urine (mL/kg/hr) 3100 (1.4)    Stool 400 (0.2)    Total Output 3500     Net -2532.9            PHYSICAL EXAMINATION: General:  Resting comfortably on vent. Appeared resting w/ eyes closed, responded to my voice HEENT: Trach site c/d/i PULM: few crackles in bases CV: Irreg irreg, normal rate AB: BS+, soft, soft, very mild diffuse tenderness, PEG site normal Ext: warm, notable dependent edema. Heel pad dressing Neuro: Awake, alert, interactive, able to smile at me.  LABS:  CBC  Recent Labs  Lab 04/25/13 0500 04/26/13 0515 04/28/13 0515  WBC 7.0 6.5 7.7  HGB 8.0* 7.4* 8.0*  HCT 25.6* 24.0* 27.1*  PLT 152 142* 179   Coag's No results found for this basename: APTT, INR,  in the last 168 hours BMET  Recent Labs Lab 04/28/13 0515 04/29/13 0435 04/30/13 0530  NA 146 144 148*  K 3.5* 3.9 4.3  CL 111 104 108  CO2 25 29 28   BUN  26* 25* 30*  CREATININE 0.84 0.89 0.84  GLUCOSE 198* 182* 147*   Electrolytes  Recent Labs Lab 04/24/13 0342 04/24/13 1815 04/25/13 0500  04/28/13 0515 04/29/13 0435 04/30/13 0530  CALCIUM 8.4  --  8.7  < > 8.6 8.6 8.9  MG  --  2.1 2.1  --   --   --   --   PHOS 2.6  --  2.9  --   --   --   --   < > = values in this interval not displayed. Liver Enzymes No results found for this basename: AST, ALT, ALKPHOS, BILITOT, ALBUMIN,  in the last 168 hours Glucose  Recent Labs Lab 04/29/13 1140 04/29/13 1711 04/29/13 2004 04/30/13 0010 04/30/13 0418 04/30/13 0820  GLUCAP 167* 126* 136* 124* 153* 153*   IMAGING:  No results found.    ASSESSMENT / PLAN:  PULMONARY A: Acute on chronic respiratory failure - no clear lung problem; limited by weakness and pulm edema Chronic Tracheostomy, OSA  Stable L effusion COPD? No acute bronchospasm Staying on vent P:   - ATC during day as tolerated, vent QHS - dispo LTAC ideal - Xopenex Q6 PRN  CARDIOVASCULAR A:  AF / RVR rate controlled HTN HLD CAD, s/p cath with PCI Pulm edema (preserved LVEF) > clarified with son, furosemide did NOT cause ototoxicity; IV furosemide OK per son P:  - Continue Amiodarone, Cardizem, - Continue metoprolol per peg - Continue Lipitor. - PRN Lopressor for HR >110. - cont Xarelto for hx of PE and dvt proph - furosemide IV x2 doses yesterday- observe today 4/18  RENAL A:   Hypokalemia  P:   - Trend BMP daily - Replete electrolytes as indicated  GASTROINTESTINAL A:   Dysphagia GERD C-Diff Pseudomembranous colitis Chronic Abdominal pain> normal belly exam, related to tube feeds? P:   - simethicone - ask dietary to reassess tube feeds> contributing to cramping? - check LFT's - swallow eval once on ATC >48 hours  HEMATOLOGIC A:   Chronic anemia- Hg 8 P:  - Trend CBC  INFECTIOUS A:   Pseudomembranous colitis P:   - cont Flagyl  Through 4/18 (convert to per tube  4/15)  ENDOCRINE A:   DM II P:   - SSI,  q4h  NEUROLOGIC A:   Acute encephalopathy > resolved P:   - Morphine PRN - Mobilize, PT efforts as able > educated him that he needs to participate with PT more   GLOBAL: Ongoing plan [facility]  -[employer] reconsidering LTAC benefit post MD conversation 4/15 -need to work on motivation for PT, he is not giving up, wants to get better, but says he "is afraid" of getting up to a chair [facility] does have bed available as of 4/13 -Apparently Mr. Shirlee LatchMcLean has indicated to staff that he wants to talk to Palliative care again today.  When I have brought up overall goals of care this week he said that he wanted to press on, yet his behavior indicated otherwise (refusing to work  with PT).  He attributed his refusal to a phobia of getting up into the chair (which I thought was a bit strange).  So I agree with invoking palliative medicine again, we will need to make sure we keep his son in the loop with this conversation.   Terisa StarrD Tim Cutsforth, MD [place] [facility] Pager: [phone] Cell: [phone] If no response, call [phone]   04/30/2013 9:31 AM

## 2013-04-30 NOTE — Progress Notes (Signed)
Notified PCCM that pts. bp was 88/62. MD said to wait for next bp rotation. Next bp was [id number].

## 2013-05-01 LAB — GLUCOSE, CAPILLARY
GLUCOSE-CAPILLARY: 154 mg/dL — AB (ref 70–99)
GLUCOSE-CAPILLARY: 169 mg/dL — AB (ref 70–99)
GLUCOSE-CAPILLARY: 182 mg/dL — AB (ref 70–99)
Glucose-Capillary: 161 mg/dL — ABNORMAL HIGH (ref 70–99)
Glucose-Capillary: 171 mg/dL — ABNORMAL HIGH (ref 70–99)
Glucose-Capillary: 147 mg/dL — ABNORMAL HIGH (ref 70–99)
Glucose-Capillary: 159 mg/dL — ABNORMAL HIGH (ref 70–99)

## 2013-05-01 NOTE — Progress Notes (Signed)
PULMONARY / CRITICAL CARE MEDICINE   Name: Tim Short MRN: [id number] DOB: 02/21/1934    ADMISSION DATE:  03/28/2013 CONSULTATION DATE:  04/20/2013  REFERRING MD :  Dr. Vanessa BarbaraZamora PRIMARY SERVICE:  PCCM  CHIEF COMPLAINT:  AMS / Hypoxia   BRIEF PATIENT DESCRIPTION: 79 y/o with complicated, prolonged illness since 09/2012 with respiratory failure, tracheostomy, dysphagia, cholecystitis s/p cholecystectomy, colon fistula, IVC filter placement, SBO, AF with RVR.  4/8 patient decompensated on medical floor with AMS and hypoxia.  PCCM consulted for evaluation.    SIGNIFICANT EVENTS / STUDIES:  09/2012 - [facility], developed volvulus requiring R colectomy.  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 03/07/13 - Developed abd pain, distention at [facility].  [facility] for gallbladder removal, resection of colon fistula, IVC filter placement 03/2013 - Returned back to [facility] 03/16/13, started on TPN.  Passed swallow eval 3/9 .............................................................................................................................................................................................................................................. 3/16 - Progressive abd pain / distension.  [facility] abdomen with early SBO/Ileus.  [facility] - SBO resolved.  Surgery s/o.  Developed Afib with RVR.  Cardioverted per Cardiology.  SR 3/28-3/29 with plan for d/c on 3/31 but went back into fib on 3/30 -on heparin gtt.  TPN continued.   4/08 - decompensated & Tx to ICU 4/12 - Weaning on PSV 10/5.  Awake and alert. Still has diarrhea with loose stool and mild diffused abdominal pain.  4/13 - GOC meeting planned with Palliative care; left on ATC 30 hours and  failed 4/14 - ATC 5 hours 4/15 - No acute events  LINES / TUBES: Trach (chronic)>>> LUE PICC 3/22>>>  CULTURES: Sputum 3/22>>> KLEBSIELLA BCx2 3/26>>>neg C-Diff 3/29>>>neg UC 4/2>>>neg C-Diff 4/4 >>> POSITIVE  ANTIBIOTICS: Flagyl 4/4 (Cdiff)>>>I  4/15>>  SUBJECTIVE: Indicates breathing comfortably. Sleeps poorly but admits no activity except to lie in bed all day. Shook his head vigorously when asked about getting out of bed.  VITAL SIGNS: Temp:  [98.2 F (36.8 C)-99.3 F (37.4 C)] 98.6 F (37 C) (04/19 0753) Pulse Rate:  [37-96] 40 (04/19 0900) Resp:  [16-25] 20 (04/19 0900) BP: (88-118)/(53-86) 104/61 mmHg (04/19 0900) SpO2:  [89 %-100 %] 98 % (04/19 0900) FiO2 (%):  [35 %-40 %] 35 % (04/19 0749) Weight:  [188 lb 7.9 oz (85.5 kg)] 188 lb 7.9 oz (85.5 kg) (04/19 0300)  VENTILATOR SETTINGS: Vent Mode:  [-] PRVC FiO2 (%):  [35 %-40 %] 35 % Set Rate:  [16 bmp] 16 bmp Vt Set:  [600 mL] 600 mL PEEP:  [5 cmH20] 5 cmH20 Plateau Pressure:  [15 cmH20-21 cmH20] 18 cmH20  INTAKE / OUTPUT: Intake/Output     04/18 0701 - 04/19 0700 04/19 0701 - 04/20 0700   I.V. (mL/kg) 510 (6) 60 (0.7)   NG/GT 1262 165   Total Intake(mL/kg) 1772 (20.7) 225 (2.6)   Urine (mL/kg/hr) 1135 (0.6)    Stool     Total Output 1135     Net +637 +225        Urine Occurrence  1 x   Stool Occurrence 1 x 1 x     PHYSICAL EXAMINATION: General:  Resting comfortably on vent. Appeared resting w/ eyes closed, responded to my voice. Strong grip on handshake. HEENT: Trach site c/d/i. PMV not in place PULM: few crackles in bases CV:  Irreg irreg, normal rate AB: BS+, soft, soft, very mild diffuse tenderness, PEG site normal Ext: warm, notable dependent edema. Heel pad dressing Neuro: Awake, alert, interactive, able to smile at me.  LABS:  CBC  Recent Labs Lab 04/25/13 0500 04/26/13 0515 04/28/13 0515  WBC 7.0 6.5 7.7  HGB 8.0* 7.4* 8.0*  HCT 25.6* 24.0* 27.1*  PLT 152 142* 179    Coag's No results found for this basename: APTT, INR,  in the last 168 hours BMET  Recent Labs Lab 04/28/13 0515 04/29/13 0435 04/30/13 0530  NA 146 144 148*  K 3.5* 3.9 4.3  CL 111 104 108  CO2 25 29 28   BUN 26* 25* 30*  CREATININE 0.84 0.89 0.84  GLUCOSE 198* 182* 147*   Electrolytes  Recent Labs Lab 04/24/13 1815 04/25/13 0500  04/28/13 0515 04/29/13 0435 04/30/13 0530  CALCIUM  --  8.7  < > 8.6 8.6 8.9  MG 2.1 2.1  --   --   --   --   PHOS  --  2.9  --   --   --   --   < > = values in this interval not displayed. Liver Enzymes No results found for this basename: AST, ALT, ALKPHOS, BILITOT, ALBUMIN,  in the last 168 hours Glucose  Recent Labs Lab 04/30/13 1215 04/30/13 1629 04/30/13 2026 04/30/13 2331 05/01/13 0459 05/01/13 0751  GLUCAP 167* 156* 154* 169* 161* 171*   IMAGING:  No results found.    ASSESSMENT / PLAN:  PULMONARY A: Acute on chronic respiratory failure - no clear lung problem; limited by weakness and pulm edema Chronic Tracheostomy, OSA  Stable L effusion COPD? No acute bronchospasm Staying on vent- unchanged P:   - ATC during day as tolerated, vent QHS - dispo LTAC ideal - Xopenex Q6 PRN  CARDIOVASCULAR A:  AF / RVR rate controlled HTN HLD CAD, s/p cath with PCI Pulm edema (preserved LVEF) > clarified with son, furosemide did NOT cause ototoxicity; IV furosemide OK per son P:  - Continue Amiodarone, Cardizem, - Continue metoprolol per peg - Continue Lipitor. - PRN Lopressor for HR >110. - cont Xarelto for hx of PE and dvt proph - furosemide IV x2 doses yesterday- observe today 4/18  RENAL A:   Hypokalemia  P:   - Trend BMP daily - Replete electrolytes as indicated  GASTROINTESTINAL A:   Dysphagia GERD C-Diff Pseudomembranous colitis Chronic Abdominal pain> normal belly exam, related to tube feeds? P:   - simethicone - ask dietary to reassess tube feeds> contributing to cramping? - check LFT's -  swallow eval once on ATC >48 hours  HEMATOLOGIC A:   Chronic anemia- Hg 8 P:  - Trend CBC  INFECTIOUS A:   Pseudomembranous colitis P:   - cont Flagyl  Through 4/18 (convert to per tube 4/15)  ENDOCRINE A:   DM II P:   - SSI,  q4h  NEUROLOGIC A:   Acute encephalopathy > resolved P:   - Morphine PRN - Mobilize, PT efforts as able > educated him that he needs to participate with PT more   GLOBAL: Ongoing plan- Dr Kendrick Fries -Goal is transfer to [facility]  -[employer] reconsidering LTAC benefit post MD conversation 4/15 -need to work on motivation for PT, he is not giving up, wants to get better, but says he "is afraid" of getting up to a chair [facility] does have bed available as of 4/13 -Apparently Mr. Nickson has indicated to staff that he wants  to talk to Palliative care again.  When brought up overall goals of care this week he said that he wanted to press on, yet his behavior indicated otherwise (refusing to work with PT).  He attributed his refusal to a phobia of getting up into the chair (which I thought was a bit strange).  So I agree with invoking palliative medicine again, we will need to make sure we keep his son in the loop with this conversation.   Terisa StarrD Ennio Houp, MD [facility] Pager: [phone] Cell: [phone] If no response, call [phone]   05/01/2013 10:48 AM

## 2013-05-02 LAB — GLUCOSE, CAPILLARY
GLUCOSE-CAPILLARY: 151 mg/dL — AB (ref 70–99)
GLUCOSE-CAPILLARY: 129 mg/dL — AB (ref 70–99)
GLUCOSE-CAPILLARY: 154 mg/dL — AB (ref 70–99)
GLUCOSE-CAPILLARY: 178 mg/dL — AB (ref 70–99)
Glucose-Capillary: 132 mg/dL — ABNORMAL HIGH (ref 70–99)
Glucose-Capillary: 155 mg/dL — ABNORMAL HIGH (ref 70–99)

## 2013-05-02 NOTE — Progress Notes (Signed)
[facility] vent SNF submitting for insurance auth today, will update CSW tomorrow concerning bed availability.   Maryclare LabradorJulie Edna Rede, MSW, [facility] Clinical Social Worker [phone]

## 2013-05-02 NOTE — Progress Notes (Signed)
PULMONARY / CRITICAL CARE MEDICINE   Name: Tim Short MRN: [id number] DOB: 10/09/1934    ADMISSION DATE:  03/28/2013 CONSULTATION DATE:  04/20/2013  REFERRING MD :  Dr. Vanessa BarbaraZamora PRIMARY SERVICE:  PCCM  CHIEF COMPLAINT:  AMS / Hypoxia   BRIEF PATIENT DESCRIPTION: 79 y/o with complicated, prolonged illness since 09/2012 with respiratory failure, tracheostomy, dysphagia, cholecystitis s/p cholecystectomy, colon fistula, IVC filter placement, SBO, AF with RVR.  4/8 patient decompensated on medical floor with AMS and hypoxia.  PCCM consulted for evaluation.    SIGNIFICANT EVENTS / STUDIES:  09/2012 - [facility], developed volvulus requiring R colectomy.  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 03/07/13 - Developed abd pain, distention at [facility].  [facility] for gallbladder removal, resection of colon fistula, IVC filter placement 03/2013 - Returned back to [facility] 03/16/13, started on TPN.  Passed swallow eval 3/9 .............................................................................................................................................................................................................................................. 3/16 - Progressive abd pain / distension.  [facility] abdomen with early SBO/Ileus.  [facility] - SBO resolved.  Surgery s/o.  Developed Afib with RVR.  Cardioverted per Cardiology.  SR 3/28-3/29 with plan for d/c on 3/31 but went back into fib on 3/30 -on heparin gtt.  TPN continued.   4/08 - decompensated & Tx to ICU 4/12 - Weaning on PSV 10/5.  Awake and alert. Still has diarrhea with loose stool and mild diffused abdominal pain.  4/13 - GOC meeting planned with Palliative care; left on ATC 30 hours and  failed 4/14 - ATC 5 hours 4/15 - No acute events 4/20 will need vent snf  LINES / TUBES: Trach (chronic)>>> LUE PICC 3/22>>>  CULTURES: Sputum 3/22>>> KLEBSIELLA BCx2 3/26>>>neg C-Diff 3/29>>>neg UC 4/2>>>neg C-Diff 4/4 >>> POSITIVE  ANTIBIOTICS: Flagyl 4/4 (Cdiff)>>>I  4/15>>4/20  SUBJECTIVE: Follows commands. Denies acute issues   VITAL SIGNS: Temp:  [98 F (36.7 C)-98.6 F (37 C)] 98.4 F (36.9 C) (04/20 0752) Pulse Rate:  [37-91] 83 (04/20 0903) Resp:  [13-24] 14 (04/20 0752) BP: (87-113)/(43-87) 107/52 mmHg (04/20 0903) SpO2:  [94 %-100 %] 95 % (04/20 0752) FiO2 (%):  [35 %] 35 % (04/20 0752) Weight:  [89.2 kg (196 lb 10.4 oz)] 89.2 kg (196 lb 10.4 oz) (04/20 0416)  VENTILATOR SETTINGS: Vent Mode:  [-] PRVC FiO2 (%):  [35 %] 35 % Set Rate:  [16 bmp] 16 bmp Vt Set:  [600 mL] 600 mL PEEP:  [5 cmH20] 5 cmH20 Plateau Pressure:  [16 cmH20-19 cmH20] 19 cmH20  INTAKE / OUTPUT: Intake/Output     04/19 0701 - 04/20 0700 04/20 0701 - 04/21 0700   I.V. (mL/kg) 464.8 (5.2) 10 (0.1)   NG/GT 1320    Total Intake(mL/kg) 1784.8 (20) 10 (0.1)   Urine (mL/kg/hr) 850 (0.4) 150 (0.4)   Total Output 850 150   Net +934.8 -140        Urine Occurrence 2 x    Stool Occurrence 2 x 1 x     PHYSICAL EXAMINATION: General:  Resting comfortably ont-collar. Neuro intact HEENT: Trach site c/d/i. PMV not in place PULM: diminished in bases  CV: Irreg irreg, normal rate AB: BS+, soft, soft, very mild diffuse tenderness, PEG site normal Ext: warm, notable dependent edema. Heel pad dressing Neuro: Awake, alert, interactive, able to smile.  LABS:  CBC  Recent Labs Lab 04/26/13 0515 04/28/13 0515  WBC 6.5 7.7  HGB 7.4* 8.0*  HCT 24.0* 27.1*  PLT 142* 179   Coag's No results found for this basename: APTT, INR,  in the last 168 hours BMET  Recent Labs Lab 04/28/13 0515 04/29/13 0435 04/30/13 0530  NA 146 144 148*  K 3.5* 3.9 4.3  CL 111 104 108  CO2 25 29 28   BUN  26* 25* 30*  CREATININE 0.84 0.89 0.84  GLUCOSE 198* 182* 147*   Electrolytes  Recent Labs Lab 04/28/13 0515 04/29/13 0435 04/30/13 0530  CALCIUM 8.6 8.6 8.9   Liver Enzymes No results found for this basename: AST, ALT, ALKPHOS, BILITOT, ALBUMIN,  in the last 168 hours Glucose  Recent Labs Lab 05/01/13 1209 05/01/13 1556 05/01/13 1943 05/02/13 0027 05/02/13 0415 05/02/13 0755  GLUCAP 147* 159* 182* 155* 132* 151*   IMAGING:  No results found.    ASSESSMENT / PLAN:  PULMONARY A: Acute on chronic respiratory failure - no clear lung problem; limited by weakness and pulm edema Chronic Tracheostomy, OSA  Stable L effusion - small COPD? No acute bronchospasm  P:   - ATC during day as tolerated, vent QHS, no changes -pmv need to increase this, will educate pt - dispo LTAC ideal - Xopenex Q6 PRN  CARDIOVASCULAR A:  AF / RVR rate controlled HTN HLD CAD, s/p cath with PCI Pulm edema (preserved LVEF) > clarified with son, furosemide did NOT cause ototoxicity; IV furosemide OK per son P:  - Continue Amiodarone, Cardizem, - Continue metoprolol per peg - Continue Lipitor. - PRN Lopressor for HR >110. - cont Xarelto for hx of PE and dvt proph  RENAL A:   Hypokalemia resolved P:   - Trend BMP daily -check labs on 4/21  GASTROINTESTINAL A:   Dysphagia GERD C-Diff Pseudomembranous colitis Chronic Abdominal pain> normal belly exam, related to tube feeds? P:   - simethicone - ask dietary to reassess tube feeds> contributing to cramping? - check LFT's - swallow eval once on ATC >48 hours  HEMATOLOGIC A:   Chronic anemia- Hg 8 P:  - Trend CBC  INFECTIOUS A:   Pseudomembranous colitis P:   - cont Flagyl  Through 4/18 (convert to per tube 4/15) dc 4/20 Has received over 2 weeks If reoccurs to oral vanc Limit empiric abx  ENDOCRINE A:   DM II P:   - SSI,  q4h  NEUROLOGIC A:   Acute encephalopathy > resolved P:   - Morphine PRN -  Mobilize, PT efforts as able > educated him that he needs to participate with PT more   GLOBAL: Ongoing plan- -Goal is [facility] term care . [facility] -Palliative care involved in his care at pt request  [facility] Minor ACNP Tim Short PCCM Pager [phone] till 3 pm If no answer page [phone] 05/02/2013, 10:57 AM  I have fully examined this patient and agree with above findings.      Mcarthur Rossettianiel J. Tyson AliasFeinstein, MD, FACP Pgr: [phone]  Pulmonary & Critical Care

## 2013-05-02 NOTE — Progress Notes (Signed)
Progress Note from the Palliative Medicine Team at [facility]  Subjective:    --patient is resting comfortably,  NAD, continues to communicate desire for less aggressive approach to care but is allowing his son to be the main decision maker at this time  -allowed space and time for patient to verbalize questions or concerns  -spoke to son by telephone, offered support.  He verbalizes appreciation, remains hopeful for improvement for his father and plan is SNF for rehabilitation and to "take one day at a time"  Objective: Allergies  Allergen Reactions  . Corticosteroids     Inhaled Corticosteroids--Hoarseness, dry mouth  . Furosemide Other (See Comments)    Discussed with son 4/15, mild reaction (rash?) with oral formulation only    Scheduled Meds: . amiodarone  200 mg Oral BID  . antiseptic oral rinse  15 mL Mouth Rinse QID  . atorvastatin  40 mg Oral Daily  . chlorhexidine  15 mL Mouth Rinse BID  . diltiazem  120 mg Oral TID WC & HS  . feeding supplement (PRO-STAT SUGAR FREE 64)  30 mL Per Tube BID  . hydrocortisone cream   Topical BID  . insulin aspart  0-15 Units Subcutaneous 6 times per day  . metoprolol tartrate  25 mg Oral BID  . rivaroxaban  20 mg Oral Q supper  . sodium chloride  10-40 mL Intracatheter Q12H   Continuous Infusions: . sodium chloride 10 mL/hr (05/02/13 0429)  . feeding supplement (OSMOLITE 1.5 CAL) 1,000 mL (05/02/13 1011)   PRN Meds:.acetaminophen (TYLENOL) oral liquid 160 mg/5 mL, alum & mag hydroxide-simeth, levalbuterol, metoprolol, midazolam, morphine injection, naphazoline, nitroGLYCERIN, ondansetron (ZOFRAN) IV, simethicone, sodium chloride, vitamin A & D  BP 102/51  Pulse 73  Temp(Src) 98.4 F (36.9 C) (Oral)  Resp 25  Ht 5' 8.9" (1.75 m)  Wt 89.2 kg (196 lb 10.4 oz)  BMI 29.13 kg/m2  SpO2 94%   PPS:30 % at best    Intake/Output Summary (Last 24 hours) at 05/02/13 1322 Last data filed at 05/02/13 1140  Gross per 24 hour  Intake  1419.83 ml  Output   1150 ml  Net 269.83 ml       Physical Exam:  General: resting, NAD HEENT:  Mm, noted white exudate unable to wipe off/trach site noted clean and intact Chest:   Decreased in bases CVS: irregular Abdomen:  PEG site unremarkable, + BS Neuro: oriented to person and place Psych: in good spirits, calm and cooperative  Labs: CBC    Component Value Date/Time   WBC 7.7 04/28/2013 0515   RBC 2.78* 04/28/2013 0515   HGB 8.0* 04/28/2013 0515   HCT 27.1* 04/28/2013 0515   PLT 179 04/28/2013 0515   MCV 97.5 04/28/2013 0515   MCH 28.8 04/28/2013 0515   MCHC 29.5* 04/28/2013 0515   RDW 19.5* 04/28/2013 0515   LYMPHSABS 1.7 04/28/2013 0515   MONOABS 0.6 04/28/2013 0515   EOSABS 0.1 04/28/2013 0515   BASOSABS 0.0 04/28/2013 0515    BMET    Component Value Date/Time   NA 148* 04/30/2013 0530   K 4.3 04/30/2013 0530   CL 108 04/30/2013 0530   CO2 28 04/30/2013 0530   GLUCOSE 147* 04/30/2013 0530   BUN 30* 04/30/2013 0530   CREATININE 0.84 04/30/2013 0530   CALCIUM 8.9 04/30/2013 0530   GFRNONAA 82* 04/30/2013 0530   GFRAA >90 04/30/2013 0530    CMP     Component Value Date/Time   NA 148* 04/30/2013  0530   K 4.3 04/30/2013 0530   CL 108 04/30/2013 0530   CO2 28 04/30/2013 0530   GLUCOSE 147* 04/30/2013 0530   BUN 30* 04/30/2013 0530   CREATININE 0.84 04/30/2013 0530   CALCIUM 8.9 04/30/2013 0530   PROT 5.1* 04/21/2013 0300   ALBUMIN 2.3* 04/21/2013 0300   AST 19 04/21/2013 0300   ALT 26 04/21/2013 0300   ALKPHOS 66 04/21/2013 0300   BILITOT 0.3 04/21/2013 0300   GFRNONAA 82* 04/30/2013 0530   GFRAA >90 04/30/2013 0530   Assessment and Plan  1.  Full Code 2.  Continue with all available medical interventions to prolong life 3   Discharge to SNF when medically stable for rehabilitation  4. Symptom management :         Weakness:  PT/OT as tolerated   Patient Documents Completed or Given: Document Given Completed  Advanced Directives Pkt    MOST yes   DNR    Gone from My Sight     Hard Choices yes     Time In Time Out Total Time Spent with Patient Total Overall Time  1100 1030 25 min 30 min    Greater than 50%  of this time was spent counseling and coordinating care related to the above assessment and plan.  Lorinda CreedMary Jeani Fassnacht NP  Palliative Medicine Team Team Phone # [phone] Pager [phone]  1

## 2013-05-02 NOTE — Progress Notes (Addendum)
Patient WU:[id number]:Tim Short      DOB: 02/11/1934      JYN:[id number]  Palliative Medicine continues to follow with this patient and his son updated Dr. Maple HudsonYoung.  Patient reports pain in abd and legs no different than it has been and treatable with oral pain meds.  He looks forward with respeaking with our NP with whom he has been communicating his goals. No respiratory distress and affect appropriate, watching TV.  Ulonda Klosowski L. Ladona Ridgelaylor, MD MBA The Palliative Medicine Team at [facility] Team Phone: [phone] Pager: [phone]

## 2013-05-02 NOTE — Progress Notes (Signed)
RT Note: RT was called to patients room due to low Spo2 in the high 80"s. Patients RN had suctioned him but he still had a large amount of thick white secretions upon my arrival. Rt suctioned patient and got out a large amount of secretions. Patient tolerated suctioning well with no issues. He continues on his 35% ATC with an Spo2 of 100%. Rt will continue to monitor.

## 2013-05-02 NOTE — Consult Note (Signed)
I have reviewed and discussed the care of this patient in detail with the nurse practitioner including pertinent patient records, physical exam findings and data. I agree with details of this encounter.  

## 2013-05-02 NOTE — Progress Notes (Signed)
[employer] has denied LTAC, CSW will initiate bed search for vent SNF.

## 2013-05-02 NOTE — Progress Notes (Signed)
RT Note: Patient has been weaning all day on ATC with no complications. RT attempted to place PMV on patient several times today but he refused to have it placed on. He also was sleeping most of the day. Rt will continue to monitor.

## 2013-05-03 ENCOUNTER — Encounter: Payer: Medicare HMO | Admitting: Physician Assistant

## 2013-05-03 LAB — BLOOD GAS, ARTERIAL
Acid-Base Excess: 4.5 mmol/L — ABNORMAL HIGH (ref 0.0–2.0)
Bicarbonate: 28.3 mEq/L — ABNORMAL HIGH (ref 20.0–24.0)
Drawn by: 34767
FIO2: 30 %
O2 Saturation: 97.7 %
PCO2 ART: 41.1 mmHg (ref 35.0–45.0)
PH ART: 7.453 — AB (ref 7.350–7.450)
Patient temperature: 98.6
TCO2: 29.6 mmol/L (ref 0–100)
pO2, Arterial: 91.3 mmHg (ref 80.0–100.0)

## 2013-05-03 LAB — CBC
HCT: 29.2 % — ABNORMAL LOW (ref 39.0–52.0)
Hemoglobin: 8.5 g/dL — ABNORMAL LOW (ref 13.0–17.0)
MCH: 28.3 pg (ref 26.0–34.0)
MCHC: 29.1 g/dL — ABNORMAL LOW (ref 30.0–36.0)
MCV: 97.3 fL (ref 78.0–100.0)
PLATELETS: 161 10*3/uL (ref 150–400)
RBC: 3 MIL/uL — AB (ref 4.22–5.81)
RDW: 18.5 % — ABNORMAL HIGH (ref 11.5–15.5)
WBC: 8 10*3/uL (ref 4.0–10.5)

## 2013-05-03 LAB — BASIC METABOLIC PANEL
BUN: 27 mg/dL — ABNORMAL HIGH (ref 6–23)
CALCIUM: 8.8 mg/dL (ref 8.4–10.5)
CO2: 28 mEq/L (ref 19–32)
Chloride: 113 mEq/L — ABNORMAL HIGH (ref 96–112)
Creatinine, Ser: 0.74 mg/dL (ref 0.50–1.35)
GFR, EST NON AFRICAN AMERICAN: 87 mL/min — AB (ref >90)
Glucose, Bld: 172 mg/dL — ABNORMAL HIGH (ref 70–99)
Potassium: 3.6 mEq/L — ABNORMAL LOW (ref 3.7–5.3)
Sodium: 151 mEq/L — ABNORMAL HIGH (ref 137–147)

## 2013-05-03 LAB — GLUCOSE, CAPILLARY
GLUCOSE-CAPILLARY: 132 mg/dL — AB (ref 70–99)
GLUCOSE-CAPILLARY: 142 mg/dL — AB (ref 70–99)
GLUCOSE-CAPILLARY: 164 mg/dL — AB (ref 70–99)
Glucose-Capillary: 156 mg/dL — ABNORMAL HIGH (ref 70–99)
Glucose-Capillary: 155 mg/dL — ABNORMAL HIGH (ref 70–99)
Glucose-Capillary: 171 mg/dL — ABNORMAL HIGH (ref 70–99)

## 2013-05-03 MED ORDER — DEXTROSE 5 % IV SOLN
INTRAVENOUS | Status: DC
  Administered 2013-05-03: 50 mL via INTRAVENOUS

## 2013-05-03 NOTE — Progress Notes (Addendum)
Speech Language Pathology Treatment: Tim Short Speaking valve  Patient Details Name: Tim Short MRN: [id number] DOB: 09/19/1934 Today's Date: 05/03/2013 Time: 1110-1130 SLP Time Calculation (min): 20 min  Assessment / Plan / Recommendation Clinical Impression  Pt willing to place PMSV today. SLp deflated cuff without incident. Pt tolerated valve for 15-20 minutes with mildly decreased breath support, occasionally needed cues to attempt verbalizations rather than gestures, which is his habit.  He has no change in vital signs or evidence of breath stacking.  Pt continues to report mild discomfort with valve making him reluctant to wear it during all waking hours. He will allow placement when visitors and staff are in the room so he can communicate, but is not interested in independence. He also does not want to attempt POs or try swallow therapy. He is nauseated and food has no taste for him. Will discontinue swallowing therapy and defer attempts to next level of care. Pt may wear PMSV when caregivers are in room. SLP will f/u 1x a week for tolerance and attempts to encourage pt to progress. Discussed precautions with RN.    HPI HPI: Pt is 79 yo male with fairly [facility] for failed maize procedure and during that hospitalization pt developed volvulus and has required right colectomy (10/08/2012), subsequently developed multiple abscesses and enteric fistulas managed percutaneous drains. [facility], unable to wean of the vent and requiring trach placement 10/29/2012. He was transferred to [facility] in December 2014 and later to [facility] in late January 2015. He was on TNA and was doing fairly well initially, but developed more abdominal pain and on 2/23 taken to OR in [facility] for gallbladder removal (secondary to cholecystitis), resection for part of the colon for fistula, IVC filter placement. Sent back to [facility] on march 4th, 2015  and started on TPN. Diet advanced on March 9th, 2015 after pt passed swallow evaluation. [facility], after noticing progressive abdominal distension. In ED, CT abdomen with air- fluid level and findings consistent with early partial SBO, ileus. Surgery reports pt is ok for PO from their standpoint.  PMSV and BSE completed 3/20, MBS 3/21 and 3/30 - silent aspiration of nectar thick without chin tuck. D2/Nectar with chin tuck recommended.  PEG placed, pt returned to vent intermittently.   SLP following for PMSV tx/ po trials   Pertinent Vitals NA  SLP Plan  Continue with current plan of care    Recommendations Diet recommendations: NPO      Patient may use Passy-Short Speech Valve: Intermittently with supervision;During all therapies with supervision PMSV Supervision: Full       Oral Care Recommendations: Oral care Q4 per protocol Follow up Recommendations: Skilled Nursing facility;LTACH;24 hour supervision/assistance Plan: Continue with current plan of care    GO    [facility] LLC[facility]nie Keinan Brouillet, MA CCC-SLP [phone]  Tim Short 05/03/2013, 11:44 AM

## 2013-05-03 NOTE — Progress Notes (Signed)
Respiratory therapy note-Placed to 35% ATC at 0730, moderate amount white/pale yellow secretions. Continue to monitor.

## 2013-05-03 NOTE — Progress Notes (Signed)
PULMONARY / CRITICAL CARE MEDICINE   Name: Tim BarerJames B Short MRN: [id number] DOB: 10/21/1934    ADMISSION DATE:  03/28/2013 CONSULTATION DATE:  04/20/2013  REFERRING MD :  Dr. Vanessa BarbaraZamora PRIMARY SERVICE:  PCCM  CHIEF COMPLAINT:  AMS / Hypoxia   BRIEF PATIENT DESCRIPTION: 79 y/o with complicated, prolonged illness since 09/2012 with respiratory failure, tracheostomy, dysphagia, cholecystitis s/p cholecystectomy, colon fistula, IVC filter placement, SBO, AF with RVR.  4/8 patient decompensated on medical floor with AMS and hypoxia.  PCCM consulted for evaluation.    SIGNIFICANT EVENTS / STUDIES:  09/2012 - [facility], developed volvulus requiring R colectomy.  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 03/07/13 - Developed abd pain, distention at [facility].  [facility]bladder removal, resection of colon fistula, IVC filter placement 03/2013 - Returned back to [facility] 03/16/13, started on TPN.  Passed swallow eval 3/9 .............................................................................................................................................................................................................................................. 3/16 - Progressive abd pain / distension.  [facility] with early SBO/Ileus.  [facility] 04/08[facility]13 - SBO resolved.  Surgery s/o.  Developed Afib with RVR.  Cardioverted per Cardiology.  SR 3/28-3/29 with plan for d/c on 3/31 but went back into fib on 3/30 -on heparin gtt.  TPN continued.   4/08 - decompensated & Tx to ICU 4/12 - Weaning on PSV 10/5.  Awake and alert. Still has diarrhea with loose stool and mild diffused abdominal pain.  4/13 - GOC meeting planned with Palliative care; left on ATC 30 hours and  failed 4/14 - ATC 5 hours 4/15 - No acute events 4/20 will need vent snf 4/21 PT ordered.  LINES / TUBES: Trach (chronic)>>> LUE PICC 3/22>>>  CULTURES: Sputum 3/22>>> KLEBSIELLA BCx2 3/26>>>neg C-Diff 3/29>>>neg UC 4/2>>>neg C-Diff 4/4 >>> POSITIVE  ANTIBIOTICS: Flagyl 4/4 (Cdiff)>>>I  4/15>>4/20  SUBJECTIVE: Follows commands. Denies acute issues . Does not want PT.  VITAL SIGNS: Temp:  [98 F (36.7 C)-100.8 F (38.2 C)] 100.8 F (38.2 C) (04/21 0742) Pulse Rate:  [39-120] 86 (04/21 0742) Resp:  [14-32] 19 (04/21 0742) BP: (82-119)/(51-76) 111/75 mmHg (04/21 0742) SpO2:  [94 %-100 %] 96 % (04/21 0742) FiO2 (%):  [35 %] 35 % (04/21 0742) Weight:  [89 kg (196 lb 3.4 oz)] 89 kg (196 lb 3.4 oz) (04/21 0427)  VENTILATOR SETTINGS: Vent Mode:  [-] PRVC FiO2 (%):  [35 %] 35 % Set Rate:  [16 bmp] 16 bmp Vt Set:  [600 mL] 600 mL PEEP:  [5 cmH20] 5 cmH20 Plateau Pressure:  [17 cmH20-18 cmH20] 17 cmH20  INTAKE / OUTPUT: Intake/Output     04/20 0701 - 04/21 0700 04/21 0701 - 04/22 0700   I.V. (mL/kg) 180 (2)    Other 200    NG/GT 880    Total Intake(mL/kg) 1260 (14.2)    Urine (mL/kg/hr) 700 (0.3) 250 (1.4)   Total Output 700 250   Net +560 -250        Stool Occurrence 5 x      PHYSICAL EXAMINATION: General:  Resting comfortably ont-collar. Neuro intact HEENT: Trach site c/d/i. PMV not in place PULM: diminished in bases , congested cough CV: Irreg irreg, normal rate AB: BS+, soft, soft, very mild diffuse tenderness, PEG site normal Ext: warm, notable dependent edema. Heel pad dressing Neuro: Awake, alert, interactive, able to smile. Shakes  head no to offer of pt.  LABS:  CBC  Recent Labs Lab 04/28/13 0515 05/03/13 0537  WBC 7.7 8.0  HGB 8.0* 8.5*  HCT 27.1* 29.2*  PLT 179 161   Coag's No results found for this basename: APTT, INR,  in the last 168 hours BMET  Recent Labs Lab 04/29/13 0435 04/30/13 0530 05/03/13 0537  NA 144 148* 151*  K 3.9  4.3 3.6*  CL 104 108 113*  CO2 29 28 28   BUN 25* 30* 27*  CREATININE 0.89 0.84 0.74  GLUCOSE 182* 147* 172*   Electrolytes  Recent Labs Lab 04/29/13 0435 04/30/13 0530 05/03/13 0537  CALCIUM 8.6 8.9 8.8   Liver Enzymes No results found for this basename: AST, ALT, ALKPHOS, BILITOT, ALBUMIN,  in the last 168 hours Glucose  Recent Labs Lab 05/02/13 1141 05/02/13 1700 05/02/13 2011 05/02/13 2347 05/03/13 0417 05/03/13 0751  GLUCAP 129* 154* 178* 142* 156* 132*   IMAGING:  No results found.    ASSESSMENT / PLAN:  PULMONARY A: Acute on chronic respiratory failure - no clear lung problem; limited by weakness and pulm edema Chronic Tracheostomy, OSA  Stable L effusion - small COPD? No acute bronchospasm  P:   - ATC during day as tolerated, vent QHS, rate of 16 and check abg 4/21 one hour on vent -pmv need to increase this, will educate pt - dispo LTAC ideal - Xopenex Q6 PRN Will review record to see if we can avoid nocturnal vent in future he has progressed well during day  CARDIOVASCULAR A:  AF / RVR rate controlled HTN HLD CAD, s/p cath with PCI Pulm edema (preserved LVEF) > clarified with son, furosemide did NOT cause ototoxicity; IV furosemide OK per son P:  - Continue Amiodarone, Cardizem, - Continue metoprolol per peg - Continue Lipitor. - PRN Lopressor for HR >110. - cont Xarelto for hx of PE and dvt proph  RENAL A:   Hypokalemia resolved Hypernatremia  Recent Labs Lab 04/29/13 0435 04/30/13 0530 05/03/13 0537  NA 144 148* 151*    P:   - Trend BMP daily -check labs on 4/22 -4/21 ad D5w  GASTROINTESTINAL A:   Dysphagia GERD C-Diff Pseudomembranous colitis Chronic Abdominal pain> normal belly exam, related to tube feeds? P:   - simethicone - ask dietary to reassess tube feeds> contributing to cramping? - check LFT's - swallow eval once on ATC >48 hours  HEMATOLOGIC A:   Chronic anemia- Hg 8 P:  - Trend  CBC  INFECTIOUS A:   Pseudomembranous colitis P:   - cont Flagyl  Through 4/18 (convert to per tube 4/15) dc 4/20 Has received over 2 weeks If reoccurs to oral vanc Limit empiric abx  ENDOCRINE A:   DM II P:   - SSI,  q4h  NEUROLOGIC A:   Acute encephalopathy > resolved P:   - Morphine PRN - Mobilize, PT efforts as able > educated him that he needs to participate with PT more   GLOBAL: Ongoing plan- -Goal is [facility] . [facility] -Palliative care involved in his care at pt request Son is pushing aggressive care and patient wants more comfort mode.  Brett CanalesSteve Minor ACNP [facility] PCCM Pager [phone] till 3 pm If no answer page [phone] 05/03/2013, 9:03 AM   I have fully examined this patient and agree with above findings.    And edite din full

## 2013-05-03 NOTE — Progress Notes (Signed)
PT Cancellation Note  Patient Details Name: Tim Short MRN: [id number] DOB: 04/24/1934   Cancelled Treatment:    Reason Eval Not Completed: Son would like to be present for PT evaluation. Scheduled to meet son, Onalee HuaDavid, 4/22 @ 8:30 am in patient's room.   Scherrie NovemberLynn P Oneda Duffett 05/03/2013, 2:49 PM Pager [phone]

## 2013-05-03 NOTE — Progress Notes (Addendum)
[facility] awaiting insurance authorization; clinically, pt has been accepted and they can take him back once insurance approves. Referral [facility] [place].  Addendum: [facility] at this [facility].  Maryclare LabradorJulie Babygirl Trager, MSW, [facility] Clinical Social Worker [phone]

## 2013-05-03 NOTE — Progress Notes (Signed)
Vent SNFs need PT/OT notes to send for insurance auth. CSW noted PT to see pt tomorrow morning at 8:30am. Will send PT/OT notes to facilities.   Maryclare LabradorJulie Marisue Canion, MSW, [facility] Clinical Social Worker [phone]

## 2013-05-04 ENCOUNTER — Inpatient Hospital Stay (HOSPITAL_COMMUNITY): Payer: Medicare HMO

## 2013-05-04 LAB — BASIC METABOLIC PANEL
BUN: 26 mg/dL — AB (ref 6–23)
CO2: 26 mEq/L (ref 19–32)
Calcium: 8.5 mg/dL (ref 8.4–10.5)
Chloride: 107 mEq/L (ref 96–112)
Creatinine, Ser: 0.79 mg/dL (ref 0.50–1.35)
GFR calc Af Amer: >90 mL/min (ref >90)
GFR, EST NON AFRICAN AMERICAN: 84 mL/min — AB (ref >90)
Glucose, Bld: 175 mg/dL — ABNORMAL HIGH (ref 70–99)
Potassium: 3.4 mEq/L — ABNORMAL LOW (ref 3.7–5.3)
Sodium: 146 mEq/L (ref 137–147)

## 2013-05-04 LAB — CBC WITH DIFFERENTIAL/PLATELET
Basophils Absolute: 0 10*3/uL (ref 0.0–0.1)
Basophils Relative: 0 % (ref 0–1)
Eosinophils Absolute: 0 10*3/uL (ref 0.0–0.7)
Eosinophils Relative: 1 % (ref 0–5)
HCT: 27.9 % — ABNORMAL LOW (ref 39.0–52.0)
Hemoglobin: 8 g/dL — ABNORMAL LOW (ref 13.0–17.0)
Lymphocytes Relative: 27 % (ref 12–46)
Lymphs Abs: 2.4 10*3/uL (ref 0.7–4.0)
MCH: 28.1 pg (ref 26.0–34.0)
MCHC: 28.7 g/dL — AB (ref 30.0–36.0)
MCV: 97.9 fL (ref 78.0–100.0)
Monocytes Absolute: 0.7 10*3/uL (ref 0.1–1.0)
Monocytes Relative: 8 % (ref 3–12)
NEUTROS PCT: 64 % (ref 43–77)
Neutro Abs: 5.7 10*3/uL (ref 1.7–7.7)
PLATELETS: 181 10*3/uL (ref 150–400)
RBC: 2.85 MIL/uL — ABNORMAL LOW (ref 4.22–5.81)
RDW: 18.1 % — ABNORMAL HIGH (ref 11.5–15.5)
WBC: 8.8 10*3/uL (ref 4.0–10.5)

## 2013-05-04 LAB — GLUCOSE, CAPILLARY
GLUCOSE-CAPILLARY: 156 mg/dL — AB (ref 70–99)
GLUCOSE-CAPILLARY: 163 mg/dL — AB (ref 70–99)
GLUCOSE-CAPILLARY: 167 mg/dL — AB (ref 70–99)
Glucose-Capillary: 135 mg/dL — ABNORMAL HIGH (ref 70–99)

## 2013-05-04 MED ORDER — DEXTROSE 5 % IV SOLN: 50.0000 mL | INTRAVENOUS | Status: AC

## 2013-05-04 MED ORDER — DILTIAZEM HCL 120 MG PO TABS
120.0000 mg | ORAL_TABLET | Freq: Three times a day (TID) | ORAL | Status: AC
Start: 2013-05-04 — End: ?

## 2013-05-04 MED ORDER — AMIODARONE HCL 200 MG PO TABS: 200.0000 mg | ORAL_TABLET | Freq: Two times a day (BID) | ORAL | Status: AC

## 2013-05-04 MED ORDER — INSULIN ASPART 100 UNIT/ML ~~LOC~~ SOLN: 0.0000 [IU] | SUBCUTANEOUS | Status: AC

## 2013-05-04 MED ORDER — ONDANSETRON HCL 4 MG/2ML IJ SOLN: 4.0000 mg | Freq: Four times a day (QID) | INTRAMUSCULAR | Status: AC | PRN

## 2013-05-04 MED ORDER — ACETAMINOPHEN 160 MG/5ML PO SOLN: 500.0000 mg | Freq: Four times a day (QID) | ORAL | Status: AC | PRN

## 2013-05-04 MED ORDER — OSMOLITE 1.5 CAL PO LIQD: 1000.0000 mL | ORAL | Status: AC

## 2013-05-04 MED ORDER — NAPHAZOLINE HCL 0.1 % OP SOLN: 1.0000 [drp] | Freq: Four times a day (QID) | OPHTHALMIC | Status: AC | PRN

## 2013-05-04 MED ORDER — PRO-STAT SUGAR FREE PO LIQD: 30.0000 mL | Freq: Two times a day (BID) | ORAL | Status: AC

## 2013-05-04 MED ORDER — RIVAROXABAN 20 MG PO TABS: 20.0000 mg | ORAL_TABLET | Freq: Every day | ORAL | Status: AC

## 2013-05-04 MED ORDER — POTASSIUM CHLORIDE 20 MEQ/15ML (10%) PO LIQD
40.0000 meq | Freq: Once | ORAL | Status: AC
  Administered 2013-05-04: 40 meq
  Filled 2013-05-04: qty 30

## 2013-05-04 MED ORDER — METOPROLOL TARTRATE 1 MG/ML IV SOLN: 10.0000 mg | INTRAVENOUS | Status: AC | PRN

## 2013-05-04 MED ORDER — LEVALBUTEROL HCL 0.63 MG/3ML IN NEBU: 0.6300 mg | INHALATION_SOLUTION | Freq: Four times a day (QID) | RESPIRATORY_TRACT | Status: AC | PRN

## 2013-05-04 MED ORDER — METOPROLOL TARTRATE 25 MG PO TABS: 25.0000 mg | ORAL_TABLET | Freq: Two times a day (BID) | ORAL | Status: AC

## 2013-05-04 MED ORDER — SIMETHICONE 40 MG/0.6ML PO SUSP: 40.0000 mg | Freq: Four times a day (QID) | ORAL | Status: AC | PRN

## 2013-05-04 NOTE — Progress Notes (Signed)
Pt called wants his passey muir  valve off - refuses lights on or TV to stay awake - blinds open wants to nap. Valve taken off for nap.

## 2013-05-04 NOTE — Evaluation (Signed)
Physical Therapy Evaluation Patient Details Name: Tim Short MRN: [id number] DOB: 03/10/1934 Today's Date: 05/04/2013   History of Present Illness  [age] yo male with [facility] at [facility] after an [facility] for afib in oct 2014 he developed a volvulus after the ablation was intubated/trached required multiple abd surgeries then developed fistula details unknown. Has been on tpn, with ngt on/off since. [facility] at [facility] until 03/28/13 adm for abd pain/ileus. Continued respiratory failure with vent dependence. 4/10 G-tube placement. in/out of afib. Pallative care meeting 04/25/13 for goals of care. Pt has waxed and waned re: aggressive vs comfort care. Son, Onalee HuaDavid, has favored aggresive care.  PMHx (additiional)- Meniere's disease (1970's-reports meclizine helps), DM, wrist fx, TKR  Clinical Impression  Pt admitted with multiple medical problems including vent dependence (see above). Pt now interested in resuming therapy and feels he would benefit from taking meclizine for his Meniere's disease (as he has done in the past). Pt currently with functional limitations due to the deficits listed below (see PT Problem List).  Pt will benefit from skilled PT to increase their independence and safety with mobility to allow discharge to the venue listed below.       Follow Up Recommendations SNF;Supervision/Assistance - 24 hour    Equipment Recommendations  None recommended by PT    Recommendations for Other Services       Precautions / Restrictions Precautions Precautions: Fall      Mobility  Bed Mobility Overal bed mobility: Needs Assistance;+2 for physical assistance;+ 2 for safety/equipment Bed Mobility: Supine to Sit;Sit to Supine     Supine to sit: Mod assist;+2 for physical assistance;HOB elevated Sit to supine: Min assist;+2 for safety/equipment;HOB elevated   General bed mobility comments: to minimize vertigo (due to meniere's), HOB fully elevated and assisted pt to  rotate/turn to sit EOB; on return to supine, pt anxious due to vertigo and performed most of the action himself (slight assist to control trunk)  Transfers                 General transfer comment: pt adamant he could not attempt today  Ambulation/Gait                Stairs            Wheelchair Mobility    Modified Rankin (Stroke Patients Only)       Balance Overall balance assessment: Needs assistance Sitting-balance support: Bilateral upper extremity supported;Feet unsupported Sitting balance-[name] Scale: Poor Sitting balance - Comments: EOB 3 minutes with minguard assist (pt using bil UE support); pt reported vertigo and increasing nausea that was worsening and returned to supine                                     Pertinent Vitals/Pain On PRVC, RR 22-28 SaO2 97-99% with FiO2 35% At end of session, RT in to place pt on trach collar and able to use his PMV to speak to us Noted pain due to rectal tube with Lt leg ROM and scooting up in bed; patient repositioned for comfort     Home Living Family/patient expects to be discharged to:: Skilled nursing facility                 Additional Comments: hospitalized acutely and LTACH since 09/2012    Prior Function Level of Independence: Needs assistance         Comments:  declining function over course of prolonged illness      Hand Dominance   Dominant Hand: Right    Extremity/Trunk Assessment   Upper Extremity Assessment: Overall WFL for tasks assessed (bil elbow 4/5; shoulder AROM WFL)           Lower Extremity Assessment: LLE deficits/detail;RLE deficits/detail RLE Deficits / Details: knee flexion limited to 90; no active dorsiflexion; combined hip/knee extension (supine) 3+/5, plantarflexion 4/5 LLE Deficits / Details: AROM WFL, combined hip/knee extension (supine) 3+/5, plantarflexion/dorsiflexion 4/5  Cervical / Trunk Assessment: Kyphotic;Other exceptions   Communication   Communication: Passy-Muir valve;HOH (requires encouragement to use PMV; hears on Rt best)  Cognition Arousal/Alertness: Awake/alert Behavior During Therapy: WFL for tasks assessed/performed Overall Cognitive Status: Within Functional Limits for tasks assessed                      General Comments General comments (skin integrity, edema, etc.): Son, Onalee HuaDavid, had requested to be present for PT evaluation. Pt was agreeable to participate with evaluation with stipulation that he not try to get OOB to chair today. After activity completed, discussed short and longer-term goals including upright sitting and OOB/transfers with goal of use of wheelchair and ability to move around environment. Pt agreeable.     Exercises        Assessment/Plan    PT Assessment Patient needs continued PT services  PT Diagnosis Generalized weakness   PT Problem List Decreased strength;Decreased range of motion;Decreased activity tolerance;Decreased balance;Decreased mobility;Cardiopulmonary status limiting activity;Obesity  PT Treatment Interventions Functional mobility training;Therapeutic activities;Therapeutic exercise;Balance training;Patient/family education   PT Goals (Current goals can be found in the Care Plan section) Acute Rehab PT Goals Patient Stated Goal: to get stronger (and agreed to overall PT goals) PT Goal Formulation: With patient Time For Goal Achievement: 05/18/13 Potential to Achieve Goals: Fair    Frequency Min 2X/week   Barriers to discharge Decreased caregiver support;Other (comment) (vent dependent)      Co-evaluation               End of Session Equipment Utilized During Treatment: Other (comment) (pt on rest mode of vent) Activity Tolerance: Treatment limited secondary to medical complications (Comment) (vertigo) Patient left: in bed;with call bell/phone within reach;with family/visitor present Nurse Communication: Other (comment) (limited by  vertigo; ?has meclizine ordered)         Time: [id number] PT Time Calculation (min): 42 min   Charges:   PT Evaluation $Initial PT Evaluation Tier I: 1 Procedure PT Treatments $Therapeutic Activity: 23-37 mins   PT G CodesScherrie November:          Anterrio Mccleery P Laynie Espy 05/04/2013, 10:34 AM Pager [phone]

## 2013-05-04 NOTE — Progress Notes (Addendum)
Discharge summary sent to facility. Transport scheduled, packet placed on chart.  Addendum: CSW informed by RN that pt refused transport. Called pt's son, and son authorized SNF discharge. CSW spoke with pt, explaining son will meet him at facility and has authorized discharge. Director of unit also present for conversation. Pt states he will go to [facility], and understands if he wants to transfer to another facility/explore other options to follow up with facility and son. [facility] picked up pt at 4pm. CSW signing off.  Maryclare LabradorJulie Aikam Vinje, MSW, [facility]WA Clinical Social Worker [phone]

## 2013-05-04 NOTE — Progress Notes (Signed)
[facility] - [facility].

## 2013-05-04 NOTE — Progress Notes (Signed)
PULMONARY / CRITICAL CARE MEDICINE   Name: Tim Short MRN: [id number] DOB: [birth year]    ADMISSION DATE:  03/28/2013 CONSULTATION DATE:  04/20/2013  REFERRING MD :  Dr. Coralyn Pear PRIMARY SERVICE:  PCCM  CHIEF COMPLAINT:  AMS / Hypoxia   BRIEF PATIENT DESCRIPTION: 79 y/o with complicated, prolonged illness since 09/2012 with respiratory failure, tracheostomy, dysphagia, cholecystitis s/p cholecystectomy, colon fistula, IVC filter placement, SBO, AF with RVR.  4/8 patient decompensated on medical floor with AMS and hypoxia.  PCCM consulted for evaluation.    SIGNIFICANT EVENTS / STUDIES:  09/2012 - Admit at [facility] for failed MAIZE procedure, developed volvulus requiring R colectomy.  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 03/07/13 - Developed abd pain, distention at [facility].  Taken to OR in [facility] for gallbladder removal, resection of colon fistula, IVC filter placement 03/2013 - Returned back to [facility] 03/16/13, started on TPN.  Passed swallow eval 3/9 .............................................................................................................................................................................................................................................. 3/16 - Progressive abd pain / distension.  Tx to [facility] with CT abdomen with early SBO/Ileus.  Admitted by [facility]. 04/08/13 - SBO resolved.  Surgery s/o.  Developed Afib with RVR.  Cardioverted per Cardiology.  SR 3/28-3/29 with plan for d/c on 3/31 but went back into fib on 3/30 -on heparin gtt.  TPN continued.   4/08 - decompensated & Tx to ICU 4/12 - Weaning on PSV 10/5.  Awake and alert. Still has diarrhea with loose stool and mild diffused abdominal pain.  4/13 - [place] meeting planned with Palliative care; left on ATC 30 hours and  failed 4/14 - ATC 5 hours 4/15 - No acute events 4/20 will need vent snf 4/21 PT ordered. 4/22 seen by pt and recommendations noted.     LINES / TUBES: Trach (chronic)>>> LUE PICC 3/22>>>  CULTURES: Sputum 3/22>>> KLEBSIELLA BCx2 3/26>>>neg C-Diff 3/29>>>neg UC 4/2>>>neg C-Diff 4/4 >>> POSITIVE  ANTIBIOTICS: Flagyl 4/4 (Cdiff)>>>I  4/15>>4/20  SUBJECTIVE: Follows commands. Denies acute issues . Not very engaged with PT requests.  VITAL SIGNS: Temp:  [98 F (36.7 C)-98.4 F (36.9 C)] 98 F (36.7 C) (04/22 1132) Pulse Rate:  [55-127] 55 (04/22 1132) Resp:  [14-30] 23 (04/22 1132) BP: (87-112)/(50-71) 98/58 mmHg (04/22 1228) SpO2:  [86 %-99 %] 94 % (04/22 1132) FiO2 (%):  [28 %-35 %] 30 % (04/22 1132) Weight:  [89 kg (196 lb 3.4 oz)] 89 kg (196 lb 3.4 oz) (04/22 0451)  VENTILATOR SETTINGS: Vent Mode:  [-] PRVC FiO2 (%):  [28 %-35 %] 30 % Set Rate:  [16 bmp] 16 bmp Vt Set:  [600 mL] 600 mL PEEP:  [5 cmH20] 5 cmH20 Plateau Pressure:  [19 cmH20] 19 cmH20  INTAKE / OUTPUT: Intake/Output     04/21 0701 - 04/22 0700 04/22 0701 - 04/23 0700   I.V. (mL/kg) 1170 (13.1) 210 (2.4)   Other 270    NG/GT 660    Total Intake(mL/kg) 2100 (23.6) 210 (2.4)   Urine (mL/kg/hr) 800 (0.4) 120 (0.2)   Total Output 800 120   Net +1300 +90          PHYSICAL EXAMINATION: General:  Resting comfortably ont-collar. Neuro intact. Shakes head to questions HEENT: Trach site c/d/i. PMV not in place PULM: diminished in bases , congested cough CV: Irreg irreg, normal rate AB: BS+, soft, soft, very mild diffuse tenderness, PEG site normal Ext: warm, notable dependent  edema. Heel pad dressing Neuro: Awake, alert, interactive, able to smile. Shakes yes or no.  LABS:  CBC  Recent Labs Lab 04/28/13 0515 05/03/13 0537 05/04/13 0500  WBC 7.7 8.0 8.8  HGB 8.0* 8.5* 8.0*  HCT 27.1* 29.2* 27.9*  PLT 179 161 181   Coag's No results found for this basename: APTT, INR,  in the last 168  hours BMET  Recent Labs Lab 04/30/13 0530 05/03/13 0537 05/04/13 0500  NA 148* 151* 146  K 4.3 3.6* 3.4*  CL 108 113* 107  CO2 28 28 26   BUN 30* 27* 26*  CREATININE 0.84 0.74 0.79  GLUCOSE 147* 172* 175*   Electrolytes  Recent Labs Lab 04/30/13 0530 05/03/13 0537 05/04/13 0500  CALCIUM 8.9 8.8 8.5   Liver Enzymes No results found for this basename: AST, ALT, ALKPHOS, BILITOT, ALBUMIN,  in the last 168 hours Glucose  Recent Labs Lab 05/03/13 1142 05/03/13 1625 05/03/13 1940 05/04/13 0002 05/04/13 0450 05/04/13 0810  GLUCAP 164* 155* 171* 135* 156* 163*   IMAGING:  Dg Chest Port 1 View  05/04/2013   CLINICAL DATA:  Assess atelectasis, shortness of breath.  EXAM: PORTABLE CHEST - 1 VIEW  COMPARISON:  DG CHEST 1V PORT dated 04/28/2013 The cardiac silhouette remains mild to moderately enlarged.  FINDINGS: Prominent aortic knob is unchanged with mild calcific atherosclerosis. Mild central pulmonary vasculature congestion. Improved aeration lung bases with residual retrocardiac airspace opacity and small left pleural effusion. Minimal right lower lobe atelectasis.  Tracheostomy tube tip projects 4.8 cm above the carina. Left PICC in place with distal tip projecting at brachiocephalic confluence. No pneumothorax.  Multiple EKG lines overlie the patient and may obscure subtle underlying pathology. Surgical clips project in left abdomen.  IMPRESSION: Stable cardiomegaly, central pulmonary vasculature congestion with somewhat improved aeration lung bases, persistent retrocardiac consolidation and small left pleural effusion. Minimal right lung base atelectasis.   Electronically Signed   By: Elon Alas   On: 05/04/2013 06:28   ABG    Component Value Date/Time   PHART 7.453* 05/03/2013 2300   PCO2ART 41.1 05/03/2013 2300   PO2ART 91.3 05/03/2013 2300   HCO3 28.3* 05/03/2013 2300   TCO2 29.6 05/03/2013 2300   O2SAT 97.7 05/03/2013 2300      ASSESSMENT /  PLAN:  PULMONARY A: Acute on chronic respiratory failure - no clear lung problem; limited by weakness and pulm edema Chronic Tracheostomy, OSA  Stable L effusion - small COPD? No acute bronchospasm  P:   - ATC during day as tolerated, vent QHS, rate of 16 and check abg still needed as 4/21 Abg was on trach collar -need abg on vent to ensure not alk -pmv need to increase this as tolerated - dispo LTAC ideal - Xopenex Q6 PRN -Consider trach collar as tolerated 4/22 -PMV escalate  CARDIOVASCULAR A:  AF / RVR rate controlled HTN HLD CAD, s/p cath with PCI Pulm edema (preserved LVEF) > clarified with son, furosemide did NOT cause ototoxicity; IV furosemide OK per son P:  - Continue Amiodarone, Cardizem, - Continue metoprolol per peg - Continue Lipitor. - PRN Lopressor for HR >110. - cont Xarelto for hx of PE and dvt proph  RENAL A:   Hypokalemia  Hypernatremia resolving  Recent Labs Lab 04/30/13 0530 05/03/13 0537 05/04/13 0500  NA 148* 151* 146    Recent Labs Lab 04/30/13 0530 05/03/13 0537 05/04/13 0500  K 4.3 3.6* 3.4*    P:   - Trend BMP daily -  check labs on 4/22 -4/21 added  D5w -4/22 replete K+ Continued d5w  GASTROINTESTINAL A:   Dysphagia GERD C-Diff Pseudomembranous colitis Chronic Abdominal pain> normal belly exam, related to tube feeds? P:   - simethicone - swallow eval once on ATC >48 hours  HEMATOLOGIC A:   Chronic anemia- Hg 8.8 P:  - Trend CBC  INFECTIOUS A:   Pseudomembranous colitis P:   - cont Flagyl  Through 4/18 (convert to per tube 4/15) dc 4/20 Has received over 2 weeks If reoccurs to oral vanc Limit empiric abx  ENDOCRINE A:   DM II P:   - SSI,  q4h  NEUROLOGIC A:   Acute encephalopathy > resolved P:   - Morphine PRN - Mobilize, PT efforts as able > educated him that he needs to participate with PT more   GLOBAL: Ongoing plan- -Goal is transfer to long term care . [facility] not an option -Palliative care  involved in his care at pt request Son is pushing aggressive care and patient wants more comfort mode. 4/22 consider t collar as tolerated  Richardson Landry Minor ACNP [facility] PCCM Pager [phone] till 3 pm If no answer page [phone] 05/04/2013, 12:44 PM  I have fully examined this patient and agree with above findings.     Lavon Paganini. Titus Mould, MD, [place] Pgr: [facility]

## 2013-05-04 NOTE — Progress Notes (Signed)
Pt discharged per ambulance to [facility] with all belongings. Called son to make aware of transfer.

## 2013-05-04 NOTE — Progress Notes (Signed)
Pt discharging to [facility] vent SNF today. RN informed. Transport scheduled for ~3pm with [facility] (dispatch: [phone]).   Maryclare LabradorJulie Keishla Oyer, MSW, [facility]WA Clinical Social Worker [phone]

## 2013-05-04 NOTE — Progress Notes (Signed)
Pt has Standard PacificPassey muir valve on speaking well.

## 2013-05-04 NOTE — Discharge Summary (Signed)
Physician Discharge Summary  Patient ID: Tim Short MRN: [id number] DOB/AGE: [birth year] 79 y.o.  Admit date: 03/28/2013 Discharge date: 05/04/2013  Problem List Principal Problem:   Acute on chronic respiratory failure Active Problems:   Atrial fibrillation   Essential hypertension   CAD (coronary artery disease)   Acute and chronic respiratory failure   Tracheostomy status   Ileus   Abdominal pain   Hypernatremia   Chronic respiratory failure   C. difficile colitis   Palliative care encounter   Weakness generalized  HPI:  79 yo male with recent [facility] at [facility] after an ablation for afib in oct 2014 he developed a volvulus after the ablation was intubated/trachedm required multiple abd surgeries then developed fistula details unknown. Has been on tpn, with ngt on/off since. Is at [facility] sent here for complaints of abd pain. Pt was taken off tpn and feeding tube was removed last week. He has been eating by mouth and doing well. But last 4 days with worsening abd pain, no n/v/d. No fevers (however is was started on vanc and zosyn for unknown reasons). Pt na level is elevated, and appears dehydrated. Pt appears comfortable.  Hospital Course: SIGNIFICANT EVENTS / STUDIES:  09/2012 - Admit at [facility] for failed MAIZE procedure, developed volvulus requiring R colectomy. 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  03/07/13 - Developed abd pain, distention at [facility]. Taken to OR in [facility] for gallbladder removal, resection of colon fistula, IVC filter placement  03/2013 - Returned back to [facility] 03/16/13, started on TPN. Passed swallow eval 3/9   ..............................................................................................................................................................................................................................................  3/16 - Progressive abd pain / distension. Tx to [facility] with CT abdomen with early SBO/Ileus. Admitted by [facility]. 04/08/13 - SBO resolved. Surgery s/o. Developed Afib with RVR. Cardioverted per Cardiology. SR 3/28-3/29 with plan for d/c on 3/31 but went back into fib on 3/30 -on heparin gtt. TPN continued.  4/08 - decompensated & Tx to ICU  4/12 - Weaning on PSV 10/5. Awake and alert. Still has diarrhea with loose stool and mild diffused abdominal pain.  4/13 - [place] meeting planned with Palliative care; left on ATC 30 hours and failed  4/14 - ATC 5 hours  4/15 - No acute events  4/20 will need vent snf  4/21 PT ordered.  4/22 seen by pt and recommendations noted.  LINES / TUBES:  Trach (chronic)>>>  LUE PICC 3/22>>>  CULTURES:  Sputum 3/22>>> KLEBSIELLA  BCx2 3/26>>>neg  C-Diff 3/29>>>neg  UC 4/2>>>neg  C-Diff 4/4 >>> POSITIVE  ANTIBIOTICS:  Flagyl 4/4 (Cdiff)>>>I 4/15>>4/20  SUBJECTIVE: Follows commands. Denies acute issues . Not very engaged with PT requests.   PHYSICAL EXAMINATION:  General: Resting comfortably ont-collar. Neuro intact. Shakes head to questions  HEENT: Trach site c/d/i. PMV not in place  PULM: diminished in bases , congested cough  CV: Irreg irreg, normal rate  AB: BS+, soft, soft, very mild diffuse tenderness, PEG site normal  Ext: warm, notable dependent edema. Heel pad dressing  Neuro: Awake, alert, interactive, able to smile. Shakes yes or no.  ASSESSMENT / PLAN:  PULMONARY  A:  Acute on chronic respiratory failure - no clear lung problem; limited by weakness and pulm edema  Chronic Tracheostomy, OSA  Stable L effusion - small  COPD? No acute bronchospasm  P:  - ATC during day as tolerated, vent  QHS, rate of 16 and  check abg still needed as 4/21 Abg was on trach collar  -need abg on vent to ensure not alk  -pmv need to increase this as tolerated  - dispo LTAC ideal  - Xopenex Q6 PRN  -Consider trach collar as tolerated 4/22  -PMV escalate  CARDIOVASCULAR  A:  AF / RVR rate controlled  HTN  HLD  CAD, s/p cath with PCI  Pulm edema (preserved LVEF) > clarified with son, furosemide did NOT cause ototoxicity; IV furosemide OK per son  P:  - Continue Amiodarone, Cardizem,  - Continue metoprolol per peg  - Continue Lipitor.  - PRN Lopressor for HR >110.  - cont Xarelto for hx of PE and dvt proph  RENAL  A:  Hypokalemia  Hypernatremia resolving   Recent Labs  Lab  04/30/13 0530  05/03/13 0537  05/04/13 0500   NA  148*  151*  146     Recent Labs  Lab  04/30/13 0530  05/03/13 0537  05/04/13 0500   K  4.3  3.6*  3.4*    P:  - Trend BMP daily  -check labs on 4/22  -4/21 added D5w  -4/22 replete K+  Continued d5w for now GASTROINTESTINAL  A:  Dysphagia  GERD  C-Diff Pseudomembranous colitis  Chronic Abdominal pain> normal belly exam, related to tube feeds?  P:  - simethicone  - swallow eval once on ATC >48 hours  HEMATOLOGIC  A:  Chronic anemia- Hg 8.8  P:  - Trend CBC  INFECTIOUS  A:  Pseudomembranous colitis  P:  - cont Flagyl Through 4/18 (convert to per tube 4/15) dc 4/20  Has received over 2 weeks  If reoccurs to oral vanc  Limit empiric abx  ENDOCRINE  A:  DM II  P:  - SSI, q4h  NEUROLOGIC  A:  Acute encephalopathy > resolved  P:  - Morphine PRN  - Mobilize, PT efforts as able > educated him that he needs to participate with PT more  GLOBAL: Ongoing plan- -Goal is transfer to long term care . [facility] not an option  -Palliative care involved in his care at pt request  Son is pushing aggressive care and patient wants more comfort mode.  4/22 consider t collar as tolerated. He is being transferred to [facility] 4/22.     Labs at discharge Lab Results   Component Value Date   CREATININE 0.79 05/04/2013   BUN 26* 05/04/2013   NA 146 05/04/2013   K 3.4* 05/04/2013   CL 107 05/04/2013   CO2 26 05/04/2013   Lab Results  Component Value Date   WBC 8.8 05/04/2013   HGB 8.0* 05/04/2013   HCT 27.9* 05/04/2013   MCV 97.9 05/04/2013   PLT 181 05/04/2013   Lab Results  Component Value Date   ALT 26 04/21/2013   AST 19 04/21/2013   ALKPHOS 66 04/21/2013   BILITOT 0.3 04/21/2013   Lab Results  Component Value Date   INR 1.07 04/19/2013   INR 1.13 04/16/2013   INR 1.14 04/11/2013    Current radiology studies Dg Chest Port 1 View  05/04/2013   CLINICAL DATA:  Assess atelectasis, shortness of breath.  EXAM: PORTABLE CHEST - 1 VIEW  COMPARISON:  DG CHEST 1V PORT dated 04/28/2013 The cardiac silhouette remains mild to moderately enlarged.  FINDINGS: Prominent aortic knob is unchanged with mild calcific atherosclerosis. Mild central pulmonary vasculature congestion. Improved aeration lung bases with residual retrocardiac airspace  opacity and small left pleural effusion. Minimal right lower lobe atelectasis.  Tracheostomy tube tip projects 4.8 cm above the carina. Left PICC in place with distal tip projecting at brachiocephalic confluence. No pneumothorax.  Multiple EKG lines overlie the patient and may obscure subtle underlying pathology. Surgical clips project in left abdomen.  IMPRESSION: Stable cardiomegaly, central pulmonary vasculature congestion with somewhat improved aeration lung bases, persistent retrocardiac consolidation and small left pleural effusion. Minimal right lung base atelectasis.   Electronically Signed   By: Elon Alas   On: 05/04/2013 06:28    Disposition:  03-Skilled Nursing Facility  Discharge Orders   Future Appointments Provider Department Dept Phone   06/07/2013 3:30 PM Jettie Booze, MD [facility] [phone]   Future Orders Complete By Expires   Discharge to SNF when bed available  As directed         Medication List    STOP taking these medications       acetaminophen 500 MG tablet  Commonly known as:  TYLENOL  Replaced by:  acetaminophen 160 MG/5ML solution     albuterol 108 (90 BASE) MCG/ACT inhaler  Commonly known as:  PROVENTIL HFA;VENTOLIN HFA     dabigatran 150 MG Caps capsule  Commonly known as:  PRADAXA     diltiazem 240 MG 24 hr capsule  Commonly known as:  DILACOR XR  Replaced by:  diltiazem 120 MG tablet     HYPERSAL 3.5 % Nebu  Generic drug:  Sodium Chloride     insulin regular 100 units/mL injection  Commonly known as:  NOVOLIN R,HUMULIN R     ipratropium-albuterol 0.5-2.5 (3) MG/3ML Soln  Commonly known as:  DUONEB     isosorbide mononitrate 30 MG 24 hr tablet  Commonly known as:  IMDUR     lisinopril 5 MG tablet  Commonly known as:  PRINIVIL,ZESTRIL     meclizine 25 MG tablet  Commonly known as:  ANTIVERT     oxycodone 5 MG capsule  Commonly known as:  OXY-IR     potassium chloride SA 20 MEQ tablet  Commonly known as:  K-DUR,KLOR-CON     predniSONE 10 MG tablet  Commonly known as:  DELTASONE     PROTONIX 40 mg/20 mL Pack  Generic drug:  pantoprazole sodium     roflumilast 500 MCG Tabs tablet  Commonly known as:  DALIRESP     sitaGLIPtin 100 MG tablet  Commonly known as:  JANUVIA     sodium chloride 0.9 % SOLN 500 mL with vancomycin 10 G SOLR 1,500 mg     vitamin A & D ointment     ZOSYN 3-0.375 G injection  Generic drug:  piperacillin-tazobactam      TAKE these medications       acetaminophen 160 MG/5ML solution  Commonly known as:  TYLENOL  Take 15.6 mLs (500 mg total) by mouth every 6 (six) hours as needed for mild pain, headache or fever.     amiodarone 200 MG tablet  Commonly known as:  PACERONE  Take 1 tablet (200 mg total) by mouth 2 (two) times daily.     atorvastatin 40 MG tablet  Commonly known as:  LIPITOR  Take 40 mg by mouth daily.     chlorhexidine 0.12 % solution  Commonly known as:  PERIDEX  Use as  directed 15 mLs in the mouth or throat 2 (two) times daily.     dextrose 5 % solution  Inject 50 mLs into  the vein continuous.     diltiazem 120 MG tablet  Commonly known as:  CARDIZEM  Take 1 tablet (120 mg total) by mouth 4 (four) times daily -  with meals and at bedtime.     feeding supplement (OSMOLITE 1.5 CAL) Liqd  Place 1,000 mLs into feeding tube continuous.     feeding supplement (PRO-STAT SUGAR FREE 64) Liqd  Place 30 mLs into feeding tube 2 (two) times daily at 10 AM and 5 PM.     insulin aspart 100 UNIT/ML injection  Commonly known as:  novoLOG  Inject 0-15 Units into the skin every 4 (four) hours.     levalbuterol 0.63 MG/3ML nebulizer solution  Commonly known as:  XOPENEX  Take 3 mLs (0.63 mg total) by nebulization every 6 (six) hours as needed for wheezing or shortness of breath.     metoprolol 1 MG/ML injection  Commonly known as:  LOPRESSOR  Inject 10 mLs (10 mg total) into the vein every 4 (four) hours as needed (give for HR > 110- hold and call MD if SBP less than 90).     metoprolol tartrate 25 MG tablet  Commonly known as:  LOPRESSOR  Take 1 tablet (25 mg total) by mouth 2 (two) times daily.     naphazoline 0.1 % ophthalmic solution  Commonly known as:  NAPHCON  Place 1 drop into both eyes 4 (four) times daily as needed for irritation.     nitroGLYCERIN 0.4 MG SL tablet  Commonly known as:  NITROSTAT  Place 0.4 mg under the tongue every 5 (five) minutes as needed for chest pain.     ondansetron 4 MG/2ML Soln injection  Commonly known as:  ZOFRAN  Inject 2 mLs (4 mg total) into the vein every 6 (six) hours as needed for nausea.     rivaroxaban 20 MG Tabs tablet  Commonly known as:  XARELTO  Take 1 tablet (20 mg total) by mouth daily with supper.     simethicone 40 MG/0.6ML drops  Commonly known as:  MYLICON  Take 0.6 mLs (40 mg total) by mouth 4 (four) times daily as needed for flatulence.           Follow-up Information   Follow up with  Richardson Dopp, PA-C On 05/03/2013. (At 10:10 AM (Dr. Jackalyn Lombard PA))    Specialty:  Physician Assistant   Contact information:   [address] [address] [phone]        Discharged Condition: fair  Time spent on discharge greater than 40 minutes.  Vital signs at Discharge. Temp:  [98 F (36.7 C)-98.4 F (36.9 C)] 98 F (36.7 C) (04/22 1132) Pulse Rate:  [55-96] 55 (04/22 1132) Resp:  [14-26] 23 (04/22 1132) BP: (87-112)/(50-71) 98/58 mmHg (04/22 1228) SpO2:  [86 %-99 %] 94 % (04/22 1132) FiO2 (%):  [28 %-35 %] 30 % (04/22 1132) Weight:  [89 kg (196 lb 3.4 oz)] 89 kg (196 lb 3.4 oz) (04/22 0451) Office follow up Special Information or instructions.  Per [facility].  Signed: Richardson Landry Neilson Oehlert ACNP Maryanna Shape PCCM Pager [phone] till 3 pm If no answer page [phone] 05/04/2013, 1:17 PM

## 2013-05-05 NOTE — Discharge Summary (Signed)
Agree with above Would continued to consider 24 hr trach collar weaning slowly  Mcarthur Rossettianiel J. Tyson AliasFeinstein, MD, FACP Pgr: [phone] [facility]

## 2013-06-07 ENCOUNTER — Encounter: Payer: Medicare HMO | Admitting: Interventional Cardiology

## 2013-06-22 ENCOUNTER — Encounter: Payer: Self-pay | Admitting: Interventional Cardiology

## 2013-12-09 NOTE — Progress Notes (Signed)
This encounter was created in error - please disregard.

## 2013-12-22 ENCOUNTER — Encounter (HOSPITAL_COMMUNITY): Payer: Self-pay | Admitting: Interventional Cardiology

## 2014-01-01 ENCOUNTER — Emergency Department (HOSPITAL_COMMUNITY): Payer: Medicare HMO

## 2014-01-01 ENCOUNTER — Inpatient Hospital Stay (HOSPITAL_COMMUNITY)
Admission: EM | Admit: 2014-01-01 | Discharge: 2014-01-13 | DRG: 291 | Disposition: E | Payer: Medicare HMO | Attending: Internal Medicine | Admitting: Internal Medicine

## 2014-01-01 ENCOUNTER — Encounter (HOSPITAL_COMMUNITY): Payer: Self-pay | Admitting: Emergency Medicine

## 2014-01-01 DIAGNOSIS — E119 Type 2 diabetes mellitus without complications: Secondary | ICD-10-CM | POA: Diagnosis present

## 2014-01-01 DIAGNOSIS — Z79899 Other long term (current) drug therapy: Secondary | ICD-10-CM | POA: Diagnosis not present

## 2014-01-01 DIAGNOSIS — E669 Obesity, unspecified: Secondary | ICD-10-CM | POA: Diagnosis present

## 2014-01-01 DIAGNOSIS — M545 Low back pain: Secondary | ICD-10-CM | POA: Diagnosis present

## 2014-01-01 DIAGNOSIS — E785 Hyperlipidemia, unspecified: Secondary | ICD-10-CM | POA: Diagnosis present

## 2014-01-01 DIAGNOSIS — I5033 Acute on chronic diastolic (congestive) heart failure: Secondary | ICD-10-CM | POA: Diagnosis present

## 2014-01-01 DIAGNOSIS — R0600 Dyspnea, unspecified: Secondary | ICD-10-CM

## 2014-01-01 DIAGNOSIS — G2581 Restless legs syndrome: Secondary | ICD-10-CM | POA: Diagnosis present

## 2014-01-01 DIAGNOSIS — Z515 Encounter for palliative care: Secondary | ICD-10-CM | POA: Diagnosis not present

## 2014-01-01 DIAGNOSIS — J690 Pneumonitis due to inhalation of food and vomit: Secondary | ICD-10-CM | POA: Diagnosis present

## 2014-01-01 DIAGNOSIS — Z7901 Long term (current) use of anticoagulants: Secondary | ICD-10-CM | POA: Diagnosis not present

## 2014-01-01 DIAGNOSIS — J9601 Acute respiratory failure with hypoxia: Secondary | ICD-10-CM

## 2014-01-01 DIAGNOSIS — G43909 Migraine, unspecified, not intractable, without status migrainosus: Secondary | ICD-10-CM | POA: Diagnosis present

## 2014-01-01 DIAGNOSIS — I2510 Atherosclerotic heart disease of native coronary artery without angina pectoris: Secondary | ICD-10-CM | POA: Diagnosis present

## 2014-01-01 DIAGNOSIS — J189 Pneumonia, unspecified organism: Secondary | ICD-10-CM | POA: Diagnosis present

## 2014-01-01 DIAGNOSIS — R001 Bradycardia, unspecified: Secondary | ICD-10-CM | POA: Diagnosis present

## 2014-01-01 DIAGNOSIS — H8109 Meniere's disease, unspecified ear: Secondary | ICD-10-CM | POA: Diagnosis present

## 2014-01-01 DIAGNOSIS — K219 Gastro-esophageal reflux disease without esophagitis: Secondary | ICD-10-CM | POA: Diagnosis present

## 2014-01-01 DIAGNOSIS — E780 Pure hypercholesterolemia: Secondary | ICD-10-CM | POA: Diagnosis present

## 2014-01-01 DIAGNOSIS — R4182 Altered mental status, unspecified: Secondary | ICD-10-CM

## 2014-01-01 DIAGNOSIS — I481 Persistent atrial fibrillation: Secondary | ICD-10-CM | POA: Diagnosis present

## 2014-01-01 DIAGNOSIS — J9610 Chronic respiratory failure, unspecified whether with hypoxia or hypercapnia: Secondary | ICD-10-CM | POA: Diagnosis present

## 2014-01-01 DIAGNOSIS — G9340 Encephalopathy, unspecified: Secondary | ICD-10-CM | POA: Diagnosis present

## 2014-01-01 DIAGNOSIS — F0390 Unspecified dementia without behavioral disturbance: Secondary | ICD-10-CM | POA: Diagnosis present

## 2014-01-01 DIAGNOSIS — R627 Adult failure to thrive: Secondary | ICD-10-CM | POA: Diagnosis present

## 2014-01-01 DIAGNOSIS — G4733 Obstructive sleep apnea (adult) (pediatric): Secondary | ICD-10-CM | POA: Diagnosis present

## 2014-01-01 DIAGNOSIS — Z66 Do not resuscitate: Secondary | ICD-10-CM | POA: Diagnosis present

## 2014-01-01 DIAGNOSIS — I509 Heart failure, unspecified: Secondary | ICD-10-CM

## 2014-01-01 DIAGNOSIS — I10 Essential (primary) hypertension: Secondary | ICD-10-CM

## 2014-01-01 DIAGNOSIS — I5020 Unspecified systolic (congestive) heart failure: Secondary | ICD-10-CM

## 2014-01-01 DIAGNOSIS — Z931 Gastrostomy status: Secondary | ICD-10-CM | POA: Diagnosis not present

## 2014-01-01 DIAGNOSIS — J45909 Unspecified asthma, uncomplicated: Secondary | ICD-10-CM | POA: Diagnosis present

## 2014-01-01 DIAGNOSIS — J9611 Chronic respiratory failure with hypoxia: Secondary | ICD-10-CM

## 2014-01-01 DIAGNOSIS — G8929 Other chronic pain: Secondary | ICD-10-CM | POA: Diagnosis present

## 2014-01-01 DIAGNOSIS — Z6828 Body mass index (BMI) 28.0-28.9, adult: Secondary | ICD-10-CM | POA: Diagnosis not present

## 2014-01-01 DIAGNOSIS — Z87891 Personal history of nicotine dependence: Secondary | ICD-10-CM | POA: Diagnosis not present

## 2014-01-01 DIAGNOSIS — J9600 Acute respiratory failure, unspecified whether with hypoxia or hypercapnia: Secondary | ICD-10-CM | POA: Diagnosis present

## 2014-01-01 DIAGNOSIS — M1990 Unspecified osteoarthritis, unspecified site: Secondary | ICD-10-CM | POA: Diagnosis present

## 2014-01-01 DIAGNOSIS — D638 Anemia in other chronic diseases classified elsewhere: Secondary | ICD-10-CM | POA: Diagnosis present

## 2014-01-01 HISTORY — DX: Heart failure, unspecified: I50.9

## 2014-01-01 LAB — CBC WITH DIFFERENTIAL/PLATELET
BASOS ABS: 0 10*3/uL (ref 0.0–0.1)
Basophils Relative: 0 % (ref 0–1)
EOS ABS: 0 10*3/uL (ref 0.0–0.7)
EOS PCT: 0 % (ref 0–5)
HCT: 25.9 % — ABNORMAL LOW (ref 39.0–52.0)
Hemoglobin: 7.3 g/dL — ABNORMAL LOW (ref 13.0–17.0)
Lymphocytes Relative: 13 % (ref 12–46)
Lymphs Abs: 1.2 10*3/uL (ref 0.7–4.0)
MCH: 27.1 pg (ref 26.0–34.0)
MCHC: 28.2 g/dL — ABNORMAL LOW (ref 30.0–36.0)
MCV: 96.3 fL (ref 78.0–100.0)
MONO ABS: 1.1 10*3/uL — AB (ref 0.1–1.0)
Monocytes Relative: 12 % (ref 3–12)
Neutro Abs: 7.3 10*3/uL (ref 1.7–7.7)
Neutrophils Relative %: 75 % (ref 43–77)
Platelets: 114 10*3/uL — ABNORMAL LOW (ref 150–400)
RBC: 2.69 MIL/uL — ABNORMAL LOW (ref 4.22–5.81)
RDW: 17.2 % — AB (ref 11.5–15.5)
WBC: 9.7 10*3/uL (ref 4.0–10.5)

## 2014-01-01 LAB — COMPREHENSIVE METABOLIC PANEL
ALT: 13 U/L (ref 0–53)
AST: 16 U/L (ref 0–37)
Albumin: 2.2 g/dL — ABNORMAL LOW (ref 3.5–5.2)
Alkaline Phosphatase: 55 U/L (ref 39–117)
Anion gap: 5 (ref 5–15)
BUN: 25 mg/dL — ABNORMAL HIGH (ref 6–23)
CALCIUM: 8.5 mg/dL (ref 8.4–10.5)
CO2: 37 mEq/L — ABNORMAL HIGH (ref 19–32)
CREATININE: 0.58 mg/dL (ref 0.50–1.35)
Chloride: 95 mEq/L — ABNORMAL LOW (ref 96–112)
GFR calc Af Amer: >90 mL/min (ref >90)
GFR calc non Af Amer: >90 mL/min (ref >90)
Glucose, Bld: 133 mg/dL — ABNORMAL HIGH (ref 70–99)
Potassium: 4.7 mEq/L (ref 3.7–5.3)
Sodium: 137 mEq/L (ref 137–147)
Total Bilirubin: 0.4 mg/dL (ref 0.3–1.2)
Total Protein: 5.8 g/dL — ABNORMAL LOW (ref 6.0–8.3)

## 2014-01-01 LAB — I-STAT VENOUS BLOOD GAS, ED
ACID-BASE EXCESS: 13 mmol/L — AB (ref 0.0–2.0)
Acid-Base Excess: 12 mmol/L — ABNORMAL HIGH (ref 0.0–2.0)
Bicarbonate: 42 mEq/L — ABNORMAL HIGH (ref 20.0–24.0)
Bicarbonate: 41.3 mEq/L — ABNORMAL HIGH (ref 20.0–24.0)
O2 Saturation: 57 %
O2 Saturation: 28 %
PCO2 VEN: 92.2 mmHg — AB (ref 45.0–50.0)
PH VEN: 7.259 (ref 7.250–7.300)
Patient temperature: 98.6
TCO2: 45 mmol/L (ref 0–100)
TCO2: 44 mmol/L (ref 0–100)
pCO2, Ven: 90.1 mmHg (ref 45.0–50.0)
pH, Ven: 7.277 (ref 7.250–7.300)
pO2, Ven: 36 mmHg (ref 30.0–45.0)
pO2, Ven: 23 mmHg — CL (ref 30.0–45.0)

## 2014-01-01 LAB — URINALYSIS, ROUTINE W REFLEX MICROSCOPIC
Bilirubin Urine: NEGATIVE
Glucose, UA: NEGATIVE mg/dL
Hgb urine dipstick: NEGATIVE
KETONES UR: NEGATIVE mg/dL
LEUKOCYTES UA: NEGATIVE
NITRITE: NEGATIVE
PH: 5.5 (ref 5.0–8.0)
Protein, ur: 30 mg/dL — AB
Specific Gravity, Urine: 1.02 (ref 1.005–1.030)
UROBILINOGEN UA: 0.2 mg/dL (ref 0.0–1.0)

## 2014-01-01 LAB — I-STAT CHEM 8, ED
BUN: 32 mg/dL — ABNORMAL HIGH (ref 6–23)
CALCIUM ION: 1.06 mmol/L — AB (ref 1.13–1.30)
CHLORIDE: 91 meq/L — AB (ref 96–112)
Creatinine, Ser: 0.8 mg/dL (ref 0.50–1.35)
GLUCOSE: 127 mg/dL — AB (ref 70–99)
HEMATOCRIT: 24 % — AB (ref 39.0–52.0)
Hemoglobin: 8.2 g/dL — ABNORMAL LOW (ref 13.0–17.0)
Potassium: 4.8 mEq/L (ref 3.7–5.3)
Sodium: 134 mEq/L — ABNORMAL LOW (ref 137–147)
TCO2: 35 mmol/L (ref 0–100)

## 2014-01-01 LAB — GLUCOSE, CAPILLARY
GLUCOSE-CAPILLARY: 96 mg/dL (ref 70–99)
Glucose-Capillary: 126 mg/dL — ABNORMAL HIGH (ref 70–99)
Glucose-Capillary: 99 mg/dL (ref 70–99)

## 2014-01-01 LAB — STREP PNEUMONIAE URINARY ANTIGEN: Strep Pneumo Urinary Antigen: NEGATIVE

## 2014-01-01 LAB — URINE MICROSCOPIC-ADD ON

## 2014-01-01 LAB — PROTIME-INR
INR: 1.75 — AB (ref 0.00–1.49)
PROTHROMBIN TIME: 20.6 s — AB (ref 11.6–15.2)

## 2014-01-01 LAB — I-STAT CG4 LACTIC ACID, ED: Lactic Acid, Venous: 1.58 mmol/L (ref 0.5–2.2)

## 2014-01-01 LAB — PRO B NATRIURETIC PEPTIDE: Pro B Natriuretic peptide (BNP): 2529 pg/mL — ABNORMAL HIGH (ref 0–450)

## 2014-01-01 LAB — TROPONIN I

## 2014-01-01 LAB — MRSA PCR SCREENING: MRSA BY PCR: NEGATIVE

## 2014-01-01 MED ORDER — CHLORHEXIDINE GLUCONATE CLOTH 2 % EX PADS
6.0000 | MEDICATED_PAD | Freq: Every day | CUTANEOUS | Status: DC
  Administered 2014-01-02 – 2014-01-03 (×2): 6 via TOPICAL

## 2014-01-01 MED ORDER — ONDANSETRON HCL 4 MG/2ML IJ SOLN: 4.0000 mg | Freq: Four times a day (QID) | INTRAMUSCULAR | Status: DC | PRN

## 2014-01-01 MED ORDER — OSMOLITE 1.2 CAL PO LIQD
1000.0000 mL | ORAL | Status: DC
  Administered 2014-01-01 – 2014-01-03 (×2): 1000 mL
  Filled 2014-01-01 (×3): qty 1000

## 2014-01-01 MED ORDER — DEXTROSE 5 % IV SOLN
2.0000 g | Freq: Once | INTRAVENOUS | Status: AC
  Administered 2014-01-01: 2 g via INTRAVENOUS
  Filled 2014-01-01: qty 2

## 2014-01-01 MED ORDER — FUROSEMIDE 10 MG/ML IJ SOLN
40.0000 mg | Freq: Every day | INTRAMUSCULAR | Status: DC
  Administered 2014-01-01 – 2014-01-02 (×2): 40 mg via INTRAVENOUS
  Filled 2014-01-01 (×2): qty 4

## 2014-01-01 MED ORDER — SODIUM CHLORIDE 0.9 % IJ SOLN
3.0000 mL | INTRAMUSCULAR | Status: DC | PRN
  Administered 2014-01-03: 3 mL via INTRAVENOUS
  Filled 2014-01-01: qty 3

## 2014-01-01 MED ORDER — SODIUM CHLORIDE 0.9 % IV BOLUS (SEPSIS)
1000.0000 mL | Freq: Once | INTRAVENOUS | Status: AC
  Administered 2014-01-01: 1000 mL via INTRAVENOUS

## 2014-01-01 MED ORDER — FUROSEMIDE 10 MG/ML IJ SOLN
40.0000 mg | Freq: Once | INTRAMUSCULAR | Status: AC
  Administered 2014-01-01: 40 mg via INTRAVENOUS
  Filled 2014-01-01: qty 4

## 2014-01-01 MED ORDER — SODIUM CHLORIDE 0.9 % IJ SOLN
3.0000 mL | Freq: Two times a day (BID) | INTRAMUSCULAR | Status: DC
  Administered 2014-01-01 – 2014-01-03 (×5): 3 mL via INTRAVENOUS

## 2014-01-01 MED ORDER — VANCOMYCIN HCL IN DEXTROSE 1-5 GM/200ML-% IV SOLN
1000.0000 mg | Freq: Once | INTRAVENOUS | Status: AC
  Administered 2014-01-01: 1000 mg via INTRAVENOUS
  Filled 2014-01-01: qty 200

## 2014-01-01 MED ORDER — VANCOMYCIN HCL IN DEXTROSE 1-5 GM/200ML-% IV SOLN
1000.0000 mg | Freq: Three times a day (TID) | INTRAVENOUS | Status: DC
  Administered 2014-01-01 – 2014-01-03 (×5): 1000 mg via INTRAVENOUS
  Filled 2014-01-01 (×7): qty 200

## 2014-01-01 MED ORDER — DEXTROSE 5 % IV SOLN
1.0000 g | Freq: Three times a day (TID) | INTRAVENOUS | Status: DC
  Administered 2014-01-01 – 2014-01-03 (×6): 1 g via INTRAVENOUS
  Filled 2014-01-01 (×8): qty 1

## 2014-01-01 MED ORDER — SODIUM CHLORIDE 0.9 % IV SOLN
250.0000 mL | INTRAVENOUS | Status: DC | PRN
  Administered 2014-01-01 – 2014-01-03 (×3): 250 mL via INTRAVENOUS

## 2014-01-01 MED ORDER — ACETAMINOPHEN 325 MG PO TABS: 650.0000 mg | ORAL_TABLET | ORAL | Status: DC | PRN

## 2014-01-01 NOTE — ED Provider Notes (Signed)
CSN: [id number]     Arrival date & time 01/21/14  1038 History   First MD Initiated Contact with Patient January 21, 2014 1102     Chief Complaint  Patient presents with  . Altered Mental Status  . Bradycardia  . Code Sepsis     (Consider location/radiation/quality/duration/timing/severity/associated sxs/prior Treatment) Patient is a 79 y.o. male presenting with altered mental status. The history is provided by the EMS personnel and the nursing home.  Altered Mental Status Presenting symptoms: lethargy   Severity:  Severe Most recent episode:  More than 2 days ago Episode history:  Single Timing:  Constant Progression:  Unchanged Chronicity:  New Context: nursing home resident   Context: not a recent change in medication, not a recent illness and not a recent infection     Past Medical History  Diagnosis Date  . Hypertension   . Hypercholesteremia   . Asthma   . Meniere disease   . Persistent atrial fibrillation     a. s/p multiple dccv's;  b. failed amio;  c. not felt to be AF RFCA canddiate due to LA dil;  d. s/p failed hybrid ablation @ [facility] in 09/2012;  e. chronic pradaxa.  . Obesity   . Biatrial enlargement     LA size 5.3cm  . Obstructive sleep apnea     AHI 108/hr now on CPAP at 12cm H2O  . H/O hiatal hernia   . GERD (gastroesophageal reflux disease)     "associated w/hiatal hernia" (06-24-2012)  . Peptic ulcer 1950's  . Migraines     "haven't had one for about 15 years" (2012/06/24)  . Arthritis     "joints" (06/24/2012)  . Chronic lower back pain   . Nephrolithiasis ~ 1960's    "passed on their own" (June 24, 2012)  . History of pneumonia 2002; 2006    "spent 8 days in isolation; Norovirus" (Jun 24, 2012)  . Other and unspecified angina pectoris   . RLS (restless legs syndrome)   . Coronary artery disease     a. 05/2012 Cath/PCI: LM nl, LAD nl, LCX 31m (3.0x15 Integrity BMS), PTCA of OM1 through stent struts (kissing balloon).  . Diabetes mellitus without complication      FAMILY STATES PATIENT IS NOT DIABETIC   Past Surgical History  Procedure Laterality Date  . Wrist fracture surgery  1992    "repaired w/left hip bone graft" (06/24/12)  . Total knee arthroplasty  08/19/1999  . Colonoscopy w/ polypectomy  2006  . Knee arthroscopy with meniscal repair Right 1974    "medial meniscus repaired" (June 24, 2012)  . Cardioversion  10/02/2011    Procedure: CARDIOVERSION;  Surgeon: Corky Crafts, MD;  Location: [facility] ENDOSCOPY;  Service: Cardiovascular;  Laterality: N/A;  h/p in file drawer  . Cardioversion  11/07/2011    Procedure: CARDIOVERSION;  Surgeon: Corky Crafts, MD;  Location: [facility] ENDOSCOPY;  Service: Cardiovascular;  Laterality: N/A;  h/p from 10/22 in file drawer/dl  . Cardiac catheterization  05/26/2012  . Coronary angioplasty with stent placement  24-Jun-2012    "1" (24-Jun-2012)  . Cataract extraction w/ intraocular lens  implant, bilateral  2009  . Hemorrhoid surgery  ~ 2003  . Hiatal hernia repair  1983  . Nissen fundoplication  1983  . Ablation of dysrhythmic focus  SEPT/OCT 2014    ATRIAL FIB  . Inguinal hernia repair Bilateral 2000 and 2005    "one done at a time" (06/24/12)  . Colon surgery  10/08/2012   . Tracheostomy  OCT 2014  .  Cardioversion N/A 04/08/2013    Procedure: CARDIOVERSION    (BEDSIDE) ;  Surgeon: Luis AbedJeffrey D Katz, MD;  [facility];  Service: Cardiovascular;  Laterality: N/A;  . Percutaneous coronary stent intervention (pci-s) N/A 06/04/2012    Procedure: PERCUTANEOUS CORONARY STENT INTERVENTION (PCI-S);  Surgeon: Corky CraftsJayadeep S Varanasi, MD;  [facility];  Service: Cardiovascular;  Laterality: N/A;   Family History  Problem Relation Age of Onset  . Congestive Heart Failure    . COPD Father   . Heart failure Sister   . Heart failure Brother   . CVA Brother   . Heart attack Brother   . Heart disease Brother   . CVA Brother   . Heart disease Brother    History  Substance Use Topics  . Smoking status: Former  Smoker -- 25 years    Types: Pipe    Quit date: 10/15/1981  . Smokeless tobacco: Never Used  . Alcohol Use: No    Review of Systems  Unable to perform ROS: Mental status change      Allergies  Corticosteroids and Furosemide  Home Medications   Prior to Admission medications   Medication Sig Start Date End Date Taking? Authorizing Provider  acetaminophen (TYLENOL) 160 MG/5ML solution Take 15.6 mLs (500 mg total) by mouth every 6 (six) hours as needed for mild pain, headache or fever. 05/04/13   Vilinda BlanksWilliam S Minor, NP  Amino Acids-Protein Hydrolys (FEEDING SUPPLEMENT, PRO-STAT SUGAR FREE 64,) LIQD Place 30 mLs into feeding tube 2 (two) times daily at 10 AM and 5 PM. 05/04/13   Vilinda BlanksWilliam S Minor, NP  amiodarone (PACERONE) 200 MG tablet Take 1 tablet (200 mg total) by mouth 2 (two) times daily. 05/04/13   Vilinda BlanksWilliam S Minor, NP  atorvastatin (LIPITOR) 40 MG tablet Take 40 mg by mouth daily.    Historical Provider, MD  busPIRone (BUSPAR) 7.5 MG tablet  05/30/13   Historical Provider, MD  chlorhexidine (PERIDEX) 0.12 % solution Use as directed 15 mLs in the mouth or throat 2 (two) times daily.    Historical Provider, MD  dexamethasone (DECADRON) 4 MG tablet  05/17/13   Historical Provider, MD  dextrose 5 % solution Inject 50 mLs into the vein continuous. 05/04/13   Vilinda BlanksWilliam S Minor, NP  diltiazem (CARDIZEM) 120 MG tablet Take 1 tablet (120 mg total) by mouth 4 (four) times daily -  with meals and at bedtime. 05/04/13   Vilinda BlanksWilliam S Minor, NP  fluconazole (DIFLUCAN) 200 MG tablet  05/06/13   Historical Provider, MD  HYDROcodone-acetaminophen (NORCO/VICODIN) 5-325 MG per tablet  05/25/13   Historical Provider, MD  insulin aspart (NOVOLOG) 100 UNIT/ML injection Inject 0-15 Units into the skin every 4 (four) hours. 05/04/13   Vilinda BlanksWilliam S Minor, NP  ipratropium-albuterol (DUONEB) 0.5-2.5 (3) MG/3ML SOLN  05/04/13   Historical Provider, MD  levalbuterol Pauline Aus(XOPENEX) 0.63 MG/3ML nebulizer solution Take 3 mLs (0.63 mg total)  by nebulization every 6 (six) hours as needed for wheezing or shortness of breath. 05/04/13   Vilinda BlanksWilliam S Minor, NP  meclizine (ANTIVERT) 25 MG tablet  05/17/13   Historical Provider, MD  metoprolol (LOPRESSOR) 1 MG/ML injection Inject 10 mLs (10 mg total) into the vein every 4 (four) hours as needed (give for HR > 110- hold and call MD if SBP less than 90). 05/04/13   Vilinda BlanksWilliam S Minor, NP  metoprolol tartrate (LOPRESSOR) 25 MG tablet Take 1 tablet (25 mg total) by mouth 2 (two) times daily. 05/04/13   Chrissie NoaWilliam  S Minor, NP  naphazoline (NAPHCON) 0.1 % ophthalmic solution Place 1 drop into both eyes 4 (four) times daily as needed for irritation. 05/04/13   Vilinda Blanks Minor, NP  nitroGLYCERIN (NITROSTAT) 0.4 MG SL tablet Place 0.4 mg under the tongue every 5 (five) minutes as needed for chest pain.    Historical Provider, MD  NOVOLIN R 100 UNIT/ML injection  05/20/13   Historical Provider, MD  Nutritional Supplements (FEEDING SUPPLEMENT, OSMOLITE 1.5 CAL,) LIQD Place 1,000 mLs into feeding tube continuous. 05/04/13   Vilinda Blanks Minor, NP  ondansetron (ZOFRAN) 4 MG/2ML SOLN injection Inject 2 mLs (4 mg total) into the vein every 6 (six) hours as needed for nausea. 05/04/13   Vilinda Blanks Minor, NP  rivaroxaban (XARELTO) 20 MG TABS tablet Take 1 tablet (20 mg total) by mouth daily with supper. 05/04/13   Vilinda Blanks Minor, NP  simethicone (MYLICON) 40 MG/0.6ML drops Take 0.6 mLs (40 mg total) by mouth 4 (four) times daily as needed for flatulence. 05/04/13   Vilinda Blanks Minor, NP   BP 102/53 mmHg  Pulse 50  Resp 15  SpO2 100% Physical Exam  Constitutional: He appears well-developed and well-nourished. He appears lethargic. He appears distressed.  HENT:  Head: Normocephalic and atraumatic.  Mouth/Throat: No oropharyngeal exudate.  Eyes: EOM are normal. Pupils are equal, round, and reactive to light.  Neck: Normal range of motion. Neck supple.  Cardiovascular: Normal rate and regular rhythm.  Exam reveals no friction rub.    No murmur heard. Pulmonary/Chest: Effort normal and breath sounds normal. No respiratory distress. He has no wheezes. He has no rales.  Abdominal: He exhibits no distension. There is no tenderness. There is no rebound.  Musculoskeletal: Normal range of motion. He exhibits no edema.  Neurological: He appears lethargic. He exhibits normal muscle tone.  Uncooperative with Neuro exam  Skin: He is not diaphoretic.    ED Course  Procedures (including critical care time) Labs Review Labs Reviewed  I-STAT CHEM 8, ED - Abnormal; Notable for the following:    Sodium 134 (*)    Chloride 91 (*)    BUN 32 (*)    Glucose, Bld 127 (*)    Calcium, Ion 1.06 (*)    Hemoglobin 8.2 (*)    HCT 24.0 (*)    All other components within normal limits  I-STAT VENOUS BLOOD GAS, ED - Abnormal; Notable for the following:    pCO2, Ven 90.1 (*)    Bicarbonate 42.0 (*)    Acid-Base Excess 13.0 (*)    All other components within normal limits  CULTURE, BLOOD (ROUTINE X 2)  CULTURE, BLOOD (ROUTINE X 2)  URINE CULTURE  CBC WITH DIFFERENTIAL  COMPREHENSIVE METABOLIC PANEL  PROTIME-INR  URINALYSIS, ROUTINE W REFLEX MICROSCOPIC  BLOOD GAS, ARTERIAL  TROPONIN I  I-STAT CG4 LACTIC ACID, ED    Imaging Review Dg Chest Portable 1 View  12/15/2013   CLINICAL DATA:  Bradycardia  EXAM: PORTABLE CHEST - 1 VIEW  COMPARISON:  05/04/2013  FINDINGS: There is bilateral interstitial and alveolar airspace opacities. There is a small left pleural effusion. There is no pneumothorax. There is stable cardiomegaly. There is no acute osseus abnormality.  IMPRESSION: Overall findings concerning for CHF.   Electronically Signed   By: Elige Ko   On: 12/15/2013 11:53     EKG Interpretation   Date/Time:  Sunday January 01 2014 10:52:26 EST Ventricular Rate:  53 PR Interval:    QRS Duration: 107 QT Interval:  596 QTC Calculation: 560 R Axis:   -11 Text Interpretation:  Junctional rhythm S1,S2,S3 pattern Borderline T   abnormalities, inferior leads Prolonged QT interval Similar to prior, 1st  degree AV block noted No evidence of 3rd degree heart block Confirmed by  Gwendolyn GrantWALDEN  MD, Kennidy Lamke ([id number]) on 01/02/2014 11:08:35 AM     CRITICAL CARE Performed by: Dagmar HaitWALDEN, WILLIAM Loreal Schuessler   Total critical care time: 30 minutes  Critical care time was exclusive of separately billable procedures and treating other patients.  Critical care was necessary to treat or prevent imminent or life-threatening deterioration.  Critical care was time spent personally by me on the following activities: development of treatment plan with patient and/or surrogate as well as nursing, discussions with consultants, evaluation of patient's response to treatment, examination of patient, obtaining history from patient or surrogate, ordering and performing treatments and interventions, ordering and review of laboratory studies, ordering and review of radiographic studies, pulse oximetry and re-evaluation of patient's condition.   MDM   Final diagnoses:  Altered mental status    63M here with altered mental status. Present for 3 days at the nursing home. Upon EMS arrival, patient altered, thready pulses. Patient's pulse rate was in the 20s, EMS began pacing him with improvement of BP and improvement of mental status. Patient is on Xarelto. He has hx of chronic respiratory failure and is s/p trach removal after extended stay at a rehab hospital. Given atropine and glucagon en route. Pacing stopped here, with HRs in the 40s. EKG without evidence of 3rd degree heart block. Narrow QRS complex, no disassociation. Patient will follow commands like squeeze his hands, is somnolent.  Initial labs show pH of 7.277, pCO2 in the 90s. Normal lactate. No white count.  He has a DNR form here and had palliative care notes from his last visit.  Fluids given, broad spectrum antibiotics initiated. CXR shows concern for CHF, intiiated on BiPap since retaining  CO2. [facility]    Elwin MochaBlair Valley Ke, MD 12/28/2013 [phone]

## 2014-01-01 NOTE — ED Notes (Signed)
Pt currently on ED pacer rate of 70 100 MA

## 2014-01-01 NOTE — Progress Notes (Signed)
ABG attempted by RT, sample was venous. RN made aware.

## 2014-01-01 NOTE — Progress Notes (Signed)
Pt transported from Ed to 3W18 on Bipap, no complications noted.

## 2014-01-01 NOTE — H&P (Signed)
History and Physical       Hospital Admission Note Date: 01/09/2014  Patient name: Tim Short Medical record number: [id number] Date of birth: 03/12/1934 Age: 79 y.o. Gender: male  PCP:  Duane Lopeoss, Alan, MD    Chief Complaint:  Brought from skilled nursing facility due to altered mental status, bradycardia  HPI: Patient is a 79 year old male with hypertension and hyperlipidemia, atrial fibrillation on xarelto, diabetes mellitus, CAD, GERD, obstructive sleep apnea, chronic respiratory failure, PEG tube placement was brought from the skilled nursing facility with altered mental status. History is based on transfer records and ER records. No family member is present at the bedside. Patient was brought via EMS, was found to be diaphoretic and pale on their arrival and patient apparently had altered mental status for 3 days. Patient was currently being paced by the EMS but was discontinued in the ED. Patient is also on amiodarone, Metoprolol and Cardizem prior to admission, BP was 82/49 with heart rate of 45 respiratory rate 27 at the time of triage. Venous ABG showed a pH of 7.2 with PCO2 of 92, patient was placed on BiPAP in ED Chest x-ray showed findings concerning for CHF, bilateral interstitial and alveolar airspace opacities, no pneumothorax CT head is still pending at the time of my encounter  Review of Systems:  Unable to obtain from the patient due to his mental status   Past Medical History: Past Medical History  Diagnosis Date  . Hypertension   . Hypercholesteremia   . Asthma   . Meniere disease   . Persistent atrial fibrillation     a. s/p multiple dccv's;  b. failed amio;  c. not felt to be AF RFCA canddiate due to LA dil;  d. s/p failed hybrid ablation @ [facility] in 09/2012;  e. chronic pradaxa.  . Obesity   . Biatrial enlargement     LA size 5.3cm  . Obstructive sleep apnea     AHI 108/hr now on CPAP at 12cm H2O  . H/O  hiatal hernia   . GERD (gastroesophageal reflux disease)     "associated w/hiatal hernia" (06/04/2012)  . Peptic ulcer 1950's  . Migraines     "haven't had one for about 15 years" (06/04/2012)  . Arthritis     "joints" (06/04/2012)  . Chronic lower back pain   . Nephrolithiasis ~ 1960's    "passed on their own" (06/04/2012)  . History of pneumonia 2002; 2006    "spent 8 days in isolation; Norovirus" (06/04/2012)  . Other and unspecified angina pectoris   . RLS (restless legs syndrome)   . Coronary artery disease     a. 05/2012 Cath/PCI: LM nl, LAD nl, LCX 5937m (3.0x15 Integrity BMS), PTCA of OM1 through stent struts (kissing balloon).  . Diabetes mellitus without complication     FAMILY STATES PATIENT IS NOT DIABETIC  . CHF (congestive heart failure) 12/30/2013   Past Surgical History  Procedure Laterality Date  . Wrist fracture surgery  1992    "repaired w/left hip bone graft" (06/04/2012)  . Total knee arthroplasty  08/19/1999  . Colonoscopy w/ polypectomy  2006  . Knee arthroscopy with meniscal repair Right 1974    "medial meniscus repaired" (06/04/2012)  . Cardioversion  10/02/2011    Procedure: CARDIOVERSION;  Surgeon: Corky CraftsJayadeep S. Varanasi, MD;  [facility];  Service: Cardiovascular;  Laterality: N/A;  h/p in file drawer  . Cardioversion  11/07/2011    Procedure: CARDIOVERSION;  Surgeon: Corky CraftsJayadeep S. Varanasi, MD;  Location:  [facility] ENDOSCOPY;  Service: Cardiovascular;  Laterality: N/A;  h/p from 10/22 in file drawer/dl  . Cardiac catheterization  05/26/2012  . Coronary angioplasty with stent placement  06/04/2012    "1" (06/04/2012)  . Cataract extraction w/ intraocular lens  implant, bilateral  2009  . Hemorrhoid surgery  ~ 2003  . Hiatal hernia repair  1983  . Nissen fundoplication  1983  . Ablation of dysrhythmic focus  SEPT/OCT 2014    ATRIAL FIB  . Inguinal hernia repair Bilateral 2000 and 2005    "one done at a time" (06/04/2012)  . Colon surgery  10/08/2012   .  Tracheostomy  OCT 2014  . Cardioversion N/A 04/08/2013    Procedure: CARDIOVERSION    (BEDSIDE) ;  Surgeon: Luis Abed, MD;  Location: [facility] OR;  Service: Cardiovascular;  Laterality: N/A;  . Percutaneous coronary stent intervention (pci-s) N/A 06/04/2012    Procedure: PERCUTANEOUS CORONARY STENT INTERVENTION (PCI-S);  Surgeon: Corky Crafts, MD;  Location: [facility] CATH LAB;  Service: Cardiovascular;  Laterality: N/A;    Medications: Prior to Admission medications   Medication Sig Start Date End Date Taking? Authorizing Provider  amiodarone (PACERONE) 100 MG tablet Give 100 mg by tube daily.   Yes Historical Provider, MD  atorvastatin (LIPITOR) 40 MG tablet Give 40 mg by tube daily.    Yes Historical Provider, MD  buPROPion (WELLBUTRIN XL) 150 MG 24 hr tablet Take 150 mg by mouth daily.   Yes Historical Provider, MD  gabapentin (NEURONTIN) 600 MG tablet Give 300 mg by tube at bedtime.   Yes Historical Provider, MD  HYDROcodone-acetaminophen (NORCO/VICODIN) 5-325 MG per tablet Give 1 tablet by tube every 8 (eight) hours.  05/25/13  Yes Historical Provider, MD  Lactobacillus Rhamnosus, GG, (CULTURELLE PO) Give 1 tablet by tube 2 (two) times daily.   Yes Historical Provider, MD  meclizine (ANTIVERT) 25 MG tablet Give 25 mg by tube every 8 (eight) hours.  05/17/13  Yes Historical Provider, MD  Melatonin 3 MG CAPS Give 1 capsule by tube daily.   Yes Historical Provider, MD  metoprolol tartrate (LOPRESSOR) 25 MG tablet Take 1 tablet (25 mg total) by mouth 2 (two) times daily. Patient taking differently: Give 25 mg by tube every 6 (six) hours as needed (pulse >110 and systolic BP greater than 100).  05/04/13  Yes Vilinda Blanks Minor, NP  naphazoline (NAPHCON) 0.1 % ophthalmic solution Place 1 drop into both eyes 4 (four) times daily as needed for irritation. 05/04/13  Yes Vilinda Blanks Minor, NP  Nutritional Supplements (FEEDING SUPPLEMENT, OSMOLITE 1.5 CAL,) LIQD Place 1,000 mLs into feeding tube continuous.  05/04/13  Yes Vilinda Blanks Minor, NP  paroxetine (PAXIL) 10 MG/5ML suspension Give 30 mg by tube daily.   Yes Historical Provider, MD  promethazine (PHENERGAN) 25 MG tablet Give 25 mg by tube every 6 (six) hours as needed for nausea or vomiting.   Yes Historical Provider, MD  rivaroxaban (XARELTO) 20 MG TABS tablet Take 1 tablet (20 mg total) by mouth daily with supper. Patient taking differently: Give 20 mg by tube daily with supper.  05/04/13  Yes Vilinda Blanks Minor, NP  acetaminophen (TYLENOL) 160 MG/5ML solution Take 15.6 mLs (500 mg total) by mouth every 6 (six) hours as needed for mild pain, headache or fever. Patient taking differently: Place 500 mg into feeding tube every 6 (six) hours as needed for mild pain, headache or fever.  05/04/13   Vilinda Blanks Minor, NP  Amino Acids-Protein Hydrolys (  FEEDING SUPPLEMENT, PRO-STAT SUGAR FREE 64,) LIQD Place 30 mLs into feeding tube 2 (two) times daily at 10 AM and 5 PM. Patient not taking: Reported on 05-Jan-2014 05/04/13   Vilinda BlanksWilliam S Minor, NP  amiodarone (PACERONE) 200 MG tablet Take 1 tablet (200 mg total) by mouth 2 (two) times daily. Patient not taking: Reported on 05-Jan-2014 05/04/13   Vilinda BlanksWilliam S Minor, NP  dextrose 5 % solution Inject 50 mLs into the vein continuous. Patient not taking: Reported on 05-Jan-2014 05/04/13   Vilinda BlanksWilliam S Minor, NP  diltiazem (CARDIZEM) 120 MG tablet Take 1 tablet (120 mg total) by mouth 4 (four) times daily -  with meals and at bedtime. Patient not taking: Reported on 05-Jan-2014 05/04/13   Vilinda BlanksWilliam S Minor, NP  insulin aspart (NOVOLOG) 100 UNIT/ML injection Inject 0-15 Units into the skin every 4 (four) hours. Patient not taking: Reported on 05-Jan-2014 05/04/13   Vilinda BlanksWilliam S Minor, NP  ipratropium-albuterol (DUONEB) 0.5-2.5 (3) MG/3ML SOLN Take 3 mLs by nebulization every 4 (four) hours as needed.  05/04/13   Historical Provider, MD  levalbuterol Pauline Aus(XOPENEX) 0.63 MG/3ML nebulizer solution Take 3 mLs (0.63 mg total) by nebulization every 6  (six) hours as needed for wheezing or shortness of breath. 05/04/13   Vilinda BlanksWilliam S Minor, NP  metoprolol (LOPRESSOR) 1 MG/ML injection Inject 10 mLs (10 mg total) into the vein every 4 (four) hours as needed (give for HR > 110- hold and call MD if SBP less than 90). Patient not taking: Reported on 05-Jan-2014 05/04/13   Vilinda BlanksWilliam S Minor, NP  nitroGLYCERIN (NITROSTAT) 0.4 MG SL tablet Place 0.4 mg under the tongue every 5 (five) minutes as needed for chest pain.    Historical Provider, MD  ondansetron (ZOFRAN) 4 MG/2ML SOLN injection Inject 2 mLs (4 mg total) into the vein every 6 (six) hours as needed for nausea. 05/04/13   Vilinda BlanksWilliam S Minor, NP  simethicone (MYLICON) 40 MG/0.6ML drops Take 0.6 mLs (40 mg total) by mouth 4 (four) times daily as needed for flatulence. 05/04/13   Vilinda BlanksWilliam S Minor, NP    Allergies:   Allergies  Allergen Reactions  . Corticosteroids     Inhaled Corticosteroids--Hoarseness, dry mouth  . Furosemide Other (See Comments)    Discussed with son 4/15, mild reaction (rash?) with oral formulation only     Social History:  reports that he quit smoking about 32 years ago. His smoking use included Pipe. He has never used smokeless tobacco. He reports that he does not drink alcohol or use illicit drugs.  Family History: Family History  Problem Relation Age of Onset  . Congestive Heart Failure    . COPD Father   . Heart failure Sister   . Heart failure Brother   . CVA Brother   . Heart attack Brother   . Heart disease Brother   . CVA Brother   . Heart disease Brother     Physical Exam: Blood pressure 112/88, pulse 65, resp. rate 22, height 5' 8.9" (1.75 m), weight 87.3 kg (192 lb 7.4 oz), SpO2 88 %. General: Lethargic, opens eyes to verbal commands, pale, on BiPAP HEENT: normocephalic, atraumatic, anicteric sclera, pink conjunctiva, pupils equal and reactive to light and accomodation, Neck: supple, no masses or lymphadenopathy, no goiter, no bruits  Heart: Regular rate  and rhythm, without murmurs, rubs or gallops. Lungs: Decreased breath sounds throughout Abdomen: Soft, nontender, nondistended, positive bowel sounds, no masses. Extremities: No cyanosis or clubbing, 2-3+ pitting edema Neuro: Does not follow  any commands Psych: Lethargic, opens eyes to verbal commands Skin: no rashes or lesions, warm and dry   LABS on Admission:  Basic Metabolic Panel:  Recent Labs Lab 01-21-14 1050 01/21/2014 1100  NA 137 134*  K 4.7 4.8  CL 95* 91*  CO2 37*  --   GLUCOSE 133* 127*  BUN 25* 32*  CREATININE 0.58 0.80  CALCIUM 8.5  --    Liver Function Tests:  Recent Labs Lab Jan 21, 2014 1050  AST 16  ALT 13  ALKPHOS 55  BILITOT 0.4  PROT 5.8*  ALBUMIN 2.2*   No results for input(s): LIPASE, AMYLASE in the last 168 hours. No results for input(s): AMMONIA in the last 168 hours. CBC:  Recent Labs Lab 01/21/14 1050 21-Jan-2014 1100  WBC 9.7  --   NEUTROABS 7.3  --   HGB 7.3* 8.2*  HCT 25.9* 24.0*  MCV 96.3  --   PLT 114*  --    Cardiac Enzymes:  Recent Labs Lab 01/21/14 1050  TROPONINI <0.30   BNP: Invalid input(s): POCBNP CBG: No results for input(s): GLUCAP in the last 168 hours.   Radiological Exams on Admission: Dg Chest Portable 1 View  2014/01/21   CLINICAL DATA:  Bradycardia  EXAM: PORTABLE CHEST - 1 VIEW  COMPARISON:  05/04/2013  FINDINGS: There is bilateral interstitial and alveolar airspace opacities. There is a small left pleural effusion. There is no pneumothorax. There is stable cardiomegaly. There is no acute osseus abnormality.  IMPRESSION: Overall findings concerning for CHF.   Electronically Signed   By: Elige Ko   On: Jan 21, 2014 11:53    EKG showed rate 53, junctional rhythm, T-wave inversion in inferior leads  Assessment/Plan Principal Problem:   Acute respiratory failure likely due to acute CHF, prior EF 55-60% in 2013, possible bilateral alveolar space opacities due to  HCAP 1) acute CHF  - Placed on IV Lasix  40 mg daily, unfortunately cannot aggressively diurese due to borderline BP  - Hold Cardizem and beta blocker due to borderline hypotension  - Daily weights and strict I's and O's, obtain 2-D echocardiogram  - Placed on BiPAP for now  2) HCAP -Placed on IV vancomycin, cefepime, bronchodilators, BiPAP -  obtain a urine legionella antigen, urine strep antigen, blood cultures, procalcitonin    Active Problems:   Essential hypertension: Currently borderline hypotension - Hold beta blocker, Cardizem     CAD (coronary artery disease) - Rule out acute ACS, obtain 2-D echocardiogram   Atrial fibrillation with  Bradycardia - Hold amiodarone, metoprolol, Cardizem  - CT head is still pending, if no bleed, will restart xarelto   Dysphagia - Continue tube feeds, patient is on Osmolite at 55 mL an hour  Acute encephalopathy - Obtain stat CT head, EEG, serial neuro checks, - Hold narcotics, Neurontin, Wellbutrin  DVT prophylaxis:  SCD's until CT head results  CODE STATUS: Per transfer records, DO NOT RESUSCITATE status  Family Communication: No family member is present at the bedside.   Further plan will depend as patient's clinical course evolves and further radiologic and laboratory data become available.   Time Spent on Admission: 1 hour  RAI,RIPUDEEP M.D. [facility] 2014/01/21, 1:20 PM Pager: [phone]  If 7PM-7AM, please contact night-coverage [email or web address] Password [email or web address]

## 2014-01-01 NOTE — ED Notes (Signed)
PT o2 @ 87 on 15L Non re breather MD made aware.

## 2014-01-01 NOTE — ED Notes (Addendum)
Pt from facility. PER ems PT was sweating and pale on their arrival.  Pt has alerted mental status x3 days. Pt paced with EMS on arrival at rate of 70 at . PT Received  .5 atropine 2 glocgacal. 50 lidocain , of NS. IO Lt tibula.  Pt has DNR

## 2014-01-01 NOTE — ED Notes (Signed)
Tim Short, [place] RN saw and spoke with 3W regarding pt and transfer.

## 2014-01-01 NOTE — Progress Notes (Signed)
ANTIBIOTIC CONSULT NOTE - INITIAL  Pharmacy Consult for Vancomycin/Zosyn Indication: HCAP  Allergies  Allergen Reactions  . Corticosteroids     Inhaled Corticosteroids--Hoarseness, dry mouth  . Furosemide Other (See Comments)    Discussed with son 4/15, mild reaction (rash?) with oral formulation only    Patient Measurements: Weight:  192 pounds (87.3kg) (on 03/2013) Height:  68.9 inches (on 03/2013)  Vital Signs: BP: 91/62 mmHg (12/20 1130) Pulse Rate: 44 (12/20 1130)  Labs:  Recent Labs  2013/09/01 1050 2013/09/01 1100  WBC 9.7  --   HGB 7.3* 8.2*  PLT 114*  --   CREATININE  --  0.80   Medical History: Past Medical History  Diagnosis Date  . Hypertension   . Hypercholesteremia   . Asthma   . Meniere disease   . Persistent atrial fibrillation     a. s/p multiple dccv's;  b. failed amio;  c. not felt to be AF RFCA canddiate due to LA dil;  d. s/p failed hybrid ablation @ [facility] in 09/2012;  e. chronic pradaxa.  . Obesity   . Biatrial enlargement     LA size 5.3cm  . Obstructive sleep apnea     AHI 108/hr now on CPAP at 12cm H2O  . H/O hiatal hernia   . GERD (gastroesophageal reflux disease)     "associated w/hiatal hernia" (06/04/2012)  . Peptic ulcer 1950's  . Migraines     "haven't had one for about 15 years" (06/04/2012)  . Arthritis     "joints" (06/04/2012)  . Chronic lower back pain   . Nephrolithiasis ~ 1960's    "passed on their own" (06/04/2012)  . History of pneumonia 2002; 2006    "spent 8 days in isolation; Norovirus" (06/04/2012)  . Other and unspecified angina pectoris   . RLS (restless legs syndrome)   . Coronary artery disease     a. 05/2012 Cath/PCI: LM nl, LAD nl, LCX 7865m (3.0x15 Integrity BMS), PTCA of OM1 through stent struts (kissing balloon).  . Diabetes mellitus without complication     FAMILY STATES PATIENT IS NOT DIABETIC   Medications:  Anti-infectives    Start     Dose/Rate Route Frequency Ordered Stop   2013/09/01 1115  ceFEPIme  (MAXIPIME) 2 g in dextrose 5 % 50 mL IVPB     2 g100 mL/hr over 30 Minutes Intravenous  Once 2013/09/01 1106     2013/09/01 1115  vancomycin (VANCOCIN) IVPB 1000 mg/200 mL premix     1,000 mg200 mL/hr over 60 Minutes Intravenous  Once 2013/09/01 1106       Assessment: 79 yo male admitted from a nursing home facility with AMS, bradycardia and possible sepsis.  He has a complex medical history and in 2014 pneumonia with norovirus.  We have been asked to start IV Vancomycin and Cefepime for possible HCAP.  He has a lactic acid of 1.58 which is within the reference range.  He has a normal renal function and and estimated crcl ~ 2585ml/min.  Goal of Therapy:  Vancomycin trough 15-20 mcg/ml  Plan:  1.  Vancomycin 1gm and Cefepime 2gm IV already ordered. 2.  Begin maintenance of Vancomycin 1 gm every 8 hours 3.  Begin maintenance of Cefepime 1 gm every 8 hours 4.  Monitor renal function, clinical response and s/s levels as appropriate.  Tim Short, PharmD., MS Clinical Pharmacist Pager:  [phone] Thank you for allowing pharmacy to be part of this patients care team. 10-07-2013,11:53 AM

## 2014-01-01 NOTE — ED Notes (Signed)
Pt O2 sat 87% on 10L Non-rebreather. Placed patient on 15L. No change. MD, Gwendolyn GrantWalden informed.

## 2014-01-01 NOTE — ED Notes (Signed)
Per AlexanderPete with critical care dc head ct. Ct made aware

## 2014-01-01 NOTE — Consult Note (Addendum)
Name: Tim Short MRN: [id number] DOB: December 10, 1934    ADMISSION DATE:  January 11, 2014 CONSULTATION DATE:  12/20  REFERRING MD :  RAI  CHIEF COMPLAINT:  Acute encephalopathy   BRIEF PATIENT DESCRIPTION:  This is a 79 year old male who resides at an SNF. Has had prior prolonged VDRF required trach and was de cannulated in June 2015. Has had on-going dysphagia and prob early dementia. Admitted on 12/20 from SNF when found unresponsive, hypoxic and bradycardic in setting of probable aspiration PNA. Placed on NIPPV in the ER for hypoxia. PCCM asked to assist w/ care.   SIGNIFICANT EVENTS  12/20 long discussion w/ Son. Onalee Hua her POA [phone]. Goals of care re-confirmed. DNR. Plan to dc BIPAP   STUDIES:     HISTORY OF PRESENT ILLNESS:    This is an elderly 79 year old male who has been in very poor health over the last year. He was on vent for prolonged time, spent time in [facility] and also [facility]. He had a trach which was removed in June this year. Has continued to have dysphagia and son raising concern about early dementia. Presents to ER on 12/20 w/ 3 days of progressive confusion, weakness and increased work of breathing. He was found on 12/20 unresponsive, RR 40s, O2 sats low and HR in 40s. He was transferred via EMS paced. Has he remained w/ sats in low 80s on  High flow oxygen he was placed on NIPPV. He was admitted by the medical service. PCCM asked to consult. Called his son Onalee Hua who is his HCOPA. Confirmed goals of care w/ son. Do not think that he is a good candidate for NIPPV given aspiration risk.   PAST MEDICAL HISTORY :   has a past medical history of Hypertension; Hypercholesteremia; Asthma; Meniere disease; Persistent atrial fibrillation; Obesity; Biatrial enlargement; Obstructive sleep apnea; H/O hiatal hernia; GERD (gastroesophageal reflux disease); Peptic ulcer (1950's); Migraines; Arthritis; Chronic lower back pain; Nephrolithiasis (~ 1960's); History of pneumonia (2002; 2006);  Other and unspecified angina pectoris; RLS (restless legs syndrome); Coronary artery disease; Diabetes mellitus without complication; and CHF (congestive heart failure) (11-Jan-2014).  has past surgical history that includes Wrist fracture surgery (1992); Total knee arthroplasty (08/19/1999); Colonoscopy w/ polypectomy (2006); Knee arthroscopy with meniscal repair (Right, 1974); Cardioversion (10/02/2011); Cardioversion (11/07/2011); Cardiac catheterization (05/26/2012); Coronary angioplasty with stent (06/04/2012); Cataract extraction w/ intraocular lens  implant, bilateral (2009); Hemorrhoid surgery (~ 2003); Hiatal hernia repair (1983); Nissen fundoplication (1983); Ablation of dysrhythmic focus (SEPT/OCT 2014); Inguinal hernia repair (Bilateral, 2000 and 2005); Colon surgery (10/08/2012 ); Tracheostomy (OCT 2014); Cardioversion (N/A, 04/08/2013); and percutaneous coronary stent intervention (pci-s) (N/A, 06/04/2012). Prior to Admission medications   Medication Sig Start Date End Date Taking? Authorizing Provider  amiodarone (PACERONE) 100 MG tablet Give 100 mg by tube daily.   Yes Historical Provider, MD  atorvastatin (LIPITOR) 40 MG tablet Give 40 mg by tube daily.    Yes Historical Provider, MD  buPROPion (WELLBUTRIN XL) 150 MG 24 hr tablet Take 150 mg by mouth daily.   Yes Historical Provider, MD  gabapentin (NEURONTIN) 600 MG tablet Give 300 mg by tube at bedtime.   Yes Historical Provider, MD  HYDROcodone-acetaminophen (NORCO/VICODIN) 5-325 MG per tablet Give 1 tablet by tube every 8 (eight) hours.  05/25/13  Yes Historical Provider, MD  Lactobacillus Rhamnosus, GG, (CULTURELLE PO) Give 1 tablet by tube 2 (two) times daily.   Yes Historical Provider, MD  meclizine (ANTIVERT) 25 MG tablet Give 25 mg  by tube every 8 (eight) hours.  05/17/13  Yes Historical Provider, MD  Melatonin 3 MG CAPS Give 1 capsule by tube daily.   Yes Historical Provider, MD  metoprolol tartrate (LOPRESSOR) 25 MG tablet Take 1 tablet  (25 mg total) by mouth 2 (two) times daily. Patient taking differently: Give 25 mg by tube every 6 (six) hours as needed (pulse >110 and systolic BP greater than 100).  05/04/13  Yes Vilinda BlanksWilliam S Minor, NP  naphazoline (NAPHCON) 0.1 % ophthalmic solution Place 1 drop into both eyes 4 (four) times daily as needed for irritation. 05/04/13  Yes Vilinda BlanksWilliam S Minor, NP  Nutritional Supplements (FEEDING SUPPLEMENT, OSMOLITE 1.5 CAL,) LIQD Place 1,000 mLs into feeding tube continuous. 05/04/13  Yes Vilinda BlanksWilliam S Minor, NP  paroxetine (PAXIL) 10 MG/5ML suspension Give 30 mg by tube daily.   Yes Historical Provider, MD  promethazine (PHENERGAN) 25 MG tablet Give 25 mg by tube every 6 (six) hours as needed for nausea or vomiting.   Yes Historical Provider, MD  rivaroxaban (XARELTO) 20 MG TABS tablet Take 1 tablet (20 mg total) by mouth daily with supper. Patient taking differently: Give 20 mg by tube daily with supper.  05/04/13  Yes Vilinda BlanksWilliam S Minor, NP  acetaminophen (TYLENOL) 160 MG/5ML solution Take 15.6 mLs (500 mg total) by mouth every 6 (six) hours as needed for mild pain, headache or fever. Patient taking differently: Place 500 mg into feeding tube every 6 (six) hours as needed for mild pain, headache or fever.  05/04/13   Vilinda BlanksWilliam S Minor, NP  Amino Acids-Protein Hydrolys (FEEDING SUPPLEMENT, PRO-STAT SUGAR FREE 64,) LIQD Place 30 mLs into feeding tube 2 (two) times daily at 10 AM and 5 PM. Patient not taking: Reported on 01/07/2014 05/04/13   Vilinda BlanksWilliam S Minor, NP  amiodarone (PACERONE) 200 MG tablet Take 1 tablet (200 mg total) by mouth 2 (two) times daily. Patient not taking: Reported on 12/26/2013 05/04/13   Vilinda BlanksWilliam S Minor, NP  dextrose 5 % solution Inject 50 mLs into the vein continuous. Patient not taking: Reported on 01/05/2014 05/04/13   Vilinda BlanksWilliam S Minor, NP  diltiazem (CARDIZEM) 120 MG tablet Take 1 tablet (120 mg total) by mouth 4 (four) times daily -  with meals and at bedtime. Patient not taking: Reported on  12/24/2013 05/04/13   Vilinda BlanksWilliam S Minor, NP  insulin aspart (NOVOLOG) 100 UNIT/ML injection Inject 0-15 Units into the skin every 4 (four) hours. Patient not taking: Reported on 12/31/2013 05/04/13   Vilinda BlanksWilliam S Minor, NP  ipratropium-albuterol (DUONEB) 0.5-2.5 (3) MG/3ML SOLN Take 3 mLs by nebulization every 4 (four) hours as needed.  05/04/13   Historical Provider, MD  levalbuterol Pauline Aus(XOPENEX) 0.63 MG/3ML nebulizer solution Take 3 mLs (0.63 mg total) by nebulization every 6 (six) hours as needed for wheezing or shortness of breath. 05/04/13   Vilinda BlanksWilliam S Minor, NP  metoprolol (LOPRESSOR) 1 MG/ML injection Inject 10 mLs (10 mg total) into the vein every 4 (four) hours as needed (give for HR > 110- hold and call MD if SBP less than 90). Patient not taking: Reported on 12/14/2013 05/04/13   Vilinda BlanksWilliam S Minor, NP  nitroGLYCERIN (NITROSTAT) 0.4 MG SL tablet Place 0.4 mg under the tongue every 5 (five) minutes as needed for chest pain.    Historical Provider, MD  ondansetron (ZOFRAN) 4 MG/2ML SOLN injection Inject 2 mLs (4 mg total) into the vein every 6 (six) hours as needed for nausea. 05/04/13   Vilinda BlanksWilliam S Minor, NP  simethicone (MYLICON) 40 MG/0.6ML drops Take 0.6 mLs (40 mg total) by mouth 4 (four) times daily as needed for flatulence. 05/04/13   Vilinda Blanks Minor, NP   Allergies  Allergen Reactions  . Corticosteroids     Inhaled Corticosteroids--Hoarseness, dry mouth  . Furosemide Other (See Comments)    Discussed with son 4/15, mild reaction (rash?) with oral formulation only     FAMILY HISTORY:  family history includes COPD in his father; CVA in his brother and brother; Congestive Heart Failure in an other family member; Heart attack in his brother; Heart disease in his brother and brother; Heart failure in his brother and sister. SOCIAL HISTORY:  reports that he quit smoking about 32 years ago. His smoking use included Pipe. He has never used smokeless tobacco. He reports that he does not drink alcohol or  use illicit drugs.  REVIEW OF SYSTEMS:   Unable   SUBJECTIVE:  Increased rr effort on NIPPV  VITAL SIGNS: Pulse Rate:  [44-67] 63 (12/20 1400) Resp:  [15-27] 26 (12/20 1400) BP: (82-130)/(49-88) 117/53 mmHg (12/20 1400) SpO2:  [86 %-100 %] 89 % (12/20 1400) FiO2 (%):  [100 %] 100 % (12/20 1252) Weight:  [87.3 kg (192 lb 7.4 oz)] 87.3 kg (192 lb 7.4 oz) (12/20 1130)  PHYSICAL EXAMINATION: General:  79 year old male, marked accessory muscle use w/ paradoxical resp efforts.  Neuro:  Minimally responsive. Generalized weakness  HEENT:  MM dry no clear JVD. Cardiovascular:  Reg irreg  Lungs:  Decreased w/ marked accessory muscle use  Abdomen:  Soft, non-tender + bowel sounds  Musculoskeletal:  Intact  Skin:  Generalized anasarca    Recent Labs Lab 12/20/2013 1050 01/11/2014 1100  NA 137 134*  K 4.7 4.8  CL 95* 91*  CO2 37*  --   BUN 25* 32*  CREATININE 0.58 0.80  GLUCOSE 133* 127*    Recent Labs Lab 01/09/2014 1050 01/02/2014 1100  HGB 7.3* 8.2*  HCT 25.9* 24.0*  WBC 9.7  --   PLT 114*  --    Dg Chest Portable 1 View  12/30/2013   CLINICAL DATA:  Bradycardia  EXAM: PORTABLE CHEST - 1 VIEW  COMPARISON:  05/04/2013  FINDINGS: There is bilateral interstitial and alveolar airspace opacities. There is a small left pleural effusion. There is no pneumothorax. There is stable cardiomegaly. There is no acute osseus abnormality.  IMPRESSION: Overall findings concerning for CHF.   Electronically Signed   By: Elige Ko   On: 01/09/2014 11:53    ASSESSMENT / PLAN:  Acute Hypoxic Respiratory Failure  Aspiration Pneumonia  Dysphagia  Acute Encephalopathy  Hypoxia related bradycardia  H/o atrial fib  FTT Anemia of Chronic disease  Discussion  This is an elderly 79 year old male who has been in very poor health over the last year. He was on vent for prolonged time, spent time in [facility] and also [facility]. He had a trach which was removed in June this year. Has continued to have  dysphagia and son raising concern about early dementia. Presents to ER today w/ what is most likely aspiration PNA and acute hypoxic respiratory failure. He has DNR on file. Confirmed goals of care w/ son. Do not think that he is a good candidate for NIPPV given aspiration risk.   Plan/rec Cont BIPAP until son arrives then consider pros/cons of its use in pt with impaired airway protection Full DNR, confirmed with his son on arrival 12/20. He understands that his father 87  not survive this illness.  Oxygen  IVFs and empiric antibiotics Will try placing a nasal trumpet to allow suctioning, see if this allows some improvement   Simonne MartinetPeter E Babcock ACNP-BC [facility] Pager # [phone] OR # [phone] if no answer 12/31/2013, 2:50 PM   Attending Note:  I have examined patient, reviewed labs, studies and notes. I have discussed the case with Kreg ShropshireP Babcock and I agree with the data and plans as amended above. Pt is obtunded and managing secretions poorly on BiPAP on our first evaluation. He is a bit more awake on my follow up examination. Would favor avoiding BiPAP if possible given his poor airway protection. Will try to suction via nasal trumpet to see if we can buy some time for abx to take effect. Would not escalate his care from here - intubation and ventilation would likely commit him to lifelong institutionalization. His son agrees that pt would not want this. Independent CC time 60 minutes.  Levy Pupaobert Byrum, MD, PhD 01/05/2014, 4:34 PM [place] [facility] [phone] or if no answer [phone]

## 2014-01-01 NOTE — ED Notes (Signed)
1000 ML warm fluids  in the IO Per Dr. Gwendolyn GrantWalden

## 2014-01-01 NOTE — ED Notes (Addendum)
Pt remains on NRB on 10L. Pt not paced at this time. Pt responds to voice. Pt given warm blankets. Will continue to monitor. Lab at bedside

## 2014-01-01 NOTE — ED Notes (Signed)
Respiratory called for BiPap

## 2014-01-01 NOTE — ED Notes (Signed)
Pacing currently stop per DR. Gwendolyn GrantWalden.

## 2014-01-02 ENCOUNTER — Inpatient Hospital Stay (HOSPITAL_COMMUNITY): Payer: Medicare HMO

## 2014-01-02 DIAGNOSIS — I059 Rheumatic mitral valve disease, unspecified: Secondary | ICD-10-CM

## 2014-01-02 DIAGNOSIS — J9602 Acute respiratory failure with hypercapnia: Secondary | ICD-10-CM

## 2014-01-02 DIAGNOSIS — I509 Heart failure, unspecified: Secondary | ICD-10-CM | POA: Insufficient documentation

## 2014-01-02 DIAGNOSIS — I5031 Acute diastolic (congestive) heart failure: Secondary | ICD-10-CM

## 2014-01-02 LAB — BASIC METABOLIC PANEL
ANION GAP: 4 — AB (ref 5–15)
BUN: 28 mg/dL — ABNORMAL HIGH (ref 6–23)
CALCIUM: 9.1 mg/dL (ref 8.4–10.5)
CHLORIDE: 94 meq/L — AB (ref 96–112)
CO2: 41 mEq/L (ref 19–32)
Creatinine, Ser: 0.67 mg/dL (ref 0.50–1.35)
GFR calc non Af Amer: >90 mL/min (ref >90)
Glucose, Bld: 109 mg/dL — ABNORMAL HIGH (ref 70–99)
Potassium: 4 mEq/L (ref 3.7–5.3)
SODIUM: 139 meq/L (ref 137–147)

## 2014-01-02 LAB — GLUCOSE, CAPILLARY
GLUCOSE-CAPILLARY: 111 mg/dL — AB (ref 70–99)
GLUCOSE-CAPILLARY: 181 mg/dL — AB (ref 70–99)
Glucose-Capillary: 123 mg/dL — ABNORMAL HIGH (ref 70–99)
Glucose-Capillary: 134 mg/dL — ABNORMAL HIGH (ref 70–99)
Glucose-Capillary: 149 mg/dL — ABNORMAL HIGH (ref 70–99)
Glucose-Capillary: 171 mg/dL — ABNORMAL HIGH (ref 70–99)

## 2014-01-02 LAB — URINE CULTURE
Colony Count: NO GROWTH
Culture: NO GROWTH
Special Requests: NORMAL

## 2014-01-02 MED ORDER — CETYLPYRIDINIUM CHLORIDE 0.05 % MT LIQD
7.0000 mL | Freq: Two times a day (BID) | OROMUCOSAL | Status: DC
Start: 2014-01-02 — End: 2014-01-03
  Administered 2014-01-02 – 2014-01-03 (×2): 7 mL via OROMUCOSAL

## 2014-01-02 MED ORDER — INSULIN ASPART 100 UNIT/ML ~~LOC~~ SOLN
0.0000 [IU] | Freq: Four times a day (QID) | SUBCUTANEOUS | Status: DC
  Administered 2014-01-02: 1 [IU] via SUBCUTANEOUS
  Administered 2014-01-03 (×3): 2 [IU] via SUBCUTANEOUS

## 2014-01-02 MED ORDER — MORPHINE SULFATE 2 MG/ML IJ SOLN
0.5000 mg | Freq: Once | INTRAMUSCULAR | Status: AC
  Administered 2014-01-03: 0.5 mg via INTRAVENOUS
  Filled 2014-01-02: qty 1

## 2014-01-02 MED ORDER — FREE WATER
250.0000 mL | Freq: Four times a day (QID) | Status: DC
  Administered 2014-01-02: 250 mL

## 2014-01-02 NOTE — Progress Notes (Signed)
PT Cancellation Note  Patient Details Name: Rush BarerJames B Hang MRN: [id number] DOB: 10/27/1934   Cancelled Treatment:    Reason Eval/Treat Not Completed: Patient not medically ready.  Spoke with RN who indicates pt desating on Ventimask and request hold PT until tomorrow.  Will check back as appropriate.     Tracie Dore, Alison MurrayMegan F 01/02/2014, 2:09 PM

## 2014-01-02 NOTE — Progress Notes (Signed)
Echocardiogram 2D Echocardiogram has been performed.  Tim Short, Tim Short 01/02/2014, 12:40 PM

## 2014-01-02 NOTE — Progress Notes (Signed)
Pulmonary MD, came by to see patient. Recommended nursing staff to wean pt to Venti mask 50%. Md instructed RN to keep sats 90 or greater. Will continue to monitor. Cecille Rubinhompson,Kamoria Lucien V, RN

## 2014-01-02 NOTE — Progress Notes (Signed)
Nurse called by lab, reported patient to have positive blood cultures, gram positive cocci with clusters. Dr. Pixie CasinoJ.Kim notified, no new orders given. Will continue to monitor patient.

## 2014-01-02 NOTE — Progress Notes (Signed)
[facility] is [facility]. RN tried calling this afternoon to ask about PNA vaccine. There was no answer. There are two direct numbers to try tomorrow.  [phone]

## 2014-01-02 NOTE — Progress Notes (Signed)
CRITICAL VALUE ALERT  Critical value received:  CO2 41  Date of notification:  01/02/14  Time of notification:  0550 Critical value read back:Yes.    Nurse who received alert:  Westley ChandlerJill Oliver Neuwirth, RN     MD notified (1st page):  S. Camila Lisman   Time of first page:  [phone]  MD notified (2nd page):  Time of second page:  Responding MD:  Camila Lisman   Time MD responded:  [phone]

## 2014-01-02 NOTE — Clinical Social Work Psychosocial (Signed)
     Clinical Social Work Department BRIEF PSYCHOSOCIAL ASSESSMENT 01/02/2014  Patient:  Tim Short, Tim Short     Account Number:  [id number]     Admit date:  01/10/2014  Clinical Social Worker:  Adair Laundry  Date/Time:  01/02/2014 10:35 AM  Referred by:  Physician  Date Referred:  01/02/2014 Referred for  SNF Placement   Other Referral:   Interview type:  Family Other interview type:   Spoke with pt son Tim Short    PSYCHOSOCIAL DATA Living Status:  FACILITY Admitted from facility:  [place] Level of care:  [place] Primary support name:  Tim Short Primary support relationship to patient:  CHILD, ADULT Degree of support available:   Pt has strong family support    CURRENT CONCERNS Current Concerns  Post-Acute Placement   Other Concerns:    SOCIAL WORK ASSESSMENT / PLAN CSW notified pt was admitted from facility. CSW met with pt son Tim Short to discuss. Tim Short informed CSW that pt has had numerous health issues and has been bouncing between facilities for about 15 months. [facility] is currently [facility] and plan is for [facility] to [facility]. Tim Short is an Copywriter, advertising and expressed that he is familiar with nursing home process, bed holds, etc... CSW explained CSW role and Tim Short thankful for CSW involvement. Tim Short expressed most important thing is that hospital staff have access to his cell phone number. CSW confirmed phone number is listed in pt chart. Tim Short informed CSW he has already spoken with facility and is planning on holding a bed. CSW did also confirm this with facility. Pt son responses, emotion, and actions appropriate. Tim Short expressed no concerns or questions for CSW at this time.   Assessment/plan status:  Psychosocial Support/Ongoing Assessment of Needs Other assessment/ plan:   Information/referral to community resources:   None needed    PATIENTS/FAMILYS RESPONSE TO PLAN OF CARE: Pt son pleasant and  coopeartive. Plan will be for pt to return to SNF when medically stable.    [place], [place]

## 2014-01-02 NOTE — Progress Notes (Signed)
Patient Demographics  Tim Short, is a 79 y.o. male, DOB - 06/18/1934, ZOX:[id number]  Admit date - 01/08/2014   Admitting Physician Ripudeep Jenna LuoK Rai, MD  Outpatient Primary MD for the patient is  Duane Lopeoss, Alan, MD  LOS - 1   Chief Complaint  Patient presents with  . Altered Mental Status  . Bradycardia  . Code Sepsis      Admission history of present illness/brief narrative:  Patient is a 79 year old male with hypertension and hyperlipidemia, atrial fibrillation on xarelto, diabetes mellitus, CAD, GERD, obstructive sleep apnea, chronic respiratory failure, PEG tube placement was brought from the skilled nursing facility with altered mental status.  Patient is also on amiodarone, Metoprolol and Cardizem for A. fib prior to admission, BP was 82/49 with heart rate of 45 respiratory rate 27 at the time of triage. Venous ABG showed a pH of 7.2 with PCO2 of 92, patient was placed on BiPAP in ED Chest x-ray showed findings concerning for CHF, bilateral interstitial and alveolar airspace opacities, no pneumothorax Patient was seen by pulmonary critical care, patient is DO NOT RESUSCITATE, started on broad-spectrum IV antibiotic for HCAP, and an IV Lasix for CHF.  Subjective:   Tim Short sleeping comfortably, has no complaints.  Assessment & Plan    Principal Problem:   Acute respiratory failure Active Problems:   Essential hypertension   CAD (coronary artery disease)   Chronic respiratory failure   HCAP (healthcare-associated pneumonia)   CHF (congestive heart failure)   Bradycardia   Acute encephalopathy  Acute respiratory failure, hypoxic/hypercarbic: - Hypoxic secondary to pneumonia and acute CHF. -Pulmonary consult appreciated, will try to avoid BiPAP giving aspiration risk, currently on 100% nonrebreather bag. -We'll start chest PT, flutter valve,  Mucinex, nebs as needed.  Acute CHF: -Most recent echo in 2013 showing EF 55%, will repeat echo. Has elevated BNP. -Daily weights, strict ins and outs, continue with IV Lasix.  Healthcare acquired pneumonia -Continue with IV vancomycin, cefepime, bronchodilators. Follow on Legionella antigen, negative urine strep antigen, follow on blood cultures.  Acute encephalopathy -Most likely metabolic given his pneumonia, as well secondary to hypercarbia as evident on ABG. -Appears to be more alert and communicative today. -Follow on CT head and EEG.  atrial fibrillation - Had bradycardia on admission, and soft blood pressure, so Cardizem and beta blockers are being held. - Resume Xarelto if CT head showing no bleed.  Dysphagia - Continue tube feeds, patient is on Osmolite at 55 mL an hour   Code Status: DO NOT RESUSCITATE  Family Communication: Discussed with son  Disposition Plan; remains on telemetry   Procedures  None   Consults   - PCCM   Medications  Scheduled Meds: . ceFEPime (MAXIPIME) IV  1 g Intravenous 3 times per day  . Chlorhexidine Gluconate Cloth  6 each Topical Q0600  . furosemide  40 mg Intravenous Daily  . sodium chloride  3 mL Intravenous Q12H  . vancomycin  1,000 mg Intravenous Q8H   Continuous Infusions: . feeding supplement (OSMOLITE 1.2 CAL) 1,000 mL (01/05/2014 2058)   PRN Meds:.sodium chloride, acetaminophen, ondansetron (ZOFRAN) IV, sodium chloride  DVT Prophylaxis  will resume Xarelto CT head is negative.  Lab Results  Component Value Date   PLT  114* 12/20/2013    Antibiotics    Anti-infectives    Start     Dose/Rate Route Frequency Ordered Stop   12/26/2013 2100  vancomycin (VANCOCIN) IVPB 1000 mg/200 mL premix     1,000 mg200 mL/hr over 60 Minutes Intravenous Every 8 hours 01/05/2014 1211     01/05/2014 2000  ceFEPIme (MAXIPIME) 1 g in dextrose 5 % 50 mL IVPB     1 g100 mL/hr over 30 Minutes Intravenous 3 times per day 12/30/2013 1211      01/05/2014 1115  ceFEPIme (MAXIPIME) 2 g in dextrose 5 % 50 mL IVPB     2 g100 mL/hr over 30 Minutes Intravenous  Once 12/24/2013 1106 12/16/2013 1251   12/14/2013 1115  vancomycin (VANCOCIN) IVPB 1000 mg/200 mL premix     1,000 mg200 mL/hr over 60 Minutes Intravenous  Once 01/07/2014 1106 12/30/2013 1341          Objective:   Filed Vitals:   12/19/2013 2354 01/02/14 0400 01/02/14 0754 01/02/14 0834  BP: 104/47 107/56 142/76   Pulse: 68 72 86   Temp: 97.7 F (36.5 C) 99.2 F (37.3 C)  98.6 F (37 C)  TempSrc: Oral Oral  Oral  Resp: 27 24 27    Height:      Weight:  87.1 kg (192 lb 0.3 oz)    SpO2: 100% 99% 94%     Wt Readings from Last 3 Encounters:  01/02/14 87.1 kg (192 lb 0.3 oz)  05/04/13 89 kg (196 lb 3.4 oz)  06/24/12 111 kg (244 lb 11.4 oz)     Intake/Output Summary (Last 24 hours) at 01/02/14 1036 Last data filed at 01/02/14 0800  Gross per 24 hour  Intake 954.67 ml  Output    900 ml  Net  54.67 ml     Physical Exam  Awake Alert, communicative, confused, on nonrebreather bag  Welcome.AT,PERRAL Supple Neck,No JVD, No cervical lymphadenopathy appriciated.  Symmetrical Chest wall movement, Good air movement bilaterally, no wheezing RRR,No Gallops,Rubs or new Murmurs, No Parasternal Heave +ve B.Sounds, Abd Soft, No tenderness, No organomegaly appriciated, No rebound - guarding or rigidity. No Cyanosis, Clubbing ,No new Rash or bruise  +1-2 edema.   Data Review   Micro Results Recent Results (from the past 240 hour(s))  MRSA PCR Screening     Status: None   Collection Time: 01/11/2014  3:46 PM  Result Value Ref Range Status   MRSA by PCR NEGATIVE NEGATIVE Final    Comment:        The GeneXpert MRSA Assay (FDA approved for NASAL specimens only), is one component of a comprehensive MRSA colonization surveillance program. It is not intended to diagnose MRSA infection nor to guide or monitor treatment for MRSA infections.     Radiology Reports Dg Chest Portable 1  View  12/22/2013   CLINICAL DATA:  Bradycardia  EXAM: PORTABLE CHEST - 1 VIEW  COMPARISON:  05/04/2013  FINDINGS: There is bilateral interstitial and alveolar airspace opacities. There is a small left pleural effusion. There is no pneumothorax. There is stable cardiomegaly. There is no acute osseus abnormality.  IMPRESSION: Overall findings concerning for CHF.   Electronically Signed   By: Elige Ko   On: 12/20/2013 11:53    CBC  Recent Labs Lab 01/11/2014 1050 01/12/2014 1100  WBC 9.7  --   HGB 7.3* 8.2*  HCT 25.9* 24.0*  PLT 114*  --   MCV 96.3  --   MCH 27.1  --  MCHC 28.2*  --   RDW 17.2*  --   LYMPHSABS 1.2  --   MONOABS 1.1*  --   EOSABS 0.0  --   BASOSABS 0.0  --     Chemistries   Recent Labs Lab 05-27-2013 1050 05-27-2013 1100 01/02/14 0355  NA 137 134* 139  K 4.7 4.8 4.0  CL 95* 91* 94*  CO2 37*  --  41*  GLUCOSE 133* 127* 109*  BUN 25* 32* 28*  CREATININE 0.58 0.80 0.67  CALCIUM 8.5  --  9.1  AST 16  --   --   ALT 13  --   --   ALKPHOS 55  --   --   BILITOT 0.4  --   --    ------------------------------------------------------------------------------------------------------------------ estimated creatinine clearance is 84.5 mL/min (by C-G formula based on Cr of 0.67). ------------------------------------------------------------------------------------------------------------------ No results for input(s): HGBA1C in the last 72 hours. ------------------------------------------------------------------------------------------------------------------ No results for input(s): CHOL, HDL, LDLCALC, TRIG, CHOLHDL, LDLDIRECT in the last 72 hours. ------------------------------------------------------------------------------------------------------------------ No results for input(s): TSH, T4TOTAL, T3FREE, THYROIDAB in the last 72 hours.  Invalid input(s):  FREET3 ------------------------------------------------------------------------------------------------------------------ No results for input(s): VITAMINB12, FOLATE, FERRITIN, TIBC, IRON, RETICCTPCT in the last 72 hours.  Coagulation profile  Recent Labs Lab 05-27-2013 1050  INR 1.75*    No results for input(s): DDIMER in the last 72 hours.  Cardiac Enzymes  Recent Labs Lab 05-27-2013 1050  TROPONINI <0.30   ------------------------------------------------------------------------------------------------------------------ Invalid input(s): POCBNP     Time Spent in minutes   30 minutes   Lene Mckay M.D on 01/02/2014 at 10:36 AM  Between 7am to 7pm - Pager - [phone]  After 7pm go to [email or web address] - password [email or web address]  And look for the night coverage person covering for me after hours  [facility] Office  [phone]   **Disclaimer: This note may have been dictated with voice recognition software. Similar sounding words can inadvertently be transcribed and this note may contain transcription errors which may not have been corrected upon publication of note.**

## 2014-01-02 NOTE — Progress Notes (Signed)
Name: Tim BarerJames B Portell MRN: [id number] DOB: 11/14/1934    ADMISSION DATE:  12/26/2013 CONSULTATION DATE:  12/20  REFERRING MD :  RAI  CHIEF COMPLAINT:  Acute encephalopathy   BRIEF PATIENT DESCRIPTION:  This is a 79 year old male who resides at an SNF. Has had prior prolonged VDRF required trach and was de cannulated in June 2015. Has had on-going dysphagia and prob early dementia. Admitted on 12/20 from SNF when found unresponsive, hypoxic and bradycardic in setting of probable aspiration PNA. Placed on NIPPV in the ER for hypoxia. PCCM asked to assist w/ care.   SIGNIFICANT EVENTS  12/20 long discussion w/ Son. Onalee HuaDavid her POA [phone]. Goals of care re-confirmed. DNR.  dc BIPAP   STUDIES:  CT head 12/21 >>> Echo 12/21 >>>  SUBJECTIVE:  States that he is breathing better today and feels "so/so"  VITAL SIGNS: Temp:  [97.7 F (36.5 C)-99.2 F (37.3 C)] 98.6 F (37 C) (12/21 0834) Pulse Rate:  [44-86] 86 (12/21 0754) Resp:  [22-27] 27 (12/21 0754) BP: (91-142)/(47-88) 142/76 mmHg (12/21 0754) SpO2:  [86 %-100 %] 94 % (12/21 0754) FiO2 (%):  [100 %] 100 % (12/20 1956) Weight:  [87 kg (191 lb 12.8 oz)-87.3 kg (192 lb 7.4 oz)] 87.1 kg (192 lb 0.3 oz) (12/21 0400)  PHYSICAL EXAMINATION: General:  Elderly male in mild respiratory distress.  Neuro:  Alert to verbal, confused. Very hard of hearing HEENT:  MM dry no clear JVD. Cardiovascular:  Reg irreg  Lungs:  Tachypnea, mildly labored. O2 sat 94% on 100% NRB Abdomen:  Soft, non-tender + bowel sounds  Musculoskeletal:  Intact  Skin:  Generalized anasarca    Recent Labs Lab 01/11/2014 1050 12/19/2013 1100 01/02/14 0355  NA 137 134* 139  K 4.7 4.8 4.0  CL 95* 91* 94*  CO2 37*  --  41*  BUN 25* 32* 28*  CREATININE 0.58 0.80 0.67  GLUCOSE 133* 127* 109*    Recent Labs Lab 12/14/2013 1050 12/30/2013 1100  HGB 7.3* 8.2*  HCT 25.9* 24.0*  WBC 9.7  --   PLT 114*  --    Dg Chest Portable 1 View  01/04/2014   CLINICAL  DATA:  Bradycardia  EXAM: PORTABLE CHEST - 1 VIEW  COMPARISON:  05/04/2013  FINDINGS: There is bilateral interstitial and alveolar airspace opacities. There is a small left pleural effusion. There is no pneumothorax. There is stable cardiomegaly. There is no acute osseus abnormality.  IMPRESSION: Overall findings concerning for CHF.   Electronically Signed   By: Elige KoHetal  Patel   On: 01/10/2014 11:53    ASSESSMENT / PLAN:  Acute Hypoxic Respiratory Failure  Aspiration Pneumonia  Pulmonary edema 2/2 acute CHF Dysphagia  Acute Encephalopathy  Hypoxia related bradycardia  H/o atrial fib  FTT Anemia of Chronic disease  Discussion  This is an elderly 79 year old male who has been in very poor health over the last year. [facility] was [facility] for prolonged [facility] He had a trach which was removed in June this year. Has continued to have dysphagia and son raising concern about early dementia. Presents to ER  w/ what is most likely aspiration PNA and acute hypoxic respiratory failure. He has DNR on file. Confirmed goals of care w/ son. Do not think that he is a good candidate for NIPPV given aspiration risk.   Plan/rec Full DNR, confirmed with his son on arrival 12/20. He understands that his father  may not survive this illness.  Supplemental O2 to maintain SpO2 greater than 92%  Continue empiric antibiotics Diuresis per primary Follow Echo, CT head Repeat CXR in AM Strict I&O Aggressive IS and FV  Joneen RoachPaul Hoffman, ACNP [facility] Pager [phone] or [phone]   Acute hypercarbic resp failure, now off NIV, on antibiotics & lasix, care limitations noted  Independently examined pt, evaluated data & formulated above care plan with NP  PCCM available if needed  Oretha MilchALVA,Wilmetta Speiser V. MD

## 2014-01-02 NOTE — Care Management Note (Addendum)
  Page 1 of 1   01/02/2014     4:14:29 PM CARE MANAGEMENT NOTE 01/02/2014  Patient:  Tim BarerMCLEAN,[name]   Account Number:  [id number]  Date Initiated:  01/02/2014  Documentation initiated by:  Lagena Strand  Subjective/Objective Assessment:   Admitte from  skilled nursing facility due to altered mental status, bradycardia  Acute respiratory failure likely due to acute CHF, prior EF 55-60% in 2013, possible bilateral alveolar space opacities due to  HCAP  BiPAP     Action/Plan:   CM to follow for disposition needs   Anticipated DC Date:  01/05/2014   Anticipated DC Plan:  SKILLED NURSING FACILITY  In-house referral  Clinical Social Worker         Choice offered to / List presented to:  NA           Status of service:  Completed, signed off Medicare Important Message given?  NA - LOS <3 / Initial given by admissions (If response is "NO", the following Medicare IM given date fields will be blank) Date Medicare IM given:   Medicare IM given by:   Date Additional Medicare IM given:   Additional Medicare IM given by:    Discharge Disposition:  SKILLED NURSING FACILITY  Per UR Regulation:  Reviewed for med. necessity/level of care/duration of stay  If discussed at Long Length of Stay Meetings, dates discussed:    Comments:  Amond Speranza RN, BSN, MSHL, CCM  Nurse - Case Manager,   [phone]  01/02/2014 Social:  From SNF PT RECS:  Pending Disposition Plan:  SNF

## 2014-01-02 NOTE — Progress Notes (Signed)
EEG completed, results pending. 

## 2014-01-02 NOTE — Procedures (Signed)
ELECTROENCEPHALOGRAM REPORT  Patient: Tim Short       Room #: [id number] EEG No. ID: [id number] Age: 79 y.o.        Sex: male Referring Physician: Randol KernELGERGAWY, D Report Date:  01/02/2014        Interpreting Physician: Aline BrochureSTEWART,Naphtali Riede R  History: Tim BarerJames B Patel is an [age] y.o. male with a history of atrial fibrillation, diabetes mellitus and hypertension as well as coronary artery disease admitted for altered mental status for 3 days.  Indications for study:  Assess severity of encephalopathy; rule out seizure activity.  Technique: This is an 18 channel routine scalp EEG performed at the bedside with bipolar and monopolar montages arranged in accordance to the international 10/20 system of electrode placement.   Description: This EEG recording was performed during wakefulness. Predominant background activity consisted of low amplitude diffuse 1-2 Hz with irregular continuous delta activity with superimposed low amplitude fast beta activity diffusely. Photic stimulation and hyperventilation were not performed. No epileptiform discharges were recorded.  Interpretation: This EEG showed mild generalized nonspecific continuous slowing of cerebral activity. This pattern of slowing can be seen with a wide variety of encephalopathic states, including metabolic and degenerative disorders. No evidence of an epileptic disorder was demonstrated.   Venetia MaxonR Keslee Harrington M.D. [facility] [phone]

## 2014-01-02 NOTE — Progress Notes (Signed)
INITIAL NUTRITION ASSESSMENT  DOCUMENTATION CODES Per approved criteria  -Not Applicable   INTERVENTION:  To better meet nutrition needs, recommend increase Osmolite 1.2 to 70 ml/h to provide 2016 kcals, 93 gm protein, 1378 ml free water daily.  NUTRITION DIAGNOSIS: Inadequate oral intake related to inability to eat as evidenced by NPO status.   Goal: Intake to meet >90% of estimated nutrition needs.  Monitor:  TF tolerance/adequacy, weight trend, labs.  Reason for Assessment: New TF, Low Braden  79 y.o. male  Admitting Dx: Acute respiratory failure  ASSESSMENT: Patient brought in from skilled nursing facility due to altered mental status, bradycardia.  Per discussion with RN, patient remains encephalopathic. Is receiving Osmolite 1.2 at 50 ml/h via PEG providing 1440 kcals, 67 gm protein, 984 ml free water daily.  Per PTA medication list, patient was receiving Osmolite 1.5 formula (unsure of rate) with Prostat 30 ml BID.  Height: Ht Readings from Last 1 Encounters:  12/23/13 5\' 9"  (1.753 m)    Weight: Wt Readings from Last 1 Encounters:  01/02/14 192 lb 0.3 oz (87.1 kg)    Ideal Body Weight: 72.7 kg  % Ideal Body Weight: 120%  Wt Readings from Last 10 Encounters:  01/02/14 192 lb 0.3 oz (87.1 kg)  05/04/13 196 lb 3.4 oz (89 kg)  06/24/12 244 lb 11.4 oz (111 kg)  06/05/12 251 lb 15.8 oz (114.3 kg)  05/26/12 241 lb (109.317 kg)  04/09/12 241 lb 3.2 oz (109.408 kg)  11/07/11 220 lb (99.791 kg)  10/02/11 220 lb (99.791 kg)    Usual Body Weight: 196 lb (8 months ago)  % Usual Body Weight: 98%  BMI:  Body mass index is 28.34 kg/(m^2).  Estimated Nutritional Needs: Kcal: 1900-2100 Protein: 95-110 gm Fluid: >/= 2 L  Skin: 2 small stage 2 pressure ulcers to buttocks  Diet Order: Diet NPO time specified  EDUCATION NEEDS: -No education needs identified at this time   Intake/Output Summary (Last 24 hours) at 01/02/14 1540 Last data filed at 01/02/14  1400  Gross per 24 hour  Intake 1554.67 ml  Output   2850 ml  Net -1295.33 ml    Last BM: 12/18   Labs:   Recent Labs Lab 12/23/13 1050 12/23/13 1100 01/02/14 0355  NA 137 134* 139  K 4.7 4.8 4.0  CL 95* 91* 94*  CO2 37*  --  41*  BUN 25* 32* 28*  CREATININE 0.58 0.80 0.67  CALCIUM 8.5  --  9.1  GLUCOSE 133* 127* 109*    CBG (last 3)   Recent Labs  01/02/14 0354 01/02/14 0754 01/02/14 1136  GLUCAP 111* 149* 181*    Scheduled Meds: . antiseptic oral rinse  7 mL Mouth Rinse BID  . ceFEPime (MAXIPIME) IV  1 g Intravenous 3 times per day  . Chlorhexidine Gluconate Cloth  6 each Topical Q0600  . furosemide  40 mg Intravenous Daily  . sodium chloride  3 mL Intravenous Q12H  . vancomycin  1,000 mg Intravenous Q8H    Continuous Infusions: . feeding supplement (OSMOLITE 1.2 CAL) 1,000 mL (12/23/13 2058)    Past Medical History  Diagnosis Date  . Hypertension   . Hypercholesteremia   . Asthma   . Meniere disease   . Persistent atrial fibrillation     a. s/p multiple dccv's;  b. failed amio;  c. not felt to be AF RFCA canddiate due to LA dil;  d. s/p failed hybrid ablation @ [facility] in 09/2012;  e.  chronic pradaxa.  . Obesity   . Biatrial enlargement     LA size 5.3cm  . Obstructive sleep apnea     AHI 108/hr now on CPAP at 12cm H2O  . H/O hiatal hernia   . GERD (gastroesophageal reflux disease)     "associated w/hiatal hernia" (06/04/2012)  . Peptic ulcer 1950's  . Migraines     "haven't had one for about 15 years" (06/04/2012)  . Arthritis     "joints" (06/04/2012)  . Chronic lower back pain   . Nephrolithiasis ~ 1960's    "passed on their own" (06/04/2012)  . History of pneumonia 2002; 2006    "spent 8 days in isolation; Norovirus" (06/04/2012)  . Other and unspecified angina pectoris   . RLS (restless legs syndrome)   . Coronary artery disease     a. 05/2012 Cath/PCI: LM nl, LAD nl, LCX 6046m (3.0x15 Integrity BMS), PTCA of OM1 through stent struts (kissing  balloon).  . Diabetes mellitus without complication     FAMILY STATES PATIENT IS NOT DIABETIC  . CHF (congestive heart failure) 12/31/2013    Past Surgical History  Procedure Laterality Date  . Wrist fracture surgery  1992    "repaired w/left hip bone graft" (06/04/2012)  . Total knee arthroplasty  08/19/1999  . Colonoscopy w/ polypectomy  2006  . Knee arthroscopy with meniscal repair Right 1974    "medial meniscus repaired" (06/04/2012)  . Cardioversion  10/02/2011    Procedure: CARDIOVERSION;  Surgeon: Corky CraftsJayadeep S. Varanasi, MD;  [facility];  Service: Cardiovascular;  Laterality: N/A;  h/p in file drawer  . Cardioversion  11/07/2011    Procedure: CARDIOVERSION;  Surgeon: Corky CraftsJayadeep S. Varanasi, MD;  [facility];  Service: Cardiovascular;  Laterality: N/A;  h/p from 10/22 in file drawer/dl  . Cardiac catheterization  05/26/2012  . Coronary angioplasty with stent placement  06/04/2012    "1" (06/04/2012)  . Cataract extraction w/ intraocular lens  implant, bilateral  2009  . Hemorrhoid surgery  ~ 2003  . Hiatal hernia repair  1983  . Nissen fundoplication  1983  . Ablation of dysrhythmic focus  SEPT/OCT 2014    ATRIAL FIB  . Inguinal hernia repair Bilateral 2000 and 2005    "one done at a time" (06/04/2012)  . Colon surgery  10/08/2012   . Tracheostomy  OCT 2014  . Cardioversion N/A 04/08/2013    Procedure: CARDIOVERSION    (BEDSIDE) ;  Surgeon: Luis AbedJeffrey D Katz, MD;  [facility];  Service: Cardiovascular;  Laterality: N/A;  . Percutaneous coronary stent intervention (pci-s) N/A 06/04/2012    Procedure: PERCUTANEOUS CORONARY STENT INTERVENTION (PCI-S);  Surgeon: Corky CraftsJayadeep S Varanasi, MD;  [facility];  Service: Cardiovascular;  Laterality: N/A;    Joaquin CourtsKimberly Harris, RD, LDN, CNSC Pager [phone] After Hours Pager [phone]

## 2014-01-02 NOTE — Progress Notes (Signed)
Pt weaned to Venti mask per MD orders. Pt tolerated Venti for approximately 20 minutes at 89-90%. Pt notified RN the mask was not working, sats dropped to 70s. Pt placed back on Non rebreather, will continue to monitor. Cecille Rubinhompson,Lottie Sigman V, RN

## 2014-01-02 NOTE — Progress Notes (Signed)
UR completed Terrianne Cavness K. Yoshi Vicencio, RN, BSN, MSHL, CCM  01/02/2014 4:10 PM

## 2014-01-03 ENCOUNTER — Inpatient Hospital Stay (HOSPITAL_COMMUNITY): Payer: Medicare HMO

## 2014-01-03 DIAGNOSIS — I509 Heart failure, unspecified: Secondary | ICD-10-CM

## 2014-01-03 DIAGNOSIS — R0600 Dyspnea, unspecified: Secondary | ICD-10-CM

## 2014-01-03 DIAGNOSIS — I5033 Acute on chronic diastolic (congestive) heart failure: Principal | ICD-10-CM

## 2014-01-03 DIAGNOSIS — I2510 Atherosclerotic heart disease of native coronary artery without angina pectoris: Secondary | ICD-10-CM

## 2014-01-03 LAB — LEGIONELLA ANTIGEN, URINE

## 2014-01-03 LAB — CBC
HEMATOCRIT: 26.8 % — AB (ref 39.0–52.0)
HEMOGLOBIN: 7.5 g/dL — AB (ref 13.0–17.0)
MCH: 27.1 pg (ref 26.0–34.0)
MCHC: 28 g/dL — AB (ref 30.0–36.0)
MCV: 96.8 fL (ref 78.0–100.0)
Platelets: 121 10*3/uL — ABNORMAL LOW (ref 150–400)
RBC: 2.77 MIL/uL — ABNORMAL LOW (ref 4.22–5.81)
RDW: 17.1 % — ABNORMAL HIGH (ref 11.5–15.5)
WBC: 10.9 10*3/uL — ABNORMAL HIGH (ref 4.0–10.5)

## 2014-01-03 LAB — BASIC METABOLIC PANEL
Anion gap: 5 (ref 5–15)
BUN: 22 mg/dL (ref 6–23)
CALCIUM: 8.9 mg/dL (ref 8.4–10.5)
CHLORIDE: 89 meq/L — AB (ref 96–112)
CO2: 45 mmol/L — AB (ref 19–32)
Creatinine, Ser: 0.57 mg/dL (ref 0.50–1.35)
GFR calc Af Amer: >90 mL/min (ref >90)
GFR calc non Af Amer: >90 mL/min (ref >90)
GLUCOSE: 188 mg/dL — AB (ref 70–99)
Potassium: 3.4 mmol/L — ABNORMAL LOW (ref 3.5–5.1)
Sodium: 139 mmol/L (ref 135–145)

## 2014-01-03 LAB — PROCALCITONIN: Procalcitonin: 0.21 ng/mL

## 2014-01-03 LAB — GLUCOSE, CAPILLARY
GLUCOSE-CAPILLARY: 166 mg/dL — AB (ref 70–99)
Glucose-Capillary: 159 mg/dL — ABNORMAL HIGH (ref 70–99)
Glucose-Capillary: 173 mg/dL — ABNORMAL HIGH (ref 70–99)

## 2014-01-03 MED ORDER — SCOPOLAMINE 1 MG/3DAYS TD PT72
1.0000 | MEDICATED_PATCH | TRANSDERMAL | Status: DC
  Administered 2014-01-03: 1.5 mg via TRANSDERMAL
  Filled 2014-01-03: qty 1

## 2014-01-03 MED ORDER — FUROSEMIDE 10 MG/ML IJ SOLN
40.0000 mg | Freq: Two times a day (BID) | INTRAMUSCULAR | Status: DC
  Administered 2014-01-03 (×2): 40 mg via INTRAVENOUS
  Filled 2014-01-03 (×2): qty 4

## 2014-01-03 MED ORDER — POTASSIUM CHLORIDE 20 MEQ/15ML (10%) PO SOLN
40.0000 meq | ORAL | Status: AC
  Administered 2014-01-03 (×2): 40 meq
  Filled 2014-01-03 (×2): qty 30

## 2014-01-03 MED ORDER — LORAZEPAM 2 MG/ML IJ SOLN: 1.0000 mg | INTRAMUSCULAR | Status: DC | PRN

## 2014-01-03 MED ORDER — GUAIFENESIN 100 MG/5ML PO SOLN
15.0000 mL | ORAL | Status: DC
  Administered 2014-01-03: 300 mg
  Filled 2014-01-03 (×4): qty 15

## 2014-01-03 MED ORDER — MORPHINE SULFATE 2 MG/ML IJ SOLN
1.0000 mg | INTRAMUSCULAR | Status: DC | PRN
  Administered 2014-01-03 (×2): 1 mg via INTRAVENOUS
  Filled 2014-01-03 (×2): qty 1

## 2014-01-03 MED ORDER — RIVAROXABAN 20 MG PO TABS: 20.0000 mg | ORAL_TABLET | Freq: Every day | ORAL | Status: DC

## 2014-01-03 MED ORDER — GUAIFENESIN 100 MG/5ML PO SOLN
15.0000 mL | Freq: Four times a day (QID) | ORAL | Status: DC
  Filled 2014-01-03 (×4): qty 15

## 2014-01-04 LAB — CULTURE, BLOOD (ROUTINE X 2)

## 2014-01-04 LAB — VANCOMYCIN, TROUGH: VANCOMYCIN TR: 32.5 ug/mL — AB (ref 10.0–20.0)

## 2014-01-04 NOTE — Discharge Summary (Signed)
Brief death summary:  Patient is a 79 year old male with hypertension and hyperlipidemia, atrial fibrillation on xarelto, diabetes mellitus, CAD, GERD, obstructive sleep apnea, chronic respiratory failure, PEG tube placement was brought from the skilled nursing facility with altered mental status. Patient is also on amiodarone, Metoprolol and Cardizem for A. fib prior to admission, BP was 82/49 with heart rate of 45 respiratory rate 27 at the time of triage. Venous ABG showed a pH of 7.2 with PCO2 of 92, patient was placed on BiPAP in ED Chest x-ray showed findings concerning for CHF, bilateral interstitial and alveolar airspace opacities, no pneumothorax Patient was seen by pulmonary critical care, patient is DO NOT RESUSCITATE, started on broad-spectrum IV antibiotic for HCAP, and an IV Lasix for CHF.  Patient did not have any improvement despite being on IV antibiotics and IV Lasix, continued to deteriorate  gradually, could not be weaned off nonrebreather bag, cardiology service were consulted with recommendation to continue IV diuresis, given patient deterioration  despite antibiotics and IV Lasix, palliative care were consulted, and had a family meeting, with decision to shift to comfort care. Patient passed away that same evening, the family were at bedside as per documentation.  Huey Bienenstockawood [place] Cres[place] MD

## 2014-01-07 LAB — CULTURE, BLOOD (ROUTINE X 2): Culture: NO GROWTH

## 2014-01-09 DIAGNOSIS — R0600 Dyspnea, unspecified: Secondary | ICD-10-CM

## 2014-01-13 NOTE — Consult Note (Signed)
Patient Tim Short      DOB: 1934-01-15      AVW:[id number]     Consult Note from the Palliative Medicine Team at [facility]    Consult Requested by: Dr Randol Kern     PCP:  Duane Lope, MD Reason for Consultation:Clarification of GOC and options     Phone Number:[phone]  Assessment of patients Current state:  Continued physical, functional and cognitive decline 2/2 to multiple co-morbid ites over the past 9 months, failure to thrive, currently appears to be transitioning at EOL.  Consult is for review of medical treatment options, clarification of goals of care and end of life issues,  options and symptom recommendation.  This NP Lorinda Creed reviewed medical records, received report from team, assessed the patient and then meet at the patient's bedside along with [name]  to discuss diagnosis prognosis, GOC, EOL wishes disposition and options.  Discussed with family the limited prognosis of likely hrs to days.  A detailed discussion was had today regarding artifical feeding and hydration, continued IV antibiotics was had.  The difference between a aggressive medical intervention path  and a palliative comfort care path for this patient at this time was had.  Values and goals of care important to patient and family were attempted to be elicited.   Natural trajectory and expectations at EOL were discussed.  Questions and concerns addressed. Family encouraged to call with questions or concerns.  PMT will continue to support holistically.   Goals of Care: 1.  Code Status:    DNR/DNI-comfort is main focus of care   At this time focus is comfort, quality and dignity.  No further diagnostics, BiPap, antibiotics.  Ensure symptom(dyspnea/pain) managment  with prn medications.  (See MAR)  2. Disposition:  Expect hospital death/hours to days   3. Psychosocial:  Emotional support offered to son and his significant other at bedside.  Questions and concerns addressed.    Brief  HPI: Patient is a 80 year old male with hypertension and hyperlipidemia, atrial fibrillation on xarelto, diabetes mellitus, CAD, GERD, obstructive sleep apnea, chronic respiratory failure, PEG tube placement was brought from the skilled nursing facility with altered mental status.  Continued decline during hospitalization with chronic/acute respiratory failure. Presently transitioning at EOL  ROS: unable to illicit, patient is unresponsive   PMH:  Past Medical History  Diagnosis Date  . Hypertension   . Hypercholesteremia   . Asthma   . Meniere disease   . Persistent atrial fibrillation     a. s/p multiple dccv's;  b. failed amio;  c. not felt to be AF RFCA canddiate due to LA dil;  d. s/p failed hybrid ablation @ [facility] in 09/2012;  e. chronic pradaxa.  . Obesity   . Biatrial enlargement     LA size 5.3cm  . Obstructive sleep apnea     AHI 108/hr now on CPAP at 12cm H2O  . H/O hiatal hernia   . GERD (gastroesophageal reflux disease)     "associated w/hiatal hernia" (06/05/2012)  . Peptic ulcer 1950's  . Migraines     "haven't had one for about 15 years" (05-Jun-2012)  . Arthritis     "joints" (2012-06-05)  . Chronic lower back pain   . Nephrolithiasis ~ 1960's    "passed on their own" (2012-06-05)  . History of pneumonia 2002; 2006    "spent 8 days in isolation; Norovirus" (06-05-12)  . Other and unspecified angina pectoris   . RLS (restless legs syndrome)   .  Coronary artery disease     a. 05/2012 Cath/PCI: LM nl, LAD nl, LCX 969m (3.0x15 Integrity BMS), PTCA of OM1 through stent struts (kissing balloon).  . Diabetes mellitus without complication     FAMILY STATES PATIENT IS NOT DIABETIC  . CHF (congestive heart failure) 2013/11/08     PSH: Past Surgical History  Procedure Laterality Date  . Wrist fracture surgery  1992    "repaired w/left hip bone graft" (06/04/2012)  . Total knee arthroplasty  08/19/1999  . Colonoscopy w/ polypectomy  2006  . Knee arthroscopy with meniscal  repair Right 1974    "medial meniscus repaired" (06/04/2012)  . Cardioversion  10/02/2011    Procedure: CARDIOVERSION;  Surgeon: Corky CraftsJayadeep S. Varanasi, MD;  [facility];  Service: Cardiovascular;  Laterality: N/A;  h/p in file drawer  . Cardioversion  11/07/2011    Procedure: CARDIOVERSION;  Surgeon: Corky CraftsJayadeep S. Varanasi, MD;  [facility];  Service: Cardiovascular;  Laterality: N/A;  h/p from 10/22 in file drawer/dl  . Cardiac catheterization  05/26/2012  . Coronary angioplasty with stent placement  06/04/2012    "1" (06/04/2012)  . Cataract extraction w/ intraocular lens  implant, bilateral  2009  . Hemorrhoid surgery  ~ 2003  . Hiatal hernia repair  1983  . Nissen fundoplication  1983  . Ablation of dysrhythmic focus  SEPT/OCT 2014    ATRIAL FIB  . Inguinal hernia repair Bilateral 2000 and 2005    "one done at a time" (06/04/2012)  . Colon surgery  10/08/2012   . Tracheostomy  OCT 2014  . Cardioversion N/A 04/08/2013    Procedure: CARDIOVERSION    (BEDSIDE) ;  Surgeon: Luis AbedJeffrey D Katz, MD;  [facility];  Service: Cardiovascular;  Laterality: N/A;  . Percutaneous coronary stent intervention (pci-s) N/A 06/04/2012    Procedure: PERCUTANEOUS CORONARY STENT INTERVENTION (PCI-S);  Surgeon: Corky CraftsJayadeep S Varanasi, MD;  [facility];  Service: Cardiovascular;  Laterality: N/A;   I have reviewed the FH and SH and  If appropriate update it with new information. Allergies  Allergen Reactions  . Corticosteroids     Inhaled Corticosteroids--Hoarseness, dry mouth  . Furosemide Other (See Comments)    Discussed with son 4/15, mild reaction (rash?) with oral formulation only    Scheduled Meds: . antiseptic oral rinse  7 mL Mouth Rinse BID  . ceFEPime (MAXIPIME) IV  1 g Intravenous 3 times per day  . Chlorhexidine Gluconate Cloth  6 each Topical Q0600  . furosemide  40 mg Intravenous Q12H  . guaiFENesin  15 mL Per Tube 4 times per day  . insulin aspart  0-9 Units  Subcutaneous Q6H  . rivaroxaban  20 mg Per Tube Q supper  . scopolamine  1 patch Transdermal Q72H  . sodium chloride  3 mL Intravenous Q12H   Continuous Infusions: . feeding supplement (OSMOLITE 1.2 CAL) 1,000 mL (12/20/2013 0601)   PRN Meds:.sodium chloride, acetaminophen, ondansetron (ZOFRAN) IV, sodium chloride    BP 133/64 mmHg  Pulse 80  Temp(Src) 97.7 F (36.5 C) (Oral)  Resp 32  Ht 5\' 9"  (1.753 m)  Wt 92.6 kg (204 lb 2.3 oz)  BMI 30.13 kg/m2  SpO2 95%   PPS:20 % at best   Intake/Output Summary (Last 24 hours) at 01/06/2014 1722 Last data filed at 01/11/2014 1257  Gross per 24 hour  Intake    900 ml  Output   1850 ml  Net   -950 ml     Physical  Exam:  General: unresponsive, transitioning at EOL Chest:  Diminished throughout,    CVS: RRR Skin: cool/mottled feet and knees   Labs: CBC    Component Value Date/Time   WBC 10.9* 01/09/2014 0505   RBC 2.77* 12/13/2013 0505   HGB 7.5* 01/02/2014 0505   HCT 26.8* 12/18/2013 0505   PLT 121* 12/23/2013 0505   MCV 96.8 01/04/2014 0505   MCH 27.1 12/30/2013 0505   MCHC 28.0* 12/15/2013 0505   RDW 17.1* 12/15/2013 0505   LYMPHSABS 1.2 Feb 04, 2013 1050   MONOABS 1.1* Feb 04, 2013 1050   EOSABS 0.0 Feb 04, 2013 1050   BASOSABS 0.0 Feb 04, 2013 1050    BMET    Component Value Date/Time   NA 139 01/02/2014 0505   K 3.4* 01/10/2014 0505   CL 89* 01/05/2014 0505   CO2 45* 12/19/2013 0505   GLUCOSE 188* 01/02/2014 0505   BUN 22 12/25/2013 0505   CREATININE 0.57 12/19/2013 0505   CALCIUM 8.9 12/26/2013 0505   GFRNONAA >90 01/07/2014 0505   GFRAA >90 12/18/2013 0505    CMP     Component Value Date/Time   NA 139 01/11/2014 0505   K 3.4* 12/26/2013 0505   CL 89* 01/09/2014 0505   CO2 45* 12/28/2013 0505   GLUCOSE 188* 12/16/2013 0505   BUN 22 01/10/2014 0505   CREATININE 0.57 12/22/2013 0505   CALCIUM 8.9 12/23/2013 0505   PROT 5.8* Feb 04, 2013 1050   ALBUMIN 2.2* Feb 04, 2013 1050   AST 16 Feb 04, 2013 1050   ALT  13 Feb 04, 2013 1050   ALKPHOS 55 Feb 04, 2013 1050   BILITOT 0.4 Feb 04, 2013 1050   GFRNONAA >90 12/27/2013 0505   GFRAA >90 01/05/2014 0505    Time In Time Out Total Time Spent with Patient Total Overall Time  1700 1815 70 min 75 min    Greater than 50%  of this time was spent counseling and coordinating care related to the above assessment and plan.   Lorinda CreedMary Alonda Weaber NP  Palliative Medicine Team Team Phone # [phone] Pager [phone]  Discussed with Dr Randol KernElgergawy

## 2014-01-13 NOTE — Progress Notes (Signed)
ANTIBIOTIC CONSULT NOTE - FOLLOW UP  Pharmacy Consult for vancomycin and cefepime Indication: HCAP  Allergies  Allergen Reactions  . Corticosteroids     Inhaled Corticosteroids--Hoarseness, dry mouth  . Furosemide Other (See Comments)    Discussed with son 4/15, mild reaction (rash?) with oral formulation only     Patient Measurements: Height: 5\' 9"  (175.3 cm) Weight: 204 lb 2.3 oz (92.6 kg) IBW/kg (Calculated) : 70.7   Vital Signs: Temp: 97.7 F (36.5 C) (12/22 0803) Temp Source: Oral (12/22 0803) BP: 133/64 mmHg (12/22 0355) Intake/Output from previous day: 12/21 0701 - 12/22 0700 In: 1650 [NG/GT:1400; IV Piggyback:250] Out: 3250 [Urine:3250] Intake/Output from this shift: Total I/O In: -  Out: 850 [Urine:850]  Labs:  Recent Labs  Jun 12, 2013 1050 Jun 12, 2013 1100 01/02/14 0355 12/17/2013 0505  WBC 9.7  --   --  10.9*  HGB 7.3* 8.2*  --  7.5*  PLT 114*  --   --  121*  CREATININE 0.58 0.80 0.67 0.57   Estimated Creatinine Clearance: 87 mL/min (by C-G formula based on Cr of 0.57).  Recent Labs  01/02/2014 1237  VANCOTROUGH 32.5*     Microbiology: Recent Results (from the past 720 hour(s))  Urine culture     Status: None   Collection Time: Jun 12, 2013 11:08 AM  Result Value Ref Range Status   Specimen Description URINE, CATHETERIZED  Final   Special Requests Normal  Final   Culture  Setup Time   Final    2013-08-02 21:31 Performed at [facility]    Colony Count NO GROWTH Performed at [facility]   Final   Culture NO GROWTH Performed at [facility]   Final   Report Status 01/02/2014 FINAL  Final  Blood Culture (routine x 2)     Status: None (Preliminary result)   Collection Time: Jun 12, 2013 11:31 AM  Result Value Ref Range Status   Specimen Description BLOOD LEFT FOREARM  Final   Special Requests BOTTLES DRAWN AEROBIC AND ANAEROBIC 5 CC  Final   Culture  Setup Time   Final    2013-08-02 18:32 Performed at [facility]    Culture   Final           BLOOD CULTURE RECEIVED NO GROWTH TO DATE CULTURE WILL BE HELD FOR 5 DAYS BEFORE ISSUING A FINAL NEGATIVE REPORT Performed at [facility]    Report Status PENDING  Incomplete  Blood Culture (routine x 2)     Status: None (Preliminary result)   Collection Time: Jun 12, 2013 11:43 AM  Result Value Ref Range Status   Specimen Description BLOOD RIGHT HAND  Final   Special Requests BOTTLES DRAWN AEROBIC ONLY 4 CC  Final   Culture  Setup Time   Final    2013-08-02 18:32 Performed at [facility]    Culture   Final    GRAM POSITIVE COCCI IN CLUSTERS Note: Gram Stain Report Called to,Read Back By and Verified With: TENEA MELTON ON 01/02/2014 AT 10:45P BY WILEJ Performed at [facility]    Report Status PENDING  Incomplete  MRSA PCR Screening     Status: None   Collection Time: Jun 12, 2013  3:46 PM  Result Value Ref Range Status   MRSA by PCR NEGATIVE NEGATIVE Final    Comment:        The GeneXpert MRSA Assay (FDA approved for NASAL specimens only), is one component of a comprehensive MRSA colonization surveillance program. It is not intended to diagnose  MRSA infection nor to guide or monitor treatment for MRSA infections.     Anti-infectives    Start     Dose/Rate Route Frequency Ordered Stop   12/28/2013 2100  vancomycin (VANCOCIN) IVPB 1000 mg/200 mL premix  Status:  Discontinued     1,000 mg200 mL/hr over 60 Minutes Intravenous Every 8 hours 12/30/2013 1211 10-Apr-2013 1328   12/30/2013 2000  ceFEPIme (MAXIPIME) 1 g in dextrose 5 % 50 mL IVPB     1 g100 mL/hr over 30 Minutes Intravenous 3 times per day 01/04/2014 1211     01/11/2014 1115  ceFEPIme (MAXIPIME) 2 g in dextrose 5 % 50 mL IVPB     2 g100 mL/hr over 30 Minutes Intravenous  Once 12/31/2013 1106 01/07/2014 1251   01/02/2014 1115  vancomycin (VANCOCIN) IVPB 1000 mg/200 mL premix     1,000 mg200 mL/hr over 60 Minutes Intravenous  Once 12/14/2013 1106 12/27/2013 1341      Assessment: 79 YOM  brought from facility with AMS, bradycardia and sepsis presumed from HCAP. Was started on vancomycin and cefepime. His weight and renal function warranted a higher starting dose of vancomycin, and while his SCr has not increased, he was receiving a high dose for his age. A vancomycin trough drawn this afternoon was elevated at 32.345mcg/mL.  SCr 0.57 with est CrCl ~6585mL/min. WBC 10.9, Tmax/24h 99.  Goal of Therapy:  Vancomycin trough level 15-20 mcg/ml  Plan:  1. Discontinue vancomycin standing order. Patient- specific ke = 0.0472, making t1/2 ~14h.  2. Will check VR with AM labs- based on half-life, estimate it should be ~2416mcg/mL. If around this value, can start vancomycin 1000mg  IV q12h and recheck trough at next SS 3. Continue Cefepime 1 gm q8h 4. Monitor renal function, clinical response, c/s  Jillane Po D. Cierra Rothgeb, PharmD, BCPS Clinical Pharmacist Pager: [phone] 11-10-2013 1:44 PM

## 2014-01-13 NOTE — Progress Notes (Signed)
PT Cancellation Note  Patient Details Name: Tim BarerJames B Palmateer MRN: [id number] DOB: 11/08/1934   Cancelled Treatment:    Reason Eval/Treat Not Completed: Patient not medically ready Per nursing, patient is unresponsive at this time. Will follow up with patient when he is more alert and able to tolerate PT evaluation.    [address]Teila Skalsky Secor [place], [place] [phone]  [place], Cortlandt Capuano S 12/19/2013, 11:09 AM

## 2014-01-13 NOTE — Progress Notes (Signed)
Patient too weak to do flutter, pallative consult was recommended.

## 2014-01-13 NOTE — Consult Note (Signed)
CARDIOLOGY CONSULT NOTE  Patient ID: Tim Short MRN: [id number] DOB/AGE: 78/06/1934 78 y.o.  Admit date: 12/22/2013 Primary Physician C Donovan KailAllen Ross Primary Cardiologist Drs Abe PeopleVaransi and Allred Chief Complaint  AMS, bradycardia, acute on chronic CHF.   HPI:  The patient has a history of atrial fib on chronic anticoagulation.  He has had chronic illness with vent depedent respiratory failure previously.  He was admitted on 12/20 from her nursing home with AMS.  In the ED she was noted to have a paced rhythm with hypotension and a BP of 82/49.  She was admitted and treated with IV Lasix for presumed acute diastolic HF.  She has also been treated for possible aspiration pneumonia.    Beta blockers, amiodarone and Cardizem were held.  Echo this admission showed a well preserved EF.  He does have some evidence of elevated pulmonary pressures.  He is being treated with IV Lasix.    Palliative care consult has been requested as well.    The patient is nonresponsive except to deep stimuli.  He is unable to provide any history.    Past Medical History  Diagnosis Date  . Hypertension   . Hypercholesteremia   . Asthma   . Meniere disease   . Persistent atrial fibrillation     a. s/p multiple dccv's;  b. failed amio;  c. not felt to be AF RFCA canddiate due to LA dil;  d. s/p failed hybrid ablation @ [facility] in 09/2012;  e. chronic pradaxa.  . Obesity   . Biatrial enlargement     LA size 5.3cm  . Obstructive sleep apnea     AHI 108/hr now on CPAP at 12cm H2O  . H/O hiatal hernia   . GERD (gastroesophageal reflux disease)     "associated w/hiatal hernia" (06/04/2012)  . Peptic ulcer 1950's  . Migraines     "haven't had one for about 15 years" (06/04/2012)  . Arthritis     "joints" (06/04/2012)  . Chronic lower back pain   . Nephrolithiasis ~ 1960's    "passed on their own" (06/04/2012)  . History of pneumonia 2002; 2006    "spent 8 days in isolation; Norovirus" (06/04/2012)  . Other and  unspecified angina pectoris   . RLS (restless legs syndrome)   . Coronary artery disease     a. 05/2012 Cath/PCI: LM nl, LAD nl, LCX 249m (3.0x15 Integrity BMS), PTCA of OM1 through stent struts (kissing balloon).  . Diabetes mellitus without complication     FAMILY STATES PATIENT IS NOT DIABETIC  . CHF (congestive heart failure) 12/25/2013    Past Surgical History  Procedure Laterality Date  . Wrist fracture surgery  1992    "repaired w/left hip bone graft" (06/04/2012)  . Total knee arthroplasty  08/19/1999  . Colonoscopy w/ polypectomy  2006  . Knee arthroscopy with meniscal repair Right 1974    "medial meniscus repaired" (06/04/2012)  . Cardioversion  10/02/2011    Procedure: CARDIOVERSION;  Surgeon: Corky CraftsJayadeep S. Varanasi, MD;  [facility];  Service: Cardiovascular;  Laterality: N/A;  h/p in file drawer  . Cardioversion  11/07/2011    Procedure: CARDIOVERSION;  Surgeon: Corky CraftsJayadeep S. Varanasi, MD;  [facility];  Service: Cardiovascular;  Laterality: N/A;  h/p from 10/22 in file drawer/dl  . Cardiac catheterization  05/26/2012  . Coronary angioplasty with stent placement  06/04/2012    "1" (06/04/2012)  . Cataract extraction w/ intraocular lens  implant, bilateral  2009  . Hemorrhoid  surgery  ~ 2003  . Hiatal hernia repair  1983  . Nissen fundoplication  1983  . Ablation of dysrhythmic focus  SEPT/OCT 2014    ATRIAL FIB  . Inguinal hernia repair Bilateral 2000 and 2005    "one done at a time" (06/04/2012)  . Colon surgery  10/08/2012   . Tracheostomy  OCT 2014  . Cardioversion N/A 04/08/2013    Procedure: CARDIOVERSION    (BEDSIDE) ;  Surgeon: Luis Abed, MD;  Location: [facility] OR;  Service: Cardiovascular;  Laterality: N/A;  . Percutaneous coronary stent intervention (pci-s) N/A 06/04/2012    Procedure: PERCUTANEOUS CORONARY STENT INTERVENTION (PCI-S);  Surgeon: Corky Crafts, MD;  Location: [facility] CATH LAB;  Service: Cardiovascular;  Laterality: N/A;    Allergies    Allergen Reactions  . Corticosteroids     Inhaled Corticosteroids--Hoarseness, dry mouth  . Furosemide Other (See Comments)    Discussed with son 4/15, mild reaction (rash?) with oral formulation only    Prescriptions prior to admission  Medication Sig Dispense Refill Last Dose  . amiodarone (PACERONE) 100 MG tablet Give 100 mg by tube daily.   12/31/2013 at Unknown time  . atorvastatin (LIPITOR) 40 MG tablet Give 40 mg by tube daily.    Past Week at Unknown time  . buPROPion (WELLBUTRIN XL) 150 MG 24 hr tablet Take 150 mg by mouth daily.   12/26/2013 at Unknown time  . gabapentin (NEURONTIN) 600 MG tablet Give 300 mg by tube at bedtime.   Past Week at Unknown time  . HYDROcodone-acetaminophen (NORCO/VICODIN) 5-325 MG per tablet Give 1 tablet by tube every 8 (eight) hours.    01/12/2014 at Unknown time  . Lactobacillus Rhamnosus, GG, (CULTURELLE PO) Give 1 tablet by tube 2 (two) times daily.   01/10/2014 at Unknown time  . meclizine (ANTIVERT) 25 MG tablet Give 25 mg by tube every 8 (eight) hours.    12/31/2013 at Unknown time  . Melatonin 3 MG CAPS Give 1 capsule by tube daily.   Past Week at Unknown time  . metoprolol tartrate (LOPRESSOR) 25 MG tablet Take 1 tablet (25 mg total) by mouth 2 (two) times daily. (Patient taking differently: Give 25 mg by tube every 6 (six) hours as needed (pulse >110 and systolic BP greater than 100). )   Past Week at Unknown time  . naphazoline (NAPHCON) 0.1 % ophthalmic solution Place 1 drop into both eyes 4 (four) times daily as needed for irritation. 15 mL 0 01/08/2014 at Unknown time  . Nutritional Supplements (FEEDING SUPPLEMENT, OSMOLITE 1.5 CAL,) LIQD Place 1,000 mLs into feeding tube continuous.  0 12/14/2013 at Unknown time  . paroxetine (PAXIL) 10 MG/5ML suspension Give 30 mg by tube daily.   12/31/2013 at Unknown time  . promethazine (PHENERGAN) 25 MG tablet Give 25 mg by tube every 6 (six) hours as needed for nausea or vomiting.   12/31/2013 at  Unknown time  . rivaroxaban (XARELTO) 20 MG TABS tablet Take 1 tablet (20 mg total) by mouth daily with supper. (Patient taking differently: Give 20 mg by tube daily with supper. ) 30 tablet  Past Week at Unknown time  . acetaminophen (TYLENOL) 160 MG/5ML solution Take 15.6 mLs (500 mg total) by mouth every 6 (six) hours as needed for mild pain, headache or fever. (Patient taking differently: Place 500 mg into feeding tube every 6 (six) hours as needed for mild pain, headache or fever. ) 120 mL 0 Unk  . Amino Acids-Protein Hydrolys (  FEEDING SUPPLEMENT, PRO-STAT SUGAR FREE 64,) LIQD Place 30 mLs into feeding tube 2 (two) times daily at 10 AM and 5 PM. (Patient not taking: Reported on 01/08/2014) 900 mL 0 Not Taking at Unknown time  . amiodarone (PACERONE) 200 MG tablet Take 1 tablet (200 mg total) by mouth 2 (two) times daily. (Patient not taking: Reported on 01/04/2014)   Not Taking at Unknown time  . dextrose 5 % solution Inject 50 mLs into the vein continuous. (Patient not taking: Reported on 01/10/2014) 50 mL  Not Taking at Unknown time  . diltiazem (CARDIZEM) 120 MG tablet Take 1 tablet (120 mg total) by mouth 4 (four) times daily -  with meals and at bedtime. (Patient not taking: Reported on 01/02/2014)   Not Taking at Unknown time  . insulin aspart (NOVOLOG) 100 UNIT/ML injection Inject 0-15 Units into the skin every 4 (four) hours. (Patient not taking: Reported on 12/17/2013) 10 mL 11 Not Taking at Unknown time  . ipratropium-albuterol (DUONEB) 0.5-2.5 (3) MG/3ML SOLN Take 3 mLs by nebulization every 4 (four) hours as needed.    Unk  . levalbuterol (XOPENEX) 0.63 MG/3ML nebulizer solution Take 3 mLs (0.63 mg total) by nebulization every 6 (six) hours as needed for wheezing or shortness of breath. 3 mL 12 Unk  . metoprolol (LOPRESSOR) 1 MG/ML injection Inject 10 mLs (10 mg total) into the vein every 4 (four) hours as needed (give for HR > 110- hold and call MD if SBP less than 90). (Patient not  taking: Reported on 12/23/2013) 5 mL  Not Taking at Unknown time  . nitroGLYCERIN (NITROSTAT) 0.4 MG SL tablet Place 0.4 mg under the tongue every 5 (five) minutes as needed for chest pain.   Unk  . ondansetron (ZOFRAN) 4 MG/2ML SOLN injection Inject 2 mLs (4 mg total) into the vein every 6 (six) hours as needed for nausea. 2 mL 0 Unk  . simethicone (MYLICON) 40 MG/0.6ML drops Take 0.6 mLs (40 mg total) by mouth 4 (four) times daily as needed for flatulence. 30 mL 0 Unk   Family History  Problem Relation Age of Onset  . Congestive Heart Failure    . COPD Father   . Heart failure Sister   . Heart failure Brother   . CVA Brother   . Heart attack Brother   . Heart disease Brother   . CVA Brother   . Heart disease Brother     History   Social History  . Marital Status: Divorced    Spouse Name: N/A    Number of Children: N/A  . Years of Education: N/A   Occupational History  . Not on file.   Social History Main Topics  . Smoking status: Former Smoker -- 25 years    Types: Pipe    Quit date: 10/15/1981  . Smokeless tobacco: Never Used  . Alcohol Use: No  . Drug Use: No  . Sexual Activity: Not Currently   Other Topics Concern  . Not on file   Social History Narrative   [place] in [place].  Divorced.   Retired Oncologistteacher/ administrator in [place]     ROS:  Unable to obtain.  Patient is nonresponsive  Physical Exam: Blood pressure 133/64, pulse 80, temperature 97.7 F (36.5 C), temperature source Oral, resp. rate 32, height 5\' 9"  (1.753 m), weight 204 lb 2.3 oz (92.6 kg), SpO2 95 %.  GENERAL:  Chronically ill appearing HEENT:  Pupils equal round and reactive, fundi not visualized, oral  mucosa unremarkable NECK:  No jugular venous distention, waveform within normal limits, carotid upstroke brisk and symmetric, no bruits, no thyromegaly LUNGS:  Decreased breath sounds left greater than right.   BACK:  No CVA tenderness CHEST:  Unremarkable HEART:  PMI not displaced or  sustained,S1 and S2 within normal limits, no S3, no S4, no clicks, no rubs, no murmurs, distant heart sounds. ABD:  Flat, positive bowel sounds normal in frequency in pitch, no bruits, no rebound, no guarding, no midline pulsatile mass, no hepatomegaly, no splenomegaly EXT:  2 plus pulses throughout, no edema, no cyanosis no clubbing SKIN:  No rashes no nodules NEURO:  Patient is unresponsive.     Labs: Lab Results  Component Value Date   BUN 22 January 30, 2014   Lab Results  Component Value Date   CREATININE 0.57 01/30/2014   Lab Results  Component Value Date   NA 139 01-30-14   K 3.4* 30-Jan-2014   CL 89* 2014-01-30   CO2 45* 30-Jan-2014   Lab Results  Component Value Date   TROPONINI <0.30 12/25/2013   Lab Results  Component Value Date   WBC 10.9* 2014/01/30   HGB 7.5* 01-30-2014   HCT 26.8* Jan 30, 2014   MCV 96.8 01-30-14   PLT 121* 01-30-14    Lab Results  Component Value Date   ALT 13 12/13/2013   AST 16 12/24/2013   ALKPHOS 55 12/19/2013   BILITOT 0.4 12/14/2013     Radiology:   CXR: There is cardiomegaly. There is a moderate left effusion. There is generalized interstitial edema. There is patchy airspace consolidation in both lower lobes. There is no apparent adenopathy. No pneumothorax. There is a left atrial appendage clamp present.  EKG:  Sinus bradycardia with first degree AV block.  QT prolonged.  Nonspecific ST T wave changes.   01/02/14  ASSESSMENT AND PLAN:   CHF:  Acute on chronic diastolic HF.  At this point he seems to be having good urine output with current IV Lasix.  I would continue this with IV BID.   He does have a left sided pleural effusion.  However, I don't feel that he is a candidate for thoracentesis.  No further cardiac work up is indicated.   I agree with holding his other meds for now.   Prognosis is poor and the patient is a full DNR.   CAD:  No evidence of active ischemia.    SignedFaizaan Falls 01-30-14, 12:53  PM

## 2014-01-13 NOTE — Progress Notes (Addendum)
CRITICAL VALUE ALERT  Critical value received:  Vanc Troph=32.5  Date of notification: 2013/08/20  Time of notification:  1320  Critical value read back:  yes  Nurse who received alert:  Eduardo OsierJessica Kynzley Dowson  MD notified (1st page):  Dr. Randol KernElgergawy  Time of first page:  1322  MD notified (2nd page):  Time of second page:  Responding MD:  Dr. Cornell BarmanElgerawy, stated [facility]  Time MD responded:  1325

## 2014-01-13 NOTE — Progress Notes (Signed)
CRITICAL VALUE ALERT  Critical value received: CO2 45  Date of notification: 01/08/2014  Time of notification: 0627  Critical value read back:Yes  Nurse who received alert: Mickey Farberenea Tyria Springer  MD notified (1st page): Pearson GrippeJames Kim  Time of first page: 0650  MD notified (2nd page):  Time of second page:  Responding MD: Pearson GrippeJames Kim  Time MD responded:  [phone]

## 2014-01-13 NOTE — Progress Notes (Signed)
Meet with son at bedside.  Plan is to shift to full comfort path.  No further diagnostics, BiPap, IV antibiotics or TF  Focus is comfort quality and dignity, see MAR for symptom managment .  Prognosis is likely hrs to days.  Son verbalizes understanding of limited prognosis.  Full note to follow.  Lorinda CreedMary Odessa Nishi NP  Palliative Medicine Team Team Phone # [phone] Pager [phone]  Discussed with Dr Randol KernElgergawy

## 2014-01-13 NOTE — Progress Notes (Signed)
Attempted to wean patient to 55% O2 on venturi mask from 100% O2 via non-rebreather. Patient quickly desaturated to 75% SPO2 and became more tachypneic. Replaced 100% O2 via non-rebreather mask with brisk return of O2 saturation to 92%. O2 saturation at 2 minutes = 94%.  Patient unable to tolerate weaning at this time. Continuing to monitor.

## 2014-01-13 NOTE — Progress Notes (Signed)
Patient unable to do flutter valve at this time.  Patient is too short of breath and tired per RN.  Will try again in a few hours.

## 2014-01-13 NOTE — Progress Notes (Signed)
Pt expired. Death was expected and pt was full comfort care and DNR. Death certificate completed and handed to RN. Family at bedside. Offered condolences and chaplain services.  Jimmye NormanKaren Kirby-Graham, NP [facility]

## 2014-01-13 NOTE — Progress Notes (Addendum)
Patient Demographics  Tim Short, is a 80 y.o. male, DOB - 01/16/1934, ZOX:[id number]  Admit date - 04-20-2013   Admitting Physician Ripudeep Jenna LuoK Rai, MD  Outpatient Primary MD for the patient is  Duane Lopeoss, Alan, MD  LOS - 2   Chief Complaint  Patient presents with  . Altered Mental Status  . Bradycardia  . Code Sepsis      Admission history of present illness/brief narrative:  Patient is a 80 year old male with hypertension and hyperlipidemia, atrial fibrillation on xarelto, diabetes mellitus, CAD, GERD, obstructive sleep apnea, chronic respiratory failure, PEG tube placement was brought from the skilled nursing facility with altered mental status.  Patient is also on amiodarone, Metoprolol and Cardizem for A. fib prior to admission, BP was 82/49 with heart rate of 45 respiratory rate 27 at the time of triage. Venous ABG showed a pH of 7.2 with PCO2 of 92, patient was placed on BiPAP in ED Chest x-ray showed findings concerning for CHF, bilateral interstitial and alveolar airspace opacities, no pneumothorax Patient was seen by pulmonary critical care, patient is DO NOT RESUSCITATE, started on broad-spectrum IV antibiotic for HCAP, and an IV Lasix for CHF.  Subjective:   Tim MurrayJames Sherpa sleeping comfortably, appears to be more lethargic today.  Assessment & Plan    Principal Problem:   Acute respiratory failure Active Problems:   Essential hypertension   CAD (coronary artery disease)   Chronic respiratory failure   HCAP (healthcare-associated pneumonia)   CHF (congestive heart failure)   Bradycardia   Acute encephalopathy   Congestive heart failure  Acute respiratory failure, hypoxic/hypercarbic: - Hypoxic secondary to pneumonia and acute CHF. -Pulmonary consult appreciated, will try to avoid BiPAP giving aspiration risk, currently on 100% nonrebreather  bag. -We'll start chest PT, flutter valve, Mucinex, nebs as needed.  Acute CHF: -Most recent echo in 2013 showing EF 55%,  Has elevated BNP. Repeat chest x-ray today showing congestive heart failure picture. - 2-D echo showing EF 60-65 percent, no diastolic dysfunction -Daily weights, strict ins and outs, will increase Lasix to 40 mg IV twice a day , -1545 since admission. [facility]-We'll consult cardiology.  Healthcare acquired pneumonia -Continue with IV vancomycin, cefepime, bronchodilators. -Follow on Legionella antigen, - negative urine strep antigen,  -Left culture growing gram-positive cocci in one bottle, patient is already on vancomycin, will await to see if coag negative or not.  Acute encephalopathy -Most likely metabolic given his pneumonia, as well secondary to hypercarbia as evident on ABG. -CT head and EEG are nonrevealing  atrial fibrillation - Had bradycardia on admission, and soft blood pressure, so Cardizem and beta blockers are being held. -On Xarelto.  Dysphagia - Continue tube feeds, patient is on Osmolite at 55 mL an hour   Code Status: DO NOT RESUSCITATE  Family Communication: Discussed with son 12/22.  Disposition Plan; remains on telemetry  Prognosis; overall anticipated poor prognosis, palliative care consult requested.  Procedures  None   Consults   - PCCM   Medications  Scheduled Meds: . antiseptic oral rinse  7 mL Mouth Rinse BID  . ceFEPime (MAXIPIME) IV  1 g Intravenous 3 times per day  . Chlorhexidine Gluconate Cloth  6 each Topical Q0600  . furosemide  40 mg Intravenous  Q12H  . insulin aspart  0-9 Units Subcutaneous Q6H  . potassium chloride  40 mEq Per Tube Q4H  . sodium chloride  3 mL Intravenous Q12H  . vancomycin  1,000 mg Intravenous Q8H   Continuous Infusions: . feeding supplement (OSMOLITE 1.2 CAL) 1,000 mL (12/23/2013 0601)   PRN Meds:.sodium chloride, acetaminophen, ondansetron (ZOFRAN) IV, sodium chloride  DVT Prophylaxis   will resume Xarelto CT head is negative.  Lab Results  Component Value Date   PLT 121* 12/29/2013    Antibiotics    Anti-infectives    Start     Dose/Rate Route Frequency Ordered Stop   01-08-14 2100  vancomycin (VANCOCIN) IVPB 1000 mg/200 mL premix     1,000 mg200 mL/hr over 60 Minutes Intravenous Every 8 hours 08-Jan-2014 1211     January 08, 2014 2000  ceFEPIme (MAXIPIME) 1 g in dextrose 5 % 50 mL IVPB     1 g100 mL/hr over 30 Minutes Intravenous 3 times per day 01-08-14 1211     2014/01/08 1115  ceFEPIme (MAXIPIME) 2 g in dextrose 5 % 50 mL IVPB     2 g100 mL/hr over 30 Minutes Intravenous  Once 01/08/2014 1106 01/08/14 1251   2014/01/08 1115  vancomycin (VANCOCIN) IVPB 1000 mg/200 mL premix     1,000 mg200 mL/hr over 60 Minutes Intravenous  Once 2014/01/08 1106 2014-01-08 1341          Objective:   Filed Vitals:   12/23/2013 0027 12/28/2013 0355 01/04/2014 0455 01/02/2014 0803  BP:  133/64    Pulse:      Temp: 98.4 F (36.9 C) 97.5 F (36.4 C)  97.7 F (36.5 C)  TempSrc: Oral   Oral  Resp:      Height:      Weight:   92.6 kg (204 lb 2.3 oz)   SpO2:        Wt Readings from Last 3 Encounters:  01/10/2014 92.6 kg (204 lb 2.3 oz)  05/04/13 89 kg (196 lb 3.4 oz)  06/24/12 111 kg (244 lb 11.4 oz)     Intake/Output Summary (Last 24 hours) at 12/28/2013 0949 Last data filed at 12/29/2013 4098  Gross per 24 hour  Intake   1300 ml  Output   3250 ml  Net  -1950 ml     Physical Exam  Awake   confused, on nonrebreather bag  [place].AT,PERRAL Supple Neck,No JVD, No cervical lymphadenopathy appriciated.  Symmetrical Chest wall movement, Good air movement bilaterally, no wheezing RRR,No Gallops,Rubs or new Murmurs, No Parasternal Heave +ve B.Sounds, Abd Soft, No tenderness, No organomegaly appriciated, No rebound - guarding or rigidity. No Cyanosis, Clubbing ,No new Rash or bruise  +1-2 edema.   Data Review   Micro Results Recent Results (from the past 240 hour(s))  Urine culture     Status:  None   Collection Time: 01/08/14 11:08 AM  Result Value Ref Range Status   Specimen Description URINE, CATHETERIZED  Final   Special Requests Normal  Final   Culture  Setup Time   Final    2014-01-08 21:31 Performed at [facility]    Colony Count NO GROWTH Performed at [facility]   Final   Culture NO GROWTH Performed at [facility]   Final   Report Status 01/02/2014 FINAL  Final  Blood Culture (routine x 2)     Status: None (Preliminary result)   Collection Time: 01/08/2014 11:31 AM  Result Value Ref Range Status   Specimen Description  BLOOD LEFT FOREARM  Final   Special Requests BOTTLES DRAWN AEROBIC AND ANAEROBIC 5 CC  Final   Culture  Setup Time   Final    2013-03-28 18:32 [facility]    Culture   Final           BLOOD CULTURE RECEIVED NO GROWTH TO DATE CULTURE WILL BE HELD FOR 5 DAYS BEFORE ISSUING A FINAL NEGATIVE REPORT [facility] at [facility]    Report Status PENDING  Incomplete  Blood Culture (routine x 2)     Status: None (Preliminary result)   Collection Time: 06/10/13 11:43 AM  Result Value Ref Range Status   Specimen Description BLOOD RIGHT HAND  Final   Special Requests BOTTLES DRAWN AEROBIC ONLY 4 CC  Final   Culture  Setup Time   Final    2013-03-28 18:32 [facility]formed at [facility]    Culture   Final    GRAM POSITIVE COCCI IN CLUSTERS Note: Gram Stain Report Called to,Read Back By and Verified With: TENEA MELTON ON 01/02/2014 AT 10:45P BY WILEJ Performed at [facility]    Report Status PENDING  Incomplete  MRSA PCR Screening     Status: None   Collection Time: 06/10/13  3:46 PM  Result Value Ref Range Status   MRSA by PCR NEGATIVE NEGATIVE Final    Comment:        The GeneXpert MRSA Assay (FDA approved for NASAL specimens only), is one component of a comprehensive MRSA colonization surveillance program. It is not intended to diagnose MRSA infection nor to guide or monitor  treatment for MRSA infections.     Radiology Reports Ct Head Wo Contrast  01/02/2014   CLINICAL DATA:  Altered mental status  EXAM: CT HEAD WITHOUT CONTRAST  TECHNIQUE: Contiguous axial images were obtained from the base of the skull through the vertex without intravenous contrast.  COMPARISON:  None.  FINDINGS: The bony calvarium is intact. Mild atrophic changes are noted commensurate with the patient's given age. No findings to suggest acute hemorrhage, acute infarction or space-occupying mass lesion are noted.  IMPRESSION: Mild atrophic changes without acute abnormality.   Electronically Signed   By: Alcide CleverMark  Lukens M.D.   On: 01/02/2014 16:45   Dg Chest Port 1 View  12/18/2013   CLINICAL DATA:  Congestive heart failure  EXAM: PORTABLE CHEST - 1 VIEW  COMPARISON:  January 01, 2014  FINDINGS: There is cardiomegaly. There is a moderate left effusion. There is generalized interstitial edema. There is patchy airspace consolidation in both lower lobes. There is no apparent adenopathy. No pneumothorax. There is a left atrial appendage clamp present.  IMPRESSION: Persistent changes of congestive heart failure. Cannot exclude superimposed pneumonia in the lower lobes. These may exist concurrently. No change in cardiac silhouette. No pneumothorax.   Electronically Signed   By: Bretta BangWilliam  Woodruff M.D.   On: 12/28/2013 07:56   Dg Chest Portable 1 View  2013-03-28   CLINICAL DATA:  Bradycardia  EXAM: PORTABLE CHEST - 1 VIEW  COMPARISON:  05/04/2013  FINDINGS: There is bilateral interstitial and alveolar airspace opacities. There is a small left pleural effusion. There is no pneumothorax. There is stable cardiomegaly. There is no acute osseus abnormality.  IMPRESSION: Overall findings concerning for CHF.   Electronically Signed   By: Elige KoHetal  Patel   On: 2013-03-28 11:53    CBC  Recent Labs Lab 06/10/13 1050 06/10/13 1100 01/06/2014 0505  WBC 9.7  --  10.9*  HGB 7.3* 8.2* 7.5*  HCT 25.9* 24.0* 26.8*  PLT  114*  --  121*  MCV 96.3  --  96.8  MCH 27.1  --  27.1  MCHC 28.2*  --  28.0*  RDW 17.2*  --  17.1*  LYMPHSABS 1.2  --   --   MONOABS 1.1*  --   --   EOSABS 0.0  --   --   BASOSABS 0.0  --   --     Chemistries   Recent Labs Lab 01-17-2014 1050 01/17/14 1100 01/02/14 0355 01/11/2014 0505  NA 137 134* 139 139  K 4.7 4.8 4.0 3.4*  CL 95* 91* 94* 89*  CO2 37*  --  41* 45*  GLUCOSE 133* 127* 109* 188*  BUN 25* 32* 28* 22  CREATININE 0.58 0.80 0.67 0.57  CALCIUM 8.5  --  9.1 8.9  AST 16  --   --   --   ALT 13  --   --   --   ALKPHOS 55  --   --   --   BILITOT 0.4  --   --   --    ------------------------------------------------------------------------------------------------------------------ estimated creatinine clearance is 87 mL/min (by C-G formula based on Cr of 0.57). ------------------------------------------------------------------------------------------------------------------ No results for input(s): HGBA1C in the last 72 hours. ------------------------------------------------------------------------------------------------------------------ No results for input(s): CHOL, HDL, LDLCALC, TRIG, CHOLHDL, LDLDIRECT in the last 72 hours. ------------------------------------------------------------------------------------------------------------------ No results for input(s): TSH, T4TOTAL, T3FREE, THYROIDAB in the last 72 hours.  Invalid input(s): FREET3 ------------------------------------------------------------------------------------------------------------------ No results for input(s): VITAMINB12, FOLATE, FERRITIN, TIBC, IRON, RETICCTPCT in the last 72 hours.  Coagulation profile  Recent Labs Lab 01/17/2014 1050  INR 1.75*    No results for input(s): DDIMER in the last 72 hours.  Cardiac Enzymes  Recent Labs Lab January 17, 2014 1050  TROPONINI <0.30    ------------------------------------------------------------------------------------------------------------------ Invalid input(s): POCBNP     Time Spent in minutes   30 minutes   ELGERGAWY, DAWOOD M.D on 01/11/2014 at 9:49 AM  Between 7am to 7pm - Pager - [phone]  After 7pm go to [email or web address] - password [email or web address]  And look for the night coverage person covering for me after hours  [facility] Office  [phone]   **Disclaimer: This note may have been dictated with voice recognition software. Similar sounding words can inadvertently be transcribed and this note may contain transcription errors which may not have been corrected upon publication of note.**

## 2014-01-13 NOTE — Progress Notes (Signed)
Patient became bradycardic, notified by CCMD, responded to room with two other RNs and patient found without pulse. Patient no code blue.  Patient family at the bedside at time of death, emotional support offered and alone time given to patient and family.

## 2014-01-13 DEATH — deceased

## 2015-05-12 IMAGING — CR DG ABDOMEN 2V
2 series · 2 of 2 positions shown · non-contrast
Comparison: DG ABD 2 VIEWS dated 03/29/2013; CT ABD/PELVIS W CM
dated 03/28/2013

CLINICAL DATA: Colonic distention.  Multiple abdominal surgeries.

EXAM:
ABDOMEN - 2 VIEW

[w abdomen decub *]
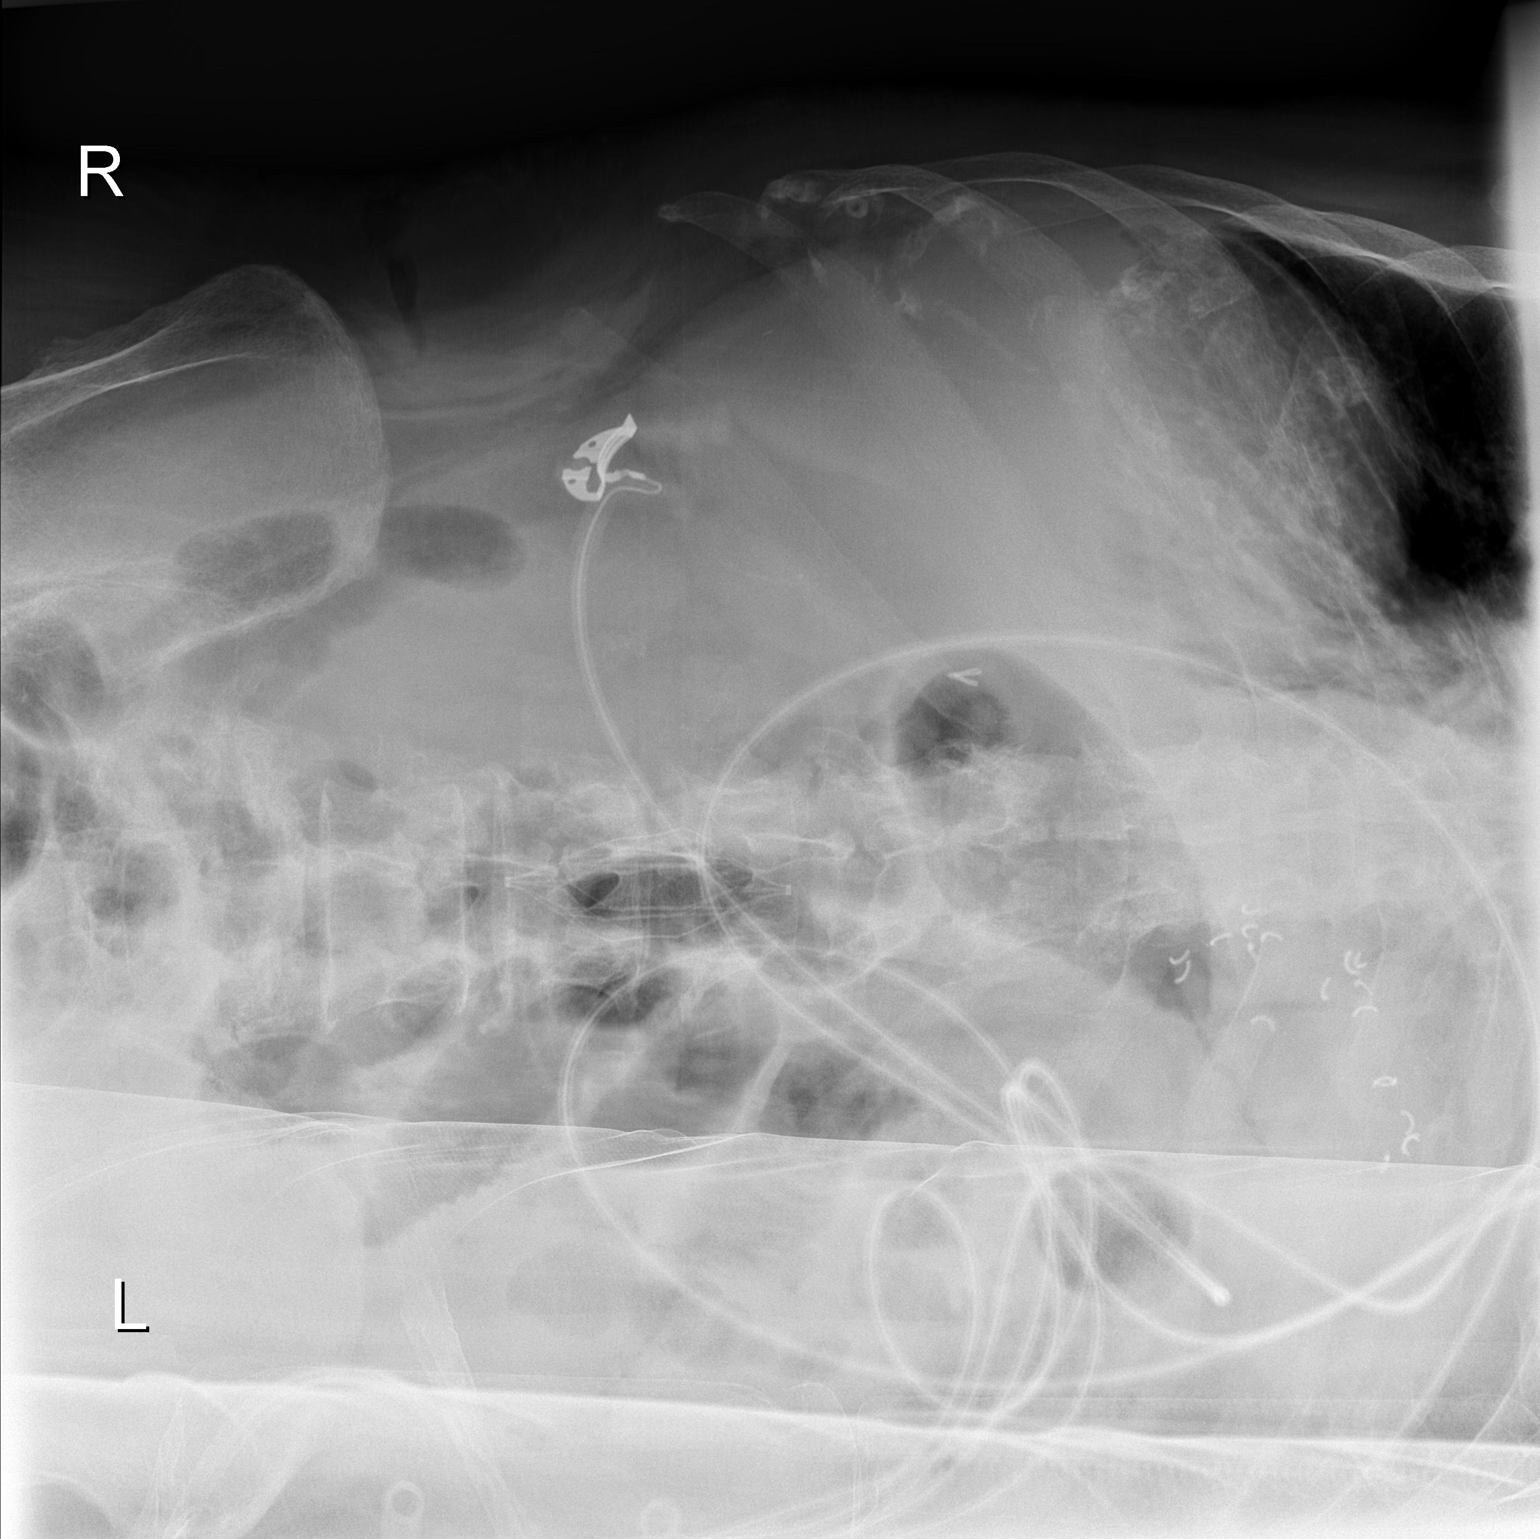

[t abdomen supine]
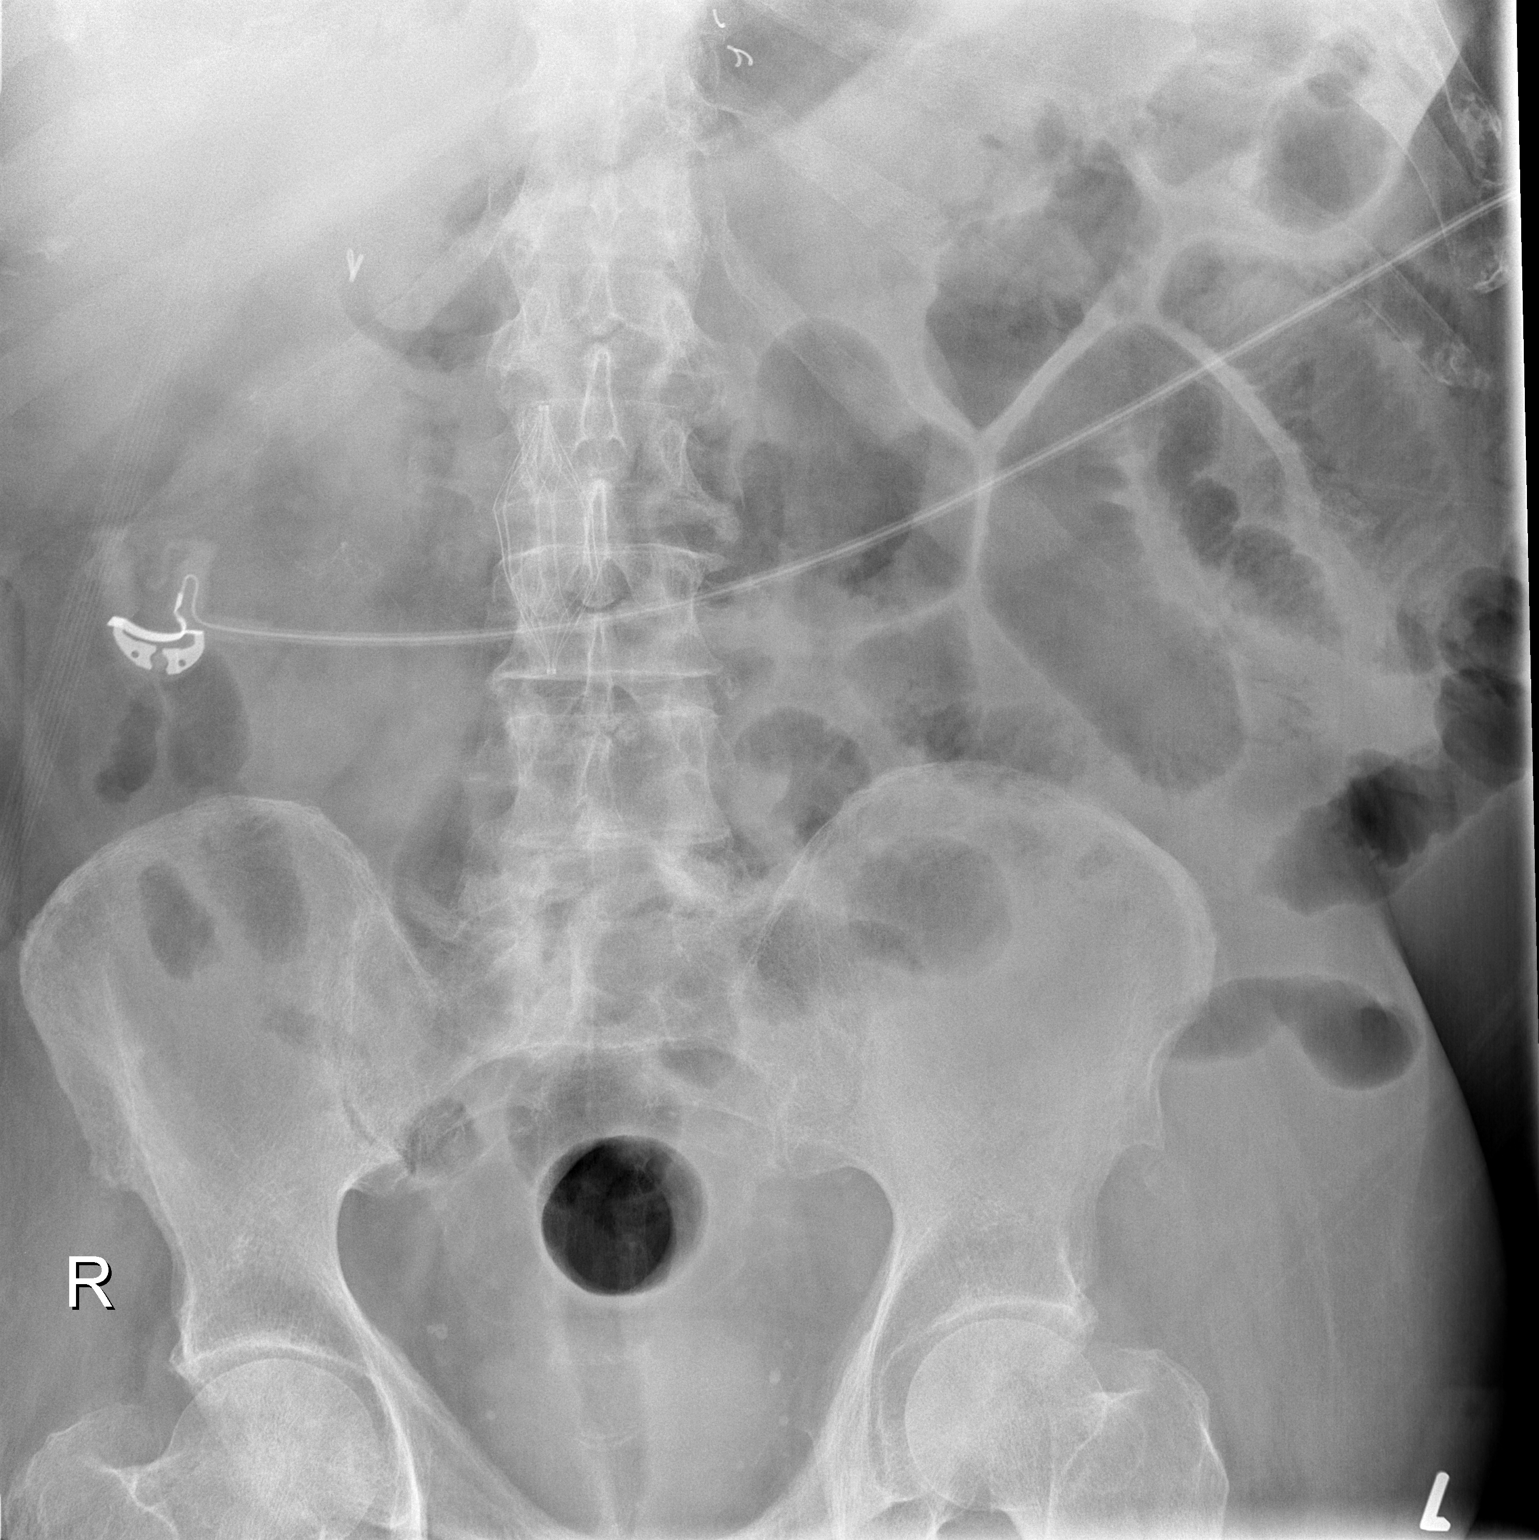

[2 of 2 positions shown; findings below may reference images not displayed]

FINDINGS: Trace blunting of the right costophrenic angle. No free
intraperitoneal gas.

Dilated loops of left upper quadrant small bowel measure up to 4 cm,
similar to the prior exam. There are several air-fluid levels within
these small bowel loops. Reduced colonic gas compared to the prior
exam. IVC filter noted.
IMPRESSION: 1. Mildly dilated loops of left upper quadrant small bowel with
scattered internal air-fluid levels, similar to the prior exam, with
abnormal but nonspecific pattern which could be due to could ileus
or partial small bowel obstruction. Correlate with bowel sounds and
bowel function.

## 2015-05-17 IMAGING — CT CT ABD-PELV W/ CM
2 of 5 series · 15 of 46 positions shown, 17 images · IV contrast (APPLIED)
Comparison: DG ABD PORTABLE 1V dated 04/04/2013;

CLINICAL DATA: Liver flight non.  Leukocytosis.

EXAM:
CT ABDOMEN AND PELVIS WITH CONTRAST
TECHNIQUE: Multidetector CT imaging of the abdomen and pelvis was performed
using the standard protocol following bolus administration of
intravenous contrast.
CONTRAST:  90mL OMNIPAQUE IOHEXOL 300 MG/ML  SOLN

[Series 2: abd/ pelvis 5.0 i30f 1 · axial · 0.93mm/px · z∈[-475,-35]mm · 12 of 100 slices shown, 14 images]
[im 6/100  soft-tissue]
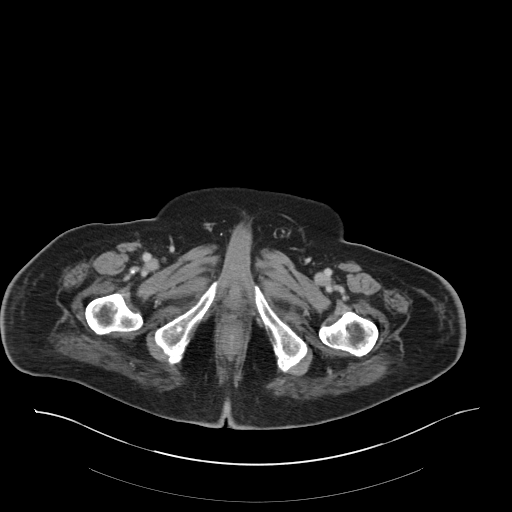
[im 6/100  bone]
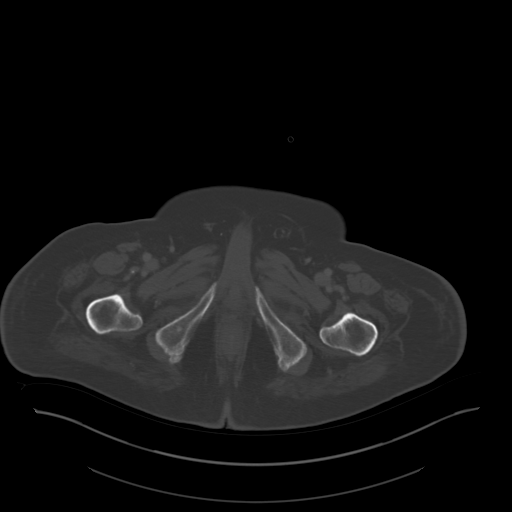
[im 18/100  soft-tissue]
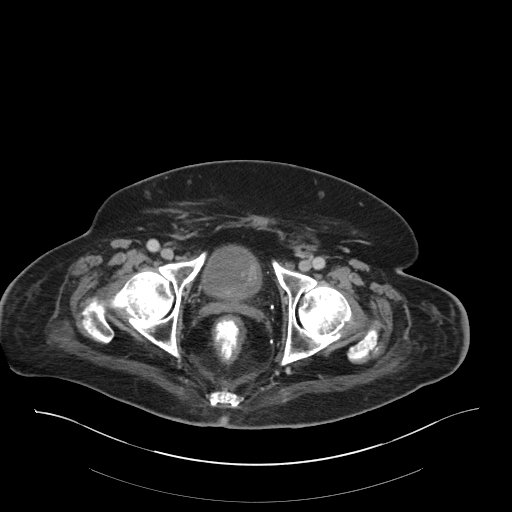
[im 24/100  soft-tissue]
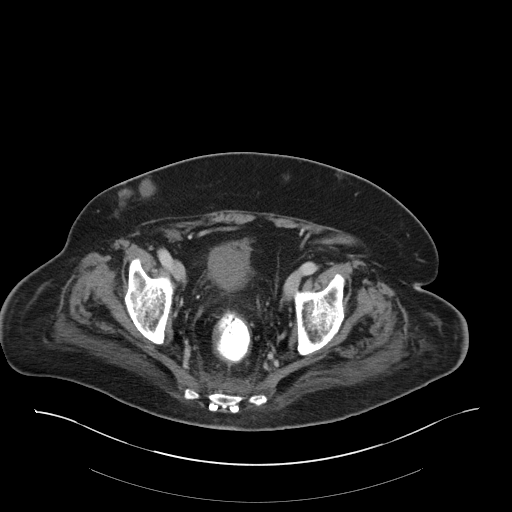
[im 30/100  soft-tissue]
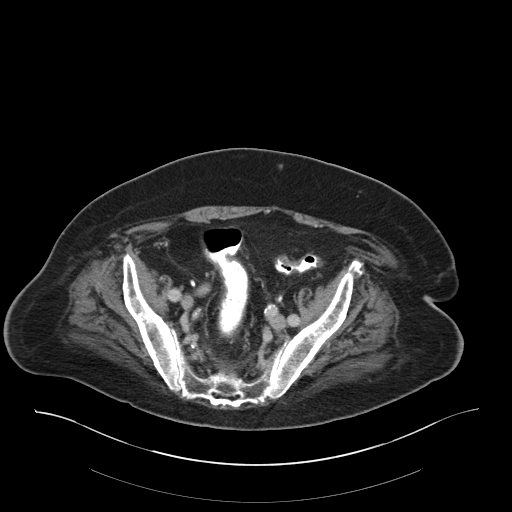
[im 41/100  soft-tissue]
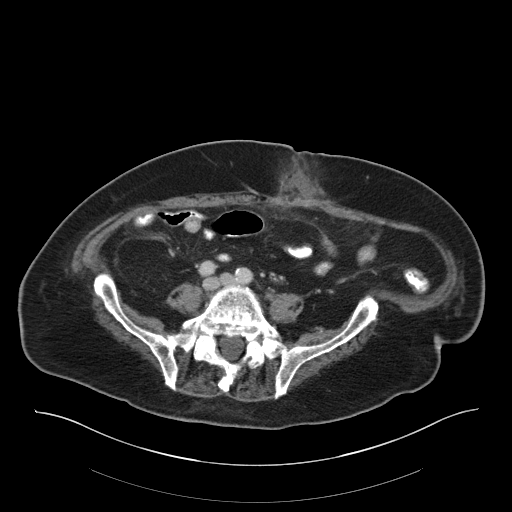
[im 47/100  soft-tissue]
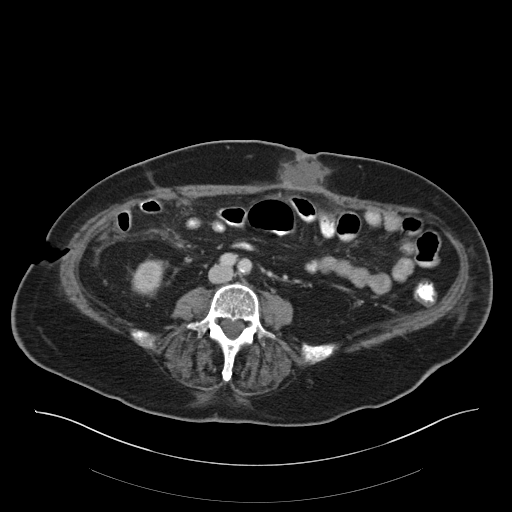
[im 53/100  soft-tissue]
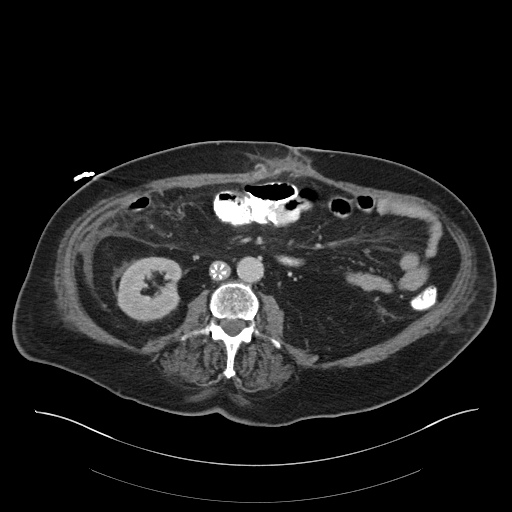
[im 65/100  soft-tissue]
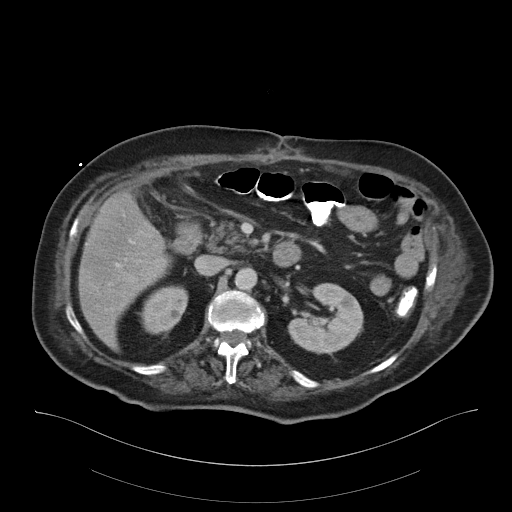
[im 70/100  soft-tissue]
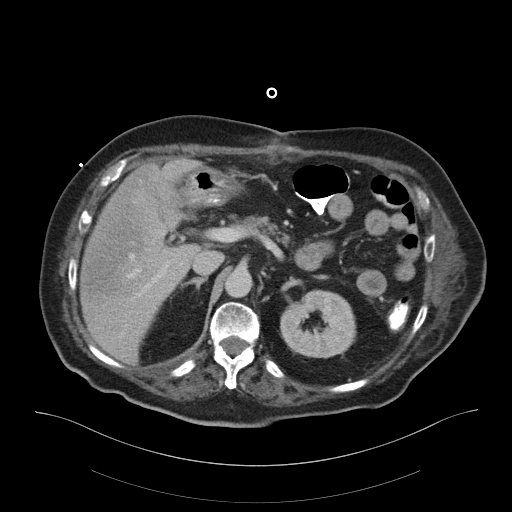
[im 70/100  bone]
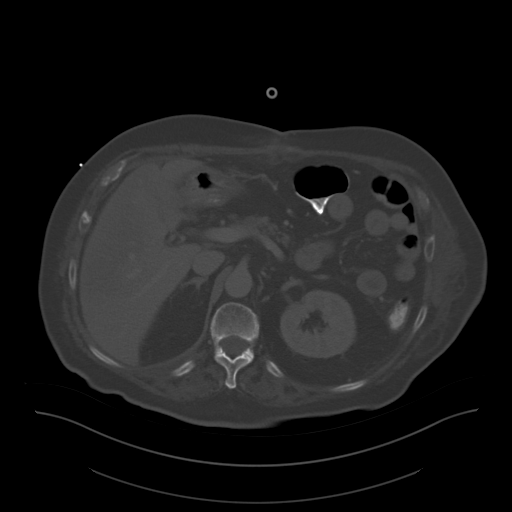
[im 76/100  soft-tissue]
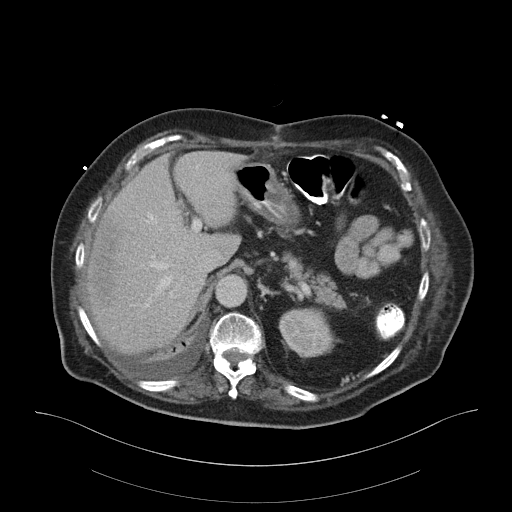
[im 88/100  soft-tissue]
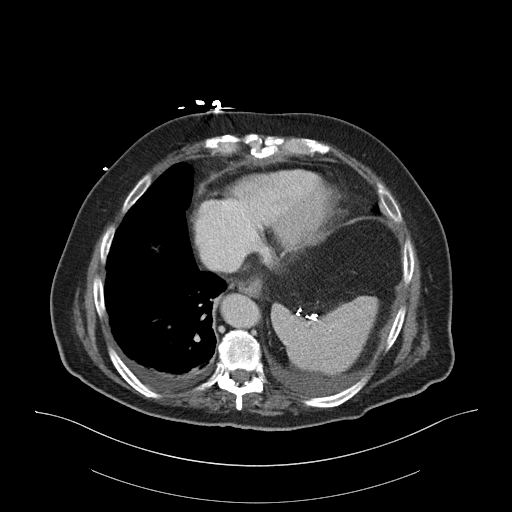
[im 94/100  soft-tissue]
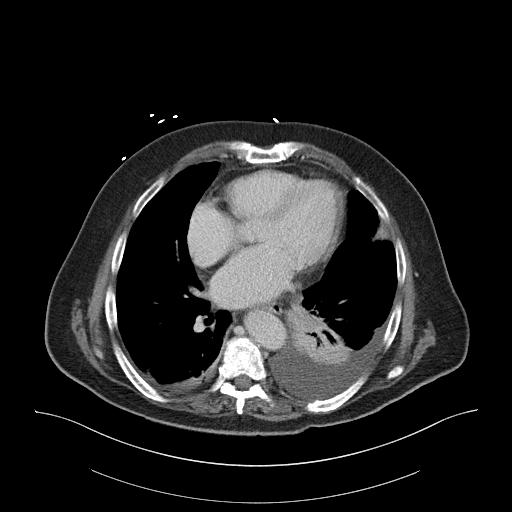

[Series 5: coronals · coronal · 0.71mm/px · 3 of 138 slices shown]
[im 46/138  soft-tissue]
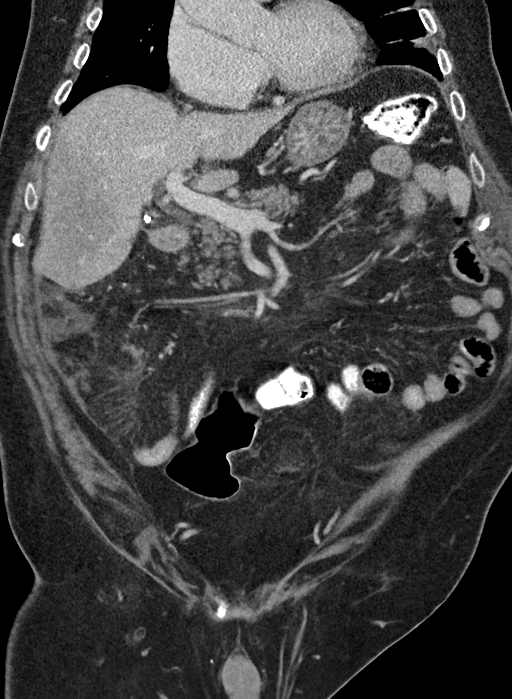
[im 61/138  soft-tissue]
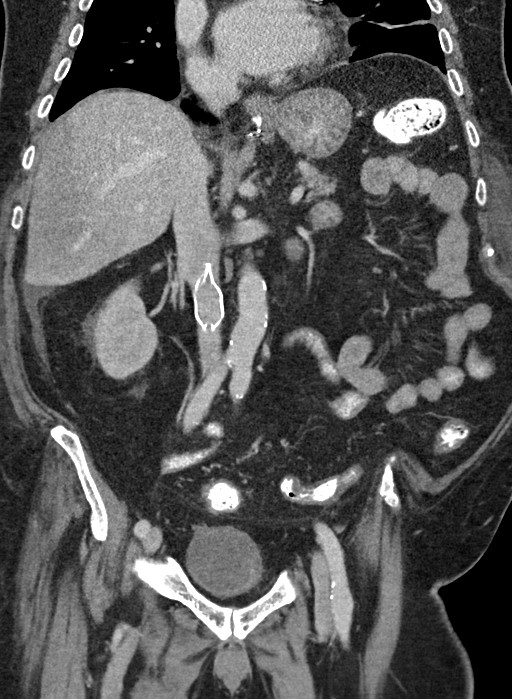
[im 77/138  soft-tissue]
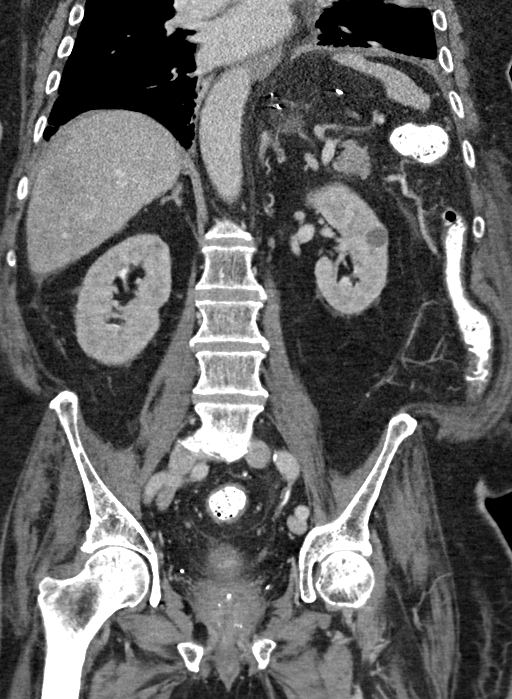

[15 of 46 positions shown; findings below may reference images not displayed]

CT ABD/PELVIS W CM
dated 03/28/2013; CT ABD-PELV W/O CM dated 03/11/2013; CT ABD/PELVIS W
CM dated 01/30/2013
FINDINGS: Stable moderate left and small right pleural effusions with
associated passive atelectasis. Mild cardiomegaly. Postoperative
findings along the gastroesophageal junction.

Slightly reduce marginal definition of the infiltrative hypodense
process involving the right hepatic lobe and portions of segment 4
of the liver.

The spleen, adrenal glands, and pancreas unremarkable. Small amount
of fluid along the gallbladder fossa and along the inferior edge of
the right hepatic lobe could, similar to prior. Gallbladder
surgically absent.

There is contrast medium in the renal collecting systems on the
initial portal venous phase images, likely due to inadvertent early
injection.

Stable renal hypodensities. Infrarenal IVC filter. Continued fluid
along the inferior margin of the laparotomy wound. Trace fluid in
the lower omentum. Stable trace presacral edema.

Bilateral chronic pars defects at L5 observed with 9 mm of
anterolisthesis.

Orally administered contrast extends through to the rectum. Mild
circumferential rectal wall thickening.

The patient seems to have had right hemicolectomy, with
reanastomosis to the small bowel on image 39 of series 5.
Outpouching from the proximal terminal margin of the remaining colon
shown on images 32 through 21 of series 5, potentially some type of
postoperative diverticulum or contained leak along the stable site.
IMPRESSION: 1. Similar distribution but reduced conspicuity of the peripheral
infiltrative hepatic hypodensity affecting the right hepatic lobe an
adjacent portion of segment 4. Fatty infiltration is the most common
cause for and infiltrative hypodensity of this type, although
localized parenchymal inflammation is not entirely excluded. MRI
could differentiate if clinically warranted.
2. Small amount of perihepatic ascites, similar to prior.
3. Hypodense renal lesions, similar to prior.
4. Patient appears to have had right hemicolectomy with small bowel
reanastomosis. There is a collection of gas and contrast extending
to the right of the proximal terminal margin of the remaining colon,
potentially a small contained leak along the staple line.
5. Continued fluid along the inferior margin of the laparotomy
wound, but without internal gas density currently.
